# Patient Record
Sex: Female | Born: 1935 | Race: White | Hispanic: No | Smoking: Former smoker
Health system: Southern US, Community
[De-identification: ages and names within clinical notes are randomized; demographics above are authoritative.]

## PROBLEM LIST (undated history)

## (undated) DIAGNOSIS — I639 Cerebral infarction, unspecified: Secondary | ICD-10-CM

## (undated) DIAGNOSIS — IMO0001 Reserved for inherently not codable concepts without codable children: Secondary | ICD-10-CM

## (undated) DIAGNOSIS — Z9289 Personal history of other medical treatment: Secondary | ICD-10-CM

## (undated) DIAGNOSIS — Z87440 Personal history of urinary (tract) infections: Secondary | ICD-10-CM

## (undated) DIAGNOSIS — M1711 Unilateral primary osteoarthritis, right knee: Secondary | ICD-10-CM

## (undated) DIAGNOSIS — K5900 Constipation, unspecified: Secondary | ICD-10-CM

## (undated) DIAGNOSIS — H9190 Unspecified hearing loss, unspecified ear: Secondary | ICD-10-CM

## (undated) DIAGNOSIS — I10 Essential (primary) hypertension: Secondary | ICD-10-CM

## (undated) DIAGNOSIS — IMO0002 Reserved for concepts with insufficient information to code with codable children: Secondary | ICD-10-CM

## (undated) DIAGNOSIS — N200 Calculus of kidney: Secondary | ICD-10-CM

## (undated) DIAGNOSIS — E7800 Pure hypercholesterolemia, unspecified: Secondary | ICD-10-CM

## (undated) HISTORY — PX: LITHOTRIPSY: SUR834

## (undated) HISTORY — PX: PEG PLACEMENT: SHX5437

## (undated) HISTORY — PX: APPENDECTOMY: SHX54

## (undated) HISTORY — PX: PEG TUBE REMOVAL: SHX2187

## (undated) HISTORY — PX: TONSILLECTOMY: SUR1361

## (undated) HISTORY — PX: OTHER SURGICAL HISTORY: SHX169

## (undated) HISTORY — PX: TYMPANOPLASTY: SHX33

## (undated) SURGERY — OPEN REDUCTION INTERNAL FIXATION HIP
Anesthesia: Choice | Laterality: Right

## (undated) SURGERY — Surgical Case
Anesthesia: *Unknown

---

## 2004-07-05 ENCOUNTER — Ambulatory Visit (HOSPITAL_COMMUNITY): Admission: RE | Admit: 2004-07-05 | Discharge: 2004-07-05 | Payer: Self-pay | Admitting: Pulmonary Disease

## 2005-07-10 ENCOUNTER — Ambulatory Visit (HOSPITAL_COMMUNITY): Admission: RE | Admit: 2005-07-10 | Discharge: 2005-07-10 | Payer: Self-pay | Admitting: Pulmonary Disease

## 2008-11-23 ENCOUNTER — Ambulatory Visit: Payer: Self-pay | Admitting: Physical Medicine & Rehabilitation

## 2008-11-23 ENCOUNTER — Inpatient Hospital Stay (HOSPITAL_COMMUNITY)
Admission: RE | Admit: 2008-11-23 | Discharge: 2008-11-27 | Payer: Self-pay | Admitting: Physical Medicine & Rehabilitation

## 2008-11-27 ENCOUNTER — Inpatient Hospital Stay (HOSPITAL_COMMUNITY): Admission: EM | Admit: 2008-11-27 | Discharge: 2008-11-30 | Payer: Self-pay | Admitting: Internal Medicine

## 2008-11-30 ENCOUNTER — Inpatient Hospital Stay (HOSPITAL_COMMUNITY)
Admission: RE | Admit: 2008-11-30 | Discharge: 2008-12-24 | Payer: Self-pay | Admitting: Physical Medicine & Rehabilitation

## 2008-12-12 ENCOUNTER — Ambulatory Visit: Payer: Self-pay | Admitting: Physical Medicine & Rehabilitation

## 2008-12-29 ENCOUNTER — Encounter (HOSPITAL_COMMUNITY)
Admission: RE | Admit: 2008-12-29 | Discharge: 2009-01-28 | Payer: Self-pay | Admitting: Physical Medicine & Rehabilitation

## 2009-01-11 ENCOUNTER — Encounter: Payer: Self-pay | Admitting: Physical Medicine & Rehabilitation

## 2009-01-20 ENCOUNTER — Encounter
Admission: RE | Admit: 2009-01-20 | Discharge: 2009-04-20 | Payer: Self-pay | Admitting: Physical Medicine & Rehabilitation

## 2009-01-21 ENCOUNTER — Ambulatory Visit: Payer: Self-pay | Admitting: Orthopedic Surgery

## 2009-01-21 DIAGNOSIS — IMO0002 Reserved for concepts with insufficient information to code with codable children: Secondary | ICD-10-CM | POA: Insufficient documentation

## 2009-01-21 DIAGNOSIS — M25469 Effusion, unspecified knee: Secondary | ICD-10-CM | POA: Insufficient documentation

## 2009-01-21 DIAGNOSIS — M25569 Pain in unspecified knee: Secondary | ICD-10-CM | POA: Insufficient documentation

## 2009-01-21 DIAGNOSIS — M1710 Unilateral primary osteoarthritis, unspecified knee: Secondary | ICD-10-CM

## 2009-01-26 ENCOUNTER — Ambulatory Visit: Payer: Self-pay | Admitting: Physical Medicine & Rehabilitation

## 2009-01-29 ENCOUNTER — Encounter (HOSPITAL_COMMUNITY)
Admission: RE | Admit: 2009-01-29 | Discharge: 2009-03-02 | Payer: Self-pay | Admitting: Physical Medicine & Rehabilitation

## 2009-02-04 ENCOUNTER — Ambulatory Visit: Payer: Self-pay | Admitting: Orthopedic Surgery

## 2009-03-04 ENCOUNTER — Encounter (HOSPITAL_COMMUNITY)
Admission: RE | Admit: 2009-03-04 | Discharge: 2009-03-04 | Payer: Self-pay | Admitting: Physical Medicine & Rehabilitation

## 2009-03-05 ENCOUNTER — Encounter (HOSPITAL_COMMUNITY)
Admission: RE | Admit: 2009-03-05 | Discharge: 2009-03-05 | Payer: Self-pay | Admitting: Physical Medicine & Rehabilitation

## 2009-03-08 ENCOUNTER — Encounter (HOSPITAL_COMMUNITY)
Admission: RE | Admit: 2009-03-08 | Discharge: 2009-04-07 | Payer: Self-pay | Admitting: Physical Medicine & Rehabilitation

## 2009-03-18 ENCOUNTER — Ambulatory Visit: Payer: Self-pay | Admitting: Physical Medicine & Rehabilitation

## 2009-04-08 ENCOUNTER — Encounter (HOSPITAL_COMMUNITY)
Admission: RE | Admit: 2009-04-08 | Discharge: 2009-05-08 | Payer: Self-pay | Admitting: Physical Medicine & Rehabilitation

## 2009-04-20 ENCOUNTER — Encounter
Admission: RE | Admit: 2009-04-20 | Discharge: 2009-06-02 | Payer: Self-pay | Admitting: Physical Medicine & Rehabilitation

## 2009-04-20 ENCOUNTER — Ambulatory Visit: Payer: Self-pay | Admitting: Physical Medicine & Rehabilitation

## 2009-05-11 ENCOUNTER — Encounter (HOSPITAL_COMMUNITY)
Admission: RE | Admit: 2009-05-11 | Discharge: 2009-06-04 | Payer: Self-pay | Admitting: Physical Medicine & Rehabilitation

## 2009-06-08 ENCOUNTER — Encounter (HOSPITAL_COMMUNITY)
Admission: RE | Admit: 2009-06-08 | Discharge: 2009-07-08 | Payer: Self-pay | Admitting: Physical Medicine & Rehabilitation

## 2009-06-30 ENCOUNTER — Encounter
Admission: RE | Admit: 2009-06-30 | Discharge: 2009-09-28 | Payer: Self-pay | Admitting: Physical Medicine & Rehabilitation

## 2009-07-02 ENCOUNTER — Ambulatory Visit: Payer: Self-pay | Admitting: Physical Medicine & Rehabilitation

## 2009-07-09 ENCOUNTER — Encounter (HOSPITAL_COMMUNITY)
Admission: RE | Admit: 2009-07-09 | Discharge: 2009-10-01 | Payer: Self-pay | Admitting: Physical Medicine & Rehabilitation

## 2009-08-26 ENCOUNTER — Ambulatory Visit: Payer: Self-pay | Admitting: Physical Medicine & Rehabilitation

## 2009-10-05 ENCOUNTER — Encounter (HOSPITAL_COMMUNITY)
Admission: RE | Admit: 2009-10-05 | Discharge: 2009-11-04 | Payer: Self-pay | Admitting: Physical Medicine & Rehabilitation

## 2009-10-13 ENCOUNTER — Ambulatory Visit: Payer: Self-pay | Admitting: Vascular Surgery

## 2009-10-13 ENCOUNTER — Encounter (INDEPENDENT_AMBULATORY_CARE_PROVIDER_SITE_OTHER): Payer: Self-pay | Admitting: Interventional Radiology

## 2009-10-13 ENCOUNTER — Ambulatory Visit (HOSPITAL_COMMUNITY): Admission: RE | Admit: 2009-10-13 | Discharge: 2009-10-13 | Payer: Self-pay | Admitting: Interventional Radiology

## 2009-10-25 ENCOUNTER — Encounter
Admission: RE | Admit: 2009-10-25 | Discharge: 2009-10-28 | Payer: Self-pay | Admitting: Physical Medicine & Rehabilitation

## 2009-10-28 ENCOUNTER — Ambulatory Visit: Payer: Self-pay | Admitting: Physical Medicine & Rehabilitation

## 2009-11-16 ENCOUNTER — Encounter (HOSPITAL_COMMUNITY)
Admission: RE | Admit: 2009-11-16 | Discharge: 2009-12-16 | Payer: Self-pay | Admitting: Physical Medicine & Rehabilitation

## 2009-12-17 ENCOUNTER — Encounter (HOSPITAL_COMMUNITY)
Admission: RE | Admit: 2009-12-17 | Discharge: 2010-01-16 | Payer: Self-pay | Admitting: Physical Medicine & Rehabilitation

## 2010-01-18 ENCOUNTER — Encounter (HOSPITAL_COMMUNITY)
Admission: RE | Admit: 2010-01-18 | Discharge: 2010-02-17 | Payer: Self-pay | Admitting: Physical Medicine & Rehabilitation

## 2010-01-21 ENCOUNTER — Ambulatory Visit: Payer: Self-pay | Admitting: Physical Medicine & Rehabilitation

## 2010-01-21 ENCOUNTER — Encounter
Admission: RE | Admit: 2010-01-21 | Discharge: 2010-04-21 | Payer: Self-pay | Admitting: Physical Medicine & Rehabilitation

## 2010-06-27 ENCOUNTER — Encounter: Payer: Self-pay | Admitting: Physical Medicine & Rehabilitation

## 2010-09-11 LAB — BASIC METABOLIC PANEL
BUN: 12 mg/dL (ref 6–23)
BUN: 9 mg/dL (ref 6–23)
CO2: 26 mEq/L (ref 19–32)
CO2: 27 mEq/L (ref 19–32)
CO2: 28 mEq/L (ref 19–32)
Calcium: 8.8 mg/dL (ref 8.4–10.5)
Chloride: 103 mEq/L (ref 96–112)
Chloride: 104 mEq/L (ref 96–112)
Creatinine, Ser: 0.81 mg/dL (ref 0.4–1.2)
GFR calc Af Amer: >60 mL/min (ref >60)
GFR calc non Af Amer: 57 mL/min — ABNORMAL LOW (ref >60)
GFR calc non Af Amer: >60 mL/min (ref >60)
Glucose, Bld: 110 mg/dL — ABNORMAL HIGH (ref 70–99)
Glucose, Bld: 120 mg/dL — ABNORMAL HIGH (ref 70–99)
Potassium: 3.3 mEq/L — ABNORMAL LOW (ref 3.5–5.1)
Potassium: 3.5 mEq/L (ref 3.5–5.1)
Sodium: 138 mEq/L (ref 135–145)
Sodium: 141 mEq/L (ref 135–145)

## 2010-09-11 LAB — GLUCOSE, CAPILLARY
Glucose-Capillary: 123 mg/dL — ABNORMAL HIGH (ref 70–99)
Glucose-Capillary: 107 mg/dL — ABNORMAL HIGH (ref 70–99)
Glucose-Capillary: 110 mg/dL — ABNORMAL HIGH (ref 70–99)
Glucose-Capillary: 114 mg/dL — ABNORMAL HIGH (ref 70–99)
Glucose-Capillary: 116 mg/dL — ABNORMAL HIGH (ref 70–99)
Glucose-Capillary: 117 mg/dL — ABNORMAL HIGH (ref 70–99)
Glucose-Capillary: 120 mg/dL — ABNORMAL HIGH (ref 70–99)
Glucose-Capillary: 120 mg/dL — ABNORMAL HIGH (ref 70–99)
Glucose-Capillary: 121 mg/dL — ABNORMAL HIGH (ref 70–99)
Glucose-Capillary: 131 mg/dL — ABNORMAL HIGH (ref 70–99)
Glucose-Capillary: 136 mg/dL — ABNORMAL HIGH (ref 70–99)
Glucose-Capillary: 151 mg/dL — ABNORMAL HIGH (ref 70–99)
Glucose-Capillary: 151 mg/dL — ABNORMAL HIGH (ref 70–99)
Glucose-Capillary: 159 mg/dL — ABNORMAL HIGH (ref 70–99)
Glucose-Capillary: 99 mg/dL (ref 70–99)

## 2010-09-11 LAB — CBC
HCT: 35.7 % — ABNORMAL LOW (ref 36.0–46.0)
Hemoglobin: 11.9 g/dL — ABNORMAL LOW (ref 12.0–15.0)
Hemoglobin: 12.3 g/dL (ref 12.0–15.0)
MCHC: 33.4 g/dL (ref 30.0–36.0)
MCHC: 33.8 g/dL (ref 30.0–36.0)
MCHC: 34.6 g/dL (ref 30.0–36.0)
MCV: 83.1 fL (ref 78.0–100.0)
MCV: 84.1 fL (ref 78.0–100.0)
Platelets: 348 10*3/uL (ref 150–400)
RBC: 4.24 MIL/uL (ref 3.87–5.11)
RBC: 4.32 MIL/uL (ref 3.87–5.11)
RDW: 12.9 % (ref 11.5–15.5)
WBC: 8.6 10*3/uL (ref 4.0–10.5)

## 2010-09-11 LAB — DIFFERENTIAL
Basophils Absolute: 0 10*3/uL (ref 0.0–0.1)
Basophils Relative: 1 % (ref 0–1)
Eosinophils Absolute: 0.3 10*3/uL (ref 0.0–0.7)
Eosinophils Relative: 3 % (ref 0–5)
Neutrophils Relative %: 71 % (ref 43–77)

## 2010-09-12 LAB — GLUCOSE, CAPILLARY
Glucose-Capillary: 115 mg/dL — ABNORMAL HIGH (ref 70–99)
Glucose-Capillary: 120 mg/dL — ABNORMAL HIGH (ref 70–99)
Glucose-Capillary: 120 mg/dL — ABNORMAL HIGH (ref 70–99)
Glucose-Capillary: 121 mg/dL — ABNORMAL HIGH (ref 70–99)
Glucose-Capillary: 123 mg/dL — ABNORMAL HIGH (ref 70–99)
Glucose-Capillary: 126 mg/dL — ABNORMAL HIGH (ref 70–99)
Glucose-Capillary: 128 mg/dL — ABNORMAL HIGH (ref 70–99)
Glucose-Capillary: 133 mg/dL — ABNORMAL HIGH (ref 70–99)
Glucose-Capillary: 136 mg/dL — ABNORMAL HIGH (ref 70–99)
Glucose-Capillary: 143 mg/dL — ABNORMAL HIGH (ref 70–99)
Glucose-Capillary: 144 mg/dL — ABNORMAL HIGH (ref 70–99)
Glucose-Capillary: 177 mg/dL — ABNORMAL HIGH (ref 70–99)
Glucose-Capillary: 92 mg/dL (ref 70–99)
Glucose-Capillary: 96 mg/dL (ref 70–99)

## 2010-09-12 LAB — DIFFERENTIAL
Basophils Relative: 1 % (ref 0–1)
Eosinophils Absolute: 0.2 10*3/uL (ref 0.0–0.7)
Eosinophils Absolute: 0.2 10*3/uL (ref 0.0–0.7)
Lymphocytes Relative: 11 % — ABNORMAL LOW (ref 12–46)
Lymphocytes Relative: 13 % (ref 12–46)
Lymphs Abs: 1.1 10*3/uL (ref 0.7–4.0)
Lymphs Abs: 1.6 10*3/uL (ref 0.7–4.0)
Monocytes Absolute: 0.9 10*3/uL (ref 0.1–1.0)
Monocytes Relative: 11 % (ref 3–12)
Monocytes Relative: 8 % (ref 3–12)
Neutro Abs: 11 10*3/uL — ABNORMAL HIGH (ref 1.7–7.7)
Neutro Abs: 5.4 10*3/uL (ref 1.7–7.7)
Neutrophils Relative %: 75 % (ref 43–77)
Neutrophils Relative %: 79 % — ABNORMAL HIGH (ref 43–77)

## 2010-09-12 LAB — COMPREHENSIVE METABOLIC PANEL
ALT: 29 U/L (ref 0–35)
Albumin: 2.7 g/dL — ABNORMAL LOW (ref 3.5–5.2)
Alkaline Phosphatase: 64 U/L (ref 39–117)
Alkaline Phosphatase: 85 U/L (ref 39–117)
BUN: 18 mg/dL (ref 6–23)
BUN: 30 mg/dL — ABNORMAL HIGH (ref 6–23)
BUN: 9 mg/dL (ref 6–23)
CO2: 27 mEq/L (ref 19–32)
Calcium: 7.8 mg/dL — ABNORMAL LOW (ref 8.4–10.5)
Calcium: 8.8 mg/dL (ref 8.4–10.5)
Creatinine, Ser: 0.65 mg/dL (ref 0.4–1.2)
Creatinine, Ser: 0.97 mg/dL (ref 0.4–1.2)
GFR calc non Af Amer: >60 mL/min (ref >60)
Glucose, Bld: 118 mg/dL — ABNORMAL HIGH (ref 70–99)
Glucose, Bld: 125 mg/dL — ABNORMAL HIGH (ref 70–99)
Potassium: 3.6 mEq/L (ref 3.5–5.1)
Sodium: 140 mEq/L (ref 135–145)
Total Protein: 5.4 g/dL — ABNORMAL LOW (ref 6.0–8.3)
Total Protein: 6.2 g/dL (ref 6.0–8.3)

## 2010-09-12 LAB — URINALYSIS, ROUTINE W REFLEX MICROSCOPIC
Glucose, UA: NEGATIVE mg/dL
Ketones, ur: NEGATIVE mg/dL
Nitrite: NEGATIVE
Protein, ur: NEGATIVE mg/dL
Protein, ur: NEGATIVE mg/dL
Specific Gravity, Urine: 1.019 (ref 1.005–1.030)
Urobilinogen, UA: 1 mg/dL (ref 0.0–1.0)
Urobilinogen, UA: 1 mg/dL (ref 0.0–1.0)

## 2010-09-12 LAB — HEMOGLOBIN AND HEMATOCRIT, BLOOD
HCT: 31.5 % — ABNORMAL LOW (ref 36.0–46.0)
HCT: 34.6 % — ABNORMAL LOW (ref 36.0–46.0)
Hemoglobin: 10.7 g/dL — ABNORMAL LOW (ref 12.0–15.0)
Hemoglobin: 10.8 g/dL — ABNORMAL LOW (ref 12.0–15.0)
Hemoglobin: 11 g/dL — ABNORMAL LOW (ref 12.0–15.0)
Hemoglobin: 11.7 g/dL — ABNORMAL LOW (ref 12.0–15.0)

## 2010-09-12 LAB — BASIC METABOLIC PANEL
BUN: 25 mg/dL — ABNORMAL HIGH (ref 6–23)
CO2: 25 mEq/L (ref 19–32)
Calcium: 8.6 mg/dL (ref 8.4–10.5)
Chloride: 104 mEq/L (ref 96–112)
Chloride: 110 mEq/L (ref 96–112)
Creatinine, Ser: 0.79 mg/dL (ref 0.4–1.2)
Creatinine, Ser: 0.84 mg/dL (ref 0.4–1.2)
GFR calc Af Amer: >60 mL/min (ref >60)
GFR calc Af Amer: >60 mL/min (ref >60)
GFR calc non Af Amer: >60 mL/min (ref >60)

## 2010-09-12 LAB — CBC
HCT: 31.3 % — ABNORMAL LOW (ref 36.0–46.0)
HCT: 32.8 % — ABNORMAL LOW (ref 36.0–46.0)
Hemoglobin: 10.6 g/dL — ABNORMAL LOW (ref 12.0–15.0)
Hemoglobin: 11.3 g/dL — ABNORMAL LOW (ref 12.0–15.0)
MCHC: 32.9 g/dL (ref 30.0–36.0)
MCHC: 33.7 g/dL (ref 30.0–36.0)
MCHC: 33.8 g/dL (ref 30.0–36.0)
MCHC: 34.5 g/dL (ref 30.0–36.0)
MCV: 82.5 fL (ref 78.0–100.0)
MCV: 83 fL (ref 78.0–100.0)
MCV: 83.6 fL (ref 78.0–100.0)
MCV: 84.1 fL (ref 78.0–100.0)
MCV: 84.7 fL (ref 78.0–100.0)
Platelets: 297 10*3/uL (ref 150–400)
Platelets: 307 10*3/uL (ref 150–400)
Platelets: 394 10*3/uL (ref 150–400)
RBC: 3.93 MIL/uL (ref 3.87–5.11)
RBC: 3.96 MIL/uL (ref 3.87–5.11)
RBC: 4.36 MIL/uL (ref 3.87–5.11)
RDW: 12.3 % (ref 11.5–15.5)
RDW: 12.6 % (ref 11.5–15.5)
WBC: 13.9 10*3/uL — ABNORMAL HIGH (ref 4.0–10.5)
WBC: 8.9 10*3/uL (ref 4.0–10.5)

## 2010-09-12 LAB — APTT: aPTT: 26 seconds (ref 24–37)

## 2010-09-12 LAB — CROSSMATCH: Antibody Screen: NEGATIVE

## 2010-09-12 LAB — PROTIME-INR
INR: 1.1 (ref 0.00–1.49)
Prothrombin Time: 13.6 seconds (ref 11.6–15.2)
Prothrombin Time: 14 seconds (ref 11.6–15.2)

## 2010-09-12 LAB — URINE MICROSCOPIC-ADD ON

## 2010-09-12 LAB — URINE CULTURE
Colony Count: NO GROWTH
Special Requests: NEGATIVE

## 2010-09-12 LAB — MAGNESIUM: Magnesium: 2.3 mg/dL (ref 1.5–2.5)

## 2010-10-18 NOTE — Discharge Summary (Signed)
[name], [name]             ACCOUNT NO.:  [id number]   MEDICAL RECORD NO.:  [id number]          PATIENT TYPE:  INP   LOCATION:  3316                         FACILITY:  [facility]   PHYSICIAN:  Hind I Elsaid, MD      DATE OF BIRTH:  April 09, 1935   DATE OF ADMISSION:  11/27/2008  DATE OF DISCHARGE:  11/30/2008                               DISCHARGE SUMMARY   The patient will be discharged to [facility].   DISCHARGE DIAGNOSES:  1. PEG site bleeding.  2. Acute blood loss anemia, status post blood transfusion secondary to      PEG site bleeding.  3. 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  4. Hypertension.  5. Left internal carotid artery stenosis with 99% occlusion and 40%      stenosis of the right internal carotid artery.  6. Hyperlipidemia.  7. History of kidney stone.  8. History of gradual hearing loss.   MEDICATIONS:  1. Crestor 20 mg p.o. at bedtime.  2. Lisinopril 10 mg daily.  3. Zetia 10 mg daily.  4. Plavix 75 mg daily.  5. Prozac 5 mg daily.  6. Lopressor 25 mg twice daily.  7. Dulcolax 10 mg as needed for constipation.  8. Maalox 30 mL as needed for indigestion.   Medication stopped, aspirin was discontinued secondary to PEG tube site  bleeding.   CONSULTATION:  Surgical consulted and Neurology consultation was done  during hospital stay.   PROCEDURE:  CT abdomen and pelvis with no acute findings in the pelvis  and no evidence of retroperitoneal hematoma, gastrostomy tube in a good  position without complicating feature, bilateral nephrolithiasis.   HISTORY OF PRESENT ILLNESS:  This is a 76 year old female who had large  left MCA infarct in [place] and was transferred to inpatient rehab  at [facility] on June 25, for physical therapy and rehab.  She had  residual aphasia, right side hemiplegia from her large left MCA infarct.  She also has 99% left internal carotid artery stenosis.   She had a PEG  tube inserted at [place] and being getting feeding tube.  During  physical therapy, the patient noted to have PEG site bleeding and  oozing.  The patient was transferred to ICU secondary to hypotension, is  status post IV fluid and blood transfusion.  All aspirin, Plavix, and  Lovenox were discontinued.  GI consulted who recommended social surgical  evaluation.  The patient was seen and examined by Surgery and no  aggressive intervention was done during hospital stay.  Neurology  consulted where they recommended to discontinue aspirin  and continue with the Plavix.  H and H continued to be monitored, okay  and feeding was started.  The patient kept under close observation with  no further bleeding.  At this time, we felt the patient is medically  stable to be transferred back.      Hind Franco Collet, MD  Electronically Signed     HIE/MEDQ  D:  11/30/2008  T:  11/30/2008  Job:  [id number]

## 2010-10-18 NOTE — H&P (Signed)
NAMEKARYSA, KASSAM NO.:  [id number]   MEDICAL RECORD NO.:  [id number]          PATIENT TYPE:  IPS   LOCATION:  4036                         FACILITY:  [facility]   PHYSICIAN:  Domenic Polite, MD     DATE OF BIRTH:  [birth year]   DATE OF ADMISSION:  11/23/2008  DATE OF DISCHARGE:  11/27/2008                              HISTORY & PHYSICAL   CHIEF COMPLAINT:  Upper GI bleed and request for transfer.   HISTORY OF PRESENT ILLNESS:  Ms. Lawwill is a 76 year old female who had  a large left MCA infarct in [place] recently and was transferred to  the inpatient rehab here at [facility] on June 21 for physical therapy  and rehab.  She has residual aphasia and right-sided hemiplegia from her  large left MCA infarct.  In addition, she was noted to have greater than  99% left internal carotid artery stenosis.  She also had her PEG tube  inserted about a week ago in [place] and had been getting PEG tube  feeds as well as recently started on p.o. diet after passing the swallow  eval.  Yesterday, she was noted to have small bright red blood oozing  from around the PEG site, which spontaneously stopped.  However, this  afternoon, while at physical therapy she had more pronounced oozing  around the PEG site, soaking about 4-5 gauzes, 4 x 4s.  In addition,  when the PEG tube was flushed with 500 mL of normal saline, 650 mL of  dark red blood was returned.  She has no history of hematemesis or  melena.  No prior GI bleeds.  Subsequently, her aspirin, Plavix, and  Lovenox were stopped this afternoon and Surgery was consulted who  subsequently placed a purse-string suture around the PEG tube site,  which has considerably decreased the bleeding from around the PEG.  In  addition, she has also had sandbags placed on her abdomen.   PAST MEDICAL HISTORY:  Hypertension, dyslipidemia, kidney stones, and  gradual hearing loss.   PAST SURGICAL HISTORY:  None.   CURRENT  MEDICATIONS:  1. Crestor 20 mg once a day.  2. Lisinopril 10 once a day.  3. Zetia 10 mg once a day.  4. Lovenox 40 mg subcu daily.  5. Plavix 75 once a day.  6. Prozac 5 mg once a day.  7. Lopressor 25 b.i.d.  8. Bethanechol 4 mg q.i.d.  9. Aspirin 81 once a day.  10.Dulcolax 10 mg as needed for constipation.  11.Maalox 30 mL as needed for indigestion.  12.Benadryl 12.5 mg as needed for itching.  13.Tylenol p.r.n.  In addition, has been getting Jevity 250 mL at bedtime.   ALLERGIES:  No known drug allergies.   SOCIAL HISTORY:  She is a retired Marine scientist.  Lives at home with her husband  who was a Immunologist here at [employer].  She is a former smoker, quit about  26 years ago, and drinks alcohol occasionally.   FAMILY HISTORY:  Significant for CVA.   REVIEW OF SYSTEMS:  12 systems reviewed are negative, except per HPI.  PHYSICAL EXAMINATION:  VITAL SIGNS:  Temperature is 98.3, pulse is 85,  blood pressure 133/81, respirations 22, sating 94% on room air.  GENERAL:  She is sleeping, arousable.  HEENT:  Pupils equal, reactive to light.  CARDIOVASCULAR:  S1, S2.  Regular rate, rhythm.  LUNGS:  Clear to auscultation bilaterally.  ABDOMEN:  With gauze and sandbag placed over the abdomen, which is soft,  nontender with positive bowel sounds.  In addition, PEG tube noted with  coffee-ground material.  EXTREMITIES:  No edema, clubbing, or cyanosis.   LABORATORY DATA:  CBC:  Hemoglobin 12.1, white count 13, platelets 394.  Chemistries within normal limits.  PT 14, INR 1.1, PTT is 26.   ASSESSMENT AND PLAN:  This is a 76 year old female with recent large  left middle cerebral artery infarct with:  1. Upper gastrointestinal bleed.  We will transfer her to telemetry      bed.  Unclear if the bleeding was from the surgical site versus      mucosal upper GI bleeding, is status post purse-string sutures per      surgery.  We will type and cross packed red blood cells.  Check H      and H q.8 h.   Transfuse to keep hemoglobin greater than 10.  Start      IV Protonix 40 mg IV b.i.d.  In addition, get large bore IV access      as well.  Continues to bleed, will likely need an EGD.  GI has      already been consulted and hold aspirin, Plavix, Lovenox for now      and hold tube feeds and keep the patient n.p.o. and transfer to the      inpatient telemetry bed.  2. Left large middle cerebral artery infarct related to right      hemiparesis and aphasia.  Hold aspirin, Lovenox for now secondary      to the bleeding; 99% left internal carotid artery occlusion, for      reevaluation in 3-4 weeks for possible carotid endarterectomy.      Neurology, Dr. Leonie Man, is following the patient.  3. Hypertension.  Hold lisinopril.  Continue Lopressor with holding      parameters.  4. Deep venous thrombosis prophylaxis.  PAS stockings.  5. Code status.  The patient is a full code.      Domenic Polite, MD  Electronically Signed     PJ/MEDQ  D:  11/27/2008  T:  11/28/2008  Job:  [id number]

## 2010-10-18 NOTE — H&P (Signed)
NAMEDAZARIA, ROSENBAUER NO.:  [id number]   MEDICAL RECORD NO.:  [id number]          PATIENT TYPE:  IPS   LOCATION:  [id number]                         FACILITY:  [facility]   PHYSICIAN:  Meredith Staggers, M.D.DATE OF BIRTH:  1934-12-20   DATE OF ADMISSION:  11/30/2008  DATE OF DISCHARGE:                              HISTORY & PHYSICAL   NEUROLOGIST:  Pramod P. Leonie Man, MD   GASTROENTEROLOGIST:  Tory Emerald. Benson Norway, MD   CHIEF COMPLAINT:  Right-sided weakness and aphasia.   HISTORY OF PRESENT ILLNESS:  This is a 76 year old white female admitted  initially on November 23, 2008 with extensive left basal ganglia infarct and  aphasia.  Please see details of my prior admission history and physical  for the history prior to rehab stay.  The patient was started on a D2  diet while on rehab with thin liquids on November 26, 2008.  The patient  developed bleeding from her PEG site on November 28, 2008 and was noted to  have coffee-ground/dark red blood tinged fluid from the PEG.  The area  was flushed initially.  GI and General Surgery was consulted and Surgery  placed a pursestring suture around the PEG, which decreased the  bleeding.  CT of the abdomen and pelvis was without evidence of  retroperitoneal bleeding or peritoneal bleeding.  The patient was  transferred to Telemetry for closer monitoring.  She did  require 2  units of packed red blood cells due to the blood loss.  The patient's  bleeding has since resolved, and H&H has been stable at mid 11 to 61.  Today, she was transferred back to rehab to resume her rehab therapies.  General Surgery now recommends aspirin or Plavix and no Lovenox for her  as prophylaxis.   REVIEW OF SYSTEMS:  Notable for some weakness and joint swelling.  Otherwise, review of systems items are unavailable due to patient's  language deficits.   Past medical history, family history, social history are all the same  from prior H&P.   ALLERGIES:  None.   HOME  MEDICATIONS:  Zestril and Crestor.   LABORATORY DATA:  Hemoglobin 11.1, white count 8.9, and platelets 297.  UA is positive 11-20 red blood cells, many bacteria, and white blood  cells were too numerous to count.   PHYSICAL EXAMINATION:  VITAL SIGNS:  Blood pressure is 131/74, pulse 61,  respiratory rate 20, temperature 98.0.  GENERAL:  The patient is pleasant.  HEENT:  Pupils equally round and reactive to light.  Nose and throat  exam is unremarkable with fair dentition.  NECK:  Supple without JVD or lymphadenopathy.  CHEST:  Clear to auscultation bilaterally without wheezes, rales, or  rhonchi.  HEART:  Regular rate and rhythm without murmur, rubs, or gallops.  ABDOMEN:  Soft, nontender.  Bowel sounds are positive.  No bleeding is  seen at or around the PEG site.  SKIN:  Generally intact except for above.  NEUROLOGICAL:  Cranial nerves II-XII show extensive right central VII  and tongue deviation, poor oral motor control.  She is  aphasic, more  expressive than  receptive.  Sensation is grossly intact to pinprick and  pain on the right.  Still has some inattention to the right.  In  general, strength is minimal to absent in the right arm and leg today,  at best 1/5 perhaps in the leg.  Left arm and leg move spontaneously and  generally in the 4/5 to 5/5 range.  Judgment, orientation, memory, and  mood are all abnormal due to language deficits.   POST-ADMISSION PHYSICIAN EVALUATION:  1. Functional deficits secondary to left basal ganglia infarct      secondary to left ICA stenosis.  Course complicated by bleed around      PEG.  2. The patient is admitted to receive collaborative interdisciplinary      care between the physiatrist, rehab, nursing staff, and therapy      team.  3. The patient's level of medical complexity and substantial therapy      needs in context of that medical necessity cannot be provided at a      lesser intensity of care.  4. The patient has experienced  substantial functional loss from her      baseline.  Upon functional assessment at the time of preadmission      screening, the patient was max assist for basic mobility and self-      care, max basic mobility, and max-to-total assistance for basic      self-care.  Premorbidly, she was independent.  Currently, the      patient is around max assist for mobility and max assist for ADLs.      Judging by the patient's diagnosis, physical exam, and functional      history, she has  potential for functional progress which will      result in measurable gains while in inpatient rehab.  These gains      will be of substantial practical use upon discharge to home with      her husband in facilitating mobility and self-care.  Interim      changes since our initial document are detailed above.  5. Physiatrist will provide 24-hour management of medical needs as      well as oversight of therapy plan/treatment and provide guidance as      appropriate regarding interaction of the two.  Medical problem list      and plan are listed below.  6. The 24-hour rehab nursing will assist in management of the      patient's bowel and bladder function as well as skin care, PEG site      care, nutrition, medication administration, integration of therapy      concepts and techniques, etc.  7. PT will assess and treat for lower extremity strength, tone, and      range of motion and control, neuromuscular facilitation,      reeducation, functional ability, family education, and goals of min      assist.  The patient will primarily be at a wheelchair level.  8. OT will assess and treat for upper extremity use including adaptive      techniques and equipment, neuromuscular reeducation, family      education, safety awareness with goals of min to occasional mod      assist at a wheelchair level.  9. Speech and Language Pathology will assess and treat for dysphagia      and aphasia with goals modified independent with  feeding and      supervision to min assist with language.  10.Case management social worker will assess and treat for      psychosocial issues and discharge planning.  11.Team conferences will be held weekly to assess progress towards      goals and to determine barriers at discharge.  12.The patient has demonstrated sufficient medical stability and      exercise capacity to tolerate at least 3 hours of therapy per day      at least 5 days per week.  13.Estimated length of stay is approximately 3 weeks.  Prognosis is      fair to good.   MEDICAL PROBLEM LIST AND PLAN:  1. Fluid, electrolytes, and nutrition:  Resume D2 thin, liquid diet.      Tube feeds will be given initially and then will stop.  Consider      resuming depending on caloric intake by mouth.  2. PEG site bleed:  H&H is stable.  Will refrain from aspirin.      Continue Plavix 75 mg p.o. daily.  3. DVT prophylaxis with SCDs.  4. Anemia:  The patient's hemoglobin stabilized around 11.  We will      follow hemoglobins serially and avoid Lovenox as above.  5. UTI:  The patient placed on IV Fortaz initially for questionable      sepsis.  We will discontinue and recheck      another UA and C&S.  Placed the patient on Cipro.  6. Dyslipidemia:  Crestor.  7. Skin care:  Frequent turning and decubitus precautions.      Meredith Staggers, M.D.  Electronically Signed     ZTS/MEDQ  D:  11/30/2008  T:  12/01/2008  Job:  [id number]   cc:   Pramod P. Leonie Man, MD  Tory Emerald Benson Norway, MD

## 2010-10-18 NOTE — Discharge Summary (Signed)
[name], [name] NO.:  [id number]   MEDICAL RECORD NO.:  [id number]          PATIENT TYPE:  IPS   LOCATION:  4036                         FACILITY:  [facility]   PHYSICIAN:  [name], M.D.DATE OF BIRTH:  1935/03/04   DATE OF ADMISSION:  11/30/2008  DATE OF DISCHARGE:  12/24/2008                               DISCHARGE SUMMARY   DISCHARGE DIAGNOSES:  1. 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  2. Dysphagia resolved.  3. Acute blood loss anemia improved.  4. Dyslipidemia.  5. Urinary retention resolved.   HISTORY OF PRESENT ILLNESS:  [name] is a 76 year old female  initially admitted 06/21 with extensive left basal ganglia infarct with  right hemiparesis and aphasia, see prior H&P for full details.  The  patient was started on D2 diet while on rehab with thin liquids on June  24, she developed some bleeding from trach site on June 26, was noted to  have coffee ground aspirate, tinge blood aspirated from PEG and  evaluated by GI and General Surgery.  General surgery placed a  pursestring suture around the PEG with decrease in bleeding.  CT  abdomen, pelvis done showed no evidence of retroperitoneal bleeding of  peritoneal bleeding.  The patient was transferred to telemetry for  closer monitoring.  She was transfused with 2 units packed red blood  cells for acute blood loss anemia.  The patient's H&H has been monitored  and has been stable at mid 11-12 range.  Bleeding has resolved and she  is transferred back to rehab on 06/28 for her continue her CIR program.  General surgery recommends aspirin or Plavix and no Lovenox for DVT  prophylaxis to prevent recurrent bleeding.   PAST MEDICAL HISTORY:  Significant for hypertension, dyslipidemia,  impaired hearing, renal calculi and OA, right knee.   ALLERGIES:  No known drug allergies.   FAMILY HISTORY:  Positive for CVA.   SOCIAL HISTORY:  The patient is married, is a retired  Marine scientist, independent  and active prior to admission, lives in two-level home with six steps at  entry.  Quit tobacco times 26 years.  Uses alcohol once a week.   FUNCTIONAL HISTORY:  The patient was independent prior to admission.   FUNCTIONAL STATUS:  The patient with impairments and mobility and self-  care.  Currently with resumption of CIR Program to take place.   PHYSICAL EXAMINATION:  VITALS:  Blood pressure 131/74, pulse 61,  respiratory rate 20, temperature 98.0  GENERAL:  The patient is pleasant female alert, in no acute distress.  HEENT:  Pupils equal, round, reactive to light.  Oral exam shows fair  dentition.  Hearing is reduced.  NECK:  Supple without JVD or lymphadenopathy.  CHEST:  Clear to auscultation bilaterally without wheezes, rales or  rhonchi.  HEART:  Regular rate rhythm without murmurs or gallops.  ABDOMEN:  Soft, nontender with positive bowel sounds.  No bleeding seen  around PEG site.  SKIN:  Intact.  NEUROLOGIC: Cranial nerves II-XII show extensive central VII with tongue  deviation, poor oral motor control.  The patient is  aphasic, more  expressive than receptive, sensation is grossly intact to pin prick and  pain still has some inattention to write.  Strength is minimal to  absent, right arm and right leg, 1/5 perhaps in leg the left arm and  left leg was spontaneously generally 2/5 to 5/5.  Judgment orientation,  memory and mood are abnormal due to language deficits.   HOSPITAL COURSE:  [name] was readmitted to rehab on November 30, 2008, for inpatient therapies to consist of PT, OT, speech therapy at  least 3 hours 5 days a week.  Past admission, physiatrist rehab RN and  therapy team have worked together to provide customized collaborative  interdisciplinary care.  The patient's diet was resumed and tube feeds  were deceased the past admission.  The patient was advanced to D2 diet  thin liquids and has been tolerating this without difficulty.   Labs done  past admission revealed hemoglobin 11.3, hematocrit 32.8, white count  8.5, platelets 303.  Check of lytes revealed, sodium 142, potassium 3.3  chloride 106, CO2 27, BUN 9, creatinine 0.65, glucose 118.  The patient  was started on potassium supplements for mild hypokalemia, question due  to IV fluids as well as poor p.o. intake.  Last check of lytes of July  12 show hypokalemia to have resolved with sodium 141, potassium 3.5,  chloride 105, CO2 20, BUN 12, creatinine 0.96, glucose 105.  Hemoglobin  A1c checked past admission is 5.4.  Initially CBGs were checked on a.c.  h.s. basis.  Blood sugars overall show glucose intolerance ranging at  120-130 range.  Currently the patient is on a regular diet and is to  follow up with LMD regarding monitoring of blood sugars and hemoglobin  A1c on routine basis.  The patient's blood pressures have been monitored  on b.i.d. basis.  These have ranged from [id number] systolic, [id number] to 123XX123  diastolic.  Heart rate has been stable in 60s to 70s range.  The  patient's Foley was DC on 06/29 and voiding trial was resumed.  The  patient was started on Urecholine to help initiate voiding.  She has  been toileted by rehab RN q.6 h to see if this could help trigger her  bladder.  Initially the patient required some in-and-out caths.  However, with improvement in mobility as well as addition of Flomax, the  patient's voiding has improved without any evidence of retention.  Last  PVRs checked were noted to be at 60s to 150 range.  Currently the  patient is continent bowel and bladder with rehab RN, toileting patient  q.4 h while awake.  As the patient's p.o. intake was noted to be good,  General Surgery was contacted regarding removal of suture from PEG site.  This was done on 07/20 without difficulty.  PEG was discontinued on  07/20 1:00 a.m.Marland Kitchen  Area does show some minimal amount of dried blood on  dressing.  The patient to continue with dry dressing on site  until the  drainage fully resolved.  The patient has had some improvement in right-  sided weakness.  Currently she is noted to have -2 out of 5 strength  right hand and right foot.  Verbal output is improving.   During the patient's stay in rehab physiatrist rehab RN and therapy team  have worked together to provide customized collaborative  interdisciplinary care.  Weekly team conferences were held to monitor  the patient's progress, set goals as well as discuss  barriers to  discharge.  Portion was noted to be limited initially by right  inattention, apraxia, right hemiparesis as well as some tone in right  lower extremity decreasing impacting her functional mobility.  The  patient has progressed along well with therapy, achieving supervision  for lateral school.  She is min assist for sit to stand, min assist for  ambulating 154 feet with rolling walker, min assist to navigate to  upstairs.  Tone is much improved.  However, some extensor tone persist.  Motor apraxia has resolved.  The patient's husband has been very  supportive and has been here to be present for most of her therapy  sessions.  Family education was done with husband to include all  mobility as well as wheelchair, parts management, car transfers as well  as routine transfers.  The patient was limited and self-care by decrease  in static and dynamic standing balance, decreased endurance, decrease in  selective sustained attention as well as problems with motor planning  and poor safety.  The patient has made excellent progress progressing  from max assist for self care to being at min assist overall.  She is  min assist for bathing and lower body dressing.  She requires setup for  upper body dressing and min assist for toileting.  Speech has been  working with the patient on improving her consistency in responding to  yes or  no questions.  They have also been working with melanotic  intonation to help verbalize one to  two word utterances as well as  improving her comprehension of her written short phrase information.  The patient has progressed along to expressing her wants and needs with  gestures as well as written cues and verbal approximation was  supervision.  She is able to utilize word, phrase, cues to increase  accuracy with yes, no auditory comprehension.  She is able to copy words  and letters, to fill in missing letters, to complete words with mod to  max cues.  She is able to identify words and phrases to match letters to  approximate [facility] and object with cues to correct and check for errors  in the field of 3 to 4 at 80-90% accuracy.  The patient and spouse  education was done regarding the patient's current needs for multimodel  and verbal communication.  He has also been educated on home program to  practice maximizing communication interaction to help the patient  expressed her wants and needs.  The patient will continue to receive  further follow-up outpatient PT, OT, speech therapy [facility]  to begin first part of August.  On December 24, 2008 the patient is  discharged to home.   DISCHARGE MEDICATIONS:  1. Lopressor 25 mg b.i.d.  2. Zetia 10 mg a day.  3. Plavix 75 mg a day.  4. Crestor 20 mg a day.  5. Protonix 40 mg a day.  6. Multivitamin one per day.  7. Senokot-S two p.o. q.h.s.   DIET:  Balanced.   ACTIVITY:  Level is 24-hour supervision.  No strenuous activity.  No  alcohol, no smoking, no driving.   FOLLOWUP:  The patient to follow up with Dr. Letta Pate on August 24 at  10:00 a.m. to 10:30 appointment.  Follow up with Dr. Leonie Man in 1 month.  Follow up with LMP in 2-3 weeks.      Thornton Dales, P.A.      [name], M.D.  Electronically Signed  PP/MEDQ  D:  12/24/2008  T:  12/25/2008  Job:  [id number]   cc:   Pramod P. Leonie Man, MD  [facility] [place], [place]. Dalbert Batman, M.D.

## 2010-10-18 NOTE — Consult Note (Signed)
NAMEGALILEA, RECLA             ACCOUNT NO.:  [id number]   MEDICAL RECORD NO.:  [id number]          PATIENT TYPE:  IPS   LOCATION:  4036                         FACILITY:  [facility]   PHYSICIAN:  Pramod P. Leonie Man, MD    DATE OF BIRTH:  11-Mar-1935   DATE OF CONSULTATION:  DATE OF DISCHARGE:                                 CONSULTATION   CHIEF COMPLAINT:  Right-sided weakness, aphagia, and dysphagia.   HISTORY OF PRESENT ILLNESS:  This is a 76 year old white woman with risk  factors of hypertension, hyperlipidemia, and history of tobacco abuse.  She was visiting family in [place] on November 16, 2008, that is about  8 days ago with her husband.  She was apparently alright until she went  to bed on that day.  When she woke up, husband noticed sudden onset  right-sided weakness and aphasia.  She was evaluated in [facility] and was deemed not suitable for thrombolysis.  She was found to  have diffuse left-sided infarction.  She was transferred to [facility] by ambulance yesterday and admitted for rehab.  Since November 16, 2008, she has had persistent deficits.  She is able to swallow some now  and has some phonation, but there is no meaningful conversation.  She  does have some right-sided movement, but seems still very weak.   ALLERGIES:  No known allergies.   PAST MEDICAL HISTORY:  1. Decreased hearing bilaterally.  2. Osteoarthritis of right knee  3. Hypertension.  4. Hyperlipidemia.  5. Kidney stones.   CURRENT MEDICATIONS:  1. Aspirin 81.  2. Plavix 75.  3. Zetia 10.  4. Crestor 20.  5. Lisinopril 10.  6. Metoprolol 25 twice a day.  7. Lovenox subcutaneous daily.  8. Prozac.   SOCIAL HISTORY:  Lives with husband.  She is a retired Marine scientist.  History  of tobacco abuse, quit about 26 years ago.  No history of alcohol or  drug abuse.   FAMILY HISTORY:  She has a family history of CVA.   REVIEW OF SYSTEMS:  Negative except per HPI.   PHYSICAL EXAMINATION:   VITAL SIGNS:  Temperature 98.9, heart rate 71,  respiratory rate 18, and blood pressure 131/77.  GENERAL:  Not in any distress, sitting on a wheelchair.  HEENT:  Normocephalic and atraumatic.  Extraocular movements are intact.  Pupils are equal, round, and reactive to light.  NECK:  Supple.  No carotid bruits.  CHEST:  Clear to auscultation bilaterally.  CARDIOVASCULAR:  Regular rate and rhythm with normal heart sounds.  ABDOMEN:  Soft, nontender, and nondistended.  EXTREMITIES:  No cyanosis, clubbing, or edema.  NEURO:  Alert but aphasic.  Right-sided facial droop.  Motor exam 2-3/5  on the right side, left side 4/5.  Reflexes symmetrical but diminished  in lower extremities.  Babinski is upgoing bilaterally.  Unable to  assess cerebellar and sensory.   LABORATORY DATA:  1. CT of heat on November 16, 2008, left MCA stroke with old left carotid      stroke.  2. MRI of brain, extensive left basal ganglia  infarction, left      temporal lobe involving two-third.  No flow noticed in left ICA and      left MCA.  3. Carotid Doppler, right ICA 79% and left ICA 80% stenosis.  4. Hemoglobin 11.8, WBC 8.3, and platelet 207.  LFT normal except for      albumin of 2.7, sodium 140, potassium 3.6, chloride 106,      bicarbonate 26, BUN 18, creatinine 0.8, and blood glucose 100.   ASSESSMENT AND PLAN:  This is a 76 year old lady with recent large left  middle cerebral artery infarction from left middle cerebral artery clot  and possible embolization from the proximal left internal carotid artery  80% stenosis.  She is globally aphasic and has some right-sided visual  field cut and right-sided facial weakness.  Her MRI has shown large left  temporal and basal ganglia infarct.  She noted to have bilateral carotid  stenosis up to about 80%.   She will basically need secondary risk factor prevention.  We agree with  continue with Plavix and aspirin for now.  Her Plavix might have to be  placed on hold  if she goes for any intervention.  We agree with  continuing with Zetia and Crestor for her hyperlipidemia.  We agree with  correcting her blood pressure with lisinopril and metoprolol.  We  recommend getting a cerebral angiogram to assess blood supply to the  brain.  Following cerebellar angiogram, we will consider a vascular  consult for endarterectomy.  We agree with continuing with PT, OT, and  rehab at this time.  Swallow evaluation is to be done if it is not  already done.   Thank you very much for this interesting consult.  We will follow along  closely with you.      Dawna Part, MD  Electronically Signed     ______________________________  Kathie Rhodes. Leonie Man, MD    AS/MEDQ  D:  11/24/2008  T:  11/25/2008  Job:  [id number]

## 2010-10-18 NOTE — H&P (Signed)
[name], [name] NO.:  [id number]   MEDICAL RECORD NO.:  [id number]          PATIENT TYPE:  IPS   LOCATION:  [id number]                         FACILITY:  [facility]   PHYSICIAN:  Meredith Staggers, M.D.DATE OF BIRTH:  1934-07-05   DATE OF ADMISSION:  11/23/2008  DATE OF DISCHARGE:                              HISTORY & PHYSICAL   CHIEF COMPLAINT:  Aphasia and right-sided weakness.   HISTORY OF PRESENT ILLNESS:  This is a 76 year old previously active  white female who was in [place] visiting family when on June 14, she  developed right-sided weakness and aphasia.  CAT scan of the brain  showed hyperdense signal in left MCA territory consistent with acute  thrombus and an old left caudate infarct.  MRI of the brain showed  extensive infarct in left basal ganglia and two-thirds left temporal  lobe in circle of Willis.  No flow was seen in the left ICA or left MCA  in its M1 and M2 segments.  Carotid Dopplers showed 79% right ICA  stenosis and 80% left ICA.  The patient with persistent global aphasia  and dysphagia as well as dense right hemiparesis.  She had a PEG tube  placed during this hospital stay by GI and has been tolerating feedings  fairly well.  The patient continues to have significant deficits with  mobility and self care and it was felt that she potentially benefit from  inpatient admission and thus she is brought via air ambulance here  today.   REVIEW OF SYSTEMS:  Unable to be obtained due to patient's language  deficits.  She does not appear to be in any gross pain at this point.   PAST MEDICAL HISTORY:  Notable for decreased hearing with long-term  gradual hearing loss.  The patient wears hearing aid.  There is  osteoarthritis of right knee, hypertension, dyslipidemia, and kidney  stones.   FAMILY HISTORY:  Positive for CVA.   SOCIAL HISTORY:  The patient is a retired Marine scientist.  She quit tobacco 26  years ago and rarely drinks.  She has a 2-level  house with 6 steps to  enter.  Bedroom is on the first floor.   ALLERGIES:  None.   HOME MEDICATIONS:  Zestril and Crestor.   PHYSICAL EXAMINATION:  GENERAL:  The patient is pleasant, no acute  stress.  She is alert and unable to orient due to language.  HEENT:  Pupils equal, round, and reactive to light.  Ear, nose, and  throat exam is notable for intact to fair dentition and mucosa is pink  and moist.  NECK:  Supple.  No JVD or lymphadenopathy.  CHEST:  Clear to auscultation bilaterally without any wheezes, rales, or  rhonchi.  HEART:  Regular rate and rhythm without murmur, rubs, or gallop.  EXTREMITIES:  No clubbing, cyanosis, or edema.  ABDOMEN:  Notable for PEG tube which is intact.  The bumper was a bit  snug which I loosened.  There is some irritation of the skin due to  tight fitting bumper.  Otherwise, skin was intact as far as we could  see.  NEUROLOGICAL:  Notable for right central VII and tongue deviation, poor  oromotor control overall.  Reflexes are 1+ on the right and left today.  Sensation was still present for pain sensation in the right arm and leg  today.  Unable to assess judgment, orientation, memory, and mood.  She  was a bit flat, however.  She is globally aphasic, bit more expressively  aphasic, receptively.  It may be some apraxia of verbal and motor  function.  The patient is overweight.   POST ADMISSION PHYSICIAN EVALUATION:  1. Functional deficits secondary to large thromboembolic stroke in      left MCA distribution.  Potential sources is the left ICA.  The      patient with severe right ICA stenosis also.  2. The patient is admitted to receive collaborative interdisciplinary      care between the physiatrist, rehab nursing staff, and therapy      team.  3. The patient's level of medical complexity and substantial therapy      needs in context of that medical necessity cannot be provided at a      lesser intensity of care.  4. The patient has  experienced substantial functional loss from her      baseline.  Premorbidly, she was independent.  As of the most recent      therapy evaluation, she is max assist bed mobility and transfers.      The patient is able to assist with support.  She has a hard time      weightbearing through right leg for any type of transferring.  She      is max and total assistance with ADLs.  Judging by the patient's      diagnosis, physical exam, and functional history, she has the      potential for functional progress which will result in measurable      gains while on rehab.  These gains will be of substantial and      practical use allowing her husband to care for her at home upon      discharge.  5. Physiatrist will provide 24-hour management of medical needs as      well as oversight of the therapy plan/treatment and provide      guidance as appropriate regarding interaction of the two.  Medical      problem list and plan listed below.  6. A 24-hour rehab nursing will assist in management of the patient's      skin care needs as well as nutrition, pain management, medication      administration, safety, and integration of therapy concepts and      techniques as well as bowel and bladder function.  7. PT will assess and treat for lower extremity strength,      neuromuscular reeducation, balance, functional mobility,      essentially low-level gait and adaptive equipment and family      education with goals min assist.  8. OT will assess and treat for upper extremity use and ADLs as well      as safety awareness, adaptive equipment, cognitive perceptual      training, and family education with goals min to mod assist.  9. Speech language pathology will assess and treat for severe language      deficits including aphasia and apraxia as well as severe dysphagia.      Goals are supervision to min assist.  10.Case management and social worker will  assess and treat for      psychosocial issues and  discharge planning.  11.Team conferences will be held weekly to assess progress towards      goals and to determine barriers to discharge.  12.The patient has demonstrated sufficient medical stability and      exercise capacity to tolerate at least 3 hours of therapy per day      at least 5 days per week.  13.Estimated length of stay is 3-4 weeks.  Prognosis is fair to good.   MEDICAL PROBLEM LIST AND PLAN:  1. Anticoagulation:  The patient was sent here on aspirin despite this      large stroke and severe right ICA disease.  We will change to      Plavix and baby aspirin and ask Neurology for further      recommendations for anticoagulation and workup.  I think that an      echocardiogram is in order as well as perhaps cerebral angiogram.      The patient likely needs a right CEA in the future.  2. DVT prophylaxis with subcu heparin 5000 units q.8 h.  3. Hypertension:  Lopressor 25 mg b.i.d. and lisinopril 10 mg daily.  4. Dyslipidemia:  Crestor 20 mg at bedtime.  5. Nutrition:  Continue Jevity 1.2 boluses 250 mL q.i.d., PEG tube      flushes will coincide q.i.d. at 100 mL.  Check BMET in the morning.      We will begin trials of pureed diet with speech therapy only.  May      need follow up      swallow.  The patient may have honey-thick liquids by teaspoon.  6. Bladder:  Check PVRs and intermittent catheterizations for no voids      within 8 hours or for volumes greater than 350 mL.      Meredith Staggers, M.D.  Electronically Signed     ZTS/MEDQ  D:  11/23/2008  T:  11/24/2008  Job:  [id number]

## 2010-10-18 NOTE — Discharge Summary (Signed)
NAMEERA, SMALDONE NO.:  [id number]   MEDICAL RECORD NO.:  [id number]          PATIENT TYPE:  IPS   LOCATION:  4036                         FACILITY:  [facility]   PHYSICIAN:  [name], M.D.DATE OF BIRTH:  1934-09-16   DATE OF ADMISSION:  11/23/2008  DATE OF DISCHARGE:  11/27/2008                               DISCHARGE SUMMARY   DISCHARGE DIAGNOSES:  1. Question gastrointestinal bleed with coffee-ground flushes from      percutaneous endoscopic gastrostomy as well as bleeding around      percutaneous endoscopic gastrostomy site.  2. 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  3. Left internal carotid artery stenosis at 99%.  4. Hypertension.  5. Dyslipidemia.  6. Tube feed-induced hypoglycemia.   HISTORY OF PRESENT ILLNESS:  Ms. Perezlopez is a 76 year old female with  history of hypertension and dyslipidemia, admitted to outside hospital  on June 14, with right-sided weakness and mutism.  CT of head done  showed hyperdense sign left MCA, compatible with acute thrombus, and old  left caudate infarct.  MRI of brain showed extensive infarct, left basal  ganglia, and two-thirds of temporal lobe at circle of Willis with flow  void and no flow in left ICA, left MCA at M1 and M2 segment.  Carotid  Doppler showed 79% right ICA stenosis and 80% left ICA stenosis.  The  patient globally aphasic with dysphasia and was started on pureed diet  with honey liquids, poor p.o. intake, PEG was placed for nutritional  support on June 28 per reports.  Therapies were initiated, and the  patient was noted to have significant deficit, mobility, and self-care,  and it was felt that she would benefit from inpatient rehab program.  The patient is admitted to [facility] on June 21 for progressive therapies.   PAST MEDICAL HISTORY:  Notable for;  1. Long-term gradual hearing loss.  2. Renal calculi.  3. Osteoarthritis, right knee.  4. Hypertension.  5. Dyslipidemia.   ALLERGIES:  No known drug allergies.   FAMILY HISTORY:  Positive for CVA.   SOCIAL HISTORY:  The patient is a retired Marine scientist.  Lives with husband in  2-level home with 6 steps at entry.  Quit tobacco x26 years, uses  alcohol rarely.   FUNCTIONAL HISTORY:  The patient was independent driving prior to  admission.   FUNCTIONAL STATUS:  The patient is max assist for bed mobility, max  assist for transfers, able to sit edge of bed unsupported, but unable to  weightbear to the right lower extremity.  Remains aphasic and aphonic.   PHYSICAL EXAMINATION:  GENERAL:  The patient is pleasant female alert  and in no acute distress.  HEENT:  Pupils equal, round, and reactive to light.  Ears, nose, and  throat exam notable for intact dentition with moist oral mucosa.  NECK:  Supple without JVD or lymphadenopathy.  CHEST:  Clear to auscultation bilaterally without wheezes, rales, or  rhonchi.  HEART:  Regular rate rhythm without murmurs, gallops, or rubs.  EXTREMITIES:  No evidence of edema  or cyanosis.  Some erythema noted on  right heel.  ABDOMEN:  Soft and nontender with positive bowel sounds.  PEG tube site  is intact with some irritation on skin due to tight-fitting bumper.  NEUROLOGIC:  Notable for central VII and tongue deviation, poor oral  motor control, overall.  Reflexes 1+ on right and left.  Sensation is  still present to pain.  Sensation right arm and right leg.  Unable to  assess judgment, orientation, memory, and mood.  She is a bit flat,  however, she was globally aphasic with expressive moderate receptive.  Right hemiparesis is noted, upper extremity greater than lower  extremity.  Moves left upper extremity and left lower extremity without  difficulty.   HOSPITAL COURSE:  Ms. Emory Prashad was admitted to rehab on November 23, 2008, for inpatient therapies to consist of PT, OT, and speech therapy  at least 3 hours 5 days a week.  Past  admission physiatrist, rehab RN,  and therapy team have worked together to provide customized  collaborative interdisciplinary care.  Rehab RN has been working with  the patient on skin care monitoring with pressure relief measures.  They  have also been assisting as bowel and bladder training.  The patient was  noted to be incontinent at admission.  PVRs were checked with  BladderScan, and the patient was noted to have retention with volumes at  500-750 mL.  Labs were done past admission revealing hemoglobin 11.8,  hematocrit 34.6, white count 8.3, and platelets 307.  Check of lytes  revealed sodium 140, potassium 3.6, chloride 106, CO2 of 26, BUN 18,  creatinine 0.8, and glucose 100.  LFTs revealed AST 22, ALT 29, alkaline  phos 85, and albumin 2.7.  A UA/UC was sent off.  UA was noted to be  negative.  Urine culture showed no growth.  The patient was started on  low-dose Urecholine 10 mg q.i.d. to see if this could help improve her  voiding function.  Additionally, Plavix was added for CVA prophylaxis  past admission, and Neurology was consulted for full workup of the  patient's stroke.  Dr. Leonie Man evaluated the patient and recommended  cerebral angiogram to evaluate extra and intracranial stenosis to decide  about elective left ICA.  He also recommended strict control for  dyslipidemia and blood pressure.  Cerebral angio done on June 25  revealed 99% stenosis of left ICA proximally with swelling deficits  probably representing clot significant disease, mid cervical left ICA,  40% stenosis of right ICA proximally, excellent flow to left cerebral  hemisphere via ACOM and left PCOM.  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  Dr. Leonie Man felt that the patient likely to occlude left  ICA over next few weeks, but emergent left CEA would increase  hemorrhage, risk given large MCA infarct with severe deficits.  He  recommended aspirin and Plavix for now with CT angio in 4 weeks to  see  for any measurable improvement and may consider left CEA in the future.  The patient has had issues with hypoglycemia secondary to tube feeds.  CBGs have been checked on a.c. and h.s. base with sliding scale insulin  for blood sugar control.  Hemoglobin A1c was noted to be normal at 5.6.  The patient's stay has been eventful and that on June 25, the patient  was noted to have bleeding from around PEG site.  Dr. Benson Norway GI was  consulted for input.  He recommended  flushing PEG with 500-600 mL of  normal saline.  PEG was flushed and return of dark coffee-ground bloody  material of 600 mL was aspirated.  The patient did continue to have  issues with bleeding around PEG site.  She was started on IV fluids.  She was typed and crossed for 2 units of packed red blood cells.  Lovenox and Plavix were placed on hold.  General Surgery was consulted  for input, and recommended serial H and H q.6 h. as well as transferred  to Acute Services.  The patient's PEG site had 2.0 silk purse-string  suture placed by Dr. Dalbert Batman.  Additionally, external pressure was placed  by sandbag.  Dr. Broadus John with [facility] was consulted for  transfer of the patient to Acute Services for closer monitoring.  The  patient was transferred off to Acute Services on November 27, 2008.   During the patient's stay in rehab, therapy evals were done and therapy  was ongoing.  At the time of admission, the patient was mod to max  assist for sit to stand transfers and static standing balance.  She was  smiling appropriately.  ADL retraining focused on motor planning to  follow commands and to contact.  The patient required max verbal cues  for lower body dressing.  Max cuing was needed for hemi-dressing  technique.  PT eval revealed the patient had max assist to stand in  parallel bars.  She is able to take 3 steps with max assist in parallel  bars, decreased initiation and activation of right lower extremity and  decreased  attention to right side was noted.  At the time of transfer to  Acute Services, the patient was showing increase in initiation of right  side with assist of right knee and hip to increase extension on right  and weight shift.  She was showing good initiation of standing as well  as lateral weight shifting.  The patient was able to get to sit to stand  in parallel bars with min assist.  She was mod assist to take 2 steps in  parallel bars.  Speech therapy evaluation revealed the patient with  moderate-to-severe receptive aphasia with inconsistent yes no responses  inconsistent to feel object discrimination.  She was able to follow  limited visual demonstration of 1-step command, was able to attend for  60-minute session with periodic cues to redirect.  No verbal output, but  automatic voicing with cough and yawn was noted.  The patient's deficit  severely impacted ability to comprehend and express need as well as  consume p.o.'s.  MBS was done on June 24, and the patient was started on  D3 diet thin liquids.  The patient was able to complete tracing of name  with setup and hand guiding at time of discharge.  She did require cues  to selectively attend to task in quiet environment.  She was able to  complete oral care with setup and initiation cues for appropriate  placement of brush.  Due to change in her medical status, the patient  was transferred to Acute Services on November 27, 2008.   DISCHARGE MEDICATIONS AT THE TIME OF TRANSFER:  1. Crestor 20 mg a day.  2. Zetia 10 mg a day.  3. Prozac 5 mg a day.  4. Urecholine 10 mg q.i.d.  5. Protonix 40 mg per day.  6. Plavix 75 mg a day.  7. Aspirin 81 mg a day.  8. Lovenox 40 mg placed  on hold.   Diet, activity level, and further medication changes to be decided by  [facility].      Thornton Dales, P.A.      [name], M.D.  Electronically Signed    PP/MEDQ  D:  11/30/2008  T:  12/01/2008  Job:  [id number]    cc:   Tory Emerald. Benson Norway, MD  Pramod P. Leonie Man, MD

## 2010-10-18 NOTE — Consult Note (Signed)
[name], [name]             ACCOUNT NO.:  [id number]   MEDICAL RECORD NO.:  [id number]          PATIENT TYPE:  IPS   LOCATION:  4036                         FACILITY:  [facility]   PHYSICIAN:  Tory Emerald. Benson Norway, MD    DATE OF BIRTH:  01-06-1935   DATE OF CONSULTATION:  DATE OF DISCHARGE:  11/27/2008                                 CONSULTATION   REASON FOR CONSULTATION:  Bleeding from PEG site.   HISTORY OF PRESENT ILLNESS:  This is a 76 year old female with past  medical history of hypertension, hyperlipidemia, and tobacco abuse who  recently suffered a left middle cerebral artery stroke while she was  visiting [place].  At that time, she had a sudden onset of right-  sided weakness and aphasia, and subsequently she sought further  treatment there when she was visiting.  Unfortunately at that time, the  stroke was quite extensive and she has been transferred back to  [facility] for further longterm care.  The patient did have a PEG tube  placement approximately 1 week ago and she has been on aspirin, however,  she was started on Plavix on this past Monday and subsequently also  Lovenox.  She was doing well until today where she started having  significant oozing from the PEG site.  GI consult was requested for  further evaluation and treatment.   PAST MEDICAL HISTORY AND PAST SURGICAL HISTORY:  As stated above with  the addition of osteoarthritis of the right knee and renal stones.   ALLERGIES:  No known drug allergies.   REVIEW OF SYSTEMS:  Unable to obtain.   FAMILY HISTORY:  Significant for CVA.   SOCIAL HISTORY:  The patient is a retired Marine scientist.  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  History of tobacco abuse, but she quit 26 years ago.  No  alcohol or illicit drug use.   MEDICATIONS:  1. Aspirin 81 mg p.o. daily.  2. Bethanechol 10 mg p.o. q.i.d.  3. Zetia 10 mg p.o. daily.  4. Prozac 5 mg p.o. daily.  5. Sliding scale insulin.  6. Jevity tube feedings.  7.  Lisinopril 10 mg p.o. daily.  8. Metoprolol 25 mg p.o. b.i.d.  9. Crestor 20 mg p.o. at bedtime.  10.Bisacodyl 10 mg p.o. at bedtime.  11.Phenergan 12.5 mg p.o. q.6 h. p.r.n.  12.Diphenhydramine 25 mg p.o. q.6 h. p.r.n.   PHYSICAL EXAMINATION:  VITAL SIGNS:  Blood pressure is 130/81, heart  rate is 85, respirations 22, and temperature is 98.3.  GENERAL:  The patient is in no acute distress, appear to be alert.  HEENT:  Normocephalic, atraumatic.  NECK:  Supple.  No lymphadenopathy.  LUNGS:  Clear to auscultation bilaterally.  CARDIOVASCULAR:  Regular rate and rhythm.  ABDOMEN:  Obese, soft.  There is a PEG tube just on the left side  lateral to the midline with active bleeding from the site.  Abdomen is  nontender, nondistended.  EXTREMITIES:  No clubbing, cyanosis, or edema.   LABORATORY VALUES:  White blood cell count is 13.9, hemoglobin 12.1,  platelets at 394.   IMPRESSION:  1. Bleeding from the percutaneous endoscopic gastrostomy site      secondary to the anticoagulation.  2. Left middle cerebral artery stroke.  3. Severe internal carotid artery disease.   At this time, I attempted slow arrest of bleeding by increasing the  tension on the PEG bumper as well as placing the pressure bandage.  Unfortunately, this has not significantly decreased the bleeding rate.   PLAN:  1. At this time, she is to transfer to an acute floor.  2. To hold the Lovenox and the Plavix.  I believe she should stay on      aspirin in regards to the severity of her vascular disease and in      hopes of prevention of any further extension of the thrombosis.      Also, I will request a surgical consult to see if they can provide      any further therapeutic measures to help decrease the rate of      bleeding.  3. Type and crossmatch, and transfuse as needed.      Tory Emerald Benson Norway, MD  Electronically Signed     PDH/MEDQ  D:  11/27/2008  T:  11/28/2008  Job:  [id number]

## 2010-10-18 NOTE — Assessment & Plan Note (Signed)
[name] is a 76 year old female, who had a large left MCA  distribution CVA, who is in the basal ganglia on imaging study, but  clinically fact that a wider distribution including clinical signs of  aphasia, right hemiplegia, and severe dysphasia.  She had a PEG  placement prior to coming to rehab on November 30, 2008.  While at rehab,  she had a GI bleed.  General surgery placed a pursestring suture on the  PEG, this decreased the bleeding.  CT of the abdomen and pelvis showed  no evidence of retroperitoneal bleed.  She had 2 units of packed red  blood cells.  She is transferred back to rehab and completed her rehab  program as an inpatient on December 24, 2008.  She has received outpatient  therapy initially through home health, but now with outpatient at [facility].  She has had no additional medical complications since discharge.  She has made good functional improvement.  She started walking with the  quad cane upgrading from a walker.  However, has developed some  increased knee pain, underwent orthopedic evaluation by Dr. Aline Brochure.  He had some fluid taken off and then had injection of corticosteroid  with improvements in right knee pain.  She still requires assistance  with dressing, bathing, toileting.  She is starting to play piano with  her left hand only.  She has had a consultation with Dr. Estanislado Pandy.  Cerebral angiogram was performed November 25, 2008 showing 99% stenosis of  left ICA probable clot, 40% right ICA was not felt to require any type  of standing or other interventional procedures.  The right ICA artery  stenosis needs to be followed over time with repeat studies.   Her pain level is about 1/10 in the knee.   She is walking up steps independently, but down steps with assistance.  Bathing and toileting still require her husband's assistance.   PAST MEDICAL HISTORY:  Hyperlipidemia, hypertension in addition to  above.   SOCIAL HISTORY:  Married and lives with her  husband.   FAMILY HISTORY:  Stroke.   Other review of systems, constipation has improved actually having some  loose stools.   Blood pressure 164/77, pulse 66, respirations 18, O2 sat 95% on room  air.  General, in no acute distress.  Mood and affect appropriate.  Orientation x3.  Her speech is very limited.  She can answer single-word  questions.  She does not do well with open ended questions.  Her speech  is dysarthric and hypophonic.  She has good comprehension, although she  still has some apraxia noted from a motor standpoint.  She has 5/5  strength in the left upper and left lower extremity.  In the right lower  extremity, she has 3- in the hip flexion, knee extension, and  dorsiflexion.  In the left upper extremity, 2- in the finger flexors,  arm flexors, arm abductors.  She has pain in the left elbow with  external rotation.  She has no tenderness over the medial epicondyle or  along the medial collateral ligament.  She has pain with passive  extension of the wrist and she motions that it is her forearm on the  right side when she does this.   IMPRESSION:  1. Right spastic hemiplegia due to cerebrovascular accident, really      does not have any significant tone, problems requiring any further      intervention.  She has some clonus at the right ankle,  but      otherwise no excessive tone.  2. The patient has right knee pain likely osteoarthritis.  Seen Dr.      Aline Brochure for this.   I will see her back in about 6 weeks.  She will continue her outpatient  PT, OT, and speech.  I would expect by the next visit, she should be  ambulating with a cane.      [name], M.D.  Electronically Signed     AEK/MedQ  D:  01/26/2009 11:16:06  T:  01/27/2009 04:14:25  Job #:  [id number]   cc:   Percell Miller L. Luan Pulling, M.D.  Fax: [phone]   Carole Civil, M.D.  Fax: [phone]

## 2010-10-18 NOTE — Consult Note (Signed)
[name], [name]             ACCOUNT NO.:  [id number]   MEDICAL RECORD NO.:  [id number]          PATIENT TYPE:  OUT   LOCATION:  XRAY                         FACILITY:  [facility]   PHYSICIAN:  Sanjeev K. Deveshwar, M.D.DATE OF BIRTH:  Sep 04, 1934   DATE OF CONSULTATION:  01/11/2009  DATE OF DISCHARGE:  01/11/2009                                 CONSULTATION   CHIEF COMPLAINT:  CVA.   HISTORY OF PRESENT ILLNESS:  This is a very pleasant 76 year old female  who was admitted to [facility] from November 30, 2008 to December 24, 2008, after she suffered a large left CVA on November 16, 2008, while visiting family in [place].  The patient had a  cerebral angiogram performed on November 25, 2008, that revealed a 99%  stenosis of the left internal carotid artery with a probable clot.  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  The patient presents today accompanied by her husband  for evaluation by Dr. Estanislado Pandy for her cerebrovascular disease.  She is  due to see Dr. Letta Pate on the 24th of this month for further followup  in regards to her rehabilitation.   PAST MEDICAL HISTORY:  Significant for a large left CVA in November 16, 2008, that has left the patient with significant right hemiplegia and  aphasia.  She has a history of hyperlipidemia and hypertension.  She has  been hard of hearing.  She has osteoarthritis, history of renal calculi,  and remote history of tobacco use.  She has had some problems with her  right knee.  She had a percutaneous gastrostomy feeding tube placed in  [place] after her stroke.  Apparently, she had some issues with  bleeding from this site while she was on the Rehab Unit, the feeding  tube has since been removed.   SURGICAL HISTORY:  The patient has had renal calculi.  She has had an  appendectomy.   ALLERGIES:  No known drug allergies.   CURRENT MEDICATIONS:  Lopressor, Zetia, Plavix, Crestor, Protonix,  multivitamin, and Senokot.  She is not on aspirin.   SOCIAL HISTORY:  The patient is married.  She and her husband have no  children.  They live in [place].  The patient quit smoking 26 years  ago.  She uses alcohol rarely.  She is a retired Marine scientist.  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   FAMILY HISTORY:  Her mother died at age 33 from natural causes.  Her  father died at age 78 from the CVA.   IMPRESSION AND PLAN:  The patient presents today accompanied by her  husband for further evaluation of cerebrovascular disease.  Dr.  Estanislado Pandy reviewed the images from the angiogram with the patient and  her husband.  He pointed out the severe stenosis in the left internal  carotid artery.  Dr. Estanislado Pandy did not feel that treatment of this  stenosis would result in any improvement  in the patient's status.  He  was concerned about the mild stenosis of the right internal carotid  artery and felt that this would need to be followed closely.  Recommended an ultrasound to be performed in late October with  subsequent ultrasounds for close followup.  The patient's husband asked  about the cerebral aneurysm noted at the time of the angiogram.  Dr.  Estanislado Pandy felt that this was not a high priority at this time.   Apparently, the patient is participating in outpatient physical,  occupational, and speech therapies.  She at times has some spontaneous  speech, but for the most part is aphasic.  She is alert.  She follows  some commands.  The patient's husband reports that she has made  significant progress in therapy.  She is now able to walk with a walker  and at times with a cane.  Occasionally, she has some spontaneous speech  which is appropriate.   All of the patient's husband's questions were answered.  Greater than 45  minutes was spent on this evaluation.  As noted, the patient  will have a  bilateral carotid ultrasound in late October for further followup.      Mikey Bussing, P.A.    ______________________________  Fritz Pickerel. Estanislado Pandy, M.D.    DR/MEDQ  D:  01/11/2009  T:  01/12/2009  Job:  [id number]   cc:   Percell Miller L. Luan Pulling, M.D.  Pramod P. Leonie Man, MD  [name], M.D.

## 2010-10-18 NOTE — Consult Note (Signed)
[name], [name] NO.:  [id number]   MEDICAL RECORD NO.:  [id number]          PATIENT TYPE:  IPS   LOCATION:  4036                         FACILITY:  [facility]   PHYSICIAN:  Edsel Petrin. Dalbert Batman, M.D.DATE OF BIRTH:  05-Mar-1935   DATE OF CONSULTATION:  11/27/2008  DATE OF DISCHARGE:  11/27/2008                                 CONSULTATION   CONSULTING SURGEON:  Edsel Petrin. Dalbert Batman, MD   REQUESTING PHYSICIAN:  Dr. Patrice Paradise   REASON FOR CONSULTATION:  Uncontrolled bleeding around PEG site.   HISTORY OF PRESENT ILLNESS:  [name] is a 76 year old female patient  who was admitted to the rehab unit on November 23, 2008, for rehab after  suffering a massive thrombotic CVA while in [place] on November 16, 2008.  While the patient was in [place], her PEG tube was placed.  She was also started on full dose aspirin.  After arriving here, she was  reevaluated by both the rehab physician and Neurology and was felt that  she needed to be on baby aspirin as well as Plavix due to her from  thromboembolic stroke.  In addition, she was started on Lovenox  prophylaxis.  Beginning June 25 in the morning, a slight bloody ooze was  noted around the PEG site and over the period of the day, this gradually  increased to a more profuse drainage of blood as well as blood was  aspirated from the PEG tube with attempts to administer medications.  Gastroenterology was called.  Dr. Benson Wheeler evaluated the patient.  With  direct pressure and other methods, he was unable to stop bleeding.  Therefore, surgical consultation has been obtained.   REVIEW OF SYSTEMS:  Unobtainable from the patient, as per the history  present illness.   PAST MEDICAL HISTORY:  1. Hypertension.  2. Dyslipidemia.  3. Hearing loss.  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   PAST SURGICAL HISTORY:  PEG in mid June in [place].   SOCIAL HISTORY:  She is married.   ALLERGIES:  NKDA.   CURRENT  MEDICATIONS:  Crestor; Prinivil; Zetia; Lovenox, now on hold;  Plavix, now on hold; sliding scale insulin; Prozac; metoprolol;  Urecholine; aspirin; Jevity tube feedings, on hold until bleeding is  definitely stopped; and various p.r.n. medications for symptom  management.   PHYSICAL EXAMINATION:  GENERAL:  A mildly obtunded female who does  respond to pain who appears pale.  VITAL SIGNS:  Temperature 98.3, BP 133/81, pulse 85 and regular, and  respirations 22.  LUNGS:  Clear to auscultation anteriorly.  The patient is on nasal  cannula O2, nontachypneic.  CARDIOVASCULAR:  Heart sounds S1 and S2.  No rubs, murmurs, or gallops.  Nontachycardic.  EXTREMITIES:  Warm.  Symmetrical in appearance without cyanosis or  clubbing.  ABDOMEN:  Obese and soft.  Bowel sounds are present.  She has profuse  bright red blood oozing around the PEG tube insertion site.   LABORATORY DATA:  From 1:00 p.m. today, PTT 25, PT 13, and INR 1.0.  Hemoglobin 12.1, baseline hemoglobin before this 11.3.  IMPRESSION:  Uncontrollable bleeding around percutaneous endoscopic  gastrostomy tube.   PLAN:  1. Hold anticoagulation except aspirin now.  Plan is to suture the      site and observe.  2. Serial H and H q.6 h for 24 hours.  3. Transfer to Acute Care for observation.   PROCEDURE NOTE:  Due to excessive bleeding, no skin prep was utilized.  Next, using a 2-0 silk suture, Dr. Dalbert Batman applied a pursestring suture  into the skin around the PEG site and external hemostasis was achieved.  Pressure dressing with manual pressure was applied with Elastoplast,  sandbag for at least 24 hours and follow H and H as described.  PEG tube  was flushed of any remaining blood.      [name], N.P.      Edsel Petrin. Dalbert Batman, M.D.  Electronically Signed    ALE/MEDQ  D:  11/27/2008  T:  11/28/2008  Job:  [id number]   cc:   Ruth Emerald. Benson Norway, MD

## 2011-05-13 ENCOUNTER — Other Ambulatory Visit: Payer: Self-pay

## 2011-05-13 ENCOUNTER — Inpatient Hospital Stay (HOSPITAL_COMMUNITY)
Admission: EM | Admit: 2011-05-13 | Discharge: 2011-05-15 | DRG: 690 | Disposition: A | Discharge status: Home / Self Care | Payer: Medicare Other | Attending: Pulmonary Disease | Admitting: Pulmonary Disease

## 2011-05-13 ENCOUNTER — Encounter: Payer: Self-pay | Admitting: *Deleted

## 2011-05-13 ENCOUNTER — Emergency Department (HOSPITAL_COMMUNITY): Payer: Medicare Other

## 2011-05-13 DIAGNOSIS — I6529 Occlusion and stenosis of unspecified carotid artery: Secondary | ICD-10-CM | POA: Diagnosis present

## 2011-05-13 DIAGNOSIS — R55 Syncope and collapse: Secondary | ICD-10-CM | POA: Diagnosis present

## 2011-05-13 DIAGNOSIS — I951 Orthostatic hypotension: Secondary | ICD-10-CM | POA: Diagnosis present

## 2011-05-13 DIAGNOSIS — N390 Urinary tract infection, site not specified: Principal | ICD-10-CM | POA: Diagnosis present

## 2011-05-13 DIAGNOSIS — I69928 Other speech and language deficits following unspecified cerebrovascular disease: Secondary | ICD-10-CM

## 2011-05-13 DIAGNOSIS — Z8673 Personal history of transient ischemic attack (TIA), and cerebral infarction without residual deficits: Secondary | ICD-10-CM

## 2011-05-13 DIAGNOSIS — E860 Dehydration: Secondary | ICD-10-CM | POA: Diagnosis present

## 2011-05-13 DIAGNOSIS — I658 Occlusion and stenosis of other precerebral arteries: Secondary | ICD-10-CM | POA: Diagnosis present

## 2011-05-13 DIAGNOSIS — R112 Nausea with vomiting, unspecified: Secondary | ICD-10-CM

## 2011-05-13 DIAGNOSIS — I10 Essential (primary) hypertension: Secondary | ICD-10-CM | POA: Diagnosis present

## 2011-05-13 HISTORY — DX: Unspecified hearing loss, unspecified ear: H91.90

## 2011-05-13 HISTORY — DX: Pure hypercholesterolemia, unspecified: E78.00

## 2011-05-13 HISTORY — DX: Cerebral infarction, unspecified: I63.9

## 2011-05-13 HISTORY — DX: Reserved for inherently not codable concepts without codable children: IMO0001

## 2011-05-13 HISTORY — DX: Unilateral primary osteoarthritis, right knee: M17.11

## 2011-05-13 LAB — URINALYSIS, ROUTINE W REFLEX MICROSCOPIC
Bilirubin Urine: NEGATIVE
Glucose, UA: NEGATIVE mg/dL
Nitrite: NEGATIVE
Specific Gravity, Urine: 1.02 (ref 1.005–1.030)
pH: 6.5 (ref 5.0–8.0)

## 2011-05-13 LAB — CBC
HCT: 37.9 % (ref 36.0–46.0)
Hemoglobin: 12.4 g/dL (ref 12.0–15.0)
RBC: 4.37 MIL/uL (ref 3.87–5.11)

## 2011-05-13 LAB — URINE MICROSCOPIC-ADD ON

## 2011-05-13 LAB — COMPREHENSIVE METABOLIC PANEL
Alkaline Phosphatase: 76 U/L (ref 39–117)
BUN: 24 mg/dL — ABNORMAL HIGH (ref 6–23)
CO2: 30 mEq/L (ref 19–32)
Chloride: 103 mEq/L (ref 96–112)
GFR calc Af Amer: 53 mL/min — ABNORMAL LOW (ref >90)
GFR calc non Af Amer: 46 mL/min — ABNORMAL LOW (ref >90)
Glucose, Bld: 118 mg/dL — ABNORMAL HIGH (ref 70–99)
Potassium: 3.7 mEq/L (ref 3.5–5.1)
Total Bilirubin: 0.4 mg/dL (ref 0.3–1.2)

## 2011-05-13 LAB — DIFFERENTIAL
Lymphs Abs: 1.1 10*3/uL (ref 0.7–4.0)
Monocytes Relative: 10 % (ref 3–12)
Neutro Abs: 13.7 10*3/uL — ABNORMAL HIGH (ref 1.7–7.7)
Neutrophils Relative %: 82 % — ABNORMAL HIGH (ref 43–77)

## 2011-05-13 LAB — POCT I-STAT TROPONIN I: Troponin i, poc: 0 ng/mL (ref 0.00–0.08)

## 2011-05-13 MED ORDER — SODIUM CHLORIDE 0.9 % IV BOLUS (SEPSIS)
500.0000 mL | Freq: Once | INTRAVENOUS | Status: AC
  Administered 2011-05-13: 500 mL via INTRAVENOUS

## 2011-05-13 MED ORDER — SODIUM CHLORIDE 0.9 % IV BOLUS (SEPSIS)
1000.0000 mL | Freq: Once | INTRAVENOUS | Status: AC
  Administered 2011-05-14: 1000 mL via INTRAVENOUS

## 2011-05-13 MED ORDER — DEXTROSE 5 % IV SOLN
1.0000 g | Freq: Once | INTRAVENOUS | Status: AC
  Administered 2011-05-14: 1 g via INTRAVENOUS
  Filled 2011-05-13: qty 10

## 2011-05-13 NOTE — ED Provider Notes (Signed)
History     CSN: [id number] Arrival date & time: 05/13/2011 10:09 PM   First MD Initiated Contact with Patient 05/13/11 2302      Chief Complaint  Patient presents with  . Loss of Consciousness  . Hypotension    (Consider location/radiation/quality/duration/timing/severity/associated sxs/prior treatment) Patient is a 76 y.o. female presenting with syncope. The history is provided by the spouse. The history is limited by the condition of the patient. No language interpreter was used.  Loss of Consciousness This is a new problem. The current episode started 1 to 2 hours ago. The problem occurs constantly. The problem has been resolved. Pertinent negatives include no chest pain, no abdominal pain, no headaches and no shortness of breath. The symptoms are aggravated by nothing. The symptoms are relieved by nothing. She has tried nothing for the symptoms. The treatment provided significant relief.  Patient was in her usual state of health until this evening she ate dinner and was in another room and her husband heard a noise and went in and found her slumped in a chair and then transferred her to the bed and she became unresponsive.  She vomited and then lost control of her bowel and bladder function.  EMS arrived and BP was low and she was given a fluid bolus with improvement in pressure.  No antecedent illness per husband.  Past Medical History  Diagnosis Date  . Stroke   . High cholesterol   . Hearing impaired person   . Degenerative joint disease of knee, right     Past Surgical History  Procedure Date  . Kidney stones   . Lithotripsy     History reviewed. No pertinent family history.  History  Substance Use Topics  . Smoking status: Former Research scientist (life sciences)  . Smokeless tobacco: Not on file  . Alcohol Use: No    OB History    Grav Para Term Preterm Abortions TAB SAB Ect Mult Living                  Review of Systems  Unable to perform ROS Respiratory: Negative for shortness of  breath.   Cardiovascular: Positive for syncope. Negative for chest pain.  Gastrointestinal: Negative for abdominal pain.  Neurological: Negative for headaches.    Allergies  Review of patient's allergies indicates no known allergies.  Home Medications   Current Outpatient Rx  Name Route Sig Dispense Refill  . CLOPIDOGREL BISULFATE 75 MG PO TABS Oral Take 75 mg by mouth daily.      Marland Kitchen LISINOPRIL-HYDROCHLOROTHIAZIDE 20-12.5 MG PO TABS Oral Take 1 tablet by mouth daily.      . CRESTOR PO Oral Take 20 mg by mouth.     . SENNA CO Combination by Combination route.        BP 94/71  Pulse 79  Resp 16  SpO2 100%  Physical Exam  Constitutional: She appears well-developed and well-nourished.  HENT:  Head: Normocephalic and atraumatic.  Right Ear: External ear normal.  Left Ear: External ear normal.  Mouth/Throat: Oropharynx is clear and moist. No oropharyngeal exudate.  Eyes: Conjunctivae and EOM are normal. Pupils are equal, round, and reactive to light.  Neck: Normal range of motion. Neck supple. Tracheal deviation present.  Cardiovascular: Normal rate and regular rhythm.   Pulmonary/Chest: Effort normal and breath sounds normal. She has no wheezes. She has no rales.  Abdominal: Soft. Bowel sounds are normal. There is no tenderness. There is no rebound and no guarding.  Musculoskeletal: Normal range of  motion. She exhibits no edema.  Neurological: She is alert. She has normal reflexes.       Expressive aphasia  Skin: Skin is warm and dry. She is not diaphoretic.    ED Course  Procedures (including critical care time)   Labs Reviewed  URINALYSIS, ROUTINE W REFLEX MICROSCOPIC  CBC  DIFFERENTIAL  COMPREHENSIVE METABOLIC PANEL  I-STAT TROPONIN I  URINE CULTURE   No results found.   No diagnosis found.    MDM   Date: 05/14/2011  Rate:79  Rhythm: normal sinus rhythm  QRS Axis: normal  Intervals: PR prolonged  ST/T Wave abnormalities: normal  Conduction  Disutrbances:first-degree A-V block   Narrative Interpretation:   Old EKG Reviewed: none available     CRITICAL CARE Performed by: Carlisle Beers   Total critical care time: 30 minutes  Critical care time was exclusive of separately billable procedures and treating other patients.  Critical care was necessary to treat or prevent imminent or life-threatening deterioration.  Critical care was time spent personally by me on the following activities: development of treatment plan with patient and/or surrogate as well as nursing, discussions with consultants, evaluation of patient's response to treatment, examination of patient, obtaining history from patient or surrogate, ordering and performing treatments and interventions, ordering and review of laboratory studies, ordering and review of radiographic studies, pulse oximetry and re-evaluation of patient's condition.  MDM Reviewed: nursing note and vitals Interpretation: ECG, labs and x-ray Total time providing critical care: 30-74 minutes. This excludes time spent performing separately reportable procedures and services. Consults: admitting MD   The patient appears reasonably stabilized for admission considering the current resources, flow, and capabilities available in the ED at this time, and I doubt any other [facility] requiring further screening and/or treatment in the ED prior to admission.  Carlisle Beers, MD 05/14/11 [phone]

## 2011-05-13 NOTE — ED Notes (Signed)
Patient with LOC per report, found to be hypotensive, CBG 134 per [facility], hx of CVA

## 2011-05-14 ENCOUNTER — Inpatient Hospital Stay (HOSPITAL_COMMUNITY): Payer: Medicare Other

## 2011-05-14 LAB — CARDIAC PANEL(CRET KIN+CKTOT+MB+TROPI)
CK, MB: 2.2 ng/mL (ref 0.3–4.0)
Relative Index: INVALID (ref 0.0–2.5)
Total CK: 67 U/L (ref 7–177)
Troponin I: <0.3 ng/mL (ref <0.30)

## 2011-05-14 LAB — BASIC METABOLIC PANEL
BUN: 20 mg/dL (ref 6–23)
CO2: 25 mEq/L (ref 19–32)
Calcium: 7.9 mg/dL — ABNORMAL LOW (ref 8.4–10.5)
Creatinine, Ser: 0.9 mg/dL (ref 0.50–1.10)
Glucose, Bld: 102 mg/dL — ABNORMAL HIGH (ref 70–99)

## 2011-05-14 LAB — CBC
MCH: 28.4 pg (ref 26.0–34.0)
MCHC: 32.8 g/dL (ref 30.0–36.0)
MCV: 86.6 fL (ref 78.0–100.0)
Platelets: 272 10*3/uL (ref 150–400)
RDW: 13.1 % (ref 11.5–15.5)
WBC: 9.6 10*3/uL (ref 4.0–10.5)

## 2011-05-14 MED ORDER — ONDANSETRON HCL 4 MG/2ML IJ SOLN: 4.0000 mg | Freq: Four times a day (QID) | INTRAMUSCULAR | Status: DC | PRN

## 2011-05-14 MED ORDER — ROSUVASTATIN CALCIUM 20 MG PO TABS
20.0000 mg | ORAL_TABLET | Freq: Every day | ORAL | Status: DC
  Administered 2011-05-14: 20 mg via ORAL
  Filled 2011-05-14: qty 1

## 2011-05-14 MED ORDER — SODIUM CHLORIDE 0.9 % IV SOLN
Freq: Once | INTRAVENOUS | Status: AC
  Administered 2011-05-14: 02:00:00 via INTRAVENOUS

## 2011-05-14 MED ORDER — DEXTROSE 5 % IV SOLN
1.0000 g | INTRAVENOUS | Status: DC
  Administered 2011-05-14: 1 g via INTRAVENOUS
  Filled 2011-05-14: qty 10

## 2011-05-14 MED ORDER — CLOPIDOGREL BISULFATE 75 MG PO TABS: 75.0000 mg | ORAL_TABLET | Freq: Every day | ORAL | Status: DC

## 2011-05-14 MED ORDER — ONDANSETRON HCL 4 MG PO TABS: 4.0000 mg | ORAL_TABLET | Freq: Four times a day (QID) | ORAL | Status: DC | PRN

## 2011-05-14 MED ORDER — POTASSIUM CHLORIDE IN NACL 40-0.9 MEQ/L-% IV SOLN
INTRAVENOUS | Status: DC
  Administered 2011-05-14: 05:00:00 via INTRAVENOUS
  Filled 2011-05-14 (×2): qty 1000

## 2011-05-14 MED ORDER — CLOPIDOGREL BISULFATE 75 MG PO TABS
75.0000 mg | ORAL_TABLET | Freq: Every day | ORAL | Status: DC
  Administered 2011-05-14 – 2011-05-15 (×2): 75 mg via ORAL
  Filled 2011-05-14 (×2): qty 1

## 2011-05-14 MED ORDER — SODIUM CHLORIDE 0.9 % IV SOLN
INTRAVENOUS | Status: DC
  Administered 2011-05-14: 16:00:00 via INTRAVENOUS

## 2011-05-14 MED ORDER — POTASSIUM CHLORIDE IN NACL 40-0.9 MEQ/L-% IV SOLN
INTRAVENOUS | Status: AC
  Filled 2011-05-14: qty 1000

## 2011-05-14 MED ORDER — LISINOPRIL 10 MG PO TABS
20.0000 mg | ORAL_TABLET | Freq: Every day | ORAL | Status: DC
  Administered 2011-05-15: 20 mg via ORAL
  Filled 2011-05-14: qty 2

## 2011-05-14 NOTE — H&P (Signed)
Chief Complaint:  Found slumped over in chair  HPI: 76 year old female with a history of CVA in the past he was found by her husband earlier this evening slumped over in her chair she was responsive but wasn't quite clear whether or not she passed out EMS was called she was found to have systolics in the [id number] and was brought to the emergency department where systolics were in the 0000000. She's been given a total of 3 L of IV fluids in the ED and her blood pressure has responded nicely and she's been found to have a UTI. She does have a history of a previous CVA however she's currently not have any focal neurological deficits. She currently denies any pain nausea vomiting diarrhea. She says she has been having some dysuria. Her husband is not around any more for me to question the details of the events and most of the history is obtained from the ED P. However per patient she was in her normal state of health prior to this happening and currently is feeling tired. He denies any fevers at home.  Review of Systems:  Otherwise negative  Past Medical History: Past Medical History  Diagnosis Date  . Stroke   . High cholesterol   . Hearing impaired person   . Degenerative joint disease of knee, right    Past Surgical History  Procedure Date  . Kidney stones   . Lithotripsy     Medications: Prior to Admission medications   Medication Sig Start Date End Date Taking? Authorizing Provider  clopidogrel (PLAVIX) 75 MG tablet Take 75 mg by mouth daily.     Yes Historical Provider, MD  lisinopril-hydrochlorothiazide (PRINZIDE,ZESTORETIC) 20-12.5 MG per tablet Take 1 tablet by mouth daily.     Yes Historical Provider, MD  Rosuvastatin Calcium (CRESTOR PO) Take 20 mg by mouth.    Yes Historical Provider, MD  SENNA CO by Combination route.     Yes Historical Provider, MD    Allergies:  No Known Allergies  Social History:  reports that she has quit smoking. She does not have any smokeless tobacco  history on file. She reports that she does not drink alcohol or use illicit drugs.  Family History: History reviewed. No pertinent family history.  Physical Exam: Filed Vitals:   05/14/11 0150 05/14/11 0156 05/14/11 0219 05/14/11 0238  BP: 85/40 113/48  100/64  Pulse:  80  83  Temp:    98.3 F (36.8 C)  TempSrc:    Oral  Resp:    18  Height:    5\' 4"  (1.626 m)  Weight:    82.1 kg (181 lb)  SpO2:  98% 98% 99%   Resp: clear to auscultation bilaterally Cardio: regular rate and rhythm, S1, S2 normal, no murmur, click, rub or gallop GI: soft, non-tender; bowel sounds normal; no masses,  no organomegaly Extremities: extremities normal, atraumatic, no cyanosis or edema Pulses: 2+ and symmetric Skin: Skin color, texture, turgor normal. No rashes or lesions Neurologic: Grossly normal   Labs on Admission:   [facility] 05/13/11 2318  NA 141  K 3.7  CL 103  CO2 30  GLUCOSE 118*  BUN 24*  CREATININE 1.15*  CALCIUM 8.7  MG --  PHOS --    Basename 05/13/11 2318  AST 13  ALT 17  ALKPHOS 76  BILITOT 0.4  PROT 5.4*  ALBUMIN 3.2*   No results found for this basename: LIPASE:2,AMYLASE:2 in the last 72 hours  Basename 05/13/11 2318  WBC  16.8*  NEUTROABS 13.7*  HGB 12.4  HCT 37.9  MCV 86.7  PLT 283    Radiological Exams on Admission: Dg Chest 2 View  05/14/2011  *RADIOLOGY REPORT*  Clinical Data: Shortness of breath.  Altered mental status.  CHEST - 2 VIEW  Comparison: None.  Findings: Shallow inspiration.  Mild cardiac enlargement with normal pulmonary vascularity.  No focal airspace consolidation in the lungs.  No blunting of costophrenic angles.  No pneumothorax. Tortuous and calcified aorta.  Degenerative changes in the thoracic spine.  Patchy sclerosis in the proximal left humerus probably represents enchondroma or bone infarct.  No periosteal reaction or expansile changes.  IMPRESSION: Shallow inspiration.  No evidence of active pulmonary disease. 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  Original Report Authenticated By: Neale Burly, M.D.   Ct Head Wo Contrast  05/14/2011  *RADIOLOGY REPORT*  Clinical Data: Loss of consciousness.  Altered mental status. Hypotension.  CT HEAD WITHOUT CONTRAST  Technique:  Contiguous axial images were obtained from the base of the skull through the vertex without contrast.  Comparison: None.  Findings: Diffuse cerebral and cerebellar atrophy.  Cerebral volume loss and encephalomalacia in the left anterior temporal lobe with patchy low attenuation changes in the deep white matter on the left and in the left posterior parietal region consistent with old infarcts.  Patchy low attenuation changes bilaterally in the deep white matter consistent with chronic small vessel ischemia.  No acute sulci effacement.  No midline shift.  No abnormal extra-axial fluid collections.  Basal cisterns are not effaced.  No evidence of acute intracranial hemorrhage.  Vascular calcifications in the intracranial arteries.  No depressed skull fractures.  Hypoaeration and opacification of the mastoid air cells bilaterally.  Visualized paranasal sinuses are not opacified.  IMPRESSION: Chronic atrophy, small vessel ischemic change, and old left cerebral infarcts.  No evidence of acute hemorrhage, mass lesion, or acute infarct.  Opacification of the mastoid air cells.  Original Report Authenticated By: Neale Burly, M.D.    Assessment/Plan Present on Admission:   76 year old female with UTI and orthostatic hypotension with probable early sepsis 1 UTI with probable early sepsis urine cultures and blood cultures have been sent off lactic acid level is pending we'll also check a Po calcitonin level will place on Rocephin. #2 history of CVA she has no focal neurological deficits at this time CT of her head is negative for any acute issues #3 hypertension: Her blood pressure meds at this time Continue telemetry monitoring 12-lead EKG shows  first-degree AV block and also serial cardiac enzymes overnight. Aryelle Figg A 05/14/2011, 3:36 AM

## 2011-05-14 NOTE — ED Notes (Signed)
Spoke with Dr. [name] about pt's b/p, she advised to repeat reading and get reading on both arms

## 2011-05-15 DIAGNOSIS — R55 Syncope and collapse: Secondary | ICD-10-CM | POA: Diagnosis present

## 2011-05-15 DIAGNOSIS — Z8673 Personal history of transient ischemic attack (TIA), and cerebral infarction without residual deficits: Secondary | ICD-10-CM

## 2011-05-15 DIAGNOSIS — I10 Essential (primary) hypertension: Secondary | ICD-10-CM | POA: Diagnosis present

## 2011-05-15 LAB — CARDIAC PANEL(CRET KIN+CKTOT+MB+TROPI)
CK, MB: 2 ng/mL (ref 0.3–4.0)
Total CK: 54 U/L (ref 7–177)

## 2011-05-15 MED ORDER — CEFUROXIME AXETIL 250 MG PO TABS: 250.0000 mg | ORAL_TABLET | Freq: Two times a day (BID) | ORAL | Status: AC

## 2011-05-15 NOTE — Progress Notes (Signed)
NAMECLARIA, GIARRUSSO NO.:  [id number]  MEDICAL RECORD NO.:  [id number]  LOCATION:                                 FACILITY:  PHYSICIAN:  Paula Compton. Willey Blade, MD       DATE OF BIRTH:  May 13, 1935  DATE OF PROCEDURE:  05/14/2011 DATE OF DISCHARGE:                                PROGRESS NOTE   [name] was admitted last night by the hospitalist after experiencing a syncopal episode at home.  She had a stroke 2-1/2 years ago and has made a remarkable recovery.  She now has no neurological complaints and feels much better.  She had an episode of vomiting last night after the syncopal episode.  She was found to have an urinary tract infection and was treated with Rocephin.  She had a white count of 16.8 on admission which improved to 9.6 by this morning.  Her BUN and creatinine were mildly elevated at 24 and 1.15 and have improved to 20 and 0.90.  She has been treated with IV fluids.  She had systolic blood pressures in the 80s initially and responded well to IV fluids.  She has a history of hypertension treated with lisinopril HCT.  She is also on Plavix and Crestor.  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  She was also shown to have poor and retrograde flow in the right vertebral artery.  Her vital signs currently reveal a temperature of 98.5.  She denies any recent fever, pulse is 84, respirations 18, blood pressure 114/72, oxygen saturation 95%.  Neurologic status is at baseline according to her husband who is an Retail banker.  Lungs clear.  Heart regular with no murmurs.  Abdomen soft and nontender.  IMPRESSION AND PLAN: 1. Urinary tract infection.  Urine cultures pending.  Continue IV     Rocephin. 2. Vasovagal episode.  She is currently hemodynamically stable and  neurologically stable.  Her CT scan of the brain reveals chronic     atrophy, small vessel changes and old left cerebral infarcts with     no evidence of acute hemorrhage, mass, lesion or infarct. 3. Dehydration.  BUN and creatinine have now normalized. 4. Carotid artery disease.  Continue Plavix, Crestor, and lisinopril.     HCTZ will be held for now.     Paula Compton. Willey Blade, MD     ROF/MEDQ  D:  05/14/2011  T:  05/14/2011  Job:  [id number]

## 2011-05-15 NOTE — Progress Notes (Signed)
Discharged home with family.

## 2011-05-15 NOTE — Progress Notes (Signed)
Subjective: She says she's much improved. She feels like she's back to baseline. She has not had anymore episodes of change in level of consciousness  Objective: Vital signs in last 24 hours: Temp:  [98.2 F (36.8 C)-98.5 F (36.9 C)] 98.2 F (36.8 C) (12/10 [id number]) Pulse Rate:  [77-88] 77  (12/10 0608) Resp:  [18-20] 20  (12/10 [id number]) BP: (114-164)/(72-78) 164/78 mmHg (12/10 0608) SpO2:  [77 %-95 %] 77 % (12/10 [id number]) Weight change:  Last BM Date: 05/14/11  Intake/Output from previous day: 12/09 0701 - 12/10 0700 In: 3473.7 [P.O.:720; I.V.:2753.7] Out: 3 [Urine:3]  PHYSICAL EXAM General appearance: alert, cooperative and no distress Resp: clear to auscultation bilaterally Cardio: regular rate and rhythm, S1, S2 normal, no murmur, click, rub or gallop GI: soft, non-tender; bowel sounds normal; no masses,  no organomegaly Extremities: extremities normal, atraumatic, no cyanosis or edema  Lab Results:    Basic Metabolic Panel:  Basename 05/14/11 0757 05/13/11 2318  NA 142 141  K 4.1 3.7  CL 111 103  CO2 25 30  GLUCOSE 102* 118*  BUN 20 24*  CREATININE 0.90 1.15*  CALCIUM 7.9* 8.7  MG -- --  PHOS -- --   Liver Function Tests:  Basename 05/13/11 2318  AST 13  ALT 17  ALKPHOS 76  BILITOT 0.4  PROT 5.4*  ALBUMIN 3.2*   No results found for this basename: LIPASE:2,AMYLASE:2 in the last 72 hours No results found for this basename: AMMONIA:2 in the last 72 hours CBC:  Basename 05/14/11 0757 05/13/11 2318  WBC 9.6 16.8*  NEUTROABS -- 13.7*  HGB 11.6* 12.4  HCT 35.4* 37.9  MCV 86.6 86.7  PLT 272 283   Cardiac Enzymes:  Basename 05/14/11 1543 05/14/11 0757 05/14/11  CKTOTAL 67 65 54  CKMB 2.2 2.2 2.0  CKMBINDEX -- -- --  TROPONINI <0.30 <0.30 <0.30   BNP: No results found for this basename: POCBNP:3 in the last 72 hours D-Dimer: No results found for this basename: DDIMER:2 in the last 72 hours CBG: No results found for this basename: GLUCAP:6 in the  last 72 hours Hemoglobin A1C: No results found for this basename: HGBA1C in the last 72 hours Fasting Lipid Panel: No results found for this basename: CHOL,HDL,LDLCALC,TRIG,CHOLHDL,LDLDIRECT in the last 72 hours Thyroid Function Tests: No results found for this basename: TSH,T4TOTAL,FREET4,T3FREE,THYROIDAB in the last 72 hours Anemia Panel: No results found for this basename: VITAMINB12,FOLATE,FERRITIN,TIBC,IRON,RETICCTPCT in the last 72 hours Coagulation: No results found for this basename: LABPROT:2,INR:2 in the last 72 hours Urine Drug Screen: Drugs of Abuse  No results found for this basename: labopia, cocainscrnur, labbenz, amphetmu, thcu, labbarb    Alcohol Level: No results found for this basename: ETH:2 in the last 72 hours Urinalysis:  Misc. Labs:  ABGS No results found for this basename: PHART,PCO2,PO2ART,TCO2,HCO3 in the last 72 hours CULTURES Recent Results (from the past 240 hour(s))  CULTURE, BLOOD (ROUTINE X 2)     Status: Normal (Preliminary result)   Collection Time   05/14/11 12:15 AM      Component Value Range Status Comment   Specimen Description BLOOD BLOOD LEFT ARM   Final    Special Requests     Final    Value: BOTTLES DRAWN AEROBIC AND ANAEROBIC 6CC DRAWN BY RN   Culture PENDING   Incomplete    Report Status PENDING   Incomplete   CULTURE, BLOOD (ROUTINE X 2)     Status: Normal (Preliminary result)   Collection Time   05/14/11  12:19 AM      Component Value Range Status Comment   Specimen Description BLOOD RIGHT ANTECUBITAL   Final    Special Requests BOTTLES DRAWN AEROBIC AND ANAEROBIC 8CC   Final    Culture PENDING   Incomplete    Report Status PENDING   Incomplete    Studies/Results: Dg Chest 2 View  05/14/2011  *RADIOLOGY REPORT*  Clinical Data: Shortness of breath.  Altered mental status.  CHEST - 2 VIEW  Comparison: None.  Findings: Shallow inspiration.  Mild cardiac enlargement with normal pulmonary vascularity.  No focal airspace consolidation  in the lungs.  No blunting of costophrenic angles.  No pneumothorax. Tortuous and calcified aorta.  Degenerative changes in the thoracic spine.  Patchy sclerosis in the proximal left humerus probably represents enchondroma or bone infarct.  No periosteal reaction or expansile changes.  IMPRESSION: Shallow inspiration.  No evidence of active pulmonary disease. 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  Original Report Authenticated By: Neale Burly, M.D.   Ct Head Wo Contrast  05/14/2011  *RADIOLOGY REPORT*  Clinical Data: Loss of consciousness.  Altered mental status. Hypotension.  CT HEAD WITHOUT CONTRAST  Technique:  Contiguous axial images were obtained from the base of the skull through the vertex without contrast.  Comparison: None.  Findings: Diffuse cerebral and cerebellar atrophy.  Cerebral volume loss and encephalomalacia in the left anterior temporal lobe with patchy low attenuation changes in the deep white matter on the left and in the left posterior parietal region consistent with old infarcts.  Patchy low attenuation changes bilaterally in the deep white matter consistent with chronic small vessel ischemia.  No acute sulci effacement.  No midline shift.  No abnormal extra-axial fluid collections.  Basal cisterns are not effaced.  No evidence of acute intracranial hemorrhage.  Vascular calcifications in the intracranial arteries.  No depressed skull fractures.  Hypoaeration and opacification of the mastoid air cells bilaterally.  Visualized paranasal sinuses are not opacified.  IMPRESSION: Chronic atrophy, small vessel ischemic change, and old left cerebral infarcts.  No evidence of acute hemorrhage, mass lesion, or acute infarct.  Opacification of the mastoid air cells.  Original Report Authenticated By: Neale Burly, M.D.   US Carotid Duplex Bilateral  05/14/2011  *RADIOLOGY REPORT*  Clinical Data: Stroke.  No new left internal carotid occlusion.  BILATERAL  CAROTID DUPLEX ULTRASOUND  Technique: Pearline Cables scale imaging, color Doppler and duplex ultrasound was performed of bilateral carotid and vertebral arteries in the neck.  Comparison:  None.  Criteria:  Quantification of carotid stenosis is based on velocity parameters that correlate the residual internal carotid diameter with NASCET-based stenosis levels, using the diameter of the distal internal carotid lumen as the denominator for stenosis measurement.  The following velocity measurements were obtained:                   PEAK SYSTOLIC/END DIASTOLIC RIGHT ICA:                        206cm/sec CCA:                        [id number] SYSTOLIC ICA/CCA RATIO:     0000000 DIASTOLIC ICA/CCA RATIO: ECA:                        99cm/sec  LEFT ICA:  544cm/sec CCA:                        [id number] SYSTOLIC ICA/CCA RATIO:     [id number] DIASTOLIC ICA/CCA RATIO: ECA:                        119cm/sec  Findings:  RIGHT CAROTID ARTERY: There is significant plaque in the bulb and a low resistance internal carotid Doppler wave form with sharp upstroke.  RIGHT VERTEBRAL ARTERY:  There is very poor flow in the right vertebral artery with retrograde flow.  LEFT CAROTID ARTERY: A focal jet just above the bulb indicating severe stenosis is identified.  Above the bulb, flow cannot be visualized compatible with the patient's known occlusion or near occlusion of the internal carotid artery.  LEFT VERTEBRAL ARTERY:  Antegrade with normal appearing flow.  IMPRESSION: 50-69% stenosis in the right internal carotid artery.  This would indicate progression of the patient's angiogram.  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  Findings compatible with the patient's known history of left internal carotid artery occlusion.  Original Report Authenticated By: Jamas Lav, M.D.    Medications:  Prior to Admission:  Prescriptions prior to admission  Medication Sig Dispense Refill  .  acetaminophen (TYLENOL) 500 MG tablet Take 500 mg by mouth at bedtime.        . clopidogrel (PLAVIX) 75 MG tablet Take 75 mg by mouth daily.        Marland Kitchen lisinopril-hydrochlorothiazide (PRINZIDE,ZESTORETIC) 20-12.5 MG per tablet Take 1 tablet by mouth daily.        . Multiple Vitamins-Minerals (CENTRUM ULTRA WOMENS) TABS Take 1 tablet by mouth daily.        . Rosuvastatin Calcium (CRESTOR PO) Take 20 mg by mouth.       . senna (SENOKOT) 8.6 MG tablet Take 1 tablet by mouth daily.         Scheduled:   . cefTRIAXone (ROCEPHIN)  IV  1 g Intravenous Q24H  . clopidogrel  75 mg Oral Daily  . lisinopril  20 mg Oral Daily  . rosuvastatin  20 mg Oral q1800  . DISCONTD: clopidogrel  75 mg Oral Q breakfast   Continuous:   . sodium chloride 50 mL/hr at 05/14/11 1545  . DISCONTD: 0.9 % NaCl with KCl 40 mEq / L 100 mL/hr at 05/14/11 0435   [id number] (ZOFRAN) IV, ondansetron  Assesment: She is much improved and wants to go home. Active Problems:  * No active hospital problems. *     Plan: Discharge home    LOS: 2 days   Terence Googe L 05/15/2011, 8:59 AM

## 2011-05-15 NOTE — Progress Notes (Signed)
CARE MANAGEMENT NOTE 05/15/2011  Patient:  [name], [name]   Account Number:  [id number]  Date Initiated:  05/15/2011  Documentation initiated by:  Claretha Cooper  Subjective/Objective Assessment:   Pt admitted with syncopal episode.     Action/Plan:   CM spoke to Pt and spouse. Discharging. Denies need for [facility] assistance.   Anticipated DC Date:  05/15/2011   Anticipated DC Plan:  [place]  CM consult      Choice offered to / List presented to:             Status of service:  Completed, signed off Medicare Important Message given?  YES (If response is "NO", the following Medicare IM given date fields will be blank) Date Medicare IM given:  05/15/2011 Date Additional Medicare IM given:    Discharge Disposition:  HOME/SELF CARE  Per UR Regulation:    Comments:  05/15/11 10:15 Kambree Krauss Dellia Nims RN BSN

## 2011-05-17 LAB — URINE CULTURE

## 2011-05-19 LAB — CULTURE, BLOOD (ROUTINE X 2): Culture: NO GROWTH

## 2011-05-24 NOTE — Discharge Summary (Signed)
Physician Discharge Summary  Patient ID: [name] MRN: [id number] DOB/AGE: 74-Jul-1936 74 y.o. Primary Care Physician:No primary provider on file. Admit date: 05/13/2011 Discharge date: 05/24/2011    Discharge Diagnoses:   Principal Problem:  *Vasovagal syncope Active Problems:  S/P stroke due to cerebrovascular disease  Hypertension  Urinary tract infection   Discharge Medication List as of 05/15/2011  9:59 AM    START taking these medications   Details  cefUROXime (CEFTIN) 250 MG tablet Take 1 tablet (250 mg total) by mouth 2 (two) times daily., Starting 05/15/2011, Until Thu 05/25/11, Print      CONTINUE these medications which have NOT CHANGED   Details  acetaminophen (TYLENOL) 500 MG tablet Take 500 mg by mouth at bedtime.  , Until Discontinued, Historical Med    clopidogrel (PLAVIX) 75 MG tablet Take 75 mg by mouth daily.  , Until Discontinued, Historical Med    lisinopril-hydrochlorothiazide (PRINZIDE,ZESTORETIC) 20-12.5 MG per tablet Take 1 tablet by mouth daily.  , Until Discontinued, Historical Med    Multiple Vitamins-Minerals (CENTRUM ULTRA WOMENS) TABS Take 1 tablet by mouth daily.  , Until Discontinued, Historical Med    Rosuvastatin Calcium (CRESTOR PO) Take 20 mg by mouth. , Until Discontinued, Historical Med    senna (SENOKOT) 8.6 MG tablet Take 1 tablet by mouth daily.  , Until Discontinued, Historical Med        Discharged Condition: Improved    Consults: None  Significant Diagnostic Studies: Dg Chest 2 View  05/14/2011  *RADIOLOGY REPORT*  Clinical Data: Shortness of breath.  Altered mental status.  CHEST - 2 VIEW  Comparison: None.  Findings: Shallow inspiration.  Mild cardiac enlargement with normal pulmonary vascularity.  No focal airspace consolidation in the lungs.  No blunting of costophrenic angles.  No pneumothorax. Tortuous and calcified aorta.  Degenerative changes in the thoracic spine.  Patchy sclerosis in the proximal left  humerus probably represents enchondroma or bone infarct.  No periosteal reaction or expansile changes.  IMPRESSION: Shallow inspiration.  No evidence of active pulmonary disease. 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  Original Report Authenticated By: Neale Burly, M.D.   Ct Head Wo Contrast  05/14/2011  *RADIOLOGY REPORT*  Clinical Data: Loss of consciousness.  Altered mental status. Hypotension.  CT HEAD WITHOUT CONTRAST  Technique:  Contiguous axial images were obtained from the base of the skull through the vertex without contrast.  Comparison: None.  Findings: Diffuse cerebral and cerebellar atrophy.  Cerebral volume loss and encephalomalacia in the left anterior temporal lobe with patchy low attenuation changes in the deep white matter on the left and in the left posterior parietal region consistent with old infarcts.  Patchy low attenuation changes bilaterally in the deep white matter consistent with chronic small vessel ischemia.  No acute sulci effacement.  No midline shift.  No abnormal extra-axial fluid collections.  Basal cisterns are not effaced.  No evidence of acute intracranial hemorrhage.  Vascular calcifications in the intracranial arteries.  No depressed skull fractures.  Hypoaeration and opacification of the mastoid air cells bilaterally.  Visualized paranasal sinuses are not opacified.  IMPRESSION: Chronic atrophy, small vessel ischemic change, and old left cerebral infarcts.  No evidence of acute hemorrhage, mass lesion, or acute infarct.  Opacification of the mastoid air cells.  Original Report Authenticated By: Neale Burly, M.D.   US Carotid Duplex Bilateral  05/14/2011  *RADIOLOGY REPORT*  Clinical Data: Stroke.  No new left internal carotid occlusion.  BILATERAL  CAROTID DUPLEX ULTRASOUND  Technique: Pearline Cables scale imaging, color Doppler and duplex ultrasound was performed of bilateral carotid and vertebral arteries in the neck.  Comparison:  None.   Criteria:  Quantification of carotid stenosis is based on velocity parameters that correlate the residual internal carotid diameter with NASCET-based stenosis levels, using the diameter of the distal internal carotid lumen as the denominator for stenosis measurement.  The following velocity measurements were obtained:                   PEAK SYSTOLIC/END DIASTOLIC RIGHT ICA:                        206cm/sec CCA:                        [id number] SYSTOLIC ICA/CCA RATIO:     0000000 DIASTOLIC ICA/CCA RATIO: ECA:                        99cm/sec  LEFT ICA:                        544cm/sec CCA:                        [id number] SYSTOLIC ICA/CCA RATIO:     [id number] DIASTOLIC ICA/CCA RATIO: ECA:                        119cm/sec  Findings:  RIGHT CAROTID ARTERY: There is significant plaque in the bulb and a low resistance internal carotid Doppler wave form with sharp upstroke.  RIGHT VERTEBRAL ARTERY:  There is very poor flow in the right vertebral artery with retrograde flow.  LEFT CAROTID ARTERY: A focal jet just above the bulb indicating severe stenosis is identified.  Above the bulb, flow cannot be visualized compatible with the patient's known occlusion or near occlusion of the internal carotid artery.  LEFT VERTEBRAL ARTERY:  Antegrade with normal appearing flow.  IMPRESSION: 50-69% stenosis in the right internal carotid artery.  This would indicate progression of the patient's angiogram.  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  Findings compatible with the patient's known history of left internal carotid artery occlusion.  Original Report Authenticated By: Jamas Lav, M.D.    Lab Results: Basic Metabolic Panel: No results found for this basename: NA:2,K:2,CL:2,CO2:2,GLUCOSE:2,BUN:2,CREATININE:2,CALCIUM:2,MG:2,PHOS:2 in the last 72 hours Liver Function Tests: No results found for this basename: AST:2,ALT:2,ALKPHOS:2,BILITOT:2,PROT:2,ALBUMIN:2 in the last 72  hours   CBC: No results found for this basename: WBC:2,NEUTROABS:2,HGB:2,HCT:2,MCV:2,PLT:2 in the last 72 hours  No results found for this or any previous visit (from the past 240 hour(s)).   Hospital Course: She was admitted with what appeared to be a syncopal episode. After she came to the hospital she was found to have a urinary tract infection and it was felt that she had a urinary tract infection that was complicated by vasovagal syncope. Over the next 36 hours associate improved markedly and was back to baseline and ready for discharge  Discharge Exam: Blood pressure 164/78, pulse 77, temperature 98.2 F (36.8 C), temperature source Oral, resp. rate 20, height 5\' 4"  (1.626 m), weight 82.1 kg (181 lb), SpO2 77.00%. She still has some speech deficit from her previous stroke. She is awake and alert. She is able to ambulate. She  does not show any focal neurological findings except for from her previous stroke.  Disposition: Home      Signed: Zamyra Allensworth Carlean Jews Pager [phone]  05/24/2011, 7:35 AM

## 2012-04-08 ENCOUNTER — Other Ambulatory Visit (HOSPITAL_COMMUNITY): Payer: Self-pay | Admitting: Pulmonary Disease

## 2012-04-08 DIAGNOSIS — I639 Cerebral infarction, unspecified: Secondary | ICD-10-CM

## 2012-04-09 ENCOUNTER — Other Ambulatory Visit (HOSPITAL_COMMUNITY): Payer: Self-pay | Admitting: Pulmonary Disease

## 2012-04-09 DIAGNOSIS — I639 Cerebral infarction, unspecified: Secondary | ICD-10-CM

## 2012-04-10 ENCOUNTER — Ambulatory Visit (HOSPITAL_COMMUNITY)
Admission: RE | Admit: 2012-04-10 | Discharge: 2012-04-10 | Disposition: A | Discharge status: Home / Self Care | Payer: Medicare Other | Source: Ambulatory Visit | Attending: Pulmonary Disease | Admitting: Pulmonary Disease

## 2012-04-10 DIAGNOSIS — I639 Cerebral infarction, unspecified: Secondary | ICD-10-CM

## 2012-04-10 DIAGNOSIS — I6529 Occlusion and stenosis of unspecified carotid artery: Secondary | ICD-10-CM | POA: Insufficient documentation

## 2012-04-10 DIAGNOSIS — I6350 Cerebral infarction due to unspecified occlusion or stenosis of unspecified cerebral artery: Secondary | ICD-10-CM | POA: Insufficient documentation

## 2012-04-11 ENCOUNTER — Ambulatory Visit (HOSPITAL_COMMUNITY)
Admission: RE | Admit: 2012-04-11 | Discharge: 2012-04-11 | Disposition: A | Discharge status: Home / Self Care | Payer: Medicare Other | Source: Ambulatory Visit | Attending: Pulmonary Disease | Admitting: Pulmonary Disease

## 2012-04-11 DIAGNOSIS — I639 Cerebral infarction, unspecified: Secondary | ICD-10-CM

## 2012-04-15 ENCOUNTER — Other Ambulatory Visit (HOSPITAL_COMMUNITY): Payer: Self-pay | Admitting: Interventional Radiology

## 2012-04-15 DIAGNOSIS — I6529 Occlusion and stenosis of unspecified carotid artery: Secondary | ICD-10-CM

## 2012-04-16 ENCOUNTER — Encounter (HOSPITAL_COMMUNITY): Payer: Self-pay | Admitting: Pharmacy Technician

## 2012-04-17 ENCOUNTER — Other Ambulatory Visit: Payer: Self-pay | Admitting: Physician Assistant

## 2012-04-19 ENCOUNTER — Encounter (HOSPITAL_COMMUNITY): Payer: Self-pay

## 2012-04-19 ENCOUNTER — Encounter (HOSPITAL_COMMUNITY)
Admission: RE | Admit: 2012-04-19 | Discharge: 2012-04-19 | Disposition: A | Discharge status: Home / Self Care | Payer: Medicare Other | Source: Ambulatory Visit | Attending: Interventional Radiology | Admitting: Interventional Radiology

## 2012-04-19 HISTORY — DX: Calculus of kidney: N20.0

## 2012-04-19 HISTORY — DX: Essential (primary) hypertension: I10

## 2012-04-19 HISTORY — DX: Personal history of other medical treatment: Z92.89

## 2012-04-19 LAB — CBC WITH DIFFERENTIAL/PLATELET
Basophils Relative: 1 % (ref 0–1)
Eosinophils Absolute: 0.8 10*3/uL — ABNORMAL HIGH (ref 0.0–0.7)
Eosinophils Relative: 10 % — ABNORMAL HIGH (ref 0–5)
MCH: 26.7 pg (ref 26.0–34.0)
MCHC: 32 g/dL (ref 30.0–36.0)
MCV: 83.4 fL (ref 78.0–100.0)
Neutrophils Relative %: 65 % (ref 43–77)
Platelets: 299 10*3/uL (ref 150–400)

## 2012-04-19 LAB — BASIC METABOLIC PANEL
BUN: 28 mg/dL — ABNORMAL HIGH (ref 6–23)
Calcium: 9.7 mg/dL (ref 8.4–10.5)
Creatinine, Ser: 1.1 mg/dL (ref 0.50–1.10)
GFR calc non Af Amer: 48 mL/min — ABNORMAL LOW (ref >90)
Glucose, Bld: 90 mg/dL (ref 70–99)

## 2012-04-19 LAB — SURGICAL PCR SCREEN
MRSA, PCR: NEGATIVE
Staphylococcus aureus: NEGATIVE

## 2012-04-19 LAB — PROTIME-INR: Prothrombin Time: 13.4 seconds (ref 11.6–15.2)

## 2012-04-19 LAB — PLATELET INHIBITION P2Y12: Platelet Function  P2Y12: 103 [PRU] — ABNORMAL LOW (ref 194–418)

## 2012-04-19 NOTE — Pre-Procedure Instructions (Signed)
20 SHANEIL TAPIA  04/19/2012   Your procedure is scheduled on:  Monday, November 18th  Report to [facility].  Call this number if you have problems the morning of surgery: [phone]   Remember:   Do not eat food or drink:After Midnight.   Take these medicines the morning of surgery with A SIP OF WATER: plavix   Do not wear jewelry, make-up or nail polish.  Do not wear lotions, powders, or perfumes.   Do not shave 48 hours prior to surgery.  Do not bring valuables to the hospital.  Contacts, dentures or bridgework may not be worn into surgery.  Leave suitcase in the car. After surgery it may be brought to your room.  For patients admitted to the hospital, checkout time is 11:00 AM the day of discharge.   Patients discharged the day of surgery will not be allowed to drive home.   Special Instructions: Shower using CHG 2 nights before surgery and the night before surgery.  If you shower the day of surgery use CHG.  Use special wash - you have one bottle of CHG for all showers.  You should use approximately 1/3 of the bottle for each shower.   Please read over the following fact sheets that you were given: Pain Booklet, Coughing and Deep Breathing, MRSA Information and Surgical Site Infection Prevention

## 2012-04-19 NOTE — Progress Notes (Signed)
Primary Physician - Dr. Luan Pulling - Linna Hoff Does not have a cardiologist. EKG - Dec 2012 in epic NO other cardiac testing

## 2012-04-21 MED ORDER — CLOPIDOGREL BISULFATE 75 MG PO TABS: 75.0000 mg | ORAL_TABLET | ORAL | Status: DC

## 2012-04-21 MED ORDER — NIMODIPINE 30 MG PO CAPS
60.0000 mg | ORAL_CAPSULE | ORAL | Status: AC
  Administered 2012-04-22: 60 mg via ORAL
  Filled 2012-04-21: qty 2

## 2012-04-21 MED ORDER — ASPIRIN EC 325 MG PO TBEC
325.0000 mg | DELAYED_RELEASE_TABLET | ORAL | Status: AC
Start: 2012-04-22 — End: 2012-04-22
  Administered 2012-04-22: 325 mg via ORAL
  Filled 2012-04-21: qty 1

## 2012-04-21 MED ORDER — CEFAZOLIN SODIUM-DEXTROSE 2-3 GM-% IV SOLR
2.0000 g | Freq: Once | INTRAVENOUS | Status: AC
  Administered 2012-04-22: 2 g via INTRAVENOUS
  Filled 2012-04-21: qty 50

## 2012-04-22 ENCOUNTER — Encounter (HOSPITAL_COMMUNITY): Payer: Self-pay

## 2012-04-22 ENCOUNTER — Encounter (HOSPITAL_COMMUNITY): Payer: Self-pay | Admitting: Anesthesiology

## 2012-04-22 ENCOUNTER — Ambulatory Visit (HOSPITAL_COMMUNITY)
Admission: RE | Admit: 2012-04-22 | Discharge: 2012-04-22 | Disposition: A | Discharge status: Home / Self Care | Payer: Medicare Other | Source: Ambulatory Visit | Attending: Interventional Radiology | Admitting: Interventional Radiology

## 2012-04-22 ENCOUNTER — Ambulatory Visit (HOSPITAL_COMMUNITY): Payer: Medicare Other | Admitting: Anesthesiology

## 2012-04-22 ENCOUNTER — Encounter (HOSPITAL_COMMUNITY)
Admission: RE | Disposition: A | Discharge status: Home / Self Care | Payer: Self-pay | Source: Ambulatory Visit | Attending: Interventional Radiology

## 2012-04-22 ENCOUNTER — Encounter (HOSPITAL_COMMUNITY): Payer: Self-pay | Admitting: *Deleted

## 2012-04-22 VITALS — BP 168/86 | HR 92

## 2012-04-22 DIAGNOSIS — I6529 Occlusion and stenosis of unspecified carotid artery: Secondary | ICD-10-CM

## 2012-04-22 DIAGNOSIS — I671 Cerebral aneurysm, nonruptured: Secondary | ICD-10-CM | POA: Insufficient documentation

## 2012-04-22 DIAGNOSIS — I658 Occlusion and stenosis of other precerebral arteries: Secondary | ICD-10-CM | POA: Insufficient documentation

## 2012-04-22 DIAGNOSIS — I10 Essential (primary) hypertension: Secondary | ICD-10-CM | POA: Insufficient documentation

## 2012-04-22 DIAGNOSIS — Z01812 Encounter for preprocedural laboratory examination: Secondary | ICD-10-CM | POA: Insufficient documentation

## 2012-04-22 DIAGNOSIS — Z8673 Personal history of transient ischemic attack (TIA), and cerebral infarction without residual deficits: Secondary | ICD-10-CM | POA: Insufficient documentation

## 2012-04-22 DIAGNOSIS — E7800 Pure hypercholesterolemia, unspecified: Secondary | ICD-10-CM | POA: Insufficient documentation

## 2012-04-22 HISTORY — PX: RADIOLOGY WITH ANESTHESIA: SHX6223

## 2012-04-22 SURGERY — RADIOLOGY WITH ANESTHESIA
Anesthesia: Monitor Anesthesia Care

## 2012-04-22 MED ORDER — NITROGLYCERIN 5 MG/ML IV SOLN
1.5000 mg | INTRAVENOUS | Status: DC
  Filled 2012-04-22: qty 0.3

## 2012-04-22 MED ORDER — LACTATED RINGERS IV SOLN
INTRAVENOUS | Status: DC | PRN
  Administered 2012-04-22: 08:00:00 via INTRAVENOUS

## 2012-04-22 MED ORDER — IOHEXOL 300 MG/ML  SOLN
150.0000 mL | Freq: Once | INTRAMUSCULAR | Status: AC | PRN
  Administered 2012-04-22: 90 mL via INTRA_ARTERIAL

## 2012-04-22 MED ORDER — FENTANYL CITRATE 0.05 MG/ML IJ SOLN
INTRAMUSCULAR | Status: DC | PRN
  Administered 2012-04-22: 50 ug via INTRAVENOUS

## 2012-04-22 MED ORDER — HEPARIN SODIUM (PORCINE) 1000 UNIT/ML IJ SOLN
INTRAMUSCULAR | Status: DC | PRN
  Administered 2012-04-22: 1000 [IU] via INTRAVENOUS

## 2012-04-22 MED ORDER — SODIUM CHLORIDE 0.9 % IV SOLN: INTRAVENOUS | Status: AC

## 2012-04-22 MED ORDER — HYDROMORPHONE HCL PF 1 MG/ML IJ SOLN: 0.2500 mg | INTRAMUSCULAR | Status: DC | PRN

## 2012-04-22 MED ORDER — PROPOFOL INFUSION 10 MG/ML OPTIME
INTRAVENOUS | Status: DC | PRN
  Administered 2012-04-22: 25 ug/kg/min via INTRAVENOUS

## 2012-04-22 MED ORDER — SODIUM CHLORIDE 0.9 % IV SOLN: INTRAVENOUS | Status: DC

## 2012-04-22 MED ORDER — SODIUM CHLORIDE 0.9 % IV SOLN
10.0000 mg | INTRAVENOUS | Status: DC | PRN
  Administered 2012-04-22: 10 ug/min via INTRAVENOUS

## 2012-04-22 NOTE — Anesthesia Preprocedure Evaluation (Addendum)
Anesthesia Evaluation  Patient identified by MRN, date of birth, ID band Patient awake    Reviewed: Allergy & Precautions, H&P , NPO status , Patient's Chart, lab work & pertinent test results  Airway Mallampati: II      Dental  (+) Teeth Intact   Pulmonary neg pulmonary ROS,  breath sounds clear to auscultation        Cardiovascular hypertension, Pt. on medications Rhythm:Regular Rate:Normal     Neuro/Psych CVA, Residual Symptoms    GI/Hepatic negative GI ROS, Neg liver ROS,   Endo/Other  negative endocrine ROS  Renal/GU Renal disease     Musculoskeletal   Abdominal   Peds  Hematology negative hematology ROS (+)   Anesthesia Other Findings   Reproductive/Obstetrics                          Anesthesia Physical Anesthesia Plan  ASA: III  Anesthesia Plan: General   Post-op Pain Management:    Induction: Intravenous  Airway Management Planned: Oral ETT  Additional Equipment: Arterial line  Intra-op Plan:   Post-operative Plan: Extubation in OR  Informed Consent: I have reviewed the patients History and Physical, chart, labs and discussed the procedure including the risks, benefits and alternatives for the proposed anesthesia with the patient or authorized representative who has indicated his/her understanding and acceptance.   Dental advisory given  Plan Discussed with: CRNA, Anesthesiologist and Surgeon  Anesthesia Plan Comments:        Anesthesia Quick Evaluation

## 2012-04-22 NOTE — Progress Notes (Signed)
Plavix not given in short stay due to patient taking home dose this am

## 2012-04-22 NOTE — Transfer of Care (Signed)
Immediate Anesthesia Transfer of Care Note  Patient: [name]  Procedure(s) Performed: Procedure(s) (LRB) with comments: RADIOLOGY WITH ANESTHESIA (N/A) - VERTEBRAL ARTERY STENT PLACEMENT AND CEREBRAL ANGIOGRAM  Patient Location: NICU  Anesthesia Type:MAC  Level of Consciousness: awake, alert  and oriented  Airway & Oxygen Therapy: Patient Spontanous Breathing and Patient connected to nasal cannula oxygen  Post-op Assessment: Report given to PACU RN, Post -op Vital signs reviewed and stable and Patient moving all extremities  Post vital signs: Reviewed and stable  Complications: No apparent anesthesia complications

## 2012-04-22 NOTE — Preoperative (Signed)
Beta Blockers   Reason not to administer Beta Blockers:Not Applicable 

## 2012-04-22 NOTE — Anesthesia Postprocedure Evaluation (Signed)
  Anesthesia Post-op Note  Patient: [name]  Procedure(s) Performed: Procedure(s) (LRB) with comments: RADIOLOGY WITH ANESTHESIA (N/A) - VERTEBRAL ARTERY STENT PLACEMENT AND CEREBRAL ANGIOGRAM  Patient Location: PACU  Anesthesia Type:MAC  Level of Consciousness: awake  Airway and Oxygen Therapy: Patient Spontanous Breathing  Post-op Pain: mild  Post-op Assessment: Post-op Vital signs reviewed  Post-op Vital Signs: Reviewed  Complications: No apparent anesthesia complications

## 2012-04-22 NOTE — H&P (Signed)
Chief Complaint: Right carotid stenosis HPI: [name] is an 77 y.o. female with severe rt carotid stenosis and recent CVA See PACS consult by Dr. Estanislado Pandy for details. She is scheduled for Arteriogram and anticipated intervention to stent right carotid. No recent illnesses or infectious issues. Pt has been taking Plavix and receive both Plavix and ASA this am.  Past Medical History:  Past Medical History  Diagnosis Date  . High cholesterol   . Hearing impaired person   . Hypertension   . Stroke     speech slightly , right sided weakness  . Kidney stones   . Degenerative joint disease of knee, right     right knee  . History of blood transfusion     Past Surgical History:  Past Surgical History  Procedure Date  . Kidney stones   . Lithotripsy   . Peg placement     and removal  . Peg tube removal   . Tympanoplasty   . Tonsillectomy   . Appendectomy     Family History: No family history on file.  Social History:  reports that she has quit smoking. She does not have any smokeless tobacco history on file. She reports that she drinks alcohol. She reports that she does not use illicit drugs.  Allergies: No Known Allergies  Medications: clopidogrel (PLAVIX) 75 MG tablet (Taking) Sig - Route: Take 75 mg by mouth daily. - Oral Class: Historical Med Number of times this order has been changed since signing: 2 Order [address] lisinopril-hydrochlorothiazide (PRINZIDE,ZESTORETIC) 20-12.5 MG per tablet (Taking) Sig - Route: Take 1 tablet by mouth daily. - Oral Class: Historical Med Number of times this order has been changed since signing: 2 Order [address] Multiple Vitamins-Minerals (CENTRUM ULTRA WOMENS) TABS (Taking) Sig - Route: Take 1 tablet by mouth daily. - Oral Class: Historical Med Number of times this order has been changed since signing: 1 Order [address] naproxen sodium (ANAPROX) 220 MG tablet (Taking) Sig - Route: Take 220 mg by mouth daily as needed. For pain -  Oral Class: Historical Med Number of times this order has been changed since signing: 1 Order [address] rosuvastatin (CRESTOR) 20 MG tablet (Taking) Sig - Route: Take 20 mg by mouth daily. - Oral Class: Historical Med Number of times this order has been changed since signing: 1 Order [address] senna (SENOKOT) 8.6 MG tablet (Taking) Sig - Route: Take 1 tablet by mouth daily. - Oral Class: Historical Med   Please HPI for pertinent positives, otherwise complete 10 system ROS negative.  Physical Exam: There were no vitals taken for this visit. There is no height or weight on file to calculate BMI.   General Appearance:  Alert, cooperative, no distress, appears stated age  Head:  Normocephalic, without obvious abnormality, atraumatic  ENT: Unremarkable  Neck: Supple, symmetrical, trachea midline, no adenopathy, thyroid: not enlarged, symmetric, no tenderness/mass/nodules  Lungs:   Clear to auscultation bilaterally, no w/r/r, respirations unlabored without use of accessory muscles.  Heart:  Regular rate and rhythm, S1, S2 normal, no murmur, rub or gallop. Carotids 2+ without bruit.  Extremities: Extremities normal, atraumatic, no cyanosis or edema  Pulses: 2+ and symmetric  Neurologic: Normal affect, no gross deficits.   CBC    Component Value Date/Time   WBC 7.4 04/19/2012 1233   RBC 4.94 04/19/2012 1233   HGB 13.2 04/19/2012 1233   HCT 41.2 04/19/2012 1233   PLT 299 04/19/2012 1233   MCV 83.4 04/19/2012 1233  MCH 26.7 04/19/2012 1233   MCHC 32.0 04/19/2012 1233   RDW 13.2 04/19/2012 1233   LYMPHSABS 1.1 04/19/2012 1233   MONOABS 0.7 04/19/2012 1233   EOSABS 0.8* 04/19/2012 1233   BASOSABS 0.1 04/19/2012 1233    BMET    Component Value Date/Time   NA 143 04/19/2012 1233   K 3.4* 04/19/2012 1233   CL 104 04/19/2012 1233   CO2 30 04/19/2012 1233   GLUCOSE 90 04/19/2012 1233   BUN 28* 04/19/2012 1233   CREATININE 1.10 04/19/2012 1233   CALCIUM 9.7 04/19/2012 1233    GFRNONAA 48* 04/19/2012 1233   GFRAA 55* 04/19/2012 1233    Prothrombin Time/INR  P2Y12 1.03     103  Assessment/Plan CVA Rt carotid stenosis For arteriogram and likely intervention with general anesthesia. Labs reviewed. Procedure reviewed and risks discussed. Consent signed in chart  Ascencion Dike PA-C 04/22/2012, 8:14 AM

## 2012-04-22 NOTE — OR Nursing (Signed)
r groin remains soft/ nontender

## 2012-04-22 NOTE — Procedures (Signed)
S/P Bilateral carotid and lt vertebral arteriograms. RT CFA approach  Findings. 1. Appro 40 % RT ICA stenosis at the bulb. 2. Severe stable LT ICA stsenis at the bulb. 3. 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

## 2012-04-23 ENCOUNTER — Encounter (HOSPITAL_COMMUNITY): Payer: Self-pay | Admitting: Interventional Radiology

## 2012-04-23 ENCOUNTER — Telehealth (HOSPITAL_COMMUNITY): Payer: Self-pay | Admitting: *Deleted

## 2012-08-21 ENCOUNTER — Other Ambulatory Visit: Payer: Self-pay | Admitting: Radiology

## 2012-08-21 DIAGNOSIS — I6529 Occlusion and stenosis of unspecified carotid artery: Secondary | ICD-10-CM

## 2012-10-02 ENCOUNTER — Other Ambulatory Visit (HOSPITAL_COMMUNITY): Payer: Self-pay | Admitting: Interventional Radiology

## 2012-10-02 DIAGNOSIS — I639 Cerebral infarction, unspecified: Secondary | ICD-10-CM

## 2012-10-04 ENCOUNTER — Other Ambulatory Visit: Payer: Self-pay | Admitting: Radiology

## 2012-10-04 ENCOUNTER — Ambulatory Visit (HOSPITAL_COMMUNITY)
Admission: RE | Admit: 2012-10-04 | Discharge: 2012-10-04 | Disposition: A | Discharge status: Home / Self Care | Payer: Medicare Other | Source: Ambulatory Visit | Attending: Interventional Radiology | Admitting: Interventional Radiology

## 2012-10-04 DIAGNOSIS — I6523 Occlusion and stenosis of bilateral carotid arteries: Secondary | ICD-10-CM

## 2012-10-04 DIAGNOSIS — I639 Cerebral infarction, unspecified: Secondary | ICD-10-CM

## 2012-11-12 ENCOUNTER — Other Ambulatory Visit: Payer: Self-pay | Admitting: Radiology

## 2012-11-12 DIAGNOSIS — I6529 Occlusion and stenosis of unspecified carotid artery: Secondary | ICD-10-CM

## 2012-11-22 ENCOUNTER — Ambulatory Visit (HOSPITAL_COMMUNITY)
Admission: RE | Admit: 2012-11-22 | Discharge: 2012-11-22 | Disposition: A | Discharge status: Home / Self Care | Payer: Medicare Other | Source: Ambulatory Visit | Attending: Interventional Radiology | Admitting: Interventional Radiology

## 2012-11-22 DIAGNOSIS — I6529 Occlusion and stenosis of unspecified carotid artery: Secondary | ICD-10-CM

## 2012-11-22 DIAGNOSIS — I658 Occlusion and stenosis of other precerebral arteries: Secondary | ICD-10-CM | POA: Insufficient documentation

## 2012-11-22 LAB — PLATELET INHIBITION P2Y12: Platelet Function  P2Y12: 131 [PRU] — ABNORMAL LOW (ref 194–418)

## 2012-11-22 NOTE — Progress Notes (Signed)
*  PRELIMINARY RESULTS* Vascular Ultrasound Carotid Duplex (Doppler) has been completed.  Preliminary findings:  Right = 60-79% ICA stenosis. Left = >80% ICA stenosis.  Antegrade vertebral flow.   Landry Mellow, RDMS, RVT  11/22/2012, 11:45 AM

## 2012-11-27 ENCOUNTER — Other Ambulatory Visit (HOSPITAL_COMMUNITY): Payer: Self-pay | Admitting: Interventional Radiology

## 2012-11-27 DIAGNOSIS — I771 Stricture of artery: Secondary | ICD-10-CM

## 2012-12-10 ENCOUNTER — Ambulatory Visit (HOSPITAL_COMMUNITY): Admission: RE | Admit: 2012-12-10 | Payer: Medicare Other | Source: Ambulatory Visit

## 2012-12-16 ENCOUNTER — Ambulatory Visit (HOSPITAL_COMMUNITY): Admission: RE | Admit: 2012-12-16 | Payer: Medicare Other | Source: Ambulatory Visit

## 2012-12-19 ENCOUNTER — Ambulatory Visit (HOSPITAL_COMMUNITY)
Admission: RE | Admit: 2012-12-19 | Discharge: 2012-12-19 | Disposition: A | Discharge status: Home / Self Care | Payer: Medicare Other | Source: Ambulatory Visit | Attending: Interventional Radiology | Admitting: Interventional Radiology

## 2012-12-19 DIAGNOSIS — I771 Stricture of artery: Secondary | ICD-10-CM

## 2013-06-25 ENCOUNTER — Other Ambulatory Visit: Payer: Self-pay | Admitting: Radiology

## 2013-06-25 DIAGNOSIS — I6529 Occlusion and stenosis of unspecified carotid artery: Secondary | ICD-10-CM

## 2013-08-05 ENCOUNTER — Telehealth (HOSPITAL_COMMUNITY): Payer: Self-pay | Admitting: Interventional Radiology

## 2013-08-05 NOTE — Telephone Encounter (Signed)
Called pt's husband and they feel like the US's are an effective source of f/u and they are not interested in continuing with this type of f/u in the future. JM

## 2013-08-05 NOTE — Telephone Encounter (Signed)
Called pt's husband back after speaking to Dr. Estanislado Pandy concerning the pt's f/u studies. Told pt's spouse that Estanislado Pandy says this patient needs to be followed regularly due to her hx and condition, also that her disease could be progressing without her showing any physical symptoms. Deveshwar recommended a catheter angiogram. The pt's husband said the he would talk to the pt and call me back. He states that they may do f/u if it is the most accurate test (cerebral angiogram) and not the US carotid studies, which they are feel are not accurate at all. He will call me back with their decision. JM

## 2014-06-12 ENCOUNTER — Emergency Department (HOSPITAL_COMMUNITY): Payer: PPO

## 2014-06-12 ENCOUNTER — Inpatient Hospital Stay (HOSPITAL_COMMUNITY): Payer: PPO | Admitting: Certified Registered Nurse Anesthetist

## 2014-06-12 ENCOUNTER — Encounter (HOSPITAL_COMMUNITY)
Admission: EM | Disposition: A | Discharge status: Skilled Nursing Facility | Payer: Self-pay | Source: Home / Self Care | Attending: Internal Medicine

## 2014-06-12 ENCOUNTER — Encounter (HOSPITAL_COMMUNITY): Payer: Self-pay | Admitting: *Deleted

## 2014-06-12 ENCOUNTER — Inpatient Hospital Stay (HOSPITAL_COMMUNITY)
Admission: EM | Admit: 2014-06-12 | Discharge: 2014-06-16 | DRG: 481 | Disposition: A | Discharge status: Skilled Nursing Facility | Payer: PPO | Attending: Internal Medicine | Admitting: Internal Medicine

## 2014-06-12 ENCOUNTER — Inpatient Hospital Stay (HOSPITAL_COMMUNITY): Payer: PPO

## 2014-06-12 DIAGNOSIS — D62 Acute posthemorrhagic anemia: Secondary | ICD-10-CM | POA: Diagnosis not present

## 2014-06-12 DIAGNOSIS — R319 Hematuria, unspecified: Secondary | ICD-10-CM | POA: Diagnosis not present

## 2014-06-12 DIAGNOSIS — S7221XA Displaced subtrochanteric fracture of right femur, initial encounter for closed fracture: Secondary | ICD-10-CM | POA: Diagnosis present

## 2014-06-12 DIAGNOSIS — Z87442 Personal history of urinary calculi: Secondary | ICD-10-CM | POA: Diagnosis not present

## 2014-06-12 DIAGNOSIS — W19XXXA Unspecified fall, initial encounter: Secondary | ICD-10-CM | POA: Diagnosis present

## 2014-06-12 DIAGNOSIS — S72001A Fracture of unspecified part of neck of right femur, initial encounter for closed fracture: Secondary | ICD-10-CM | POA: Diagnosis not present

## 2014-06-12 DIAGNOSIS — S72141A Displaced intertrochanteric fracture of right femur, initial encounter for closed fracture: Principal | ICD-10-CM | POA: Diagnosis present

## 2014-06-12 DIAGNOSIS — E780 Pure hypercholesterolemia: Secondary | ICD-10-CM | POA: Diagnosis present

## 2014-06-12 DIAGNOSIS — I517 Cardiomegaly: Secondary | ICD-10-CM | POA: Diagnosis present

## 2014-06-12 DIAGNOSIS — I77819 Aortic ectasia, unspecified site: Secondary | ICD-10-CM | POA: Diagnosis present

## 2014-06-12 DIAGNOSIS — E785 Hyperlipidemia, unspecified: Secondary | ICD-10-CM | POA: Diagnosis present

## 2014-06-12 DIAGNOSIS — I6521 Occlusion and stenosis of right carotid artery: Secondary | ICD-10-CM | POA: Diagnosis present

## 2014-06-12 DIAGNOSIS — N390 Urinary tract infection, site not specified: Secondary | ICD-10-CM

## 2014-06-12 DIAGNOSIS — H9190 Unspecified hearing loss, unspecified ear: Secondary | ICD-10-CM | POA: Diagnosis present

## 2014-06-12 DIAGNOSIS — Z87891 Personal history of nicotine dependence: Secondary | ICD-10-CM

## 2014-06-12 DIAGNOSIS — I10 Essential (primary) hypertension: Secondary | ICD-10-CM | POA: Diagnosis present

## 2014-06-12 DIAGNOSIS — S7291XA Unspecified fracture of right femur, initial encounter for closed fracture: Secondary | ICD-10-CM

## 2014-06-12 DIAGNOSIS — S72009A Fracture of unspecified part of neck of unspecified femur, initial encounter for closed fracture: Secondary | ICD-10-CM | POA: Diagnosis not present

## 2014-06-12 DIAGNOSIS — I69351 Hemiplegia and hemiparesis following cerebral infarction affecting right dominant side: Secondary | ICD-10-CM | POA: Diagnosis not present

## 2014-06-12 DIAGNOSIS — N179 Acute kidney failure, unspecified: Secondary | ICD-10-CM | POA: Insufficient documentation

## 2014-06-12 DIAGNOSIS — Z7902 Long term (current) use of antithrombotics/antiplatelets: Secondary | ICD-10-CM | POA: Diagnosis not present

## 2014-06-12 DIAGNOSIS — R4701 Aphasia: Secondary | ICD-10-CM | POA: Insufficient documentation

## 2014-06-12 HISTORY — PX: INTRAMEDULLARY (IM) NAIL INTERTROCHANTERIC: SHX5875

## 2014-06-12 LAB — URINE MICROSCOPIC-ADD ON

## 2014-06-12 LAB — CBC WITH DIFFERENTIAL/PLATELET
Basophils Absolute: 0.1 10*3/uL (ref 0.0–0.1)
Basophils Relative: 1 % (ref 0–1)
Eosinophils Absolute: 0.3 10*3/uL (ref 0.0–0.7)
Eosinophils Relative: 3 % (ref 0–5)
HCT: 39.2 % (ref 36.0–46.0)
Hemoglobin: 12.3 g/dL (ref 12.0–15.0)
Lymphocytes Relative: 11 % — ABNORMAL LOW (ref 12–46)
Lymphs Abs: 1.2 10*3/uL (ref 0.7–4.0)
MCH: 25.6 pg — ABNORMAL LOW (ref 26.0–34.0)
MCHC: 31.4 g/dL (ref 30.0–36.0)
MCV: 81.7 fL (ref 78.0–100.0)
Monocytes Absolute: 0.8 10*3/uL (ref 0.1–1.0)
Monocytes Relative: 7 % (ref 3–12)
Neutro Abs: 8.4 10*3/uL — ABNORMAL HIGH (ref 1.7–7.7)
Neutrophils Relative %: 78 % — ABNORMAL HIGH (ref 43–77)
Platelets: 309 10*3/uL (ref 150–400)
RBC: 4.8 MIL/uL (ref 3.87–5.11)
RDW: 14.2 % (ref 11.5–15.5)
WBC: 10.8 10*3/uL — ABNORMAL HIGH (ref 4.0–10.5)

## 2014-06-12 LAB — PROTIME-INR
INR: 1.01 (ref 0.00–1.49)
Prothrombin Time: 13.4 seconds (ref 11.6–15.2)

## 2014-06-12 LAB — BASIC METABOLIC PANEL
Anion gap: 5 (ref 5–15)
BUN: 22 mg/dL (ref 6–23)
CO2: 26 mmol/L (ref 19–32)
Calcium: 9 mg/dL (ref 8.4–10.5)
Chloride: 110 mEq/L (ref 96–112)
Creatinine, Ser: 1.33 mg/dL — ABNORMAL HIGH (ref 0.50–1.10)
GFR calc Af Amer: 43 mL/min — ABNORMAL LOW (ref >90)
GFR calc non Af Amer: 37 mL/min — ABNORMAL LOW (ref >90)
Glucose, Bld: 115 mg/dL — ABNORMAL HIGH (ref 70–99)
Potassium: 3.5 mmol/L (ref 3.5–5.1)
Sodium: 141 mmol/L (ref 135–145)

## 2014-06-12 LAB — URINALYSIS, ROUTINE W REFLEX MICROSCOPIC
BILIRUBIN URINE: NEGATIVE
GLUCOSE, UA: NEGATIVE mg/dL
KETONES UR: NEGATIVE mg/dL
Nitrite: POSITIVE — AB
PROTEIN: NEGATIVE mg/dL
Specific Gravity, Urine: 1.017 (ref 1.005–1.030)
UROBILINOGEN UA: 0.2 mg/dL (ref 0.0–1.0)
pH: 6.5 (ref 5.0–8.0)

## 2014-06-12 SURGERY — FIXATION, FRACTURE, INTERTROCHANTERIC, WITH INTRAMEDULLARY ROD
Anesthesia: General | Laterality: Right

## 2014-06-12 MED ORDER — ONDANSETRON HCL 4 MG/2ML IJ SOLN
INTRAMUSCULAR | Status: AC
  Filled 2014-06-12: qty 2

## 2014-06-12 MED ORDER — HYDROCODONE-ACETAMINOPHEN 5-325 MG PO TABS: 1.0000 | ORAL_TABLET | ORAL | Status: DC | PRN

## 2014-06-12 MED ORDER — GLYCOPYRROLATE 0.2 MG/ML IJ SOLN
INTRAMUSCULAR | Status: AC
  Filled 2014-06-12: qty 3

## 2014-06-12 MED ORDER — NEOSTIGMINE METHYLSULFATE 10 MG/10ML IV SOLN
INTRAVENOUS | Status: DC | PRN
  Administered 2014-06-12: 4 mg via INTRAVENOUS

## 2014-06-12 MED ORDER — ONDANSETRON HCL 4 MG/2ML IJ SOLN: 4.0000 mg | Freq: Four times a day (QID) | INTRAMUSCULAR | Status: DC | PRN

## 2014-06-12 MED ORDER — CEFAZOLIN SODIUM-DEXTROSE 2-3 GM-% IV SOLR
2.0000 g | Freq: Four times a day (QID) | INTRAVENOUS | Status: AC
  Administered 2014-06-12 – 2014-06-13 (×2): 2 g via INTRAVENOUS
  Filled 2014-06-12 (×2): qty 50

## 2014-06-12 MED ORDER — METHOCARBAMOL 500 MG PO TABS
500.0000 mg | ORAL_TABLET | Freq: Four times a day (QID) | ORAL | Status: DC | PRN
  Administered 2014-06-14: 500 mg via ORAL
  Filled 2014-06-12: qty 1

## 2014-06-12 MED ORDER — LACTATED RINGERS IV SOLN
INTRAVENOUS | Status: DC | PRN
  Administered 2014-06-12: 17:00:00 via INTRAVENOUS

## 2014-06-12 MED ORDER — ROCURONIUM BROMIDE 100 MG/10ML IV SOLN
INTRAVENOUS | Status: DC | PRN
  Administered 2014-06-12: 25 mg via INTRAVENOUS

## 2014-06-12 MED ORDER — BUPIVACAINE-EPINEPHRINE 0.25% -1:200000 IJ SOLN
INTRAMUSCULAR | Status: DC | PRN
  Administered 2014-06-12: 20 mL

## 2014-06-12 MED ORDER — CEFAZOLIN SODIUM-DEXTROSE 2-3 GM-% IV SOLR
2.0000 g | INTRAVENOUS | Status: AC
  Administered 2014-06-12: 2 g via INTRAVENOUS
  Filled 2014-06-12: qty 50

## 2014-06-12 MED ORDER — ONDANSETRON HCL 4 MG/2ML IJ SOLN: 4.0000 mg | Freq: Three times a day (TID) | INTRAMUSCULAR | Status: DC | PRN

## 2014-06-12 MED ORDER — FENTANYL CITRATE 0.05 MG/ML IJ SOLN
INTRAMUSCULAR | Status: AC
  Filled 2014-06-12: qty 5

## 2014-06-12 MED ORDER — ARTIFICIAL TEARS OP OINT
TOPICAL_OINTMENT | OPHTHALMIC | Status: DC | PRN
  Administered 2014-06-12: 1 via OPHTHALMIC

## 2014-06-12 MED ORDER — SODIUM CHLORIDE 0.9 % IV SOLN
INTRAVENOUS | Status: DC
  Administered 2014-06-13: 05:00:00 via INTRAVENOUS

## 2014-06-12 MED ORDER — ONDANSETRON HCL 4 MG/2ML IJ SOLN
INTRAMUSCULAR | Status: DC | PRN
  Administered 2014-06-12: 4 mg via INTRAVENOUS

## 2014-06-12 MED ORDER — ADULT MULTIVITAMIN W/MINERALS CH
1.0000 | ORAL_TABLET | Freq: Every day | ORAL | Status: DC
  Administered 2014-06-13 – 2014-06-16 (×4): 1 via ORAL
  Filled 2014-06-12 (×4): qty 1

## 2014-06-12 MED ORDER — ONDANSETRON HCL 4 MG PO TABS: 4.0000 mg | ORAL_TABLET | Freq: Four times a day (QID) | ORAL | Status: DC | PRN

## 2014-06-12 MED ORDER — HYDROMORPHONE HCL 1 MG/ML IJ SOLN: 0.2500 mg | INTRAMUSCULAR | Status: DC | PRN

## 2014-06-12 MED ORDER — LIDOCAINE HCL (CARDIAC) 20 MG/ML IV SOLN
INTRAVENOUS | Status: DC | PRN
  Administered 2014-06-12: 100 mg via INTRAVENOUS

## 2014-06-12 MED ORDER — ACETAMINOPHEN 650 MG RE SUPP: 650.0000 mg | Freq: Four times a day (QID) | RECTAL | Status: DC | PRN

## 2014-06-12 MED ORDER — PROPOFOL 10 MG/ML IV BOLUS
INTRAVENOUS | Status: DC | PRN
Start: 2014-06-12 — End: 2014-06-12
  Administered 2014-06-12: 110 mg via INTRAVENOUS

## 2014-06-12 MED ORDER — SODIUM CHLORIDE 0.9 % IV SOLN
Freq: Once | INTRAVENOUS | Status: AC
  Administered 2014-06-12: 12:00:00 via INTRAVENOUS

## 2014-06-12 MED ORDER — MORPHINE SULFATE 4 MG/ML IJ SOLN
2.0000 mg | Freq: Once | INTRAMUSCULAR | Status: DC
  Filled 2014-06-12: qty 1

## 2014-06-12 MED ORDER — FERROUS SULFATE 325 (65 FE) MG PO TABS
325.0000 mg | ORAL_TABLET | Freq: Two times a day (BID) | ORAL | Status: DC
  Administered 2014-06-13 – 2014-06-16 (×7): 325 mg via ORAL
  Filled 2014-06-12 (×9): qty 1

## 2014-06-12 MED ORDER — SENNA 8.6 MG PO TABS
1.0000 | ORAL_TABLET | Freq: Every day | ORAL | Status: DC
  Administered 2014-06-13 – 2014-06-16 (×4): 8.6 mg via ORAL
  Filled 2014-06-12 (×4): qty 1

## 2014-06-12 MED ORDER — SUCCINYLCHOLINE CHLORIDE 20 MG/ML IJ SOLN
INTRAMUSCULAR | Status: DC | PRN
  Administered 2014-06-12: 70 mg via INTRAVENOUS

## 2014-06-12 MED ORDER — BUPIVACAINE-EPINEPHRINE (PF) 0.25% -1:200000 IJ SOLN
INTRAMUSCULAR | Status: AC
Start: 2014-06-12 — End: 2014-06-12
  Filled 2014-06-12: qty 30

## 2014-06-12 MED ORDER — SODIUM CHLORIDE 0.9 % IV BOLUS (SEPSIS)
1000.0000 mL | Freq: Once | INTRAVENOUS | Status: AC
  Administered 2014-06-12: 1000 mL via INTRAVENOUS

## 2014-06-12 MED ORDER — MORPHINE SULFATE 4 MG/ML IJ SOLN
4.0000 mg | Freq: Once | INTRAMUSCULAR | Status: AC
  Administered 2014-06-12: 4 mg via INTRAVENOUS
  Filled 2014-06-12: qty 1

## 2014-06-12 MED ORDER — ALUM & MAG HYDROXIDE-SIMETH 200-200-20 MG/5ML PO SUSP: 30.0000 mL | ORAL | Status: DC | PRN

## 2014-06-12 MED ORDER — ONDANSETRON HCL 4 MG/2ML IJ SOLN: 4.0000 mg | Freq: Once | INTRAMUSCULAR | Status: DC | PRN

## 2014-06-12 MED ORDER — MORPHINE SULFATE 2 MG/ML IJ SOLN: 0.5000 mg | INTRAMUSCULAR | Status: DC | PRN

## 2014-06-12 MED ORDER — PROMETHAZINE HCL 25 MG/ML IJ SOLN: 6.2500 mg | Freq: Four times a day (QID) | INTRAMUSCULAR | Status: DC | PRN

## 2014-06-12 MED ORDER — DOCUSATE SODIUM 100 MG PO CAPS
100.0000 mg | ORAL_CAPSULE | Freq: Two times a day (BID) | ORAL | Status: DC
  Administered 2014-06-12 – 2014-06-16 (×8): 100 mg via ORAL
  Filled 2014-06-12 (×9): qty 1

## 2014-06-12 MED ORDER — RIVAROXABAN 10 MG PO TABS
10.0000 mg | ORAL_TABLET | Freq: Every day | ORAL | Status: DC
  Administered 2014-06-13 – 2014-06-15 (×3): 10 mg via ORAL
  Filled 2014-06-12 (×3): qty 1

## 2014-06-12 MED ORDER — 0.9 % SODIUM CHLORIDE (POUR BTL) OPTIME
TOPICAL | Status: DC | PRN
  Administered 2014-06-12: 1000 mL

## 2014-06-12 MED ORDER — GLYCOPYRROLATE 0.2 MG/ML IJ SOLN
INTRAMUSCULAR | Status: DC | PRN
  Administered 2014-06-12: 0.6 mg via INTRAVENOUS

## 2014-06-12 MED ORDER — DEXTROSE 5 % IV SOLN
1.0000 g | Freq: Once | INTRAVENOUS | Status: AC
  Administered 2014-06-12: 1 g via INTRAVENOUS
  Filled 2014-06-12: qty 10

## 2014-06-12 MED ORDER — METHOCARBAMOL 1000 MG/10ML IJ SOLN
500.0000 mg | Freq: Four times a day (QID) | INTRAVENOUS | Status: DC | PRN
  Administered 2014-06-12: 500 mg via INTRAVENOUS
  Filled 2014-06-12 (×2): qty 5

## 2014-06-12 MED ORDER — ROSUVASTATIN CALCIUM 20 MG PO TABS
20.0000 mg | ORAL_TABLET | Freq: Every day | ORAL | Status: DC
  Administered 2014-06-13 – 2014-06-16 (×4): 20 mg via ORAL
  Filled 2014-06-12 (×4): qty 1

## 2014-06-12 MED ORDER — HYDROCODONE-ACETAMINOPHEN 5-325 MG PO TABS
1.0000 | ORAL_TABLET | Freq: Four times a day (QID) | ORAL | Status: DC | PRN
Start: 2014-06-12 — End: 2014-06-16
  Administered 2014-06-13 – 2014-06-14 (×2): 1 via ORAL
  Administered 2014-06-14 – 2014-06-15 (×2): 2 via ORAL
  Filled 2014-06-12 (×2): qty 2
  Filled 2014-06-12 (×2): qty 1

## 2014-06-12 MED ORDER — LIDOCAINE HCL (CARDIAC) 20 MG/ML IV SOLN
INTRAVENOUS | Status: AC
  Filled 2014-06-12: qty 5

## 2014-06-12 MED ORDER — MORPHINE SULFATE 2 MG/ML IJ SOLN: 2.0000 mg | INTRAMUSCULAR | Status: DC | PRN

## 2014-06-12 MED ORDER — HYDROMORPHONE HCL 1 MG/ML IJ SOLN
0.5000 mg | INTRAMUSCULAR | Status: DC | PRN
  Administered 2014-06-12: 0.5 mg via INTRAVENOUS

## 2014-06-12 MED ORDER — PHENYLEPHRINE HCL 10 MG/ML IJ SOLN
INTRAMUSCULAR | Status: DC | PRN
  Administered 2014-06-12 (×2): 80 ug via INTRAVENOUS

## 2014-06-12 MED ORDER — FENTANYL CITRATE 0.05 MG/ML IJ SOLN
INTRAMUSCULAR | Status: DC | PRN
  Administered 2014-06-12: 125 ug via INTRAVENOUS
  Administered 2014-06-12: 75 ug via INTRAVENOUS

## 2014-06-12 MED ORDER — HYDROMORPHONE HCL 1 MG/ML IJ SOLN
INTRAMUSCULAR | Status: AC
  Filled 2014-06-12: qty 1

## 2014-06-12 MED ORDER — DEXTROSE 5 % IV SOLN
10.0000 mg | INTRAVENOUS | Status: DC | PRN
  Administered 2014-06-12: 50 ug/min via INTRAVENOUS

## 2014-06-12 MED ORDER — ACETAMINOPHEN 325 MG PO TABS
650.0000 mg | ORAL_TABLET | Freq: Four times a day (QID) | ORAL | Status: DC | PRN
  Administered 2014-06-16: 650 mg via ORAL
  Filled 2014-06-12: qty 2

## 2014-06-12 MED ORDER — CHLORHEXIDINE GLUCONATE 4 % EX LIQD
60.0000 mL | Freq: Once | CUTANEOUS | Status: DC
  Filled 2014-06-12: qty 60

## 2014-06-12 MED ORDER — DEXTROSE-NACL 5-0.45 % IV SOLN: INTRAVENOUS | Status: DC

## 2014-06-12 MED ORDER — LACTATED RINGERS IV SOLN
INTRAVENOUS | Status: DC
  Administered 2014-06-12: 17:00:00 via INTRAVENOUS

## 2014-06-12 SURGICAL SUPPLY — 50 items
BIT DRILL 4.3MMS DISTAL GRDTED (BIT) IMPLANT
BLADE SURG ROTATE 9660 (MISCELLANEOUS) ×2 IMPLANT
COVER MAYO STAND STRL (DRAPES) ×1 IMPLANT
COVER PERINEAL POST (MISCELLANEOUS) ×2 IMPLANT
COVER SURGICAL LIGHT HANDLE (MISCELLANEOUS) ×4 IMPLANT
DECANTER SPIKE VIAL GLASS SM (MISCELLANEOUS) ×2 IMPLANT
DRAPE STERI IOBAN 125X83 (DRAPES) ×2 IMPLANT
DRILL 4.3MMS DISTAL GRADUATED (BIT) ×2
DRSG MEPILEX BORDER 4X4 (GAUZE/BANDAGES/DRESSINGS) ×2 IMPLANT
DRSG MEPILEX BORDER 4X8 (GAUZE/BANDAGES/DRESSINGS) ×2 IMPLANT
DURAPREP 26ML APPLICATOR (WOUND CARE) ×2 IMPLANT
ELECT CAUTERY BLADE 6.4 (BLADE) ×2 IMPLANT
ELECT REM PT RETURN 9FT ADLT (ELECTROSURGICAL) ×2
ELECTRODE REM PT RTRN 9FT ADLT (ELECTROSURGICAL) ×1 IMPLANT
EVACUATOR 1/8 PVC DRAIN (DRAIN) IMPLANT
GAUZE XEROFORM 5X9 LF (GAUZE/BANDAGES/DRESSINGS) ×2 IMPLANT
GLOVE BIOGEL PI IND STRL 8 (GLOVE) ×2 IMPLANT
GLOVE BIOGEL PI INDICATOR 8 (GLOVE) ×2
GLOVE BIOGEL PI ORTHO PRO SZ7 (GLOVE) ×3
GLOVE ECLIPSE 7.5 STRL STRAW (GLOVE) ×4 IMPLANT
GLOVE PI ORTHO PRO STRL SZ7 (GLOVE) IMPLANT
GLOVE SURG SS PI 6.5 STRL IVOR (GLOVE) ×1 IMPLANT
GLOVE SURG SS PI 7.0 STRL IVOR (GLOVE) ×3 IMPLANT
GOWN STRL REUS W/ TWL LRG LVL3 (GOWN DISPOSABLE) ×1 IMPLANT
GOWN STRL REUS W/ TWL XL LVL3 (GOWN DISPOSABLE) ×2 IMPLANT
GOWN STRL REUS W/TWL LRG LVL3 (GOWN DISPOSABLE) ×2
GOWN STRL REUS W/TWL XL LVL3 (GOWN DISPOSABLE) ×8
GUIDEPIN 3.2X17.5 THRD DISP (PIN) ×2 IMPLANT
GUIDEWIRE BALL NOSE 100CM (WIRE) ×1 IMPLANT
HFN RH 130 DEG 11MM X 360MM (Orthopedic Implant) ×1 IMPLANT
HIP FRAC NAIL LAG SCR 10.5X100 (Orthopedic Implant) ×1 IMPLANT
KIT BASIN OR (CUSTOM PROCEDURE TRAY) ×2 IMPLANT
KIT ROOM TURNOVER OR (KITS) ×2 IMPLANT
LINER BOOT UNIVERSAL DISP (MISCELLANEOUS) ×2 IMPLANT
MANIFOLD NEPTUNE II (INSTRUMENTS) ×2 IMPLANT
MARKER SKIN DUAL TIP RULER LAB (MISCELLANEOUS) ×1 IMPLANT
NS IRRIG 1000ML POUR BTL (IV SOLUTION) ×2 IMPLANT
PACK GENERAL/GYN (CUSTOM PROCEDURE TRAY) ×2 IMPLANT
PAD ARMBOARD 7.5X6 YLW CONV (MISCELLANEOUS) ×6 IMPLANT
SCREW ANTI ROTATION 80MM (Screw) ×1 IMPLANT
SCREW CANN THRD AFF 10.5X100 (Orthopedic Implant) IMPLANT
SCREW DRILL BIT ANIT ROTATION (BIT) ×1 IMPLANT
SCREWDRIVER HEX TIP 3.5MM (MISCELLANEOUS) ×1 IMPLANT
STAPLER VISISTAT 35W (STAPLE) ×2 IMPLANT
SUT VIC AB 0 CTB1 27 (SUTURE) ×4 IMPLANT
SUT VIC AB 1 CTB1 27 (SUTURE) ×2 IMPLANT
SUT VIC AB 2-0 CTB1 (SUTURE) ×2 IMPLANT
TOWEL OR 17X24 6PK STRL BLUE (TOWEL DISPOSABLE) ×2 IMPLANT
TOWEL OR 17X26 10 PK STRL BLUE (TOWEL DISPOSABLE) ×2 IMPLANT
WATER STERILE IRR 1000ML POUR (IV SOLUTION) ×2 IMPLANT

## 2014-06-12 NOTE — ED Provider Notes (Signed)
Patient was doing well today. She was standing for an x-ray at routine visit at her orthopedist office when she fell injuring her right hip. She was sent here via EMS. Her pain is presently well controlled. On exam alert Glasgow Coma Score 15. Right lower extremity shortened and externally rotated. DP pulse 2+  Orlie Dakin, MD 06/12/14 1303

## 2014-06-12 NOTE — Consult Note (Signed)
Reason for Consult:79 yo female who fell earlier today and has intertroch fx Referring Physician: hospitalists  CHAUNCEY BRUNO is an 80 y.o. female.  HPI: 80 yo female who fell in our office earlier today and was found to have intertroch fx.  I am consulted to manage fractuer.  Pt evaluated and cleared by int medicine and after discussion with husband and patient we feel that risk of surgery though significant is better than waiting for lesser effects of plavix.     Past Medical History  Diagnosis Date  . High cholesterol   . Hearing impaired person   . Hypertension   . Stroke     speech slightly , right sided weakness  . Kidney stones   . Degenerative joint disease of knee, right     right knee  . History of blood transfusion     Past Surgical History  Procedure Laterality Date  . Kidney stones    . Lithotripsy    . Peg placement      and removal  . Peg tube removal    . Tympanoplasty    . Tonsillectomy    . Appendectomy    . Radiology with anesthesia  04/22/2012    Procedure: RADIOLOGY WITH ANESTHESIA;  Surgeon: Rob Hickman, MD;  Location: [facility];  Service: Radiology;  Laterality: N/A;  VERTEBRAL ARTERY STENT PLACEMENT AND CEREBRAL ANGIOGRAM    History reviewed. No pertinent family history.  Social History:  reports that she has quit smoking. She does not have any smokeless tobacco history on file. She reports that she drinks alcohol. She reports that she does not use illicit drugs.  Allergies: No Known Allergies  Medications: I have reviewed the patient's current medications.  Results for orders placed or performed during the hospital encounter of 06/12/14 (from the past 48 hour(s))  Basic metabolic panel     Status: Abnormal   Collection Time: 06/12/14 10:51 AM  Result Value Ref Range   Sodium 141 135 - 145 mmol/L    Comment: Please note change in reference range.   Potassium 3.5 3.5 - 5.1 mmol/L    Comment: Please note change in reference range.    Chloride 110 96 - 112 mEq/L   CO2 26 19 - 32 mmol/L   Glucose, Bld 115 (H) 70 - 99 mg/dL   BUN 22 6 - 23 mg/dL   Creatinine, Ser 1.33 (H) 0.50 - 1.10 mg/dL   Calcium 9.0 8.4 - 10.5 mg/dL   GFR calc non Af Amer 37 (L) >90 mL/min   GFR calc Af Amer 43 (L) >90 mL/min    Comment: (NOTE) The eGFR has been calculated using the CKD EPI equation. This calculation has not been validated in all clinical situations. eGFR's persistently <90 mL/min signify possible Chronic Kidney Disease.    Anion gap 5 5 - 15  CBC WITH DIFFERENTIAL     Status: Abnormal   Collection Time: 06/12/14 10:51 AM  Result Value Ref Range   WBC 10.8 (H) 4.0 - 10.5 K/uL   RBC 4.80 3.87 - 5.11 MIL/uL   Hemoglobin 12.3 12.0 - 15.0 g/dL   HCT 39.2 36.0 - 46.0 %   MCV 81.7 78.0 - 100.0 fL   MCH 25.6 (L) 26.0 - 34.0 pg   MCHC 31.4 30.0 - 36.0 g/dL   RDW 14.2 11.5 - 15.5 %   Platelets 309 150 - 400 K/uL   Neutrophils Relative % 78 (H) 43 - 77 %   Neutro  Abs 8.4 (H) 1.7 - 7.7 K/uL   Lymphocytes Relative 11 (L) 12 - 46 %   Lymphs Abs 1.2 0.7 - 4.0 K/uL   Monocytes Relative 7 3 - 12 %   Monocytes Absolute 0.8 0.1 - 1.0 K/uL   Eosinophils Relative 3 0 - 5 %   Eosinophils Absolute 0.3 0.0 - 0.7 K/uL   Basophils Relative 1 0 - 1 %   Basophils Absolute 0.1 0.0 - 0.1 K/uL  Protime-INR     Status: None   Collection Time: 06/12/14 10:51 AM  Result Value Ref Range   Prothrombin Time 13.4 11.6 - 15.2 seconds   INR 1.01 0.00 - 1.49  Type and screen     Status: None   Collection Time: 06/12/14 10:55 AM  Result Value Ref Range   ABO/RH(D) O POS    Antibody Screen NEG    Sample Expiration 06/15/2014   Urinalysis, Routine w reflex microscopic     Status: Abnormal   Collection Time: 06/12/14 11:37 AM  Result Value Ref Range   Color, Urine YELLOW YELLOW   APPearance CLOUDY (A) CLEAR   Specific Gravity, Urine 1.017 1.005 - 1.030   pH 6.5 5.0 - 8.0   Glucose, UA NEGATIVE NEGATIVE mg/dL   Hgb urine dipstick MODERATE (A)  NEGATIVE   Bilirubin Urine NEGATIVE NEGATIVE   Ketones, ur NEGATIVE NEGATIVE mg/dL   Protein, ur NEGATIVE NEGATIVE mg/dL   Urobilinogen, UA 0.2 0.0 - 1.0 mg/dL   Nitrite POSITIVE (A) NEGATIVE   Leukocytes, UA MODERATE (A) NEGATIVE  Urine microscopic-add on     Status: Abnormal   Collection Time: 06/12/14 11:37 AM  Result Value Ref Range   Squamous Epithelial / LPF RARE RARE   WBC, UA TOO NUMEROUS TO COUNT <3 WBC/hpf   RBC / HPF 11-20 <3 RBC/hpf   Bacteria, UA MANY (A) RARE    Dg Chest 1 View  06/12/2014   CLINICAL DATA:  [address], hip fracture  EXAM: CHEST - 1 VIEW  COMPARISON:  05/13/2011  FINDINGS: Moderate cardiomegaly. Mild bilateral interstitial edema or infiltrates, slightly increased since prior study. Atheromatous ectatic aorta. No effusion.  No pneumothorax. Mild spondylitic changes in the lower thoracic spine.  IMPRESSION: 1. Worsening cardiomegaly and bilateral interstitial edema/infiltrates.   Electronically Signed   By: Arne Cleveland M.D.   On: 06/12/2014 13:33   Dg Hip Unilat With Pelvis 2-3 Views Right  06/12/2014   CLINICAL DATA:  Status post fall  EXAM: DG HIP W/ PELVIS 2-3V*R*  COMPARISON:  None.  FINDINGS: There is a comminuted, displaced and mildly angulated right intertrochanteric fracture. There is no hip dislocation. There is a bone island in the left femoral neck. There is generalized osteopenia.  IMPRESSION: Comminuted right intertrochanteric fracture without dislocation.   Electronically Signed   By: Kathreen Devoid   On: 06/12/2014 13:40    ROS  ROS: I have reviewed the patient's review of systems thoroughly and there are no positive responses as relates to the HPI. EXAM: Blood pressure 132/53, pulse 75, temperature 98.7 F (37.1 C), temperature source Oral, resp. rate 11, SpO2 100 %.  Well-developed well-nourished patient in no acute distress. Alert and oriented x3 HEENT:within normal limits Cardiac: Regular rate and rhythm Pulmonary: Lungs clear to  auscultation Abdomen: Soft and nontender.  Normal active bowel sounds  Musculoskeletal: (r leg: ext rot and shortened// nvi distally  Physical Exam  Assessment/Plan: 80 yo female on plavix who fell earlier today and suffered a intertroch fx  r side.//  I have had conversation with fam and patient and we all feel better to proceed with surgery than to wait. They understand risks of surgery include but are not limited to bleeding, infection, failure to heel, need for further surgery, and therre is a slight risk of death in and around the time of surgery.  Understanding this we have elected to proceed with surgery.  Aditya Nastasi L 06/12/2014, 4:46 PM

## 2014-06-12 NOTE — Anesthesia Postprocedure Evaluation (Signed)
  Anesthesia Post-op Note  Patient: [name]  Procedure(s) Performed: Procedure(s): INTRAMEDULLARY (IM) NAIL INTERTROCHANTRIC RIGHT HIP (Right)  Patient Location: PACU  Anesthesia Type: General   Level of Consciousness: awake, alert  and oriented  Airway and Oxygen Therapy: Patient Spontanous Breathing  Post-op Pain: mild  Post-op Assessment: Post-op Vital signs reviewed  Post-op Vital Signs: Reviewed  Last Vitals:  Filed Vitals:   06/12/14 2110  BP: 85/43  Pulse: 51  Temp: 36.6 C  Resp: 16    Complications: No apparent anesthesia complications

## 2014-06-12 NOTE — H&P (Signed)
History and Physical  [name] [id number] DOB: Mar 07, 1935 DOA: 06/12/2014  Referring physician: ED PCP: Alonza Bogus, MD   Chief Complaint: hip fracture  HPI: [name] is a 80 y.o. female female with htn/hld, severe rt carotid stenosis and CVA with residual slurred speech, was sent from [facility] orthopedics to ED due to hip fracture. Patient was at [facility] this morning having x-rays of her right knee performed when she fell while attempting to get on the x-ray table. No head injury or LOC. Patient was assisted back to x-ray table and had x-ray performed of right hip which showed hip fracture. Patient thus sent to the ED for further evaluation. patient is hard of hearing and has slurred speech, but able to provide much history. Husband states she is at her baseline mental status.  Initial work up in the Ed showed she also have uti, she received a dose of rocephin.  She is on plavix, no other anti-coagulation.    Review of Systems:    Review of systems is limited due to poor historian, currently denies pain, no cough, no sob, no edema. Husband reported patient does not have prior fall history.  Past Medical History  Diagnosis Date  . High cholesterol   . Hearing impaired person   . Hypertension   . Stroke     speech slightly , right sided weakness  . Kidney stones   . Degenerative joint disease of knee, right     right knee  . History of blood transfusion    Past Surgical History  Procedure Laterality Date  . Kidney stones    . Lithotripsy    . Peg placement      and removal  . Peg tube removal    . Tympanoplasty    . Tonsillectomy    . Appendectomy    . Radiology with anesthesia  04/22/2012    Procedure: RADIOLOGY WITH ANESTHESIA;  Surgeon: Rob Hickman, MD;  Location: [facility];  Service: Radiology;  Laterality: N/A;  VERTEBRAL ARTERY STENT PLACEMENT AND CEREBRAL ANGIOGRAM   Social History:  reports that she has quit smoking.  She does not have any smokeless tobacco history on file. She reports that she drinks alcohol. She reports that she does not use illicit drugs. Patient lives at home. Husband is the main care giver. She is able to participate in activities of daily living prior to the fall. 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  No Known Allergies  History reviewed. No pertinent family history.    Prior to Admission medications   Medication Sig Start Date End Date Taking? Authorizing Provider  clopidogrel (PLAVIX) 75 MG tablet Take 75 mg by mouth daily.      Historical Provider, MD  lisinopril-hydrochlorothiazide (PRINZIDE,ZESTORETIC) 20-12.5 MG per tablet Take 1 tablet by mouth daily.      Historical Provider, MD  Multiple Vitamins-Minerals (CENTRUM ULTRA WOMENS) TABS Take 1 tablet by mouth daily.      Historical Provider, MD  naproxen sodium (ANAPROX) 220 MG tablet Take 220 mg by mouth daily as needed. For pain    Historical Provider, MD  rosuvastatin (CRESTOR) 20 MG tablet Take 20 mg by mouth daily.    Historical Provider, MD  senna (SENOKOT) 8.6 MG tablet Take 1 tablet by mouth daily.      Historical Provider, MD    Physical Exam: BP 132/53 mmHg  Pulse 75  Temp(Src) 98.7 F (37.1 C) (Oral)  Resp 11  SpO2 100%  General:  NAD, seems confused, but husband able to get patient answer most questions appropriately. Husband states patient mental status is at baseline. Eyes: PERRL ENT: unremarkable Neck: suppler Cardiovascular: RRR Respiratory: CTABL Abdomen: soft, NT/ND/positive BS. Skin: few ecchymosis Musculoskeletal: right leg shortened and externally rotated Psychiatric: calm Neurologic: per husband, mental status at baseline.          Labs on Admission:  Basic Metabolic Panel:  Recent Labs Lab 06/12/14 1051  NA 141  K 3.5  CL 110  CO2 26  GLUCOSE 115*  BUN 22  CREATININE 1.33*  CALCIUM 9.0   Liver Function Tests: No results for input(s): AST, ALT, ALKPHOS, BILITOT,  PROT, ALBUMIN in the last 168 hours. No results for input(s): LIPASE, AMYLASE in the last 168 hours. No results for input(s): AMMONIA in the last 168 hours. CBC:  Recent Labs Lab 06/12/14 1051  WBC 10.8*  NEUTROABS 8.4*  HGB 12.3  HCT 39.2  MCV 81.7  PLT 309   Cardiac Enzymes: No results for input(s): CKTOTAL, CKMB, CKMBINDEX, TROPONINI in the last 168 hours.  BNP (last 3 results) No results for input(s): PROBNP in the last 8760 hours. CBG: No results for input(s): GLUCAP in the last 168 hours.  Radiological Exams on Admission: Dg Chest 1 View  06/12/2014   CLINICAL DATA:  [address], hip fracture  EXAM: CHEST - 1 VIEW  COMPARISON:  05/13/2011  FINDINGS: Moderate cardiomegaly. Mild bilateral interstitial edema or infiltrates, slightly increased since prior study. Atheromatous ectatic aorta. No effusion.  No pneumothorax. Mild spondylitic changes in the lower thoracic spine.  IMPRESSION: 1. Worsening cardiomegaly and bilateral interstitial edema/infiltrates.   Electronically Signed   By: Arne Cleveland M.D.   On: 06/12/2014 13:33   Dg Hip Unilat With Pelvis 2-3 Views Right  06/12/2014   CLINICAL DATA:  Status post fall  EXAM: DG HIP W/ PELVIS 2-3V*R*  COMPARISON:  None.  FINDINGS: There is a comminuted, displaced and mildly angulated right intertrochanteric fracture. There is no hip dislocation. There is a bone island in the left femoral neck. There is generalized osteopenia.  IMPRESSION: Comminuted right intertrochanteric fracture without dislocation.   Electronically Signed   By: Kathreen Devoid   On: 06/12/2014 13:40    EKG: pending  Assessment/Plan 1. Right intertochanteric fracture, displaced. Patient has intermediate risk for intermediate risk surgery.family understand the risk of bleeding since patient has been on plavix. Family agree with surgery. OR this evening. Post op DVT prophylaxis and plavix use need to be discussed with neurology. Patient has significant carotid artery  stenosis. Consider neurology consult. All oral meds has been on hold on admission, restart home meds when appropriate.  2. UTI: culture pending , empirically on rocephin. gentle hydration.  3. H/o carotid artery stenosis, CVA with residual deficit: on plavix, consider neurology consult for post op meds assistance.  Present on Admission:  . Closed right hip fracture . Hip fracture  Consultants: orthopedics  Code Status: full  Family Communication: husband  Disposition Plan: admit to 5N with tele  Time spent: >15mins  Ayub Kirsh [facility] Pager [phone]

## 2014-06-12 NOTE — Transfer of Care (Signed)
Immediate Anesthesia Transfer of Care Note  Patient: [name]  Procedure(s) Performed: Procedure(s): INTRAMEDULLARY (IM) NAIL INTERTROCHANTRIC RIGHT HIP (Right)  Patient Location: PACU  Anesthesia Type:General  Level of Consciousness: awake, patient cooperative and responds to stimulation  Airway & Oxygen Therapy: Patient Spontanous Breathing and Patient connected to nasal cannula oxygen  Post-op Assessment: Report given to PACU RN and Post -op Vital signs reviewed and stable  Post vital signs: Reviewed and stable  Complications: No apparent anesthesia complications

## 2014-06-12 NOTE — Anesthesia Procedure Notes (Signed)
Procedure Name: Intubation Date/Time: 06/12/2014 5:45 PM Performed by: Susa Loffler Pre-anesthesia Checklist: Patient identified, Timeout performed, Emergency Drugs available, Suction available and Patient being monitored Patient Re-evaluated:Patient Re-evaluated prior to inductionOxygen Delivery Method: Circle system utilized Preoxygenation: Pre-oxygenation with 100% oxygen Intubation Type: IV induction Ventilation: Mask ventilation without difficulty Laryngoscope Size: Mac and 3 Grade View: Grade I Tube type: Oral Tube size: 7.5 mm Number of attempts: 1 Airway Equipment and Method: Stylet Placement Confirmation: ETT inserted through vocal cords under direct vision,  positive ETCO2 and breath sounds checked- equal and bilateral Secured at: 21 cm Tube secured with: Tape Dental Injury: Teeth and Oropharynx as per pre-operative assessment

## 2014-06-12 NOTE — Progress Notes (Signed)
Orthopedic surgeon Dr Berenice Primas called and states that per neurologist recommendation, hold plavix while patient on xarelto prophylaxis post op. Dr, Berenice Primas recommended start xatelto at prophylaxis dose 12-18hrs post op.

## 2014-06-12 NOTE — ED Provider Notes (Signed)
CSN: [id number]     Arrival date & time 06/12/14  1035 History   First MD Initiated Contact with Patient 06/12/14 1039     Chief Complaint  Patient presents with  . Hip Injury     (Consider location/radiation/quality/duration/timing/severity/associated sxs/prior Treatment) The history is provided by the patient and medical records.    This is a 80 year old female with past medical history significant for hypertension, hypercholesterolemia, stroke with residual right-sided weakness, presenting to the ED for right hip injury. Patient was at [facility] this morning having x-rays of her right knee performed when she fell while attempting to get on the x-ray table.  No head injury or LOC.  Patient was assisted back to x-ray table and had x-ray performed of right hip which showed hip fracture.  Patient thus sent to the ED for further evaluation.  Husband states she is at her baseline mental status.  She is on plavix, no other anti-coagulation.  No prior right hip injuries/surgeries.  Patient's primary orthopedist is Dr. Rhona Raider.  Last PO intake 0800 this morning. VS stable on arrival.  Past Medical History  Diagnosis Date  . High cholesterol   . Hearing impaired person   . Hypertension   . Stroke     speech slightly , right sided weakness  . Kidney stones   . Degenerative joint disease of knee, right     right knee  . History of blood transfusion    Past Surgical History  Procedure Laterality Date  . Kidney stones    . Lithotripsy    . Peg placement      and removal  . Peg tube removal    . Tympanoplasty    . Tonsillectomy    . Appendectomy    . Radiology with anesthesia  04/22/2012    Procedure: RADIOLOGY WITH ANESTHESIA;  Surgeon: Rob Hickman, MD;  Location: [place];  Service: Radiology;  Laterality: N/A;  VERTEBRAL ARTERY STENT PLACEMENT AND CEREBRAL ANGIOGRAM   History reviewed. No pertinent family history. History  Substance Use Topics  . Smoking status:  Former Research scientist (life sciences)  . Smokeless tobacco: Not on file  . Alcohol Use: Yes     Comment: occasional wine   OB History    No data available     Review of Systems  Musculoskeletal: Positive for arthralgias.  All other systems reviewed and are negative.     Allergies  Review of patient's allergies indicates no known allergies.  Home Medications   Prior to Admission medications   Medication Sig Start Date End Date Taking? Authorizing Provider  clopidogrel (PLAVIX) 75 MG tablet Take 75 mg by mouth daily.      Historical Provider, MD  lisinopril-hydrochlorothiazide (PRINZIDE,ZESTORETIC) 20-12.5 MG per tablet Take 1 tablet by mouth daily.      Historical Provider, MD  Multiple Vitamins-Minerals (CENTRUM ULTRA WOMENS) TABS Take 1 tablet by mouth daily.      Historical Provider, MD  naproxen sodium (ANAPROX) 220 MG tablet Take 220 mg by mouth daily as needed. For pain    Historical Provider, MD  rosuvastatin (CRESTOR) 20 MG tablet Take 20 mg by mouth daily.    Historical Provider, MD  senna (SENOKOT) 8.6 MG tablet Take 1 tablet by mouth daily.      Historical Provider, MD   BP 145/87 mmHg  Pulse 85  Temp(Src) 98.7 F (37.1 C) (Oral)  Resp 20  SpO2 97%   Physical Exam  Constitutional: She appears well-developed and well-nourished.  HENT:  Head: Normocephalic and atraumatic.  Mouth/Throat: Oropharynx is clear and moist.  No visible signs of head trauma  Eyes: Conjunctivae and EOM are normal. Pupils are equal, round, and reactive to light.  Neck: Normal range of motion.  Cardiovascular: Normal rate, regular rhythm and normal heart sounds.   Pulmonary/Chest: Effort normal and breath sounds normal.  Abdominal: Soft. Bowel sounds are normal.  Musculoskeletal:       Right hip: She exhibits decreased range of motion, tenderness and bony tenderness.   right hip externally rotated and shortened; hip locally tender; leg remains NVI  Neurological: She is alert.  AAO to baseline per husband   Skin: Skin is warm and dry.  Psychiatric: She has a normal mood and affect.  Nursing note and vitals reviewed.   ED Course  Procedures (including critical care time) Labs Review Labs Reviewed  BASIC METABOLIC PANEL - Abnormal; Notable for the following:    Glucose, Bld 115 (*)    Creatinine, Ser 1.33 (*)    GFR calc non Af Amer 37 (*)    GFR calc Af Amer 43 (*)    All other components within normal limits  CBC WITH DIFFERENTIAL - Abnormal; Notable for the following:    WBC 10.8 (*)    MCH 25.6 (*)    Neutrophils Relative % 78 (*)    Neutro Abs 8.4 (*)    Lymphocytes Relative 11 (*)    All other components within normal limits  URINALYSIS, ROUTINE W REFLEX MICROSCOPIC - Abnormal; Notable for the following:    APPearance CLOUDY (*)    Hgb urine dipstick MODERATE (*)    Nitrite POSITIVE (*)    Leukocytes, UA MODERATE (*)    All other components within normal limits  URINE MICROSCOPIC-ADD ON - Abnormal; Notable for the following:    Bacteria, UA MANY (*)    All other components within normal limits  URINE CULTURE  PROTIME-INR  TYPE AND SCREEN    Imaging Review Dg Chest 1 View  06/12/2014   CLINICAL DATA:  [place], hip fracture  EXAM: CHEST - 1 VIEW  COMPARISON:  05/13/2011  FINDINGS: Moderate cardiomegaly. Mild bilateral interstitial edema or infiltrates, slightly increased since prior study. Atheromatous ectatic aorta. No effusion.  No pneumothorax. Mild spondylitic changes in the lower thoracic spine.  IMPRESSION: 1. Worsening cardiomegaly and bilateral interstitial edema/infiltrates.   Electronically Signed   By: Arne Cleveland M.D.   On: 06/12/2014 13:33   Dg Hip Unilat With Pelvis 2-3 Views Right  06/12/2014   CLINICAL DATA:  Status post fall  EXAM: DG HIP W/ PELVIS 2-3V*R*  COMPARISON:  None.  FINDINGS: There is a comminuted, displaced and mildly angulated right intertrochanteric fracture. There is no hip dislocation. There is a bone island in the left femoral neck. There is  generalized osteopenia.  IMPRESSION: Comminuted right intertrochanteric fracture without dislocation.   Electronically Signed   By: Kathreen Devoid   On: 06/12/2014 13:40     EKG Interpretation None      MDM   Final diagnoses:  Fall  Closed right hip fracture, initial encounter  UTI (lower urinary tract infection)   80 year old female with fall at orthopedic office. X-ray was performed revealing hip fracture, sent here for further evaluation. There was no head trauma or loss of consciousness. Patient is alert and oriented to her baseline per husband at bedside. Lab work reassuring.  Chest x-ray with questionable edema versus infiltrate-- patient has no current cough, fever, no significant leukocytosis.  Patient also with nitrite + UTI.  Patient started on rocephin pending urine culture.  Case discussed with orthopedics, will plan for ORIF tonight.  Patient will be kept NPO, admitted to medicine for further management.  Larene Pickett, PA-C 06/12/14 [place], MD 06/12/14 [phone]

## 2014-06-12 NOTE — ED Notes (Addendum)
At [facility] for possible rt. Knee; hx. Of stroke (rt. Side weakness); while standing to get on x-ray table, pt. [place]; they placed her back on table and did an x-ray and obvious rt. Hip fx. VSS: 178/100, cbg 109, hr 78, sao2 94, rr. 4 12-16. Pt. Hoh.

## 2014-06-12 NOTE — Anesthesia Preprocedure Evaluation (Addendum)
Anesthesia Evaluation  Patient identified by MRN, date of birth, ID band Patient awake    Reviewed: Allergy & Precautions, H&P , NPO status , Patient's Chart, lab work & pertinent test results  Airway        Dental   Pulmonary neg pulmonary ROS, former smoker,  breath sounds clear to auscultation        Cardiovascular hypertension, Pt. on medications     Neuro/Psych CVA, Residual Symptoms    GI/Hepatic negative GI ROS, Neg liver ROS,   Endo/Other  negative endocrine ROS  Renal/GU Renal disease     Musculoskeletal  (+) Arthritis -,   Abdominal   Peds  Hematology negative hematology ROS (+)   Anesthesia Other Findings Fx Hip  Reproductive/Obstetrics                           Anesthesia Physical  Anesthesia Plan  ASA: III  Anesthesia Plan: General   Post-op Pain Management:    Induction: Intravenous  Airway Management Planned: Oral ETT  Additional Equipment:   Intra-op Plan:   Post-operative Plan: Extubation in OR  Informed Consent: I have reviewed the patients History and Physical, chart, labs and discussed the procedure including the risks, benefits and alternatives for the proposed anesthesia with the patient or authorized representative who has indicated his/her understanding and acceptance.     Plan Discussed with: CRNA, Anesthesiologist and Surgeon  Anesthesia Plan Comments:         Anesthesia Quick Evaluation

## 2014-06-12 NOTE — Brief Op Note (Signed)
06/12/2014  7:30 PM  PATIENT:  [name]  80 y.o. female  PRE-OPERATIVE DIAGNOSIS:  right intertroch hip fx  POST-OPERATIVE DIAGNOSIS:  right intertroch hip fx  PROCEDURE:  Procedure(s): INTRAMEDULLARY (IM) NAIL INTERTROCHANTRIC RIGHT HIP (Right)  SURGEON:  Surgeon(s) and Role:    * Alta Corning, MD - Primary  PHYSICIAN ASSISTANT:   ASSISTANTS: bethune   ANESTHESIA:   general  EBL:  Total I/O In: -  Out: 75 [Urine:75]  BLOOD ADMINISTERED:none  DRAINS: none   LOCAL MEDICATIONS USED:  MARCAINE     SPECIMEN:  No Specimen  DISPOSITION OF SPECIMEN:  N/A  COUNTS:  YES  TOURNIQUET:  * No tourniquets in log *  DICTATION: .Other Dictation: Dictation Number [id number]  PLAN OF CARE: Admit to inpatient   PATIENT DISPOSITION:  PACU - hemodynamically stable.   Delay start of Pharmacological VTE agent (>24hrs) due to surgical blood loss or risk of bleeding: no

## 2014-06-13 DIAGNOSIS — N390 Urinary tract infection, site not specified: Secondary | ICD-10-CM

## 2014-06-13 DIAGNOSIS — S72001A Fracture of unspecified part of neck of right femur, initial encounter for closed fracture: Secondary | ICD-10-CM

## 2014-06-13 DIAGNOSIS — R4701 Aphasia: Secondary | ICD-10-CM

## 2014-06-13 DIAGNOSIS — W19XXXA Unspecified fall, initial encounter: Secondary | ICD-10-CM

## 2014-06-13 LAB — CBC
HCT: 27.9 % — ABNORMAL LOW (ref 36.0–46.0)
Hemoglobin: 8.8 g/dL — ABNORMAL LOW (ref 12.0–15.0)
MCH: 26.5 pg (ref 26.0–34.0)
MCHC: 31.5 g/dL (ref 30.0–36.0)
MCV: 84 fL (ref 78.0–100.0)
Platelets: 288 10*3/uL (ref 150–400)
RBC: 3.32 MIL/uL — AB (ref 3.87–5.11)
RDW: 14.5 % (ref 11.5–15.5)
WBC: 11.1 10*3/uL — AB (ref 4.0–10.5)

## 2014-06-13 LAB — BASIC METABOLIC PANEL
ANION GAP: 7 (ref 5–15)
BUN: 23 mg/dL (ref 6–23)
CO2: 28 mmol/L (ref 19–32)
CREATININE: 1.57 mg/dL — AB (ref 0.50–1.10)
Calcium: 7.8 mg/dL — ABNORMAL LOW (ref 8.4–10.5)
Chloride: 106 mEq/L (ref 96–112)
GFR, EST AFRICAN AMERICAN: 36 mL/min — AB (ref >90)
GFR, EST NON AFRICAN AMERICAN: 31 mL/min — AB (ref >90)
GLUCOSE: 124 mg/dL — AB (ref 70–99)
POTASSIUM: 4.2 mmol/L (ref 3.5–5.1)
Sodium: 141 mmol/L (ref 135–145)

## 2014-06-13 MED ORDER — CEFTRIAXONE SODIUM IN DEXTROSE 20 MG/ML IV SOLN
1.0000 g | INTRAVENOUS | Status: DC
  Administered 2014-06-13: 1 g via INTRAVENOUS
  Filled 2014-06-13 (×2): qty 50

## 2014-06-13 NOTE — Clinical Social Work Psychosocial (Signed)
Clinical Social Work Department BRIEF PSYCHOSOCIAL ASSESSMENT 06/13/2014  Patient:  [name], [name]     Account Number:  [id number]     Admit date:  06/12/2014  Clinical Social Worker:  Hubert Azure  Date/Time:  06/13/2014 03:08 PM  Referred by:  Physician  Date Referred:  06/13/2014 Referred for  SNF Placement   Other Referral:   Interview type:  Other - See comment Other interview type:   Patient's husband [name] was present at bedside.    PSYCHOSOCIAL DATA Living Status:  HUSBAND Admitted from facility:   Level of care:   Primary support name:  [name] ([phone]) Primary support relationship to patient:  SPOUSE Degree of support available:   Good    CURRENT CONCERNS Current Concerns  Post-Acute Placement   Other Concerns:    SOCIAL WORK ASSESSMENT / PLAN CSW met with patient's husband [name]) who was present at bedside. CSW introduced self and explained role. CSW explained SNF placement process and discussed d/c plan with [name]. Per [name], patient suffered a major stroke 5 years ago and he became the caregiver. [name] states patient underwent therapy with CIR for approximately 5 weeks and then outpatient therapy at [facility].  Per [name], patient is able to independently care for herself, although he cooks and cleans. [name] states patient has shower rails and the home has been modified. Per [name], patient has been enjoying an active life. [name] states patient has recovered "remarkably" well from the stroke, with only speech problems until her recent fall. [name] prefers CIR as patient has been there before and staff are familiar with her and her condition, but is agreeable to SNF. [name] lists [facility] and [facility] as preference.   Assessment/plan status:  Other - See comment Other assessment/ plan:   CSW to submit PASARR and update FL2 for SNF placement.   Information/referral to community resources:   Husband provided with community  SNF list.    PATIENT'S/FAMILY'S RESPONSE TO PLAN OF CARE: Patient's husband was pleasant and very optimistic about patient's ability to recover in CIR. [name] credits [facility] for patient's "phenomenal" recovery, but is also agreeable to SNF as a back-up plan if PT recommends CIR.   [place], [place] Weekend Clinical Social Worker [phone]

## 2014-06-13 NOTE — Clinical Social Work Placement (Addendum)
Clinical Social Work Department CLINICAL SOCIAL WORK PLACEMENT NOTE 06/13/2014  Patient:  [name], [name]  Account Number:  [id number] Admit date:  06/12/2014  Clinical Social Worker:  Carrington Clamp, LCSWA  Date/time:  06/13/2014 08:28 PM  Clinical Social Work is seeking post-discharge placement for this patient at the following level of care:   [place]   (*CSW will update this form in Epic as items are completed)   06/13/2014  Patient/family provided with [facility] Department of Clinical Social Work's list of facilities offering this level of care within the geographic area requested by the patient (or if unable, by the patient's family).  06/13/2014  Patient/family informed of their freedom to choose among providers that offer the needed level of care, that participate in Medicare, Medicaid or managed care program needed by the patient, have an available bed and are willing to accept the patient.  06/13/2014  Patient/family informed of [facility]' ownership interest in [facility], as well as of the fact that they are under no obligation to receive care at this facility.  PASARR submitted to EDS on 06/13/2014 PASARR number received on 06/13/2014  FL2 transmitted to all facilities in geographic area requested by pt/family on  06/13/2014 FL2 transmitted to all facilities within larger geographic area on   Patient informed that his/her managed care company has contracts with or will negotiate with  certain facilities, including the following:     Patient/family informed of bed offers received:  06/16/2014 Lubertha Sayres, Latanya Presser) Patient chooses bed at [facility]) Physician recommends and patient chooses bed at    Patient to be transferred to  [facility] on  06/16/2014 Lubertha Sayres, [facility]) Patient to be transferred to facility by  [facility] Lubertha Sayres, [place]) Patient and family notified of  transfer on 06/16/2014 Lubertha Sayres, Latanya Presser) Name of family member notified: Pt's husband, Jakiyla Brennick, updated regarding discharge. [facility])  The following physician request were entered in Epic:   Additional Comments:  Carrington Clamp, [facility] Weekend Clinical Social Worker [phone]  Lubertha Sayres, [place] ([phone]) Licensed Clinical Social Worker Orthopedics [phone]) and Surgical [phone])

## 2014-06-13 NOTE — Op Note (Signed)
NAMELIZBEHT, FRATTO NO.:  [id number]  MEDICAL RECORD NO.:  [id number]  LOCATION:  5N23C                        FACILITY:  [facility]  PHYSICIAN:  Alta Corning, M.D.   DATE OF BIRTH:  01/26/1935  DATE OF PROCEDURE:  06/12/2014 DATE OF DISCHARGE:                              OPERATIVE REPORT   PREOPERATIVE DIAGNOSES:  Reverse obliquity subtrochanteric/intertrochanteric fracture with questionable basicervical component.  POSTOPERATIVE DIAGNOSES:  Reverse obliquity subtrochanteric/intertrochanteric fracture with questionable basicervical component.  PRINCIPAL PROCEDURES:  Open reduction and internal fixation of subtrochanteric with questionable basicervical component with greater trochanteric entry rod with a lag and derotational screw proximally and a locking screw distally.  SURGEON:  Alta Corning, M.D.  ASSISTANT:  Gary Fleet, PA  ANESTHESIA:  General.  BRIEF HISTORY:  Ms. Planer is a 80 year old female with long history of having fallen earlier today.  X-ray showed severely displaced subtrochanteric fracture.  We had a long discussion about treatment options.  She has had a previous CVA and was on Plavix.  We talked about waiting, but felt that the risk of bleeding, being on Plavix was about the same, was taken to the operating room and after discussion, we elected to take her to the operating room for fixation.  DESCRIPTION OF PROCEDURE:  The patient was taken to the operating room and after adequate anesthesia was obtained with general anesthetic, the patient was placed supine on the operating table.  We then did a manipulative closed reduction of the right lower extremity.  At that point, it appeared that the fracture came close to reduction, although there did appear to be some flexion of the proximal fragment.  We, at that point, prepped and draped in usual sterile fashion and on a traction table, placed the patient.  We then made a small  incision proximal to the greater trochanter and dissected down to the appropriate entry point and then used a sharp awl to gain an entry point.  At that point, we really could not get the proximal fragment reduced.  We made a small incision in the area where the lag screw was entered and dissected down to the femur and then used a small clamp to gain reduction of the femur and held it reduced while we then passed the rod.  We reamed up to 13, did an 11-mm rod and then locked it with a single locking screw and then a derotational screw because intraoperatively once we got it reduced, there was some concern there could have been a basicervical component and so, I felt that a derotational screw would be appropriate. We then locked the lag screw down solid and at that point, went down distally and did a single locking screw in the gliding hole.  At this point, the wounds were copiously irrigated, suctioned dry, closed in layers.  Sterile compressive dressing was applied and the patient was taken to the recovery room, she was noted to be in satisfactory condition.  Estimated blood loss for the procedure was 300 mL.  Of note, fluoroscopy was used intraoperatively and multiple fluoroscopic images were interpreted intraoperatively.     Alta Corning, M.D.     Corliss Skains  D:  06/12/2014  T:  06/13/2014  Job:  [id number]

## 2014-06-13 NOTE — Evaluation (Signed)
Physical Therapy Evaluation Patient Details Name: PATREECE COSTEN MRN: [id number] DOB: 1934-11-09 Today's Date: 06/13/2014   History of Present Illness  Patient was at [facility] the morning of 06/12/14 having x-rays of her right knee performed when she fell while attempting to get on the x-ray table. No head injury or LOC. x-ray showed  right hip fracture. Patient with PMH of CVA and some right sided weakness.  Clinical Impression  Patient did well with bed mobility and transfer.  Did have difficulty attempting ambulation - unable to advance LLE without weight bearing on RLE.  Patient with history of CVA, however, has fair strength throughout and was fairly independent prior to admission. Feel patient would benefit from CIR to reach max potential for d/c home with husband who is very attentive.  Will benefit from PT to progress mobility, strengthen UE for potential ambulation.      Follow Up Recommendations CIR    Equipment Recommendations  None recommended by PT    Recommendations for Other Services Rehab consult     Precautions / Restrictions Precautions Precautions: Fall Restrictions Weight Bearing Restrictions: Yes RLE Weight Bearing: Touchdown weight bearing      Mobility  Bed Mobility Overal bed mobility: Needs Assistance Bed Mobility: Supine to Sit     Supine to sit: Min assist     General bed mobility comments: required assist for RLE and to raise shoulders off bed  Transfers Overall transfer level: Needs assistance Equipment used: Rolling walker (2 wheeled) Transfers: Sit to/from [facility] Sit to Stand: Min assist Stand pivot transfers: Min assist       General transfer comment: required cues for sequencing and to maintain TDWB.    Ambulation/Gait             General Gait Details: unable to ambulate, unable to advance LLE without weight bearing on RLE.  Stairs            Wheelchair Mobility    Modified Rankin  (Stroke Patients Only)       Balance Overall balance assessment: Needs assistance Sitting-balance support: No upper extremity supported;Feet supported Sitting balance-Leahy Scale: Fair     Standing balance support: Bilateral upper extremity supported;During functional activity Standing balance-Leahy Scale: Poor                               Pertinent Vitals/Pain Pain Assessment: Faces Faces Pain Scale: Hurts even more Pain Location: right hip Pain Intervention(s): Limited activity within patient's tolerance;Monitored during session    [place] expects to be discharged to:: Private residence Living Arrangements: Spouse/significant other Available Help at Discharge: Family;Available 24 hours/day Type of Home: House Home Access: Stairs to enter Entrance Stairs-Rails: None Entrance Stairs-Number of Steps: 1 step Home Layout: Two level;Able to live on main level with bedroom/bathroom Home Equipment: Walker - 2 wheels      Prior Function Level of Independence: Independent         Comments: husband guards her consistently, but sounds like she can manuever fairly indepedently.     Hand Dominance        Extremity/Trunk Assessment   Upper Extremity Assessment: Generalized weakness           Lower Extremity Assessment: Generalized weakness;RLE deficits/detail         Communication   Communication: Expressive difficulties;HOH  Cognition Arousal/Alertness: Awake/alert Behavior During Therapy: WFL for tasks assessed/performed Overall Cognitive Status: Within Functional Limits for  tasks assessed                      General Comments      Exercises General Exercises - Lower Extremity Ankle Circles/Pumps: AROM;Strengthening;Both;10 reps;Seated      Assessment/Plan    PT Assessment Patient needs continued PT services  PT Diagnosis Difficulty walking   PT Problem List Decreased activity tolerance;Decreased  balance;Decreased mobility;Decreased knowledge of precautions;Pain  PT Treatment Interventions DME instruction;Gait training;Functional mobility training;Therapeutic activities;Therapeutic exercise;Balance training;Patient/family education   PT Goals (Current goals can be found in the Care Plan section) Acute Rehab PT Goals Patient Stated Goal: get her home with me (per husband) PT Goal Formulation: With patient/family Time For Goal Achievement: 06/20/14 Potential to Achieve Goals: Good    Frequency Min 4X/week   Barriers to discharge        Co-evaluation               End of Session Equipment Utilized During Treatment: Gait belt Activity Tolerance: Patient tolerated treatment well Patient left: in chair;with call bell/phone within reach;with family/visitor present Nurse Communication: Mobility status         Time: 1440-1510 PT Time Calculation (min) (ACUTE ONLY): 30 min   Charges:   PT Evaluation $Initial PT Evaluation Tier I: 1 Procedure PT Treatments $Therapeutic Activity: 8-22 mins   PT G CodesShanna Cisco 06/13/2014, 3:16 PM  06/13/2014 [place], PT [phone]

## 2014-06-13 NOTE — Progress Notes (Signed)
[facility] PROGRESS NOTE  [name] [id number] DOB: June 08, 1934 DOA: 06/12/2014 PCP: Alonza Bogus, MD  Assessment/Plan: Right hip fracture -Appropriate tenderness/swelling present. -PT/OT to evaluate for CIR vs SNF  Acute renal failure -Patient's baseline Cr= 1.1-1.3 -Continue hydration normal saline 50 ml/hr   UTI -Continue ceftriaxone for full 5 day treatment  Expressive aphasia/apraxia  -Per husband at baseline from previous CVA   Code Status: Full Family Communication: Husband present Disposition Plan: CIR vs SNF   Consultants: Dr. Dorna Leitz (orthopedic surgeon)   Procedures: 1/8 hip/pelvis x-ray; Rt Comminuted right intertrochanteric fracture without dislocation. 1/9 right hip Open reduction and internal fixation of subtrochanteric with questionable basicervical component with greater trochanteric entry rod with a lag and derotational screw proximally and a locking screw distally.   Cultures 1/8 urine pending   Antibiotics: Cefazolin 1/8>> stopped 1/9 Ceftriaxone 1/8>> stopped 1/8 Ceftriaxone 1/9>>   HPI/Subjective: [name] is a 80 y.o. 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 Sent from [facility] to ED due to hip fracture. Patient was at [facility] this morning having x-rays of her right knee performed when she fell while attempting to get on the x-ray table. No head injury or LOC. Patient was assisted back to x-ray table and had x-ray performed of right hip which showed hip fracture. Patient thus sent to the ED for further evaluation. patient is hard of hearing and has slurred speech, but able to provide much history. Husband states she is at her baseline mental status.  Initial work up in the Ed showed she also have uti, she received a dose of rocephin.  She is on plavix, no other anti-coagulation. 1/9 husband states  she tripped while in the x-ray room at orthopedic Center. Per husband normally able to ambulate, A/O 4, does have the residual expressive aphasia/apraxia.     Objective: Filed Vitals:   06/12/14 2110 06/13/14 0217 06/13/14 0540 06/13/14 0800  BP: 85/43 92/51 92/33    Pulse: 51 68 71   Temp: 97.9 F (36.6 C) 98.4 F (36.9 C) 97.1 F (36.2 C)   TempSrc: Oral Oral Oral   Resp: 16 17 16 18   SpO2: 97% 99% 99% 99%    Intake/Output Summary (Last 24 hours) at 06/13/14 1319 Last data filed at 06/13/14 0900  Gross per 24 hour  Intake    320 ml  Output   1215 ml  Net   -895 ml   There were no vitals filed for this visit.   Exam: General: A/O 4 (was able to obtain answers with husband's help), NAD, No acute respiratory distress Lungs: Clear to auscultation bilaterally without wheezes or crackles Cardiovascular: Regular rate and rhythm without murmur gallop or rub normal S1 and S2 Abdomen: Nontender, nondistended, soft, bowel sounds positive, no rebound, no ascites, no appreciable mass Extremities: No significant cyanosis, clubbing, or edema left lower extremities; right lower extremity 1+ pedal edema which is expected with her recent surgery. Right hip appropriately swollen, 2 incision sites covered and clean, negative sign of infection. Patient able to wiggle toes of right foot.   Data Reviewed: Basic Metabolic Panel:  Recent Labs Lab 06/12/14 1051 06/13/14 0452  NA 141 141  K 3.5 4.2  CL 110 106  CO2 26 28  GLUCOSE 115* 124*  BUN 22 23  CREATININE 1.33* 1.57*  CALCIUM 9.0 7.8*   Liver Function Tests: No results for input(s): AST, ALT, ALKPHOS, BILITOT, PROT, ALBUMIN in  the last 168 hours. No results for input(s): LIPASE, AMYLASE in the last 168 hours. No results for input(s): AMMONIA in the last 168 hours. CBC:  Recent Labs Lab 06/12/14 1051 06/13/14 0452  WBC 10.8* 11.1*  NEUTROABS 8.4*  --   HGB 12.3 8.8*  HCT 39.2 27.9*  MCV 81.7 84.0  PLT 309 288    Cardiac Enzymes: No results for input(s): CKTOTAL, CKMB, CKMBINDEX, TROPONINI in the last 168 hours. BNP (last 3 results) No results for input(s): PROBNP in the last 8760 hours. CBG: No results for input(s): GLUCAP in the last 168 hours.  No results found for this or any previous visit (from the past 240 hour(s)).   Studies: Dg Chest 1 View  06/12/2014   CLINICAL DATA:  [place], hip fracture  EXAM: CHEST - 1 VIEW  COMPARISON:  05/13/2011  FINDINGS: Moderate cardiomegaly. Mild bilateral interstitial edema or infiltrates, slightly increased since prior study. Atheromatous ectatic aorta. No effusion.  No pneumothorax. Mild spondylitic changes in the lower thoracic spine.  IMPRESSION: 1. Worsening cardiomegaly and bilateral interstitial edema/infiltrates.   Electronically Signed   By: Arne Cleveland M.D.   On: 06/12/2014 13:33   Dg C-arm 61-120 Min  06/12/2014   CLINICAL DATA:  Right femur fracture, fixation  EXAM: DG C-ARM 61-120 MIN; DG FEMUR 2+V*R*  : COMPARISON:  06/12/2014  FINDINGS: Intra medullary rod has been placed. There is now near anatomic position and alignment of intertrochanteric right femur fracture.  IMPRESSION: ORIF right femur fracture   Electronically Signed   By: Skipper Cliche M.D.   On: 06/12/2014 20:40   Dg Hip Unilat With Pelvis 2-3 Views Right  06/12/2014   CLINICAL DATA:  Status post fall  EXAM: DG HIP W/ PELVIS 2-3V*R*  COMPARISON:  None.  FINDINGS: There is a comminuted, displaced and mildly angulated right intertrochanteric fracture. There is no hip dislocation. There is a bone island in the left femoral neck. There is generalized osteopenia.  IMPRESSION: Comminuted right intertrochanteric fracture without dislocation.   Electronically Signed   By: Kathreen Devoid   On: 06/12/2014 13:40   Dg Femur, Min 2 Views Right  06/12/2014   CLINICAL DATA:  Right femur fracture, fixation  EXAM: DG C-ARM 61-120 MIN; DG FEMUR 2+V*R*  : COMPARISON:  06/12/2014  FINDINGS: Intra medullary  rod has been placed. There is now near anatomic position and alignment of intertrochanteric right femur fracture.  IMPRESSION: ORIF right femur fracture   Electronically Signed   By: Skipper Cliche M.D.   On: 06/12/2014 20:40    Scheduled Meds: . docusate sodium  100 mg Oral BID  . ferrous sulfate  325 mg Oral BID WC  . morphine  2 mg Intravenous Once  . multivitamin with minerals  1 tablet Oral Daily  . rivaroxaban  10 mg Oral Daily  . rosuvastatin  20 mg Oral Daily  . senna  1 tablet Oral Daily   Continuous Infusions: . sodium chloride 50 mL/hr at 06/13/14 0503  . dextrose 5 % and 0.45% NaCl      Active Problems:   Closed right hip fracture   Hip fracture   UTI (urinary tract infection)   Fall   UTI (lower urinary tract infection)    Time spent: 45 minute    WOODS, [place], J  [facility] Pager [phone]. If 7PM-7AM, please contact night-coverage at [email or web address], password [email or web address] 06/13/2014, 1:19 PM  LOS: 1 day

## 2014-06-13 NOTE — Evaluation (Signed)
Occupational Therapy Evaluation Patient Details Name: [name] MRN: [id number] DOB: 1935/03/04 Today's Date: 06/13/2014    History of Present Illness Patient was at [facility] the morning of 06/12/14 having x-rays of her right knee performed when she fell while attempting to get on the x-ray table. No head injury or LOC. x-ray showed  right hip fracture. Patient with PMH of CVA and some right sided weakness.   Clinical Impression   Pt was able to perform bathing, dressing, and toileting at a modified independent level prior to admission.  She ambulated without a device in the house.  Pt assisted her husband with IADL.  Today she required +2 assist for stand pivot transfer without RW to [facility] for safety and to maintain TDWB precautions.  She is currently dependent in LB ADL.  Pt has excellent support of her husband at home who was instrumental in her recovery from a CVA 5 years ago.  Pt will require intense rehab.  Will follow acutely.    Follow Up Recommendations  CIR;Supervision/Assistance - 24 hour    Equipment Recommendations  None recommended by OT    Recommendations for Other Services       Precautions / Restrictions Precautions Precautions: Fall Restrictions Weight Bearing Restrictions: Yes RLE Weight Bearing: Touchdown weight bearing      Mobility Bed Mobility     General bed mobility comments: pt up in chair  Transfers Overall transfer level: Needs assistance Equipment used: 2 person hand held assist Transfers: Sit to/from Stand;Stand Pivot Transfers Sit to Stand: +2 physical assistance;Min assist Stand pivot transfers: +2 physical assistance;Min assist       General transfer comment: required cues for sequencing and to maintain TDWB.      Balance Overall balance assessment: Needs assistance Sitting-balance support: No upper extremity supported;Feet supported Sitting balance-Leahy Scale: Fair     Standing balance support: Bilateral upper  extremity supported;During functional activity Standing balance-Leahy Scale: Poor                              ADL Overall ADL's : Needs assistance/impaired Eating/Feeding: Sitting;Set up   Grooming: Wash/dry hands;Set up;Sitting   Upper Body Bathing: Supervision/ safety;Sitting   Lower Body Bathing: Total assistance;+2 for physical assistance;Sit to/from stand   Upper Body Dressing : Supervision/safety;Sitting   Lower Body Dressing: +2 for physical assistance;Total assistance;Sit to/from stand   Toilet Transfer: +2 for physical assistance;Minimal assistance;Stand-pivot;BSC   Toileting- Clothing Manipulation and Hygiene: Supervision/safety;Sitting/lateral lean       Functional mobility during ADLs: +2 for physical assistance;Minimal assistance (unable to ambulate, transferred only) General ADL Comments: Pt has an arthritic R knee, typically bends forward seated on a low bench to donn and doff her socks.       Vision                     Perception     Praxis      Pertinent Vitals/Pain Pain Assessment: Faces Faces Pain Scale: Hurts little more Pain Location: R hip Pain Descriptors / Indicators: Grimacing Pain Intervention(s): Limited activity within patient's tolerance;Monitored during session;Premedicated before session;Repositioned     Hand Dominance Left (since CVA)   Extremity/Trunk Assessment Upper Extremity Assessment Upper Extremity Assessment: RUE deficits/detail RUE Deficits / Details: functional assist for bilateral tasks, full AROM but with residual weakness from previous CVA RUE Coordination: decreased fine motor;decreased gross motor   Lower Extremity Assessment Lower Extremity Assessment: Defer  to PT evaluation RLE: Unable to fully assess due to pain   Cervical / Trunk Assessment Cervical / Trunk Assessment: Normal   Communication Communication Communication: Expressive difficulties;HOH   Cognition Arousal/Alertness:  Awake/alert Behavior During Therapy: Impulsive Overall Cognitive Status: History of cognitive impairments - at baseline                     General Comments       Exercises       Shoulder Instructions      Home Living Family/patient expects to be discharged to:: Private residence Living Arrangements: Spouse/significant other Available Help at Discharge: Family;Available 24 hours/day Type of Home: House Home Access: Stairs to enter [employer] of Steps: 1 step Entrance Stairs-Rails: None Home Layout: Two level;Able to live on main level with bedroom/bathroom     Bathroom Shower/Tub: Walk-in shower   Bathroom Toilet: Handicapped height     Home Equipment: Environmental consultant - 2 wheels;Shower seat;Grab bars - toilet;Grab bars - tub/shower;Wheelchair - manual          Prior Functioning/Environment Level of Independence: Needs assistance  Gait / Transfers Assistance Needed: walked without device, w/c for longer distances ADL's / Homemaking Assistance Needed: bathed, dressed, toileted mod I, assisted with meal prep and light housekeeping Communication / Swallowing Assistance Needed: expressive difficulties from previous CVA Comments: husband guards her consistently, but sounds like she can manuever fairly indepedently.    OT Diagnosis: Generalized weakness;Cognitive deficits;Disturbance of vision;Acute pain;Hemiplegia dominant side   OT Problem List: Decreased strength;Decreased activity tolerance;Impaired balance (sitting and/or standing);Impaired vision/perception;Decreased coordination;Decreased cognition;Decreased safety awareness;Decreased knowledge of use of DME or AE;Decreased knowledge of precautions;Obesity;Impaired UE functional use;Pain   OT Treatment/Interventions: Self-care/ADL training;DME and/or AE instruction;Therapeutic activities;Patient/family education;Balance training    OT Goals(Current goals can be found in the care plan section) Acute Rehab OT  Goals Patient Stated Goal: get her home with me (per husband) OT Goal Formulation: With patient Time For Goal Achievement: 06/27/14 Potential to Achieve Goals: Good ADL Goals Pt Will Perform Lower Body Bathing: with min assist;sit to/from stand Pt Will Perform Lower Body Dressing: with min assist;sit to/from stand Pt Will Transfer to Toilet: with min assist;ambulating;bedside commode (over toilet) Pt Will Perform Toileting - Clothing Manipulation and hygiene: with min assist;sit to/from stand  OT Frequency: Min 2X/week   Barriers to D/C:            Co-evaluation              End of Session Nurse Communication:  (pt urinated)  Activity Tolerance: Patient tolerated treatment well Patient left: in chair;with call bell/phone within reach;with family/visitor present   Time: [id number] OT Time Calculation (min): 45 min Charges:  OT General Charges $OT Visit: 1 Procedure OT Evaluation $Initial OT Evaluation Tier I: 1 Procedure OT Treatments $Self Care/Home Management : 8-22 mins G-Codes:    Malka So 06/13/2014, 4:53 PM  [phone]

## 2014-06-13 NOTE — Progress Notes (Signed)
Orthopedic Tech Progress Note Patient Details:  [name] 1935/04/12 [id number]  Ortho Devices Ortho Device/Splint Location: trapeze bar patient helper   [name] 06/13/2014, 9:24 AM

## 2014-06-13 NOTE — Progress Notes (Signed)
Subjective: 1 Day Post-Op Procedure(s) (LRB): INTRAMEDULLARY (IM) NAIL INTERTROCHANTRIC RIGHT HIP (Right) Patient reports pain as mild.  Taking oral fluids. Foley catheter still in place. Husband at bedside.  Objective: Vital signs in last 24 hours: Temp:  [97.1 F (36.2 C)-98.7 F (37.1 C)] 97.1 F (36.2 C) (01/09 0540) Pulse Rate:  [48-90] 71 (01/09 0540) Resp:  [9-29] 18 (01/09 0800) BP: (85-175)/(33-118) 92/33 mmHg (01/09 0540) SpO2:  [94 %-100 %] 99 % (01/09 0800)  Intake/Output from previous day: 01/08 0701 - 01/09 0700 In: -  Out: 979 [Urine:829; Blood:150] Intake/Output this shift:     Recent Labs  06/12/14 1051 06/13/14 0452  HGB 12.3 8.8*    Recent Labs  06/12/14 1051 06/13/14 0452  WBC 10.8* 11.1*  RBC 4.80 3.32*  HCT 39.2 27.9*  PLT 309 288    Recent Labs  06/12/14 1051 06/13/14 0452  NA 141 141  K 3.5 4.2  CL 110 106  CO2 26 28  BUN 22 23  CREATININE 1.33* 1.57*  GLUCOSE 115* 124*  CALCIUM 9.0 7.8*    Recent Labs  06/12/14 1051  INR 1.01   Right hip exam: Neurovascular intact Sensation intact distally Intact pulses distally Dorsiflexion/Plantar flexion intact Incision: dressing C/D/I Compartment soft Moderate right thigh swelling. Thigh high TED hose in place. Assessment/Plan: 1 Day Post-Op Procedure(s) (LRB): INTRAMEDULLARY (IM) NAIL INTERTROCHANTRIC RIGHT HIP (Right) History of CVA. Expected postop blood loss anemia. Plan: Up with therapy touchdown weightbearing on right. Will start Xarelto 10 mg today at noon 1 daily. Watch hemoglobin. CIR consult  Samiya Mervin G 06/13/2014, 8:53 AM

## 2014-06-13 NOTE — Progress Notes (Signed)
Utilization review completed.  

## 2014-06-14 DIAGNOSIS — N179 Acute kidney failure, unspecified: Secondary | ICD-10-CM

## 2014-06-14 DIAGNOSIS — D62 Acute posthemorrhagic anemia: Secondary | ICD-10-CM | POA: Insufficient documentation

## 2014-06-14 DIAGNOSIS — R4701 Aphasia: Secondary | ICD-10-CM | POA: Insufficient documentation

## 2014-06-14 LAB — BASIC METABOLIC PANEL
ANION GAP: 7 (ref 5–15)
BUN: 25 mg/dL — ABNORMAL HIGH (ref 6–23)
CO2: 27 mmol/L (ref 19–32)
CREATININE: 1.56 mg/dL — AB (ref 0.50–1.10)
Calcium: 7.9 mg/dL — ABNORMAL LOW (ref 8.4–10.5)
Chloride: 108 mEq/L (ref 96–112)
GFR calc non Af Amer: 31 mL/min — ABNORMAL LOW (ref >90)
GFR, EST AFRICAN AMERICAN: 36 mL/min — AB (ref >90)
GLUCOSE: 154 mg/dL — AB (ref 70–99)
Potassium: 3.6 mmol/L (ref 3.5–5.1)
Sodium: 142 mmol/L (ref 135–145)

## 2014-06-14 LAB — CBC
HCT: 23.7 % — ABNORMAL LOW (ref 36.0–46.0)
HEMOGLOBIN: 7.4 g/dL — AB (ref 12.0–15.0)
MCH: 26 pg (ref 26.0–34.0)
MCHC: 31.2 g/dL (ref 30.0–36.0)
MCV: 83.2 fL (ref 78.0–100.0)
Platelets: 247 10*3/uL (ref 150–400)
RBC: 2.85 MIL/uL — ABNORMAL LOW (ref 3.87–5.11)
RDW: 14.9 % (ref 11.5–15.5)
WBC: 8 10*3/uL (ref 4.0–10.5)

## 2014-06-14 LAB — PREPARE RBC (CROSSMATCH)

## 2014-06-14 MED ORDER — SODIUM CHLORIDE 0.9 % IV SOLN
Freq: Once | INTRAVENOUS | Status: AC
  Administered 2014-06-14: 14:00:00 via INTRAVENOUS

## 2014-06-14 MED ORDER — CEFTRIAXONE SODIUM IN DEXTROSE 20 MG/ML IV SOLN
1.0000 g | INTRAVENOUS | Status: DC
  Administered 2014-06-14 – 2014-06-15 (×2): 1 g via INTRAVENOUS
  Filled 2014-06-14 (×3): qty 50

## 2014-06-14 NOTE — Progress Notes (Signed)
Patient with HR in 100-110's during blood transfusion, no s/s if distress or transfusion reaction.  Dr. Sherral Hammers aware, no new orders, nursing will continue to monitor.

## 2014-06-14 NOTE — Progress Notes (Signed)
Rehab Admissions Coordinator Note:  Patient was screened by Cleatrice Burke for appropriateness for an Inpatient Acute Rehab Consult.  At this time, we await rehab consult completion on Monday. Any rehab venue will have to be approved by [employer] insurance which is [employer].  Cleatrice Burke 06/14/2014, 1:37 PM  I can be reached at [phone].

## 2014-06-14 NOTE — Discharge Instructions (Signed)
Information on my medicine - XARELTO® (Rivaroxaban) ° °This medication education was reviewed with me or my healthcare representative as part of my discharge preparation.  The pharmacist that spoke with me during my hospital stay was:  Elye Harmsen Kay, RPH ° °Why was Xarelto® prescribed for you? °Xarelto® was prescribed for you to reduce the risk of blood clots forming after orthopedic surgery. The medical term for these abnormal blood clots is venous thromboembolism (VTE). ° °What do you need to know about xarelto® ? °Take your Xarelto® ONCE DAILY at the same time every day. °You may take it either with or without food. ° °If you have difficulty swallowing the tablet whole, you may crush it and mix in applesauce just prior to taking your dose. ° °Take Xarelto® exactly as prescribed by your doctor and DO NOT stop taking Xarelto® without talking to the doctor who prescribed the medication.  Stopping without other VTE prevention medication to take the place of Xarelto® may increase your risk of developing a clot. ° °After discharge, you should have regular check-up appointments with your healthcare provider that is prescribing your Xarelto®.   ° °What do you do if you miss a dose? °If you miss a dose, take it as soon as you remember on the same day then continue your regularly scheduled once daily regimen the next day. Do not take two doses of Xarelto® on the same day.  ° °Important Safety Information °A possible side effect of Xarelto® is bleeding. You should call your healthcare provider right away if you experience any of the following: °? Bleeding from an injury or your nose that does not stop. °? Unusual colored urine (red or dark brown) or unusual colored stools (red or black). °? Unusual bruising for unknown reasons. °? A serious fall or if you hit your head (even if there is no bleeding). ° °Some medicines may interact with Xarelto® and might increase your risk of bleeding while on Xarelto®. To help avoid  this, consult your healthcare provider or pharmacist prior to using any new prescription or non-prescription medications, including herbals, vitamins, non-steroidal anti-inflammatory drugs (NSAIDs) and supplements. ° °This website has more information on Xarelto®: www.xarelto.com. ° ° °

## 2014-06-14 NOTE — Progress Notes (Signed)
Subjective: 2 Days Post-Op Procedure(s) (LRB): INTRAMEDULLARY (IM) NAIL INTERTROCHANTRIC RIGHT HIP (Right) Patient reports pain as moderate.  Mild dizziness. Taking by mouth and voiding okay.  Objective: Vital signs in last 24 hours: Temp:  [98.4 F (36.9 C)-99 F (37.2 C)] 98.4 F (36.9 C) (01/10 0449) Pulse Rate:  [90-102] 90 (01/10 0449) Resp:  [16-18] 16 (01/10 0449) BP: (107-122)/(38-52) 107/43 mmHg (01/10 0449) SpO2:  [91 %-94 %] 93 % (01/10 0449)  Intake/Output from previous day: 01/09 0701 - 01/10 0700 In: 440 [P.O.:440] Out: 450 [Urine:450] Intake/Output this shift: Total I/O In: -  Out: 200 [Urine:200]   Recent Labs  06/12/14 1051 06/13/14 0452 06/14/14 0440  HGB 12.3 8.8* 7.4*    Recent Labs  06/13/14 0452 06/14/14 0440  WBC 11.1* 8.0  RBC 3.32* 2.85*  HCT 27.9* 23.7*  PLT 288 247    Recent Labs  06/13/14 0452 06/14/14 0440  NA 141 142  K 4.2 3.6  CL 106 108  CO2 28 27  BUN 23 25*  CREATININE 1.57* 1.56*  GLUCOSE 124* 154*  CALCIUM 7.8* 7.9*    Recent Labs  06/12/14 1051  INR 1.01   Right hip exam: Right hip incisions clean and dry. Dressings intact. Mild right thigh swelling. Right calf is soft with minimal tenderness. Good ankle plantar and dorsi flexor strength in the right lower extremity.   Assessment/Plan: 2 Days Post-Op Procedure(s) (LRB): INTRAMEDULLARY (IM) NAIL INTERTROCHANTRIC RIGHT HIP (Right)  Acute blood loss anemia. Plan: Up with therapy touchdown weightbearing on right. Agree with need for transfusion of 2 units packed RBCs for acute blood loss anemia. Continue Xarelto per neurology. Will follow. Hopefully is good candidate for CIR.  [place] G 06/14/2014, 11:45 AM

## 2014-06-14 NOTE — Progress Notes (Signed)
[facility] PROGRESS NOTE  [name] [id number] DOB: Jun 20, 1934 DOA: 06/12/2014 PCP: Alonza Bogus, MD  Assessment/Plan: Right hip fracture -Appropriate tenderness/swelling present. -PT/OT to evaluate for CIR vs SNF  Acute renal failure -Most likely secondary to patient's UTI and recent surgery -Patient's baseline Cr= 1.1-1.3 -1/10 Cr= 1.56, will continue to monitor closely; -Continue hydration normal saline 75 ml/hr  UTI -Continue ceftriaxone for full 5 day treatment  Acute blood loss Anemia -1/10 transfuse 2 units PRBC  Expressive aphasia/apraxia  -Per husband at baseline from previous CVA   Code Status: Full Family Communication: Husband present Disposition Plan: CIR vs SNF   Consultants: Dr. Dorna Leitz (orthopedic surgeon)   Procedures: 1/8 hip/pelvis x-ray; Rt Comminuted right intertrochanteric fracture without dislocation. 1/9 right hip Open reduction and internal fixation of subtrochanteric with questionable basicervical component with greater trochanteric entry rod with a lag and derotational screw proximally and a locking screw distally. 1/10 transfuse 2 units PRBCs   Cultures 1/8 urine pending   Antibiotics: Cefazolin 1/8>> stopped 1/9 Ceftriaxone 1/8>> stopped 1/8 Ceftriaxone 1/9>>   HPI/Subjective: [name] is a 80 y.o. 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 Sent from [facility] to ED due to hip fracture. Patient was at [facility] this morning having x-rays of her right knee performed when she fell while attempting to get on the x-ray table. No head injury or LOC. Patient was assisted back to x-ray table and had x-ray performed of right hip which showed hip fracture. Patient thus sent to the ED for further evaluation. patient is hard of hearing and has slurred speech, but able to provide much history. Husband states  she is at her baseline mental status.  Initial work up in the Ed showed she also have uti, she received a dose of rocephin.  She is on plavix, no other anti-coagulation. 1/9 husband states she tripped while in the x-ray room at orthopedic Center. Per husband normally able to ambulate, A/O 4, does have the residual expressive aphasia/apraxia.     Objective: Filed Vitals:   06/13/14 2100 06/14/14 0000 06/14/14 0400 06/14/14 0449  BP: 113/38 110/52  107/43  Pulse: 102 100  90  Temp: 99 F (37.2 C)   98.4 F (36.9 C)  TempSrc: Oral   Oral  Resp: 16 18 16 16   SpO2: QA348G [id number] X33443 93%    Intake/Output Summary (Last 24 hours) at 06/14/14 1049 Last data filed at 06/14/14 1008  Gross per 24 hour  Intake    120 ml  Output    400 ml  Net   -280 ml   There were no vitals filed for this visit.   Exam: General: A/O 4 (was able to obtain answers with husband's help), NAD, No acute respiratory distress Lungs: Clear to auscultation bilaterally without wheezes or crackles Cardiovascular: Regular rate and rhythm without murmur gallop or rub normal S1 and S2 Abdomen: Nontender, nondistended, soft, bowel sounds positive, no rebound, no ascites, no appreciable mass Extremities: No significant cyanosis, clubbing, or edema left lower extremities; right lower extremity 1+ pedal edema which is expected with her recent surgery. Right hip appropriately swollen, 2 incision sites covered and clean, negative sign of infection. Patient able to wiggle toes of right foot.   Data Reviewed: Basic Metabolic Panel:  Recent Labs Lab 06/12/14 1051 06/13/14 0452 06/14/14 0440  NA 141 141 142  K 3.5 4.2 3.6  CL 110 106 108  CO2  26 28 27   GLUCOSE 115* 124* 154*  BUN 22 23 25*  CREATININE 1.33* 1.57* 1.56*  CALCIUM 9.0 7.8* 7.9*   Liver Function Tests: No results for input(s): AST, ALT, ALKPHOS, BILITOT, PROT, ALBUMIN in the last 168 hours. No results for input(s): LIPASE, AMYLASE in the last 168  hours. No results for input(s): AMMONIA in the last 168 hours. CBC:  Recent Labs Lab 06/12/14 1051 06/13/14 0452 06/14/14 0440  WBC 10.8* 11.1* 8.0  NEUTROABS 8.4*  --   --   HGB 12.3 8.8* 7.4*  HCT 39.2 27.9* 23.7*  MCV 81.7 84.0 83.2  PLT 309 288 247   Cardiac Enzymes: No results for input(s): CKTOTAL, CKMB, CKMBINDEX, TROPONINI in the last 168 hours. BNP (last 3 results) No results for input(s): PROBNP in the last 8760 hours. CBG: No results for input(s): GLUCAP in the last 168 hours.  No results found for this or any previous visit (from the past 240 hour(s)).   Studies: Dg Chest 1 View  06/12/2014   CLINICAL DATA:  [place], hip fracture  EXAM: CHEST - 1 VIEW  COMPARISON:  05/13/2011  FINDINGS: Moderate cardiomegaly. Mild bilateral interstitial edema or infiltrates, slightly increased since prior study. Atheromatous ectatic aorta. No effusion.  No pneumothorax. Mild spondylitic changes in the lower thoracic spine.  IMPRESSION: 1. Worsening cardiomegaly and bilateral interstitial edema/infiltrates.   Electronically Signed   By: Arne Cleveland M.D.   On: 06/12/2014 13:33   Dg C-arm 61-120 Min  06/12/2014   CLINICAL DATA:  Right femur fracture, fixation  EXAM: DG C-ARM 61-120 MIN; DG FEMUR 2+V*R*  : COMPARISON:  06/12/2014  FINDINGS: Intra medullary rod has been placed. There is now near anatomic position and alignment of intertrochanteric right femur fracture.  IMPRESSION: ORIF right femur fracture   Electronically Signed   By: Skipper Cliche M.D.   On: 06/12/2014 20:40   Dg Hip Unilat With Pelvis 2-3 Views Right  06/12/2014   CLINICAL DATA:  Status post fall  EXAM: DG HIP W/ PELVIS 2-3V*R*  COMPARISON:  None.  FINDINGS: There is a comminuted, displaced and mildly angulated right intertrochanteric fracture. There is no hip dislocation. There is a bone island in the left femoral neck. There is generalized osteopenia.  IMPRESSION: Comminuted right intertrochanteric fracture without  dislocation.   Electronically Signed   By: Kathreen Devoid   On: 06/12/2014 13:40   Dg Femur, Min 2 Views Right  06/12/2014   CLINICAL DATA:  Right femur fracture, fixation  EXAM: DG C-ARM 61-120 MIN; DG FEMUR 2+V*R*  : COMPARISON:  06/12/2014  FINDINGS: Intra medullary rod has been placed. There is now near anatomic position and alignment of intertrochanteric right femur fracture.  IMPRESSION: ORIF right femur fracture   Electronically Signed   By: Skipper Cliche M.D.   On: 06/12/2014 20:40    Scheduled Meds: . cefTRIAXone (ROCEPHIN)  IV  1 g Intravenous Q24H  . docusate sodium  100 mg Oral BID  . ferrous sulfate  325 mg Oral BID WC  . morphine  2 mg Intravenous Once  . multivitamin with minerals  1 tablet Oral Daily  . rivaroxaban  10 mg Oral Daily  . rosuvastatin  20 mg Oral Daily  . senna  1 tablet Oral Daily   Continuous Infusions: . sodium chloride 50 mL/hr at 06/13/14 0503  . dextrose 5 % and 0.45% NaCl      Active Problems:   Closed right hip fracture   Hip fracture  UTI (urinary tract infection)   Fall   UTI (lower urinary tract infection)   Expressive aphasia    Time spent: 45 minute    WOODS, [place], J  [facility] Pager [phone]. If 7PM-7AM, please contact night-coverage at [email or web address], password [facility] 06/14/2014, 10:49 AM  LOS: 2 days

## 2014-06-15 ENCOUNTER — Encounter (HOSPITAL_COMMUNITY): Payer: Self-pay | Admitting: Orthopedic Surgery

## 2014-06-15 LAB — TYPE AND SCREEN
ABO/RH(D): O POS
Antibody Screen: NEGATIVE
UNIT DIVISION: 0
UNIT DIVISION: 0

## 2014-06-15 LAB — BASIC METABOLIC PANEL
ANION GAP: 5 (ref 5–15)
BUN: 19 mg/dL (ref 6–23)
CO2: 25 mmol/L (ref 19–32)
Calcium: 7.9 mg/dL — ABNORMAL LOW (ref 8.4–10.5)
Chloride: 111 mEq/L (ref 96–112)
Creatinine, Ser: 1.12 mg/dL — ABNORMAL HIGH (ref 0.50–1.10)
GFR calc non Af Amer: 46 mL/min — ABNORMAL LOW (ref >90)
GFR, EST AFRICAN AMERICAN: 53 mL/min — AB (ref >90)
Glucose, Bld: 121 mg/dL — ABNORMAL HIGH (ref 70–99)
Potassium: 3.7 mmol/L (ref 3.5–5.1)
Sodium: 141 mmol/L (ref 135–145)

## 2014-06-15 LAB — CBC
HCT: 30.3 % — ABNORMAL LOW (ref 36.0–46.0)
Hemoglobin: 9.8 g/dL — ABNORMAL LOW (ref 12.0–15.0)
MCH: 27.5 pg (ref 26.0–34.0)
MCHC: 32.3 g/dL (ref 30.0–36.0)
MCV: 85.1 fL (ref 78.0–100.0)
PLATELETS: 203 10*3/uL (ref 150–400)
RBC: 3.56 MIL/uL — ABNORMAL LOW (ref 3.87–5.11)
RDW: 14.7 % (ref 11.5–15.5)
WBC: 8.8 10*3/uL (ref 4.0–10.5)

## 2014-06-15 MED ORDER — RIVAROXABAN 10 MG PO TABS: 10.0000 mg | ORAL_TABLET | Freq: Every day | ORAL | Status: DC

## 2014-06-15 MED ORDER — POLYETHYLENE GLYCOL 3350 17 G PO PACK
17.0000 g | PACK | Freq: Every day | ORAL | Status: DC
  Administered 2014-06-16: 17 g via ORAL
  Filled 2014-06-15: qty 1

## 2014-06-15 NOTE — Progress Notes (Signed)
Subjective: 3 Days Post-Op Procedure(s) (LRB): INTRAMEDULLARY (IM) NAIL INTERTROCHANTRIC RIGHT HIP (Right) Patient reports pain as mild.  Taking po/voiding ok.Feels better since transfusion.  Objective: Vital signs in last 24 hours: Temp:  [98.3 F (36.8 C)-99.7 F (37.6 C)] 98.3 F (36.8 C) (01/11 0556) Pulse Rate:  [83-112] 84 (01/11 0556) Resp:  [16-18] 16 (01/11 0556) BP: (97-144)/(43-63) 141/63 mmHg (01/11 0556) SpO2:  [93 %-99 %] 93 % (01/11 0556)  Intake/Output from previous day: 01/10 0701 - 01/11 0700 In: 1150 [P.O.:480; Blood:670] Out: 200 [Urine:200] Intake/Output this shift:     Recent Labs  06/13/14 0452 06/14/14 0440 06/15/14 0619  HGB 8.8* 7.4* 9.8*    Recent Labs  06/14/14 0440 06/15/14 0619  WBC 8.0 8.8  RBC 2.85* 3.56*  HCT 23.7* 30.3*  PLT 247 203    Recent Labs  06/14/14 0440 06/15/14 0619  NA 142 141  K 3.6 3.7  CL 108 111  CO2 27 25  BUN 25* 19  CREATININE 1.56* 1.12*  GLUCOSE 154* 121*  CALCIUM 7.9* 7.9*   No results for input(s): LABPT, INR in the last 72 hours. Right hip exam: Neurovascular intact Sensation intact distally Intact pulses distally Dorsiflexion/Plantar flexion intact Incision: dressing C/D/I Compartment soft  Assessment/Plan: 3 Days Post-Op Procedure(s) (LRB): INTRAMEDULLARY (IM) NAIL INTERTROCHANTRIC RIGHT HIP (Right)  Acute blood loss anemia improved. Plan: CIR vs SNF.  Hopefully CIR Cont xarelto TDWB on right Will follow  Douglass Dunshee G 06/15/2014, 12:44 PM

## 2014-06-15 NOTE — Consult Note (Signed)
Physical Medicine and Rehabilitation Consult Reason for Consult: Right subtrochanteric intertrochanteric hip fracture Referring Physician: [facility]   HPI: [name] is a 80 y.o. right handed female with history of hypertension, right carotid stenosis with CVA/left MCA distribution infarct 2010 and residual slurred speech and right-sided weakness maintained on Plavix for CVA prophylaxis. Patient lives with her husband independent with walker prior to admission. He presented 06/12/2014 after a fall while at [facility] orthopedic office for x-rays of the right knee secondary to degenerative changes. There was no loss of consciousness. X-rays and imaging revealed a right subtrochanteric intertrochanteric hip fracture. Underwent ORIF 06/12/2014 per Dr. Dorna Leitz. Touchdown weightbearing right lower extremity. Hospital course pain management. Placed on Xarelto for DVT prophylaxis. Urine culture greater than 100,000 gram-negative rods placed on intravenous Rocephin. Acute blood loss anemia 8.8 and monitored. Physical and occupational therapy evaluations completed with recommendations of physical medicine rehabilitation consult.   Review of Systems  HENT: Positive for hearing loss.   Cardiovascular: Positive for palpitations.  Gastrointestinal: Positive for constipation.  Musculoskeletal: Positive for myalgias.  All other systems reviewed and are negative.  Past Medical History  Diagnosis Date  . High cholesterol   . Hearing impaired person   . Hypertension   . Stroke     speech slightly , right sided weakness  . Kidney stones   . Degenerative joint disease of knee, right     right knee  . History of blood transfusion    Past Surgical History  Procedure Laterality Date  . Kidney stones    . Lithotripsy    . Peg placement      and removal  . Peg tube removal    . Tympanoplasty    . Tonsillectomy    . Appendectomy    . Radiology with anesthesia  04/22/2012    Procedure:  RADIOLOGY WITH ANESTHESIA;  Surgeon: Rob Hickman, MD;  Location: [place];  Service: Radiology;  Laterality: N/A;  VERTEBRAL ARTERY STENT PLACEMENT AND CEREBRAL ANGIOGRAM   History reviewed. No pertinent family history. Social History:  reports that she has quit smoking. She does not have any smokeless tobacco history on file. She reports that she drinks alcohol. She reports that she does not use illicit drugs. Allergies: No Known Allergies Medications Prior to Admission  Medication Sig Dispense Refill  . clopidogrel (PLAVIX) 75 MG tablet Take 75 mg by mouth daily.      Marland Kitchen lisinopril-hydrochlorothiazide (PRINZIDE,ZESTORETIC) 20-12.5 MG per tablet Take 1 tablet by mouth daily.      . Multiple Vitamins-Minerals (CENTRUM ULTRA WOMENS) TABS Take 1 tablet by mouth daily.      . naproxen sodium (ANAPROX) 220 MG tablet Take 440 mg by mouth 2 (two) times daily with a meal. For pain    . rosuvastatin (CRESTOR) 20 MG tablet Take 20 mg by mouth daily.    Marland Kitchen senna (SENOKOT) 8.6 MG tablet Take 1 tablet by mouth daily.        Home: Home Living Family/patient expects to be discharged to:: Private residence Living Arrangements: Spouse/significant other Available Help at Discharge: Family, Available 24 hours/day Type of Home: House Home Access: Stairs to enter [employer] of Steps: 1 step Entrance Stairs-Rails: None Home Layout: Two level, Able to live on main level with bedroom/bathroom Home Equipment: Environmental consultant - 2 wheels, Shower seat, Grab bars - toilet, Grab bars - tub/shower, Wheelchair - manual  Functional History: Prior Function Level of Independence: Needs assistance Gait / Transfers  Assistance Needed: walked without device, w/c for longer distances ADL's / Homemaking Assistance Needed: bathed, dressed, toileted mod I, assisted with meal prep and light housekeeping Communication / Swallowing Assistance Needed: expressive difficulties from previous CVA Comments: husband guards her  consistently, but sounds like she can manuever fairly indepedently. Functional Status:  Mobility: Bed Mobility Overal bed mobility: Needs Assistance Bed Mobility: Supine to Sit Supine to sit: Min assist General bed mobility comments: pt up in chair Transfers Overall transfer level: Needs assistance Equipment used: 2 person hand held assist Transfers: Sit to/from Stand, Stand Pivot Transfers Sit to Stand: +2 physical assistance, Min assist Stand pivot transfers: +2 physical assistance, Min assist General transfer comment: required cues for sequencing and to maintain TDWB.   Ambulation/Gait General Gait Details: unable to ambulate, unable to advance LLE without weight bearing on RLE.    ADL: ADL Overall ADL's : Needs assistance/impaired Eating/Feeding: Sitting, Set up Grooming: Wash/dry hands, Set up, Sitting Upper Body Bathing: Supervision/ safety, Sitting Lower Body Bathing: Total assistance, +2 for physical assistance, Sit to/from stand Upper Body Dressing : Supervision/safety, Sitting Lower Body Dressing: +2 for physical assistance, Total assistance, Sit to/from stand Toilet Transfer: +2 for physical assistance, Minimal assistance, Stand-pivot, BSC Toileting- Clothing Manipulation and Hygiene: Supervision/safety, Sitting/lateral lean Functional mobility during ADLs: +2 for physical assistance, Minimal assistance (unable to ambulate, transferred only) General ADL Comments: Pt has an arthritic R knee, typically bends forward seated on a low bench to donn and doff her socks.    Cognition: Cognition Overall Cognitive Status: History of cognitive impairments - at baseline Orientation Level: Oriented to person, Other (comment) (birthdate) Cognition Arousal/Alertness: Awake/alert Behavior During Therapy: Impulsive Overall Cognitive Status: History of cognitive impairments - at baseline  Blood pressure 141/63, pulse 84, temperature 98.3 F (36.8 C), temperature source Oral, resp.  rate 16, SpO2 93 %. Physical Exam  Constitutional:  80 year old female extremely hard of hearing.  HENT:  Head: Normocephalic.  Eyes: EOM are normal.  Neck: Normal range of motion. Neck supple. No thyromegaly present.  Cardiovascular:  Cardiac rate control  Respiratory: Effort normal and breath sounds normal. No respiratory distress.  GI: Soft. Bowel sounds are normal. She exhibits no distension.  Musculoskeletal:  Right hip tender to palpation  Neurological: She is alert.  Patient makes good eye contact with examiner. She is extremely hard of hearing which did limit the exam. She is able to provide her name and age. Speech is dysarthric. Follows simple demonstrated commands. Processing is delayed.   Skin:  Hip surgical site clean and dry appropriately tender    Results for orders placed or performed during the hospital encounter of 06/12/14 (from the past 24 hour(s))  Prepare RBC     Status: None   Collection Time: 06/14/14 11:05 AM  Result Value Ref Range   Order Confirmation ORDER PROCESSED BY BLOOD BANK    No results found.  Assessment/Plan: Diagnosis: right subtrochanteric hip fracture 1. Does the need for close, 24 hr/day medical supervision in concert with the patient's rehab needs make it unreasonable for this patient to be served in a less intensive setting? Yes 2. Co-Morbidities requiring supervision/potential complications: hx of left MCA infarct 3. Due to bladder management, bowel management, safety, skin/wound care, disease management, medication administration, pain management and patient education, does the patient require 24 hr/day rehab nursing? Yes 4. Does the patient require coordinated care of a physician, rehab nurse, PT (1-2 hrs/day, 5 days/week) and OT (1-2 hrs/day, 5 days/week) to address physical and  functional deficits in the context of the above medical diagnosis(es)? Yes Addressing deficits in the following areas: balance, endurance, locomotion, strength,  transferring, bowel/bladder control, bathing, dressing, feeding, grooming, toileting and psychosocial support 5. Can the patient actively participate in an intensive therapy program of at least 3 hrs of therapy per day at least 5 days per week? Yes 6. The potential for patient to make measurable gains while on inpatient rehab is excellent 7. Anticipated functional outcomes upon discharge from inpatient rehab are modified independent and supervision  with PT, modified independent and supervision with OT, n/a with SLP. 8. Estimated rehab length of stay to reach the above functional goals is: 9-13 days 9. Does the patient have adequate social supports and living environment to accommodate these discharge functional goals? Yes 10. Anticipated D/C setting: Home 11. Anticipated post D/C treatments: HH therapy and Outpatient therapy 12. Overall Rehab/Functional Prognosis: excellent  RECOMMENDATIONS: This patient's condition is appropriate for continued rehabilitative care in the following setting: CIR Patient has agreed to participate in recommended program. Yes Note that insurance prior authorization may be required for reimbursement for recommended care.  Comment: Rehab Admissions Coordinator to follow up.  Thanks,  Meredith Staggers, MD, Mellody Drown     06/15/2014

## 2014-06-15 NOTE — Progress Notes (Addendum)
[facility] PROGRESS NOTE  [name] [id number] DOB: [birth year] DOA: 06/12/2014 PCP: Alonza Bogus, MD  Please note that patient has expressive aphasia and apraxia Please take your time to clinically assess patient , assess her pain level Husband really concerned about this  Thanks    Assessment/Plan: INTRAMEDULLARY (IM) NAIL INTERTROCHANTRIC RIGHT HIP  Right hip fracture, -Appropriate tenderness/swelling present. -PT/OT recommend CIR vs SNF Awaiting insurance approval for CIR placement Up with therapy touchdown weightbearing on right   Acute renal failure -Most likely secondary to patient's UTI and recent surgery -Patient's baseline Cr= 1.1-1.3 Creatinine 1.12, improving KVO  IV fluids  Hold lisinopril, HCTZ  UTI -Continue ceftriaxone for full 5 day treatment Mild hematuria due to UTI   Acute blood loss Anemia -1/10 transfuse 2 units PRBC, repeat CBC shows hemoglobin of 9.8 up from 7.4   Expressive aphasia/apraxia  -Per husband at baseline from previous CVA Placed on xarelto for DVT prophylaxis, holding xarelto tonight due to hematuria ,if it resolves resume in  am Switch back to Plavix after discontinuation of xarelto  Code Status: Full Family Communication: Husband present Disposition Plan: CIR vs SNF   Consultants: Dr. Dorna Leitz (orthopedic surgeon)   Procedures: 1/8 hip/pelvis x-ray; Rt Comminuted right intertrochanteric fracture without dislocation. 1/9 right hip Open reduction and internal fixation of subtrochanteric with questionable basicervical component with greater trochanteric entry rod with a lag and derotational screw proximally and a locking screw distally. 1/10 transfuse 2 units PRBCs   Cultures 1/8 urine pending   Antibiotics: Cefazolin 1/8>> stopped 1/9 Ceftriaxone 1/8>> stopped 1/8 Ceftriaxone 1/9>>   HPI/Subjective: [name] is a 80 y.o. WF PMHx severe right carotid stenosis, CVA with residual  right-sided weakness and dysarthria, HTN, HLD, nephrolithiasis. Sent from [facility] to ED due to hip fracture. Patient was at [facility] this morning having x-rays of her right knee performed when she fell while attempting to get on the x-ray table. No head injury or LOC. Patient was assisted back to x-ray table and had x-ray performed of right hip which showed hip fracture. Patient thus sent to the ED for further evaluation. patient is hard of hearing and has slurred speech, but able to provide much history. Husband states she is at her baseline mental status.  Initial work up in the Ed showed she also have uti, she received a dose of rocephin.  She is on plavix, no other anti-coagulation.   1/11  husband frustrated with lack of accurate communication between pt and provider ,  Per husband normally able to ambulate, A/O 4, does have the residual expressive aphasia/apraxia. Comfortable , no cp, no sob    Objective: Filed Vitals:   06/14/14 1845 06/14/14 2000 06/14/14 2329 06/15/14 0556  BP: 122/58 140/47 121/45 141/63  Pulse: 111 112 83 84  Temp: 98.5 F (36.9 C) 98.5 F (36.9 C) 99.7 F (37.6 C) 98.3 F (36.8 C)  TempSrc: Oral Oral Oral Oral  Resp: 18 18 18 16   SpO2: 97% 97% 97% 93%    Intake/Output Summary (Last 24 hours) at 06/15/14 1057 Last data filed at 06/14/14 2210  Gross per 24 hour  Intake    910 ml  Output      0 ml  Net    910 ml   There were no vitals filed for this visit.   Exam: General: A/O 4 (was able to obtain answers with husband's help), NAD, No acute respiratory distress Lungs: Clear to auscultation bilaterally without wheezes or  crackles Cardiovascular: Regular rate and rhythm without murmur gallop or rub normal S1 and S2 Abdomen: Nontender, nondistended, soft, bowel sounds positive, no rebound, no ascites, no appreciable mass Extremities: No significant cyanosis, clubbing, or edema left lower extremities; right lower  extremity 1+ pedal edema which is expected with her recent surgery. Right hip appropriately swollen, 2 incision sites covered and clean, negative sign of infection. Patient able to wiggle toes of right foot.   Data Reviewed: Basic Metabolic Panel:  Recent Labs Lab 06/12/14 1051 06/13/14 0452 06/14/14 0440 06/15/14 0619  NA 141 141 142 141  K 3.5 4.2 3.6 3.7  CL 110 106 108 111  CO2 26 28 27 25   GLUCOSE 115* 124* 154* 121*  BUN 22 23 25* 19  CREATININE 1.33* 1.57* 1.56* 1.12*  CALCIUM 9.0 7.8* 7.9* 7.9*   Liver Function Tests: No results for input(s): AST, ALT, ALKPHOS, BILITOT, PROT, ALBUMIN in the last 168 hours. No results for input(s): LIPASE, AMYLASE in the last 168 hours. No results for input(s): AMMONIA in the last 168 hours. CBC:  Recent Labs Lab 06/12/14 1051 06/13/14 0452 06/14/14 0440 06/15/14 0619  WBC 10.8* 11.1* 8.0 8.8  NEUTROABS 8.4*  --   --   --   HGB 12.3 8.8* 7.4* 9.8*  HCT 39.2 27.9* 23.7* 30.3*  MCV 81.7 84.0 83.2 85.1  PLT 309 288 247 203   Cardiac Enzymes: No results for input(s): CKTOTAL, CKMB, CKMBINDEX, TROPONINI in the last 168 hours. BNP (last 3 results) No results for input(s): PROBNP in the last 8760 hours. CBG: No results for input(s): GLUCAP in the last 168 hours.  Recent Results (from the past 240 hour(s))  Urine culture     Status: None (Preliminary result)   Collection Time: 06/12/14 11:37 AM  Result Value Ref Range Status   Specimen Description URINE, CATHETERIZED  Final   Special Requests ADDED [id number]  Final   Colony Count   Final    >=100,000 COLONIES/ML Performed at [facility] Performed at [facility]    Report Status PENDING  Incomplete     Studies: No results found.  Scheduled Meds: . cefTRIAXone (ROCEPHIN)  IV  1 g Intravenous Q24H  . docusate sodium  100 mg Oral BID  . ferrous sulfate  325 mg Oral BID WC  . morphine  2 mg Intravenous Once   . multivitamin with minerals  1 tablet Oral Daily  . rivaroxaban  10 mg Oral Daily  . rosuvastatin  20 mg Oral Daily  . senna  1 tablet Oral Daily   Continuous Infusions: . sodium chloride 75 mL/hr at 06/14/14 1223  . dextrose 5 % and 0.45% NaCl      Active Problems:   Closed right hip fracture   Hip fracture   UTI (urinary tract infection)   Fall   UTI (lower urinary tract infection)   Expressive aphasia   Acute blood loss anemia   Acute renal failure syndrome    Time spent: 45 minute    [facility] Pager [phone]. If 7PM-7AM, please contact night-coverage at [email or web address], password [facility] 06/15/2014, 10:57 AM  LOS: 3 days

## 2014-06-15 NOTE — Progress Notes (Signed)
Rehab admissions - Evaluated for possible admission.  I met with patient and her husband.  They are hopeful for acute inpatient rehab admission.  I have called and sent information to [employer] insurance case Freight forwarder.  We should hear back from their medical directory late this afternoon or in the morning.  Call me for questions.  #[phone]

## 2014-06-15 NOTE — Progress Notes (Signed)
Physical Therapy Treatment Patient Details Name: [name] MRN: [id number] DOB: January 03, 1935 Today's Date: 06/15/2014    History of Present Illness Patient was at [facility] the morning of 06/12/14 having x-rays of her right knee performed when she fell while attempting to get on the x-ray table. No head injury or LOC. x-ray showed  right hip fracture. Patient with PMH of CVA and some right sided weakness.    PT Comments    Pt con't to have increased R hip pain. Pt mobility greatly limited by inability to maintain R LE TTWB and history of cognitive deficits from previous CVA, limiting ability to comprehend. Pt was able to participate and complete LE exercises. Pt also requested to brush teeth, which was completed in chair. Pt currently requiring 2 person assist for all transfers. Pt to benefit from CIR upon d/c to maximize functional indep for safe transition home.   Follow Up Recommendations  CIR (SNF if unable to go to CIR)     Equipment Recommendations  None recommended by PT    Recommendations for Other Services Rehab consult     Precautions / Restrictions Precautions Precautions: Fall Restrictions Weight Bearing Restrictions: Yes RLE Weight Bearing: Touchdown weight bearing    Mobility  Bed Mobility               General bed mobility comments: pt up in chair  Transfers Overall transfer level: Needs assistance Equipment used: 2 person hand held assist Transfers: Sit to/from Stand Sit to Stand: +2 physical assistance;Min assist         General transfer comment: max directional verbal and tactile cues for technique and to maintain R LE TTWB  Ambulation/Gait Ambulation/Gait assistance: Mod assist;+2 physical assistance Ambulation Distance (Feet): 2 Feet Assistive device: Rolling walker (2 wheeled) Gait Pattern/deviations: Step-to pattern     General Gait Details: pt unable to maintain R LE TTWB, pt putting approximately 25% of her weight  through R LE. pt unable to hop on L LE   Stairs            Wheelchair Mobility    Modified Rankin (Stroke Patients Only)       Balance           Standing balance support: Bilateral upper extremity supported Standing balance-Leahy Scale: Poor                      Cognition Arousal/Alertness: Awake/alert Behavior During Therapy: WFL for tasks assessed/performed Overall Cognitive Status: History of cognitive impairments - at baseline       Memory: Decreased short-term memory              Exercises Total Joint Exercises Ankle Circles/Pumps: AROM;10 reps;Both Quad Sets: AROM;Right;10 reps Gluteal Sets: AROM;Both;10 reps Heel Slides: AROM;Right;10 reps Long Arc Quad: AAROM;Right;15 reps;Seated    General Comments        Pertinent Vitals/Pain Pain Assessment: Faces Faces Pain Scale: Hurts even more (during amb/exercises) Pain Location: R hip Pain Descriptors / Indicators: Grimacing Pain Intervention(s): Limited activity within patient's tolerance    Home Living                      Prior Function            PT Goals (current goals can now be found in the care plan section) Progress towards PT goals: Progressing toward goals    Frequency  Min 4X/week    PT Plan Current plan remains appropriate  Co-evaluation             End of Session Equipment Utilized During Treatment: Gait belt Activity Tolerance: Patient limited by pain Patient left: in chair;with call bell/phone within reach;with family/visitor present     Time: [id number] PT Time Calculation (min) (ACUTE ONLY): 24 min  Charges:  $Gait Training: 8-22 mins $Therapeutic Exercise: 8-22 mins                    G Codes:      Kingsley Callander 06/15/2014, 1:38 PM   Kittie Plater, PT, DPT Pager #: [phone] Office #: [phone]

## 2014-06-16 LAB — URINE CULTURE: Colony Count: >=100000

## 2014-06-16 LAB — CBC
HCT: 29.2 % — ABNORMAL LOW (ref 36.0–46.0)
Hemoglobin: 9.3 g/dL — ABNORMAL LOW (ref 12.0–15.0)
MCH: 26.5 pg (ref 26.0–34.0)
MCHC: 31.8 g/dL (ref 30.0–36.0)
MCV: 83.2 fL (ref 78.0–100.0)
PLATELETS: 253 10*3/uL (ref 150–400)
RBC: 3.51 MIL/uL — AB (ref 3.87–5.11)
RDW: 15 % (ref 11.5–15.5)
WBC: 8.9 10*3/uL (ref 4.0–10.5)

## 2014-06-16 MED ORDER — RIVAROXABAN 10 MG PO TABS: 10.0000 mg | ORAL_TABLET | Freq: Every day | ORAL | Status: DC

## 2014-06-16 MED ORDER — CIPROFLOXACIN HCL 500 MG PO TABS: 500.0000 mg | ORAL_TABLET | Freq: Two times a day (BID) | ORAL | Status: DC

## 2014-06-16 MED ORDER — CLOPIDOGREL BISULFATE 75 MG PO TABS: 75.0000 mg | ORAL_TABLET | Freq: Every day | ORAL | Status: DC

## 2014-06-16 MED ORDER — DSS 100 MG PO CAPS: 100.0000 mg | ORAL_CAPSULE | Freq: Two times a day (BID) | ORAL | Status: DC

## 2014-06-16 MED ORDER — CEPHALEXIN 500 MG PO CAPS: 500.0000 mg | ORAL_CAPSULE | Freq: Two times a day (BID) | ORAL | Status: AC

## 2014-06-16 MED ORDER — FERROUS SULFATE 325 (65 FE) MG PO TABS: 325.0000 mg | ORAL_TABLET | Freq: Two times a day (BID) | ORAL | Status: DC

## 2014-06-16 MED ORDER — POLYETHYLENE GLYCOL 3350 17 G PO PACK: 17.0000 g | PACK | Freq: Every day | ORAL | Status: DC

## 2014-06-16 MED ORDER — HYDROCODONE-ACETAMINOPHEN 5-325 MG PO TABS: 1.0000 | ORAL_TABLET | Freq: Four times a day (QID) | ORAL | Status: DC | PRN

## 2014-06-16 NOTE — Clinical Social Work Note (Addendum)
UPDATE Facility: [facility] Report number: [id number] Transportation: EMS ([facility]) scheduled for 2:30pm at facility request.  [employer] authorization: [id number]  12:15pm CSW received call from CIR admissions liaison stating pt's insurance has denied CIR placement.  CSW spoke with pt's husband regarding SNF placement.  Pt's husband has [facility].  [facility] updated regarding information above.  [facility] able to admit pt today (06/16/2014), MD aware.  Lubertha Sayres, [place] ([id number]) Licensed Clinical Social Worker Orthopedics [phone]) and Surgical [phone])

## 2014-06-16 NOTE — Progress Notes (Signed)
OT Cancellation Note  Patient Details Name: [name] MRN: [id number] DOB: 1935-03-07   Cancelled Treatment:    Reason Eval/Treat Not Completed: Other (comment), pt. And pts. Spouse request therapy return later as pt. Was trying to have  A BM and needed privacy.  Will see if another therapist is able to see pt. Later today.    Raylan, [place], COTA/L 06/16/2014, 9:26 AM

## 2014-06-16 NOTE — Discharge Summary (Addendum)
Physician Discharge Summary  [name] MRN: [id number] DOB/AGE: 06-06-1934 80 y.o.  PCP: Alonza Bogus, MD   Admit date: 06/12/2014 Discharge date: 06/16/2014   Discharge Diagnoses:     Active Problems:   Closed right hip fracture   Hip fracture   UTI (urinary tract infection)   Fall   UTI (lower urinary tract infection)   Expressive aphasia   Acute blood loss anemia   Acute renal failure syndrome INTRAMEDULLARY (IM) NAIL INTERTROCHANTRIC RIGHT HIP (Right)   Follow-up recommendations CBC, BMP in 3-4 days Follow-up with PCP in 5-7 days Follow-up with orthopedics, [facility] in 2-3 weeks     Medication List    STOP taking these medications        naproxen sodium 220 MG tablet  Commonly known as:  ANAPROX      TAKE these medications        CENTRUM ULTRA WOMENS Tabs  Take 1 tablet by mouth daily.     clopidogrel 75 MG tablet  Commonly known as:  PLAVIX  Take 1 tablet (75 mg total) by mouth daily.  Start taking on:  07/13/2014     DSS 100 MG Caps  Take 100 mg by mouth 2 (two) times daily.     ferrous sulfate 325 (65 FE) MG tablet  Take 1 tablet (325 mg total) by mouth 2 (two) times daily with a meal.     lisinopril-hydrochlorothiazide 20-12.5 MG per tablet  Commonly known as:  PRINZIDE,ZESTORETIC  Take 1 tablet by mouth daily.     polyethylene glycol packet  Commonly known as:  MIRALAX / GLYCOLAX  Take 17 g by mouth daily.     rivaroxaban 10 MG Tabs tablet  Commonly known as:  XARELTO  Take 1 tablet (10 mg total) by mouth daily.  Start taking on:  06/17/2014     rosuvastatin 20 MG tablet  Commonly known as:  CRESTOR  Take 20 mg by mouth daily.     senna 8.6 MG tablet  Commonly known as:  SENOKOT  Take 1 tablet by mouth daily.        Discharge Condition: SNF   Disposition: SNF   Consults: Orthopedics  Significant Diagnostic Studies: Dg Chest 1 View  06/12/2014   CLINICAL DATA:  [name], hip fracture  EXAM: CHEST - 1 VIEW   COMPARISON:  05/13/2011  FINDINGS: Moderate cardiomegaly. Mild bilateral interstitial edema or infiltrates, slightly increased since prior study. Atheromatous ectatic aorta. No effusion.  No pneumothorax. Mild spondylitic changes in the lower thoracic spine.  IMPRESSION: 1. Worsening cardiomegaly and bilateral interstitial edema/infiltrates.   Electronically Signed   By: Arne Cleveland M.D.   On: 06/12/2014 13:33   Dg C-arm 61-120 Min  06/12/2014   CLINICAL DATA:  Right femur fracture, fixation  EXAM: DG C-ARM 61-120 MIN; DG FEMUR 2+V*R*  : COMPARISON:  06/12/2014  FINDINGS: Intra medullary rod has been placed. There is now near anatomic position and alignment of intertrochanteric right femur fracture.  IMPRESSION: ORIF right femur fracture   Electronically Signed   By: Skipper Cliche M.D.   On: 06/12/2014 20:40   Dg Hip Unilat With Pelvis 2-3 Views Right  06/12/2014   CLINICAL DATA:  Status post fall  EXAM: DG HIP W/ PELVIS 2-3V*R*  COMPARISON:  None.  FINDINGS: There is a comminuted, displaced and mildly angulated right intertrochanteric fracture. There is no hip dislocation. There is a bone island in the left femoral neck. There is generalized osteopenia.  IMPRESSION: Comminuted right intertrochanteric fracture without dislocation.   Electronically Signed   By: Kathreen Devoid   On: 06/12/2014 13:40   Dg Femur, Min 2 Views Right  06/12/2014   CLINICAL DATA:  Right femur fracture, fixation  EXAM: DG C-ARM 61-120 MIN; DG FEMUR 2+V*R*  : COMPARISON:  06/12/2014  FINDINGS: Intra medullary rod has been placed. There is now near anatomic position and alignment of intertrochanteric right femur fracture.  IMPRESSION: ORIF right femur fracture   Electronically Signed   By: Skipper Cliche M.D.   On: 06/12/2014 20:40      Microbiology: Recent Results (from the past 240 hour(s))  Urine culture     Status: None   Collection Time: 06/12/14 11:37 AM  Result Value Ref Range Status   Specimen Description URINE,  CATHETERIZED  Final   Special Requests ADDED [id number]  Final   Colony Count   Final    >=100,000 COLONIES/ML Performed at [facility]    Culture   Final    CITROBACTER KOSERI Performed at [facility]    Report Status 06/16/2014 FINAL  Final   Organism ID, Bacteria CITROBACTER KOSERI  Final      Susceptibility   Citrobacter koseri - MIC*    CEFAZOLIN <=4 SENSITIVE Sensitive     CEFTRIAXONE <=1 SENSITIVE Sensitive     CIPROFLOXACIN <=0.25 SENSITIVE Sensitive     GENTAMICIN <=1 SENSITIVE Sensitive     LEVOFLOXACIN <=0.12 SENSITIVE Sensitive     NITROFURANTOIN <=16 SENSITIVE Sensitive     TOBRAMYCIN <=1 SENSITIVE Sensitive     TRIMETH/SULFA <=20 SENSITIVE Sensitive     PIP/TAZO <=4 SENSITIVE Sensitive     * CITROBACTER KOSERI     Labs: Results for orders placed or performed during the hospital encounter of 06/12/14 (from the past 48 hour(s))  CBC     Status: Abnormal   Collection Time: 06/15/14  6:19 AM  Result Value Ref Range   WBC 8.8 4.0 - 10.5 K/uL   RBC 3.56 (L) 3.87 - 5.11 MIL/uL   Hemoglobin 9.8 (L) 12.0 - 15.0 g/dL    Comment: REPEATED TO VERIFY POST TRANSFUSION SPECIMEN    HCT 30.3 (L) 36.0 - 46.0 %   MCV 85.1 78.0 - 100.0 fL   MCH 27.5 26.0 - 34.0 pg   MCHC 32.3 30.0 - 36.0 g/dL   RDW 14.7 11.5 - 15.5 %   Platelets 203 150 - 400 K/uL  Basic metabolic panel     Status: Abnormal   Collection Time: 06/15/14  6:19 AM  Result Value Ref Range   Sodium 141 135 - 145 mmol/L    Comment: Please note change in reference range.   Potassium 3.7 3.5 - 5.1 mmol/L    Comment: Please note change in reference range.   Chloride 111 96 - 112 mEq/L   CO2 25 19 - 32 mmol/L   Glucose, Bld 121 (H) 70 - 99 mg/dL   BUN 19 6 - 23 mg/dL   Creatinine, Ser 1.12 (H) 0.50 - 1.10 mg/dL   Calcium 7.9 (L) 8.4 - 10.5 mg/dL   GFR calc non Af Amer 46 (L) >90 mL/min   GFR calc Af Amer 53 (L) >90 mL/min    Comment: (NOTE) The eGFR has been calculated using the CKD EPI  equation. This calculation has not been validated in all clinical situations. eGFR's persistently <90 mL/min signify possible Chronic Kidney Disease.    Anion gap 5 5 - 15  CBC     Status: Abnormal   Collection Time: 06/16/14  6:05 AM  Result Value Ref Range   WBC 8.9 4.0 - 10.5 K/uL   RBC 3.51 (L) 3.87 - 5.11 MIL/uL   Hemoglobin 9.3 (L) 12.0 - 15.0 g/dL   HCT 29.2 (L) 36.0 - 46.0 %   MCV 83.2 78.0 - 100.0 fL   MCH 26.5 26.0 - 34.0 pg   MCHC 31.8 30.0 - 36.0 g/dL   RDW 15.0 11.5 - 15.5 %   Platelets 253 150 - 400 K/uL     HPI :HPI/Subjective: [name] is a 80 y.o. 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 Sent from [place] orth[facility]ics to ED due to hip fracture. Patient was at [facility] this morning having x-rays of her right knee performed when she fell while attempting to get on the x-ray table. No head injury or LOC. Patient was assisted back to x-ray table and had x-ray performed of right hip which showed hip fracture. Patient thus sent to the ED for further evaluation. patient is hard of hearing and has slurred speech, but able to provide much history. Husband states she is at her baseline mental status.  Initial work up in the Ed showed she also have uti, she received a dose of rocephin.  She is on plavix, no other anti-coagulation.    HOSPITAL COURSE:  Assessment/Plan: INTRAMEDULLARY (IM) NAIL INTERTROCHANTRIC RIGHT HIP  Right hip fracture, -Appropriate tenderness/swelling present. -PT/OT recommend SNF, CIR was declined by insurance Placed on xarelto 30 days, patient can go back to Plavix once xarelto is discontinued Up with therapy touchdown weightbearing on right Tylenol vs Vicodin for pain control  Acute renal failure -Most likely secondary to patient's UTI and recent surgery -Patient's baseline Cr= 1.1-1.3 Creatinine 1.12, improving KVO IV fluids  Hold  lisinopril, HCTZ  UTI Started on treatment with ceftriaxone , being switched to ciprofloxacin for another 7 days Mild hematuria due to UTI  xarelto was held for 2 doses pending resolution of hematuria Can resume xarelto on 1/13   Acute blood loss Anemia -1/10 transfuse 2 units PRBC, repeat CBC shows hemoglobin of 9.8 up from 7.4 Hemoglobin 9.3 prior to discharge Repeat CBC in 3-4 days   Expressive aphasia/apraxia  -Per husband at baseline from previous CVA Placed on xarelto for DVT prophylaxis,  Resume Plavix on 2/8  and discontinue  xarelto after a total of 30 days Switch back to Plavix after discontinuation of xarelto     Discharge Exam:    Blood pressure 147/61, pulse 90, temperature 98.7 F (37.1 C), temperature source Oral, resp. rate 18, SpO2 96 %.  General: A/O 4 (was able to obtain answers with husband's help), NAD, No acute respiratory distress Lungs: Clear to auscultation bilaterally without wheezes or crackles Cardiovascular: Regular rate and rhythm without murmur gallop or rub normal S1 and S2 Abdomen: Nontender, nondistended, soft, bowel sounds positive, no rebound, no ascites, no appreciable mass Extremities: No significant cyanosis, clubbing, or edema left lower extremities; right lower extremity 1+ pedal edema which is expected with her recent surgery. Right hip appropriately swollen, 2 incision sites covered and clean, negative sign of infection. Patient able to wiggle toes of right foot.        Discharge Instructions    Diet - low sodium heart healthy    Complete by:  As directed      Increase activity slowly    Complete by:  As directed      Touch down weight bearing    Complete by:  As directed   Laterality:  right  Extremity:  Lower           Follow-up Information    Follow up with GRAVES,JOHN L, MD. Schedule an appointment as soon as possible for a visit in 2 weeks.   Specialty:  Orthopedic Surgery   Contact information:   [place] [id number] [phone]       Signed: Reyne Dumas 06/16/2014, 12:12 PM

## 2014-06-16 NOTE — Progress Notes (Signed)
Physical Therapy Treatment Patient Details Name: [name] MRN: [id number] DOB: [birth year] Today's Date: 06/16/2014    History of Present Illness Patient was at [facility] the morning of 06/12/14 having x-rays of her right knee performed when she fell while attempting to get on the x-ray table. No head injury or LOC. x-ray showed  right hip fracture. Patient with PMH of CVA and some right sided weakness.  Pt underwent R femur IM nailing on 06/12/14.    PT Comments    Pt is progressing well with her mobility, but is unable to either physically preform or cognitively comprehend TDWB status, so despite the potential to progress mobility and gait we deferred gait progression due to safety with maintaining TDWB on her right leg.   She is doing well with exercises and bed mobility as well as regular RW use for stand pivot.  PT will continue to follow acutely and continues to recommend post acute rehab at discharge.   Follow Up Recommendations  SNF     Equipment Recommendations  None recommended by PT    Recommendations for Other Services   NA     Precautions / Restrictions Precautions Precautions: Fall Precaution Comments: h/o falls Restrictions RLE Weight Bearing: Touchdown weight bearing    Mobility  Bed Mobility Overal bed mobility: Needs Assistance;+2 for physical assistance Bed Mobility: Sit to Supine       Sit to supine: +2 for physical assistance;Min assist   General bed mobility comments: Two person min assist, one to help with control of trunk to supine and one to assist with raising right leg into bed.   Transfers Overall transfer level: Needs assistance Equipment used: Rolling walker (2 wheeled) Transfers: Sit to/from [facility] Sit to Stand: +2 physical assistance;Min assist Stand pivot transfers: +2 physical assistance;Min assist       General transfer comment: Two person min assist to help pt power up to standing with RW and  pivot to bed from recliner chair. Max verbal cues for safe hand placement and TDWB status of right foot.  Manual assist needed for balance and to steer RW while turning to the bed.   Ambulation/Gait             General Gait Details: Pt having difficulty with TDWB status, so despite the fact that pt could likely progress gait, she cannot maintain proper WB, so gait was not progressed at this time.           Balance Overall balance assessment: Needs assistance Sitting-balance support: No upper extremity supported;Feet supported Sitting balance-Leahy Scale: Good     Standing balance support: Bilateral upper extremity supported Standing balance-Leahy Scale: Poor                      Cognition Arousal/Alertness: Awake/alert Behavior During Therapy: WFL for tasks assessed/performed Overall Cognitive Status: History of cognitive impairments - at baseline       Memory: Decreased short-term memory              Exercises Total Joint Exercises Ankle Circles/Pumps: AROM;20 reps;Supine Quad Sets: AROM;10 reps;Supine (pt had difficulty understanding this exercise) Short Arc Quad: AAROM;Right;10 reps;Supine Heel Slides: AAROM;Right;10 reps;Supine Hip ABduction/ADduction: AAROM;Right;10 reps;Supine Long Arc Quad: AAROM;Right;10 reps;Seated        Pertinent Vitals/Pain Pain Assessment: Faces Faces Pain Scale: Hurts a little bit Pain Location: right leg Pain Descriptors / Indicators: Guarding;Grimacing Pain Intervention(s): Limited activity within patient's tolerance;Monitored during session;Repositioned  PT Goals (current goals can now be found in the care plan section) Acute Rehab PT Goals Patient Stated Goal: to go home Progress towards PT goals: Progressing toward goals    Frequency  Min 3X/week (based on departmental protocols)    PT Plan Frequency needs to be updated       End of Session Equipment Utilized During Treatment: Gait  belt Activity Tolerance: Patient tolerated treatment well Patient left: in bed;with call bell/phone within reach;with family/visitor present (friend (female) in room at end of session)     Time: [id number] PT Time Calculation (min) (ACUTE ONLY): 14 min  Charges:  $Therapeutic Activity: 8-22 mins                     Kennia Vanvorst B. Ismael Treptow, PT, DPT [phone]   06/16/2014, 3:31 PM

## 2014-06-16 NOTE — Progress Notes (Signed)
Subjective: 4 Days Post-Op Procedure(s) (LRB): INTRAMEDULLARY (IM) NAIL INTERTROCHANTRIC RIGHT HIP (Right) Patient reports pain as mild.    Objective: Vital signs in last 24 hours: Temp:  [98.5 F (36.9 C)-98.7 F (37.1 C)] 98.7 F (37.1 C) (01/12 0627) Pulse Rate:  [90-107] 90 (01/12 0627) Resp:  [16-18] 18 (01/11 2000) BP: (147-152)/(53-61) 147/61 mmHg (01/12 0627) SpO2:  [93 %-97 %] 96 % (01/12 0627)  Intake/Output from previous day: 01/11 0701 - 01/12 0700 In: 480 [P.O.:480] Out: -  Intake/Output this shift:     Recent Labs  06/14/14 0440 06/15/14 0619 06/16/14 0605  HGB 7.4* 9.8* 9.3*    Recent Labs  06/15/14 0619 06/16/14 0605  WBC 8.8 8.9  RBC 3.56* 3.51*  HCT 30.3* 29.2*  PLT 203 253    Recent Labs  06/14/14 0440 06/15/14 0619  NA 142 141  K 3.6 3.7  CL 108 111  CO2 27 25  BUN 25* 19  CREATININE 1.56* 1.12*  GLUCOSE 154* 121*  CALCIUM 7.9* 7.9*   No results for input(s): LABPT, INR in the last 72 hours.  Neurologically intact ABD soft Neurovascular intact Sensation intact distally No cellulitis present Compartment soft  Assessment/Plan: 4 Days Post-Op Procedure(s) (LRB): INTRAMEDULLARY (IM) NAIL INTERTROCHANTRIC RIGHT HIP (Right) Advance diet Up with therapy D/C IV fluids  Try for IP rehab today.  If not will need SNF  Cayde Held L 06/16/2014, 8:36 AM

## 2014-06-16 NOTE — Progress Notes (Signed)
Rehab admissions - i have received a denial for acute inpatient rehab from insurance carrier.  Patient will likely need SNF.  I will discuss denial with husband this am.  Call me for questions.  [id number]

## 2014-06-16 NOTE — Progress Notes (Addendum)
Rehab admissions - I spoke with husband.  He wants to appeal the denial.  I have asked Dr. Naaman Plummer to do a peer-to-peer appeal.  I will follow up if we get a decision on the peer to peer later today.  Call me for questions.  [phone]  Dr. Naaman Plummer did a peer to peer with the medical director and has received a second denial.  This denial is based on lack of medical necessity.  I have shared this with [name].  Husband now ready to consider SNF placement and I have called social worker to let her know.  Call me for questions.  [phone]

## 2014-06-17 NOTE — Progress Notes (Signed)
CARE MANAGEMENT NOTE 06/17/2014  Patient:  [name], [name]   Account Number:  [id number]  Date Initiated:  06/13/2014  Documentation initiated by:  [facility]  Subjective/Objective Assessment:   rt hip fx, s/p ORIF rt hip     Action/Plan:   PT/OT evals- inpatient rehab referral  inpatient rehab denied by insurance  going to SNF   Anticipated DC Date:  06/16/2014   Anticipated DC Plan:  [place]  In-house referral  Clinical Social Worker      DC Planning Services  CM consult      Choice offered to / List presented to:             Status of service:  Completed, signed off Medicare Important Message given?   (If response is "NO", the following Medicare IM given date fields will be blank) Date Medicare IM given:   Medicare IM given by:   Date Additional Medicare IM given:   Additional Medicare IM given by:    Discharge Disposition:  [place]  Per UR Regulation:  Reviewed for med. necessity/level of care/duration of stay

## 2014-08-20 ENCOUNTER — Ambulatory Visit (HOSPITAL_COMMUNITY): Payer: PPO | Attending: Orthopaedic Surgery | Admitting: Physical Therapy

## 2014-08-20 DIAGNOSIS — M25661 Stiffness of right knee, not elsewhere classified: Secondary | ICD-10-CM | POA: Insufficient documentation

## 2014-08-20 DIAGNOSIS — M25561 Pain in right knee: Secondary | ICD-10-CM | POA: Insufficient documentation

## 2014-08-20 DIAGNOSIS — M25651 Stiffness of right hip, not elsewhere classified: Secondary | ICD-10-CM

## 2014-08-20 DIAGNOSIS — R262 Difficulty in walking, not elsewhere classified: Secondary | ICD-10-CM | POA: Insufficient documentation

## 2014-08-20 NOTE — Therapy (Addendum)
[place], [place], [address] Phone: [phone]   Fax:  [phone]  Physical Therapy Evaluation  Patient Details  Name: [name] MRN: [id number] Date of Birth: 1935-04-16 Referring Provider:  Melrose Nakayama, MD  Encounter Date: 08/20/2014      PT End of Session - 08/20/14 1407    Visit Number 1   Number of Visits 16   Date for PT Re-Evaluation 09/19/14   Authorization Type [employer] team [employer]   Authorization - Visit Number 1   Authorization - Number of Visits 16   PT Start Time 1300   PT Stop Time [id number]   PT Time Calculation (min) 47 min   Activity Tolerance Patient tolerated treatment well   Behavior During Therapy [facility] for tasks assessed/performed      Past Medical History  Diagnosis Date  . High cholesterol   . Hearing impaired person   . Hypertension   . Stroke     speech slightly , right sided weakness  . Kidney stones   . Degenerative joint disease of knee, right     right knee  . History of blood transfusion     Past Surgical History  Procedure Laterality Date  . Kidney stones    . Lithotripsy    . Peg placement      and removal  . Peg tube removal    . Tympanoplasty    . Tonsillectomy    . Appendectomy    . Radiology with anesthesia  04/22/2012    Procedure: RADIOLOGY WITH ANESTHESIA;  Surgeon: Rob Hickman, MD;  Location: [place];  Service: Radiology;  Laterality: N/A;  VERTEBRAL ARTERY STENT PLACEMENT AND CEREBRAL ANGIOGRAM  . Intramedullary (im) nail intertrochanteric Right 06/12/2014    Procedure: INTRAMEDULLARY (IM) NAIL INTERTROCHANTRIC RIGHT HIP;  Surgeon: Alta Corning, MD;  Location: [place];  Service: Orthopedics;  Laterality: Right;    There were no vitals filed for this visit.  Visit Diagnosis:  Hip stiffness, right - Plan: PT plan of care cert/re-cert  Right knee pain - Plan: PT plan of care cert/re-cert  Knee stiffness, right - Plan: PT plan of care  cert/re-cert  Difficulty walking - Plan: PT plan of care cert/re-cert      Subjective Assessment - 08/20/14 1302    Symptoms pain predeominantly in knee   Pertinent History Pt had a stroke in 2010, massive, and had phsyical therapy, following which she was walking without a cain. january 8 Rt knee pain attributed to arthritis, fror which she will recieve a TKA atsome ppoint in the furturre. Patient has been getting shots in knee for pain. Patient fell and fractured her Right hip , for which she had a ORIF (06/12/14),  Patient also has right knee pain.  Patienthas had HHPT.    Patient Stated Goals to bne able to perform sit to stand without difficulty. and ambulate without an  AD to the cain or walker without difficulty, get in and out of shower (4 inch lip)    Currently in Pain? Yes   Pain Score 2    Pain Location Knee   Pain Orientation Right   Pain Descriptors / Indicators Aching   Pain Type Chronic pain            [facility] PT Assessment - 08/20/14 0001    Assessment   Medical Diagnosis Rt hip ORIF   Onset Date 06/12/14   Next MD Visit [place], [place] of ap[place]  Prior Therapy yes HHPT   Restrictions   Other Position/Activity Restrictions Patient likely to fall/lean to right abnd visual tracking difficulties to Rt.    Balance Screen   Has the patient fallen in the past 6 months Yes   How many times? 1   Has the patient had a decrease in activity level because of a fear of falling?  No   Is the patient reluctant to leave their home because of a fear of falling?  No   Prior Function   Level of Independence Needs assistance with gait;Needs assistance with ADLs;Needs assistance with homemaking   Functional Tests   Functional tests Sit to Stand;Other;Other2   Sit to Stand   Comments 5x sit to stand 15.3    ROM / Strength   AROM / PROM / Strength AROM;Strength   AROM   AROM Assessment Site Knee;Ankle;Hip   Right/Left Hip Left;Right   Right Hip External Rotation  41   Right Hip  Internal Rotation  21   Right/Left Knee Left;Right   Right Knee Extension -4   Right Knee Flexion 115   Strength   Strength Assessment Site Knee;Ankle;Hip   Right/Left Hip Left;Right   Right Hip Flexion 4-/5   Left Hip Flexion 4+/5   Right/Left Knee Left;Right   Right Knee Flexion 4-/5   Right Knee Extension 4-/5   Left Knee Flexion 4+/5   Left Knee Extension 4+/5   Right/Left Ankle Left;Right   Right Ankle Dorsiflexion 4+/5   Left Ankle Dorsiflexion 4+/5            PT Education - 08/20/14 1347    Education provided Yes   Education Details Educated patient on diagnosis, prognosis, plan of care, and HEP    Person(s) Educated Patient;Spouse   Methods Explanation;Demonstration;Handout   Comprehension Verbalized understanding;Returned demonstration          PT Short Term Goals - 08/20/14 1433    PT SHORT TERM GOAL #1   Title Patient will demonstrate hip internal rotation bilaterally >30 degrees   Time 4   Period Weeks   Status New   PT SHORT TERM GOAL #2   Title Patient will dmeonstrate 5x sit to stand <14 secodns indicating patient not at high risk of falls due to LE strength.    Time 4   Period Weeks   Status New   PT SHORT TERM GOAL #3   Title Patient will demonstrate improved TUG time of <15seconds to be able to de,emosntrate ability to get up and go withtou increased risk of falls.     Time 4   Period Weeks   Status New   PT SHORT TERM GOAL #4   Title Patient will dmeonstrate increased hip abduction strength of 3/5 MMT   Time 4   Period Weeks   Status New           PT Long Term Goals - 08/20/14 1438    PT LONG TERM GOAL #1   Title Patient will meonstrate a BERg balance score >51/56 indicatign patient not at high risk of falls.    Time 8   Period Weeks   Status New   PT LONG TERM GOAL #2   Title Patient will demonstrate a negativeOber's test indicating improved frontal plane hip mobility.    Time 8   Period Weeks   Status New   PT LONG TERM  GOAL #3   Title Patien twill dmeonstrate bilateral glut max strength of 4+/5 MMT and increased  hip abdction strength of 4-/5 indicating improved stability.    Time 8   Period Weeks   Status New   PT LONG TERM GOAL #4   Title Patient will be able to ambulate with no assistive device 181ft.    Time 8   Period Weeks   Status New   PT LONG TERM GOAL #5   Title Patient will be independent with HEP.           Plan - 08/20/14 1410    Clinical Impression Statement patient displays Rt hip weakness and Rt knee stiffness/pain s/p Rt hip ORIF follwoing a fall. Patient displays decrerased balance secondary to decreased Rt LE strength resulting in increased risk of falls as indicated by increased 5x sit to stand time. patient will benefit from    Rehab Potential Good   PT Frequency 2x / week   PT Duration 8 weeks   PT Treatment/Interventions Gait training;Stair training;Functional mobility training;Patient/family education;Passive range of motion;Therapeutic activities;Therapeutic exercise;Manual techniques;Balance training   PT Next Visit Plan Perfore BERG balance test to assess standing balnace tolerance. Perform grade 1-2 hip internal rotation mobilizations, and perform supine and sittign clams, and calf stretch (standing), Correct hip alignment.    PT Home Exercise Plan hooklaying clams   Consulted and Agree with Plan of Care Patient;Family member/caregiver       Problem List Patient Active Problem List   Diagnosis Date Noted  . Expressive aphasia   . Acute blood loss anemia   . Acute renal failure syndrome   . Closed right hip fracture 06/12/2014  . Hip fracture 06/12/2014  . UTI (urinary tract infection) 06/12/2014  . Fall   . UTI (lower urinary tract infection)   . Vasovagal syncope 05/15/2011  . S/P stroke due to cerebrovascular disease 05/15/2011  . Hypertension 05/15/2011  . Urinary tract infection 05/15/2011  . LOWER LEG, ARTHRITIS, DEGEN./OSTEO 01/22/09  . JOINT  EFFUSION, RIGHT KNEE January 22, 2009  . KNEE PAIN January 22, 2009     G- Codes:  FOTO :Status  63%  Mobility wallking    Current Status CL - At least 60 percent but less than 80 percent    Goal Status+ CK - At least 40 percent but less than 60 percent   Devona Konig PT DPT [place], [place], [address] Phone: [phone]   Fax:  [phone]

## 2014-08-20 NOTE — Patient Instructions (Signed)
Abduction: Clam (Eccentric) - Side-Lying   Lie on side with knees bent. Lift top knee, keeping feet together. Keep trunk steady. Slowly lower for 3-5 seconds. 10 - 20reps per set, 1sets per day, __7_ days per week.  Copyright  V[employer]. All rights reserved.

## 2014-08-24 ENCOUNTER — Ambulatory Visit (HOSPITAL_COMMUNITY): Payer: PPO | Admitting: Physical Therapy

## 2014-08-24 DIAGNOSIS — M25661 Stiffness of right knee, not elsewhere classified: Secondary | ICD-10-CM

## 2014-08-24 DIAGNOSIS — M25651 Stiffness of right hip, not elsewhere classified: Secondary | ICD-10-CM

## 2014-08-24 DIAGNOSIS — R262 Difficulty in walking, not elsewhere classified: Secondary | ICD-10-CM

## 2014-08-24 DIAGNOSIS — M25561 Pain in right knee: Secondary | ICD-10-CM

## 2014-08-24 NOTE — Therapy (Signed)
[place], [place], [address] Phone: [phone]   Fax:  [phone]  Physical Therapy Treatment  Patient Details  Name: [name] MRN: [id number] Date of Birth: [birth year] Referring Provider:  Melrose Nakayama, MD  Encounter Date: 08/24/2014      PT End of Session - 08/24/14 1022    Visit Number 2   Number of Visits 16   Date for PT Re-Evaluation 09/19/14   Authorization Type [employer] team advantage   Authorization - Visit Number 2   Authorization - Number of Visits 16   PT Start Time 0935   PT Stop Time 1015   PT Time Calculation (min) 40 min   Activity Tolerance Patient tolerated treatment well   Behavior During Therapy [facility] for tasks assessed/performed      Past Medical History  Diagnosis Date  . High cholesterol   . Hearing impaired person   . Hypertension   . Stroke     speech slightly , right sided weakness  . Kidney stones   . Degenerative joint disease of knee, right     right knee  . History of blood transfusion     Past Surgical History  Procedure Laterality Date  . Kidney stones    . Lithotripsy    . Peg placement      and removal  . Peg tube removal    . Tympanoplasty    . Tonsillectomy    . Appendectomy    . Radiology with anesthesia  04/22/2012    Procedure: RADIOLOGY WITH ANESTHESIA;  Surgeon: Rob Hickman, MD;  Location: [place];  Service: Radiology;  Laterality: N/A;  VERTEBRAL ARTERY STENT PLACEMENT AND CEREBRAL ANGIOGRAM  . Intramedullary (im) nail intertrochanteric Right 06/12/2014    Procedure: INTRAMEDULLARY (IM) NAIL INTERTROCHANTRIC RIGHT HIP;  Surgeon: Alta Corning, MD;  Location: [place];  Service: Orthopedics;  Laterality: Right;    There were no vitals filed for this visit.  Visit Diagnosis:  Hip stiffness, right  Right knee pain  Knee stiffness, right  Difficulty walking      Subjective Assessment - 08/24/14 1018    Symptoms Patient very  pleasant, with only slight complaints of pain in R knee; husband present for entire treatment session and assisted in command following   Pertinent History Pt had a stroke in 2010, massive, and had phsyical therapy, following which she was walking without a cain. january 8 Rt knee pain attributed to arthritis, fror which she will recieve a TKA atsome ppoint in the furturre. Patient has been getting shots in knee for pain. Patient fell and fractured her Right hip , for which she had a ORIF (06/12/14),  Patient also has right knee pain.  Patienthas had HHPT.    Currently in Pain? Yes   Pain Score 2    Pain Location Knee   Pain Orientation Right            [facility] PT Assessment - 08/24/14 0001    Berg Balance Test   Sit to Stand Able to stand without using hands and stabilize independently   Standing Unsupported Able to stand 2 minutes with supervision   Sitting with Back Unsupported but Feet Supported on Floor or Stool Able to sit safely and securely 2 minutes   Stand to Sit Uses backs of legs against chair to control descent   Transfers Needs one person to assist   Standing Unsupported with Eyes Closed Able to stand 10  seconds with supervision   Standing Ubsupported with Feet Together Able to place feet together independently and stand for 1 minute with supervision   From Standing, Reach Forward with Outstretched Arm Can reach confidently >25 cm (10")   From Standing Position, Pick up Object from [place] to pick up shoe, needs supervision   From Standing Position, Turn to Look Behind Over each Shoulder Looks behind one side only/other side shows less weight shift   Turn 360 Degrees Able to turn 360 degrees safely but slowly   Standing Unsupported, Alternately Place Feet on Step/Stool Able to complete >2 steps/needs minimal assist   Standing Unsupported, One Foot in [employer] balance while stepping or standing   Standing on One Leg Tries to lift leg/unable to hold 3 seconds but remains  standing independently   Total Score 34                   [facility] Adult PT Treatment/Exercise - 08/24/14 0001    Knee/Hip Exercises: Standing   Other Standing Knee Exercises Sit to stand without hands 1x10   Knee/Hip Exercises: Supine   Bridges Both;1 set;15 reps   Straight Leg Raises Both;1 set;10 reps   Straight Leg Raises Limitations AAROM right leg   Knee/Hip Exercises: Sidelying   Hip ABduction Both;1 set;10 reps   Hip ABduction Limitations AAROM R leg   Clams Sidelying 1x10 each side with manual cues   Knee/Hip Exercises: Prone   Hip Extension Both;1 set;10 reps   Hip Extension Limitations Manual cues both sides; AAROM R leg                  PT Short Term Goals - 08/20/14 1433    PT SHORT TERM GOAL #1   Title Patient will demonstrate hip internal rotation bilaterally >30 degrees   Time 4   Period Weeks   Status New   PT SHORT TERM GOAL #2   Title Patient will dmeonstrate 5x sit to stand <14 secodns indicating patient not at high risk of falls due to LE strength.    Time 4   Period Weeks   Status New   PT SHORT TERM GOAL #3   Title Patient will demonstrate improved TUG time of <15seconds to be able to de,emosntrate ability to get up and go withtou increased risk of falls.     Time 4   Period Weeks   Status New   PT SHORT TERM GOAL #4   Title Patient will dmeonstrate increased hip abduction strength of 3/5 MMT   Time 4   Period Weeks   Status New           PT Long Term Goals - 08/20/14 1438    PT LONG TERM GOAL #1   Title Patient will meonstrate a BERg balance score >51/56 indicatign patient not at high risk of falls.    Time 8   Period Weeks   Status New   PT LONG TERM GOAL #2   Title Patient will demonstrate a negativeOber's test indicating improved frontal plane hip mobility.    Time 8   Period Weeks   Status New   PT LONG TERM GOAL #3   Title Patien twill dmeonstrate bilateral glut max strength of 4+/5 MMT and increased hip  abdction strength of 4-/5 indicating improved stability.    Time 8   Period Weeks   Status New   PT LONG TERM GOAL #4   Title Patient will be able to  ambulate with no assistive device 164ft.    Time 8   Period Weeks   Status New   PT LONG TERM GOAL #5   Title Patient will be independent with HEP.                Plan - 08/24/14 1022    Clinical Impression Statement Perfromed Berg balance test; patient did require slower explanations with physical demonstration but was able to follow commands and perform tasks well. Overall score 34, indicating significant fall risk. Performed supine ther ex for improved hip stability as well as sit to stand without hands in order to challenge CKC movement and coordination.    Rehab Potential Good   PT Frequency 2x / week   PT Duration 8 weeks   PT Treatment/Interventions Gait training;Stair training;Functional mobility training;Patient/family education;Passive range of motion;Therapeutic activities;Therapeutic exercise;Manual techniques;Balance training   PT Next Visit Plan Grade 1-2 hip IR mobilizations; supine and sitting clams; calf stretch in standing; correct hip alignment   Consulted and Agree with Plan of Care Patient;Family member/caregiver        Problem List Patient Active Problem List   Diagnosis Date Noted  . Expressive aphasia   . Acute blood loss anemia   . Acute renal failure syndrome   . Closed right hip fracture 06/12/2014  . Hip fracture 06/12/2014  . UTI (urinary tract infection) 06/12/2014  . Fall   . UTI (lower urinary tract infection)   . Vasovagal syncope 05/15/2011  . S/P stroke due to cerebrovascular disease 05/15/2011  . Hypertension 05/15/2011  . Urinary tract infection 05/15/2011  . LOWER LEG, ARTHRITIS, DEGEN./OSTEO 01/21/2009  . JOINT EFFUSION, RIGHT KNEE 01/21/2009  . KNEE PAIN 01/21/2009    Deniece Ree PT, DPT [place] [address] [place], [place], [address] Phone: [phone]   Fax:  [phone]

## 2014-08-26 ENCOUNTER — Ambulatory Visit (HOSPITAL_COMMUNITY): Payer: PPO | Admitting: Physical Therapy

## 2014-08-26 DIAGNOSIS — M25651 Stiffness of right hip, not elsewhere classified: Secondary | ICD-10-CM | POA: Diagnosis not present

## 2014-08-26 DIAGNOSIS — R262 Difficulty in walking, not elsewhere classified: Secondary | ICD-10-CM

## 2014-08-26 DIAGNOSIS — M25561 Pain in right knee: Secondary | ICD-10-CM

## 2014-08-26 DIAGNOSIS — M25661 Stiffness of right knee, not elsewhere classified: Secondary | ICD-10-CM

## 2014-08-26 NOTE — Therapy (Signed)
[place], [place], [address] Phone: [phone]   Fax:  [phone]  Physical Therapy Treatment  Patient Details  Name: [name] MRN: [id number] Date of Birth: 1935-01-29 Referring Provider:  Melrose Nakayama, MD  Encounter Date: 08/26/2014      PT End of Session - 08/26/14 1603    Visit Number 3   Number of Visits 16   Date for PT Re-Evaluation 09/19/14   Authorization Type Health team advantage   Authorization - Visit Number 3   Authorization - Number of Visits 16   PT Start Time [phone]   PT Stop Time 1015   PT Time Calculation (min) 39 min   Activity Tolerance Patient tolerated treatment well   Behavior During Therapy [facility] for tasks assessed/performed      Past Medical History  Diagnosis Date  . High cholesterol   . Hearing impaired person   . Hypertension   . Stroke     speech slightly , right sided weakness  . Kidney stones   . Degenerative joint disease of knee, right     right knee  . History of blood transfusion     Past Surgical History  Procedure Laterality Date  . Kidney stones    . Lithotripsy    . Peg placement      and removal  . Peg tube removal    . Tympanoplasty    . Tonsillectomy    . Appendectomy    . Radiology with anesthesia  04/22/2012    Procedure: RADIOLOGY WITH ANESTHESIA;  Surgeon: Rob Hickman, MD;  Location: [place];  Service: Radiology;  Laterality: N/A;  VERTEBRAL ARTERY STENT PLACEMENT AND CEREBRAL ANGIOGRAM  . Intramedullary (im) nail intertrochanteric Right 06/12/2014    Procedure: INTRAMEDULLARY (IM) NAIL INTERTROCHANTRIC RIGHT HIP;  Surgeon: Alta Corning, MD;  Location: [place];  Service: Orthopedics;  Laterality: Right;    There were no vitals filed for this visit.  Visit Diagnosis:  Hip stiffness, right  Right knee pain  Knee stiffness, right  Difficulty walking      Subjective Assessment - 08/26/14 0947    Symptoms Patient staes  feeling good and impressed with therapy so far   Currently in Pain? Yes   Pain Score 2    Pain Location Hip   Pain Orientation Right                       [facility] Adult PT Treatment/Exercise - 08/26/14 0001    Exercises   Exercises Knee/Hip   Knee/Hip Exercises: Standing   Heel Raises Limitations 20 reps wth cuing hor hieght   Forward Lunges 10 reps   Forward Lunges Limitations to 6"   Gait Training gait: High knees, heel butt kicks, back wards gait 59ft each   Other Standing Knee Exercises Sit to stand without hands 2x10   Knee/Hip Exercises: Seated   Other Seated Knee Exercises 20x    Knee/Hip Exercises: Supine   Bridges Both;2 sets;10 reps   Other Supine Knee Exercises Clams with Red Tband 20x   Knee/Hip Exercises: Sidelying   Clams Sidelying 20 each side with manual cues      Attempted hip internal rotation joint mobilizations, but patient had pain and was sensitive to touch along lateral thigh.             PT Short Term Goals - 08/20/14 1433    PT SHORT TERM GOAL #1  Title Patient will demonstrate hip internal rotation bilaterally >30 degrees   Time 4   Period Weeks   Status New   PT SHORT TERM GOAL #2   Title Patient will dmeonstrate 5x sit to stand <14 secodns indicating patient not at high risk of falls due to LE strength.    Time 4   Period Weeks   Status New   PT SHORT TERM GOAL #3   Title Patient will demonstrate improved TUG time of <15seconds to be able to de,emosntrate ability to get up and go withtou increased risk of falls.     Time 4   Period Weeks   Status New   PT SHORT TERM GOAL #4   Title Patient will dmeonstrate increased hip abduction strength of 3/5 MMT   Time 4   Period Weeks   Status New           PT Long Term Goals - 08/20/14 1438    PT LONG TERM GOAL #1   Title Patient will meonstrate a BERg balance score >51/56 indicatign patient not at high risk of falls.    Time 8   Period Weeks   Status New   PT LONG  TERM GOAL #2   Title Patient will demonstrate a negativeOber's test indicating improved frontal plane hip mobility.    Time 8   Period Weeks   Status New   PT LONG TERM GOAL #3   Title Patien twill dmeonstrate bilateral glut max strength of 4+/5 MMT and increased hip abdction strength of 4-/5 indicating improved stability.    Time 8   Period Weeks   Status New   PT LONG TERM GOAL #4   Title Patient will be able to ambulate with no assistive device 150ft.    Time 8   Period Weeks   Status New   PT LONG TERM GOAL #5   Title Patient will be independent with HEP.                Plan - 08/26/14 1604    Clinical Impression Statement Patient performed all exercises with good performance thoguh she required multimodal cuing for correct perofrmoance. Patient was unsteady during static lunges but able to correctly perform with therapist contact guard. Patient content with exercises thoguh she notes soreness following last session.    PT Next Visit Plan Continue LE strengthening and         Problem List Patient Active Problem List   Diagnosis Date Noted  . Expressive aphasia   . Acute blood loss anemia   . Acute renal failure syndrome   . Closed right hip fracture 06/12/2014  . Hip fracture 06/12/2014  . UTI (urinary tract infection) 06/12/2014  . Fall   . UTI (lower urinary tract infection)   . Vasovagal syncope 05/15/2011  . S/P stroke due to cerebrovascular disease 05/15/2011  . Hypertension 05/15/2011  . Urinary tract infection 05/15/2011  . LOWER LEG, ARTHRITIS, DEGEN./OSTEO 01/21/2009  . JOINT EFFUSION, RIGHT KNEE 01/21/2009  . KNEE PAIN 01/21/2009    Devona Konig R 08/26/2014, 4:12 PM  [place] [address] [place], [place], [address] Phone: [phone]   Fax:  [phone]

## 2014-08-31 ENCOUNTER — Ambulatory Visit (HOSPITAL_COMMUNITY): Payer: PPO | Admitting: Physical Therapy

## 2014-08-31 DIAGNOSIS — M25661 Stiffness of right knee, not elsewhere classified: Secondary | ICD-10-CM

## 2014-08-31 DIAGNOSIS — M25651 Stiffness of right hip, not elsewhere classified: Secondary | ICD-10-CM | POA: Diagnosis not present

## 2014-08-31 DIAGNOSIS — R262 Difficulty in walking, not elsewhere classified: Secondary | ICD-10-CM

## 2014-08-31 DIAGNOSIS — M25561 Pain in right knee: Secondary | ICD-10-CM

## 2014-08-31 NOTE — Therapy (Signed)
[place], [place], [address] Phone: [phone]   Fax:  [phone]  Physical Therapy Treatment  Patient Details  Name: [name] MRN: [id number] Date of Birth: 1934/12/13 Referring Provider:  Sinda Du, MD  Encounter Date: 08/31/2014      PT End of Session - 08/31/14 1208    Visit Number 4   Number of Visits 16   Date for PT Re-Evaluation 09/19/14   Authorization Type [employer] team advantage   Authorization - Visit Number 4   Authorization - Number of Visits 16   PT Start Time 1026   PT Stop Time 1104   PT Time Calculation (min) 38 min   Equipment Utilized During Treatment Gait belt   Activity Tolerance Patient limited by fatigue   Behavior During Therapy [facility] for tasks assessed/performed      Past Medical History  Diagnosis Date  . High cholesterol   . Hearing impaired person   . Hypertension   . Stroke     speech slightly , right sided weakness  . Kidney stones   . Degenerative joint disease of knee, right     right knee  . History of blood transfusion     Past Surgical History  Procedure Laterality Date  . Kidney stones    . Lithotripsy    . Peg placement      and removal  . Peg tube removal    . Tympanoplasty    . Tonsillectomy    . Appendectomy    . Radiology with anesthesia  04/22/2012    Procedure: RADIOLOGY WITH ANESTHESIA;  Surgeon: Rob Hickman, MD;  Location: [place];  Service: Radiology;  Laterality: N/A;  VERTEBRAL ARTERY STENT PLACEMENT AND CEREBRAL ANGIOGRAM  . Intramedullary (im) nail intertrochanteric Right 06/12/2014    Procedure: INTRAMEDULLARY (IM) NAIL INTERTROCHANTRIC RIGHT HIP;  Surgeon: Alta Corning, MD;  Location: [place];  Service: Orthopedics;  Laterality: Right;    There were no vitals filed for this visit.  Visit Diagnosis:  Hip stiffness, right  Right knee pain  Knee stiffness, right  Difficulty walking      Subjective Assessment -  08/31/14 1201    Symptoms Husband states that it took until Sunday to get over last treatemtent.  Pt states she has some pain and soreness in her Rt hip.    Currently in Pain? Yes   Pain Score 3    Pain Location Hip   Pain Orientation Right   Pain Descriptors / Indicators Aching   Pain Type Surgical pain             [facility] Adult PT Treatment/Exercise - 08/31/14 1203    Exercises   Exercises Knee/Hip   Knee/Hip Exercises: Standing   Heel Raises 10 reps   Functional Squat 10 reps   SLS x5    Other Standing Knee Exercises weight shifting anterior/posterior as welll as Rt to LT    Other Standing Knee Exercises Hip flexion, abduction and extension x 10 with t-band for resistance; Side step x 1 RT    Knee/Hip Exercises: Seated   Long Arc Quad Strengthening;Right;10 reps;Weights   Long Arc Quad Weight 5 lbs.   Other Seated Knee Exercises sit to stand  10 x with Rt back 10 x with Lt back    Other Seated Knee Exercises marching x 10               PT Education - 08/31/14 1207  Education provided Yes   Education Details For standing t-band exercises ( pt has red t-band at home)   Person(s) Educated Spouse;Patient   Methods Explanation   Comprehension Verbalized understanding;Returned demonstration          PT Short Term Goals - 08/20/14 1433    PT SHORT TERM GOAL #1   Title Patient will demonstrate hip internal rotation bilaterally >30 degrees   Time 4   Period Weeks   Status New   PT SHORT TERM GOAL #2   Title Patient will dmeonstrate 5x sit to stand <14 secodns indicating patient not at high risk of falls due to LE strength.    Time 4   Period Weeks   Status New   PT SHORT TERM GOAL #3   Title Patient will demonstrate improved TUG time of <15seconds to be able to de,emosntrate ability to get up and go withtou increased risk of falls.     Time 4   Period Weeks   Status New   PT SHORT TERM GOAL #4   Title Patient will dmeonstrate increased hip abduction  strength of 3/5 MMT   Time 4   Period Weeks   Status New           PT Long Term Goals - 08/20/14 1438    PT LONG TERM GOAL #1   Title Patient will meonstrate a BERg balance score >51/56 indicatign patient not at high risk of falls.    Time 8   Period Weeks   Status New   PT LONG TERM GOAL #2   Title Patient will demonstrate a negativeOber's test indicating improved frontal plane hip mobility.    Time 8   Period Weeks   Status New   PT LONG TERM GOAL #3   Title Patien twill dmeonstrate bilateral glut max strength of 4+/5 MMT and increased hip abdction strength of 4-/5 indicating improved stability.    Time 8   Period Weeks   Status New   PT LONG TERM GOAL #4   Title Patient will be able to ambulate with no assistive device 185ft.    Time 8   Period Weeks   Status New   PT LONG TERM GOAL #5   Title Patient will be independent with HEP.             Plan - 08/31/14 1209    Clinical Impression Statement Pt tends to drag Lt foot with side stepping to avoid full weight bearing on Rt LE but improved with encouragement.  Attempted standing marching exercises but pt is unable to have fulll weight on Rt at this timel. Pt continues to show improved balance in standing activities.   PT Next Visit Plan Continue with weight bearing activities until pt is able to weight bear on Rt only then progress to cane as able.         Problem List Patient Active Problem List   Diagnosis Date Noted  . Expressive aphasia   . Acute blood loss anemia   . Acute renal failure syndrome   . Closed right hip fracture 06/12/2014  . Hip fracture 06/12/2014  . UTI (urinary tract infection) 06/12/2014  . Fall   . UTI (lower urinary tract infection)   . Vasovagal syncope 05/15/2011  . S/P stroke due to cerebrovascular disease 05/15/2011  . Hypertension 05/15/2011  . Urinary tract infection 05/15/2011  . LOWER LEG, ARTHRITIS, DEGEN./OSTEO 01/21/2009  . JOINT EFFUSION, RIGHT KNEE 01/21/2009  .  KNEE PAIN  01/21/2009   Rayetta Humphrey PT   08/31/2014, 12:12 PM  [place] [address] [place], [place], [address] Phone: [phone]   Fax:  [phone]

## 2014-09-02 ENCOUNTER — Ambulatory Visit (HOSPITAL_COMMUNITY): Payer: PPO | Admitting: Physical Therapy

## 2014-09-02 DIAGNOSIS — M25651 Stiffness of right hip, not elsewhere classified: Secondary | ICD-10-CM

## 2014-09-02 DIAGNOSIS — R262 Difficulty in walking, not elsewhere classified: Secondary | ICD-10-CM

## 2014-09-02 DIAGNOSIS — M25661 Stiffness of right knee, not elsewhere classified: Secondary | ICD-10-CM

## 2014-09-02 DIAGNOSIS — M25561 Pain in right knee: Secondary | ICD-10-CM

## 2014-09-02 NOTE — Patient Instructions (Signed)
Abduction: Clam (Eccentric) - Side-Lying   Lie on side with knees bent. Lift top knee, keeping feet together. Keep trunk steady. Slowly lower for 3-5 seconds. 10 reps per set, 3 sets per day, 7 days per week. Add 2-3 lbs when you achieve 30 repetitions.  Copyright  VHI. All rights reserved.  Abduction   Slide one leg out to side. Keep kneecap pointing up. Gently bring leg back to pillow. Repeat with other leg. Repeat 10-30 times. Do 2-3 sessions per day.  http://gt2.exer.us/374   Copyright  VHI. All rights reserved.   ABDUCTION: Standing (Active)   Stand, feet flat. Lift right leg out to side. . Complete 2 sets of 10 repetitions. Perform 2-3 sessions per day.  http://gtsc.exer.us/111   Copyright  VHI. All rights reserved.

## 2014-09-02 NOTE — Therapy (Signed)
[place], [place], [address] Phone: [phone]   Fax:  [phone]  Physical Therapy Treatment  Patient Details  Name: [name] MRN: [id number] Date of Birth: 1934-07-02 Referring Provider:  Melrose Nakayama, MD  Encounter Date: 09/02/2014      PT End of Session - 09/02/14 1529    Visit Number 5   Number of Visits 16   Date for PT Re-Evaluation 09/19/14   Authorization Type [employer] team advantage   Authorization - Visit Number 5   Authorization - Number of Visits 16   PT Start Time [id number]   PT Stop Time 1516   PT Time Calculation (min) 45 min   Activity Tolerance Patient limited by fatigue   Behavior During Therapy [facility] for tasks assessed/performed      Past Medical History  Diagnosis Date  . High cholesterol   . Hearing impaired person   . Hypertension   . Stroke     speech slightly , right sided weakness  . Kidney stones   . Degenerative joint disease of knee, right     right knee  . History of blood transfusion     Past Surgical History  Procedure Laterality Date  . Kidney stones    . Lithotripsy    . Peg placement      and removal  . Peg tube removal    . Tympanoplasty    . Tonsillectomy    . Appendectomy    . Radiology with anesthesia  04/22/2012    Procedure: RADIOLOGY WITH ANESTHESIA;  Surgeon: Rob Hickman, MD;  Location: [place];  Service: Radiology;  Laterality: N/A;  VERTEBRAL ARTERY STENT PLACEMENT AND CEREBRAL ANGIOGRAM  . Intramedullary (im) nail intertrochanteric Right 06/12/2014    Procedure: INTRAMEDULLARY (IM) NAIL INTERTROCHANTRIC RIGHT HIP;  Surgeon: Alta Corning, MD;  Location: [place];  Service: Orthopedics;  Laterality: Right;    There were no vitals filed for this visit.  Visit Diagnosis:  Hip stiffness, right  Right knee pain  Knee stiffness, right  Difficulty walking      Subjective Assessment - 09/02/14 1840    Symptoms Patient's husband states  his wife has not been performing HEP for him.    Patient Stated Goals to be able to perform sit to stand without difficulty. and ambulate without an  AD to the cain or walker without difficulty, get in and out of shower (4 inch lip)    Currently in Pain? No/denies                       [facility] Adult PT Treatment/Exercise - 09/02/14 0001    Knee/Hip Exercises: Standing   Heel Raises 20 reps   Heel Raises Limitations heel- toe raises   Forward Lunges 10 reps   Forward Lunges Limitations to 6"   Forward Step Up Step Height: 4";10 reps;Both   Functional Squat 10 reps   Functional Squat Limitations from chair   Rocker Board 2 minutes   Rocker Board Limitations ap and side to side   SLS marching with 3 second hold 10x each   Gait Training gait: High knees, heel butt kicks, back wards gait 70ft each   Other Standing Knee Exercises Hip flexion, abduction and extension x 10 2 sets   Knee/Hip Exercises: Seated   Other Seated Knee Exercises 20x clams                PT  Education - 09/02/14 1844    Education Details HEP: for hip abductor strengthening.    Person(s) Educated Spouse;Patient   Methods Explanation   Comprehension Verbalized understanding;Returned demonstration          PT Short Term Goals - 08/20/14 1433    PT SHORT TERM GOAL #1   Title Patient will demonstrate hip internal rotation bilaterally >30 degrees   Time 4   Period Weeks   Status New   PT SHORT TERM GOAL #2   Title Patient will dmeonstrate 5x sit to stand <14 secodns indicating patient not at high risk of falls due to LE strength.    Time 4   Period Weeks   Status New   PT SHORT TERM GOAL #3   Title Patient will demonstrate improved TUG time of <15seconds to be able to de,emosntrate ability to get up and go withtou increased risk of falls.     Time 4   Period Weeks   Status New   PT SHORT TERM GOAL #4   Title Patient will dmeonstrate increased hip abduction strength of 3/5 MMT   Time 4    Period Weeks   Status New           PT Long Term Goals - 08/20/14 1438    PT LONG TERM GOAL #1   Title Patient will meonstrate a BERg balance score >51/56 indicatign patient not at high risk of falls.    Time 8   Period Weeks   Status New   PT LONG TERM GOAL #2   Title Patient will demonstrate a negativeOber's test indicating improved frontal plane hip mobility.    Time 8   Period Weeks   Status New   PT LONG TERM GOAL #3   Title Patien twill dmeonstrate bilateral glut max strength of 4+/5 MMT and increased hip abdction strength of 4-/5 indicating improved stability.    Time 8   Period Weeks   Status New   PT LONG TERM GOAL #4   Title Patient will be able to ambulate with no assistive device 144ft.    Time 8   Period Weeks   Status New   PT LONG TERM GOAL #5   Title Patient will be independent with HEP.                Plan - 09/02/14 1845    Clinical Impression Statement Patient displays inability to single leg stand on Rt withotu UE assistance and displays diffiuclty walking withotu walker. patient's husband present and stantes limited adhearance to HEp resulting in limited progression in strength and gait thus far. Session focused exclusively on gait amechanicns to increase efficiency and improve hip abductiopn strength to decrease trendelenberg gait.    PT Next Visit Plan Continue with weight bearing strengthening activities until pt is able to fully weight bear on Rt only to perform Rt SLS then progress to cane for gait. Continue to promote HEP for strengtheing.         Problem List Patient Active Problem List   Diagnosis Date Noted  . Expressive aphasia   . Acute blood loss anemia   . Acute renal failure syndrome   . Closed right hip fracture 06/12/2014  . Hip fracture 06/12/2014  . UTI (urinary tract infection) 06/12/2014  . Fall   . UTI (lower urinary tract infection)   . Vasovagal syncope 05/15/2011  . S/P stroke due to cerebrovascular disease  05/15/2011  . Hypertension 05/15/2011  . Urinary tract  infection 05/15/2011  . LOWER LEG, ARTHRITIS, DEGEN./OSTEO 01/21/2009  . JOINT EFFUSION, RIGHT KNEE 01/21/2009  . KNEE PAIN 01/21/2009   Devona Konig PT DPT [place], [place], [address] Phone: [phone]   Fax:  [phone]

## 2014-09-07 ENCOUNTER — Encounter (HOSPITAL_COMMUNITY): Payer: PPO | Admitting: Physical Therapy

## 2014-09-09 ENCOUNTER — Telehealth (HOSPITAL_COMMUNITY): Payer: Self-pay

## 2014-09-09 ENCOUNTER — Ambulatory Visit (HOSPITAL_COMMUNITY): Payer: PPO

## 2014-09-09 NOTE — Telephone Encounter (Signed)
She is in a lot of pain in her leg and hip and at the MD office now

## 2014-09-11 ENCOUNTER — Encounter (HOSPITAL_COMMUNITY): Payer: PPO | Admitting: Physical Therapy

## 2014-09-14 ENCOUNTER — Ambulatory Visit (HOSPITAL_COMMUNITY): Payer: PPO | Admitting: Physical Therapy

## 2014-09-16 ENCOUNTER — Encounter (HOSPITAL_COMMUNITY): Payer: PPO

## 2014-09-18 ENCOUNTER — Encounter (HOSPITAL_COMMUNITY): Payer: PPO

## 2014-09-21 ENCOUNTER — Ambulatory Visit (HOSPITAL_COMMUNITY): Payer: PPO | Admitting: Physical Therapy

## 2014-09-21 ENCOUNTER — Telehealth (HOSPITAL_COMMUNITY): Payer: Self-pay | Admitting: Physical Therapy

## 2014-09-21 NOTE — Telephone Encounter (Signed)
She has apptments with other MDs and will not come back in until they know what the Cat Scan says on Wed 09/23/14. Husband stated that the PT will not be as agressive if the continue he felt PT contributed to her problems

## 2014-09-23 ENCOUNTER — Encounter (HOSPITAL_COMMUNITY): Payer: PPO | Admitting: Physical Therapy

## 2014-09-25 ENCOUNTER — Encounter (HOSPITAL_COMMUNITY): Payer: PPO | Admitting: Physical Therapy

## 2014-09-28 ENCOUNTER — Other Ambulatory Visit: Payer: Self-pay | Admitting: Orthopaedic Surgery

## 2014-09-28 ENCOUNTER — Encounter (HOSPITAL_COMMUNITY): Payer: PPO | Admitting: Physical Therapy

## 2014-09-28 DIAGNOSIS — M25551 Pain in right hip: Secondary | ICD-10-CM

## 2014-09-30 ENCOUNTER — Encounter (HOSPITAL_COMMUNITY): Payer: PPO | Admitting: Physical Therapy

## 2014-10-01 ENCOUNTER — Ambulatory Visit
Admission: RE | Admit: 2014-10-01 | Discharge: 2014-10-01 | Disposition: A | Discharge status: Home / Self Care | Payer: PPO | Source: Ambulatory Visit | Attending: Orthopaedic Surgery | Admitting: Orthopaedic Surgery

## 2014-10-01 ENCOUNTER — Other Ambulatory Visit: Payer: Self-pay | Admitting: Orthopedic Surgery

## 2014-10-01 DIAGNOSIS — M25551 Pain in right hip: Secondary | ICD-10-CM

## 2014-10-02 ENCOUNTER — Encounter (HOSPITAL_COMMUNITY): Payer: PPO

## 2014-10-02 ENCOUNTER — Encounter (HOSPITAL_COMMUNITY): Payer: Self-pay | Admitting: *Deleted

## 2014-10-02 NOTE — Progress Notes (Signed)
Per pt's husband, Plavix was not stopped for procedure. He states Dr. Berenice Primas instructed them not to stop it.   Pt has apraxia. It is helpful to write down the question or info you are giving her. Husband is very helpful with understanding what she meant to say.

## 2014-10-05 ENCOUNTER — Ambulatory Visit (HOSPITAL_COMMUNITY)
Admission: RE | Admit: 2014-10-05 | Discharge: 2014-10-05 | Disposition: A | Discharge status: Home / Self Care | Payer: PPO | Source: Ambulatory Visit | Attending: Orthopedic Surgery | Admitting: Orthopedic Surgery

## 2014-10-05 ENCOUNTER — Encounter (HOSPITAL_COMMUNITY): Payer: Self-pay | Admitting: *Deleted

## 2014-10-05 ENCOUNTER — Encounter (HOSPITAL_COMMUNITY)
Admission: RE | Disposition: A | Discharge status: Home / Self Care | Payer: Self-pay | Source: Ambulatory Visit | Attending: Orthopedic Surgery

## 2014-10-05 ENCOUNTER — Ambulatory Visit (HOSPITAL_COMMUNITY): Payer: PPO | Admitting: Certified Registered Nurse Anesthetist

## 2014-10-05 ENCOUNTER — Ambulatory Visit (HOSPITAL_COMMUNITY): Payer: PPO

## 2014-10-05 DIAGNOSIS — I252 Old myocardial infarction: Secondary | ICD-10-CM | POA: Insufficient documentation

## 2014-10-05 DIAGNOSIS — Z87891 Personal history of nicotine dependence: Secondary | ICD-10-CM | POA: Insufficient documentation

## 2014-10-05 DIAGNOSIS — M25551 Pain in right hip: Secondary | ICD-10-CM

## 2014-10-05 DIAGNOSIS — Z791 Long term (current) use of non-steroidal anti-inflammatories (NSAID): Secondary | ICD-10-CM | POA: Diagnosis not present

## 2014-10-05 DIAGNOSIS — I10 Essential (primary) hypertension: Secondary | ICD-10-CM | POA: Insufficient documentation

## 2014-10-05 DIAGNOSIS — H9190 Unspecified hearing loss, unspecified ear: Secondary | ICD-10-CM | POA: Diagnosis not present

## 2014-10-05 DIAGNOSIS — Z7902 Long term (current) use of antithrombotics/antiplatelets: Secondary | ICD-10-CM | POA: Insufficient documentation

## 2014-10-05 DIAGNOSIS — Z7901 Long term (current) use of anticoagulants: Secondary | ICD-10-CM | POA: Diagnosis not present

## 2014-10-05 DIAGNOSIS — Z79899 Other long term (current) drug therapy: Secondary | ICD-10-CM | POA: Diagnosis not present

## 2014-10-05 DIAGNOSIS — M179 Osteoarthritis of knee, unspecified: Secondary | ICD-10-CM | POA: Diagnosis not present

## 2014-10-05 DIAGNOSIS — E780 Pure hypercholesterolemia: Secondary | ICD-10-CM | POA: Diagnosis not present

## 2014-10-05 DIAGNOSIS — Z8673 Personal history of transient ischemic attack (TIA), and cerebral infarction without residual deficits: Secondary | ICD-10-CM | POA: Insufficient documentation

## 2014-10-05 DIAGNOSIS — Y793 Surgical instruments, materials and orthopedic devices (including sutures) associated with adverse incidents: Secondary | ICD-10-CM | POA: Insufficient documentation

## 2014-10-05 DIAGNOSIS — I509 Heart failure, unspecified: Secondary | ICD-10-CM | POA: Diagnosis not present

## 2014-10-05 DIAGNOSIS — T8484XA Pain due to internal orthopedic prosthetic devices, implants and grafts, initial encounter: Secondary | ICD-10-CM | POA: Diagnosis present

## 2014-10-05 DIAGNOSIS — T84194A Other mechanical complication of internal fixation device of right femur, initial encounter: Secondary | ICD-10-CM | POA: Diagnosis not present

## 2014-10-05 DIAGNOSIS — Z419 Encounter for procedure for purposes other than remedying health state, unspecified: Secondary | ICD-10-CM

## 2014-10-05 HISTORY — DX: Constipation, unspecified: K59.00

## 2014-10-05 HISTORY — DX: Personal history of urinary (tract) infections: Z87.440

## 2014-10-05 HISTORY — DX: Reserved for concepts with insufficient information to code with codable children: IMO0002

## 2014-10-05 HISTORY — PX: HARDWARE REMOVAL: SHX979

## 2014-10-05 LAB — BASIC METABOLIC PANEL
Anion gap: 9 (ref 5–15)
BUN: 24 mg/dL — ABNORMAL HIGH (ref 6–20)
CALCIUM: 9 mg/dL (ref 8.9–10.3)
CO2: 26 mmol/L (ref 22–32)
Chloride: 105 mmol/L (ref 101–111)
Creatinine, Ser: 1.05 mg/dL — ABNORMAL HIGH (ref 0.44–1.00)
GFR, EST AFRICAN AMERICAN: 58 mL/min — AB (ref >60)
GFR, EST NON AFRICAN AMERICAN: 50 mL/min — AB (ref >60)
GLUCOSE: 105 mg/dL — AB (ref 70–99)
POTASSIUM: 3.5 mmol/L (ref 3.5–5.1)
SODIUM: 140 mmol/L (ref 135–145)

## 2014-10-05 LAB — CBC
HCT: 40.5 % (ref 36.0–46.0)
Hemoglobin: 12.9 g/dL (ref 12.0–15.0)
MCH: 26 pg (ref 26.0–34.0)
MCHC: 31.9 g/dL (ref 30.0–36.0)
MCV: 81.7 fL (ref 78.0–100.0)
Platelets: 254 10*3/uL (ref 150–400)
RBC: 4.96 MIL/uL (ref 3.87–5.11)
RDW: 13.1 % (ref 11.5–15.5)
WBC: 7.3 10*3/uL (ref 4.0–10.5)

## 2014-10-05 LAB — PROTIME-INR
INR: 1.04 (ref 0.00–1.49)
Prothrombin Time: 13.7 seconds (ref 11.6–15.2)

## 2014-10-05 SURGERY — REMOVAL, HARDWARE
Anesthesia: General | Site: Hip | Laterality: Right

## 2014-10-05 MED ORDER — ROCURONIUM BROMIDE 50 MG/5ML IV SOLN
INTRAVENOUS | Status: AC
  Filled 2014-10-05: qty 1

## 2014-10-05 MED ORDER — PROPOFOL 10 MG/ML IV BOLUS
INTRAVENOUS | Status: AC
  Filled 2014-10-05: qty 20

## 2014-10-05 MED ORDER — CHLORHEXIDINE GLUCONATE 4 % EX LIQD
60.0000 mL | Freq: Once | CUTANEOUS | Status: DC
  Filled 2014-10-05: qty 60

## 2014-10-05 MED ORDER — ACETAMINOPHEN 325 MG PO TABS: 325.0000 mg | ORAL_TABLET | ORAL | Status: DC | PRN

## 2014-10-05 MED ORDER — CEFAZOLIN SODIUM-DEXTROSE 2-3 GM-% IV SOLR
2.0000 g | INTRAVENOUS | Status: AC
  Administered 2014-10-05: 2 g via INTRAVENOUS
  Filled 2014-10-05: qty 50

## 2014-10-05 MED ORDER — LACTATED RINGERS IV SOLN
INTRAVENOUS | Status: DC
  Administered 2014-10-05: 13:00:00 via INTRAVENOUS
  Administered 2014-10-05: 50 mL/h via INTRAVENOUS

## 2014-10-05 MED ORDER — FENTANYL CITRATE (PF) 100 MCG/2ML IJ SOLN
INTRAMUSCULAR | Status: DC | PRN
  Administered 2014-10-05 (×3): 50 ug via INTRAVENOUS

## 2014-10-05 MED ORDER — OXYCODONE HCL 5 MG/5ML PO SOLN: 5.0000 mg | Freq: Once | ORAL | Status: DC | PRN

## 2014-10-05 MED ORDER — FENTANYL CITRATE (PF) 250 MCG/5ML IJ SOLN
INTRAMUSCULAR | Status: AC
  Filled 2014-10-05: qty 5

## 2014-10-05 MED ORDER — ONDANSETRON HCL 4 MG/2ML IJ SOLN
INTRAMUSCULAR | Status: AC
  Filled 2014-10-05: qty 2

## 2014-10-05 MED ORDER — SUCCINYLCHOLINE CHLORIDE 20 MG/ML IJ SOLN
INTRAMUSCULAR | Status: AC
  Filled 2014-10-05: qty 2

## 2014-10-05 MED ORDER — LACTATED RINGERS IV SOLN
INTRAVENOUS | Status: DC
  Administered 2014-10-05: 10 mL/h via INTRAVENOUS

## 2014-10-05 MED ORDER — LIDOCAINE HCL (CARDIAC) 20 MG/ML IV SOLN
INTRAVENOUS | Status: AC
  Filled 2014-10-05: qty 5

## 2014-10-05 MED ORDER — EPHEDRINE SULFATE 50 MG/ML IJ SOLN
INTRAMUSCULAR | Status: DC | PRN
  Administered 2014-10-05 (×2): 5 mg via INTRAVENOUS
  Administered 2014-10-05: 10 mg via INTRAVENOUS

## 2014-10-05 MED ORDER — LIDOCAINE HCL (CARDIAC) 20 MG/ML IV SOLN
INTRAVENOUS | Status: DC | PRN
  Administered 2014-10-05: 60 mg via INTRAVENOUS

## 2014-10-05 MED ORDER — PROPOFOL 10 MG/ML IV BOLUS
INTRAVENOUS | Status: DC | PRN
  Administered 2014-10-05: 120 mg via INTRAVENOUS

## 2014-10-05 MED ORDER — FENTANYL CITRATE (PF) 100 MCG/2ML IJ SOLN: 25.0000 ug | INTRAMUSCULAR | Status: DC | PRN

## 2014-10-05 MED ORDER — ONDANSETRON HCL 4 MG/2ML IJ SOLN
INTRAMUSCULAR | Status: DC | PRN
  Administered 2014-10-05: 4 mg via INTRAVENOUS

## 2014-10-05 MED ORDER — EPHEDRINE SULFATE 50 MG/ML IJ SOLN
INTRAMUSCULAR | Status: AC
  Filled 2014-10-05: qty 2

## 2014-10-05 MED ORDER — ACETAMINOPHEN 160 MG/5ML PO SOLN
325.0000 mg | ORAL | Status: DC | PRN
  Filled 2014-10-05: qty 20.3

## 2014-10-05 MED ORDER — OXYCODONE HCL 5 MG PO TABS: 5.0000 mg | ORAL_TABLET | Freq: Once | ORAL | Status: DC | PRN

## 2014-10-05 SURGICAL SUPPLY — 46 items
AR SCREW REMOVAL TOOL ×1 IMPLANT
BANDAGE ELASTIC 4 VELCRO ST LF (GAUZE/BANDAGES/DRESSINGS) IMPLANT
BANDAGE ELASTIC 6 VELCRO ST LF (GAUZE/BANDAGES/DRESSINGS) IMPLANT
BANDAGE ESMARK 6X9 LF (GAUZE/BANDAGES/DRESSINGS) IMPLANT
BNDG CMPR 9X6 STRL LF SNTH (GAUZE/BANDAGES/DRESSINGS)
BNDG COHESIVE 4X5 TAN STRL (GAUZE/BANDAGES/DRESSINGS) IMPLANT
BNDG ESMARK 6X9 LF (GAUZE/BANDAGES/DRESSINGS)
BNDG GAUZE ELAST 4 BULKY (GAUZE/BANDAGES/DRESSINGS) ×2 IMPLANT
COVER SURGICAL LIGHT HANDLE (MISCELLANEOUS) ×2 IMPLANT
CUFF TOURNIQUET SINGLE 34IN LL (TOURNIQUET CUFF) IMPLANT
CUFF TOURNIQUET SINGLE 44IN (TOURNIQUET CUFF) IMPLANT
DRAPE C-ARM 42X72 X-RAY (DRAPES) IMPLANT
DRAPE EXTREMITY T 121X128X90 (DRAPE) IMPLANT
DRAPE INCISE IOBAN 66X45 STRL (DRAPES) IMPLANT
DRAPE ORTHO SPLIT 77X108 STRL (DRAPES)
DRAPE PROXIMA HALF (DRAPES) IMPLANT
DRAPE SURG ORHT 6 SPLT 77X108 (DRAPES) IMPLANT
DRSG EMULSION OIL 3X3 NADH (GAUZE/BANDAGES/DRESSINGS) ×2 IMPLANT
DRSG MEPILEX BORDER 4X4 (GAUZE/BANDAGES/DRESSINGS) ×1 IMPLANT
DRSG PAD ABDOMINAL 8X10 ST (GAUZE/BANDAGES/DRESSINGS) ×2 IMPLANT
ELECT REM PT RETURN 9FT ADLT (ELECTROSURGICAL) ×2
ELECTRODE REM PT RTRN 9FT ADLT (ELECTROSURGICAL) ×1 IMPLANT
GAUZE SPONGE 4X4 12PLY STRL (GAUZE/BANDAGES/DRESSINGS) ×2 IMPLANT
GLOVE BIOGEL PI IND STRL 8 (GLOVE) ×2 IMPLANT
GLOVE BIOGEL PI INDICATOR 8 (GLOVE) ×2
GLOVE ECLIPSE 7.5 STRL STRAW (GLOVE) ×4 IMPLANT
GOWN STRL REUS W/ TWL LRG LVL3 (GOWN DISPOSABLE) ×1 IMPLANT
GOWN STRL REUS W/ TWL XL LVL3 (GOWN DISPOSABLE) ×2 IMPLANT
GOWN STRL REUS W/TWL LRG LVL3 (GOWN DISPOSABLE) ×2
GOWN STRL REUS W/TWL XL LVL3 (GOWN DISPOSABLE) ×4
GUIDEPIN 3.2X17.5 THRD DISP (PIN) ×1 IMPLANT
KIT BASIN OR (CUSTOM PROCEDURE TRAY) ×2 IMPLANT
KIT ROOM TURNOVER OR (KITS) ×2 IMPLANT
MANIFOLD NEPTUNE II (INSTRUMENTS) ×2 IMPLANT
NS IRRIG 1000ML POUR BTL (IV SOLUTION) ×2 IMPLANT
PACK GENERAL/GYN (CUSTOM PROCEDURE TRAY) ×2 IMPLANT
PAD ARMBOARD 7.5X6 YLW CONV (MISCELLANEOUS) ×4 IMPLANT
PAD CAST 4YDX4 CTTN HI CHSV (CAST SUPPLIES) ×1 IMPLANT
PADDING CAST COTTON 4X4 STRL (CAST SUPPLIES) ×2
ROD EXTRATION AR (Rod) ×1 IMPLANT
SPONGE GAUZE 4X4 12PLY STER LF (GAUZE/BANDAGES/DRESSINGS) ×1 IMPLANT
STAPLER VISISTAT 35W (STAPLE) IMPLANT
STOCKINETTE IMPERVIOUS 9X36 MD (GAUZE/BANDAGES/DRESSINGS) IMPLANT
TOWEL OR 17X24 6PK STRL BLUE (TOWEL DISPOSABLE) ×2 IMPLANT
TOWEL OR 17X26 10 PK STRL BLUE (TOWEL DISPOSABLE) ×2 IMPLANT
WATER STERILE IRR 1000ML POUR (IV SOLUTION) ×2 IMPLANT

## 2014-10-05 NOTE — Anesthesia Preprocedure Evaluation (Addendum)
Anesthesia Evaluation  Patient identified by MRN, date of birth, ID band Patient awake    Reviewed: Allergy & Precautions, NPO status , Patient's Chart, lab work & pertinent test results  History of Anesthesia Complications Negative for: history of anesthetic complications  Airway Mallampati: II  TM Distance: >3 FB Neck ROM: Full    Dental  (+) Teeth Intact   Pulmonary neg shortness of breath, neg sleep apnea, neg COPDneg recent URI, former smoker,  breath sounds clear to auscultation        Cardiovascular hypertension, Pt. on medications - angina- Past MI and - CHF Rhythm:Regular     Neuro/Psych CVA, Residual Symptoms negative psych ROS   GI/Hepatic negative GI ROS, Neg liver ROS,   Endo/Other    Renal/GU Renal InsufficiencyRenal disease     Musculoskeletal  (+) Arthritis -,   Abdominal   Peds  Hematology negative hematology ROS (+)   Anesthesia Other Findings   Reproductive/Obstetrics                           Anesthesia Physical Anesthesia Plan  ASA: III  Anesthesia Plan: General   Post-op Pain Management:    Induction: Intravenous  Airway Management Planned: LMA  Additional Equipment:   Intra-op Plan:   Post-operative Plan: Extubation in OR  Informed Consent: I have reviewed the patients History and Physical, chart, labs and discussed the procedure including the risks, benefits and alternatives for the proposed anesthesia with the patient or authorized representative who has indicated his/her understanding and acceptance.   Dental advisory given  Plan Discussed with: Surgeon and CRNA  Anesthesia Plan Comments:        Anesthesia Quick Evaluation

## 2014-10-05 NOTE — H&P (Signed)
PREOPERATIVE H&P  Chief Complaint: r hip pain  HPI: [name] is a 80 y.o. female who presents for evaluation of r hip pain. It has been present for 2 weeks and has been worsening. She has failed conservative measures. Pain is rated as severe. Pt underwent im rod placement for intertroch fx severe.  She did great until 2 weeks ago and x-ray and CT showed screw migration into joint.  Past Medical History  Diagnosis Date  . High cholesterol   . Hearing impaired person     wears hearing aids  . Hypertension   . Kidney stones   . Degenerative joint disease of knee, right     right knee  . History of blood transfusion   . 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 Hx: UTI (urinary tract infection)   . Constipation   . 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 Kidney stones    . Lithotripsy    . Peg placement      and removal  . Peg tube removal    . Tympanoplasty    . Tonsillectomy    . Appendectomy    . Radiology with anesthesia  04/22/2012    Procedure: RADIOLOGY WITH ANESTHESIA;  Surgeon: Rob Hickman, MD;  Location: [place];  Service: Radiology;  Laterality: N/A;  VERTEBRAL ARTERY STENT PLACEMENT AND CEREBRAL ANGIOGRAM  . Intramedullary (im) nail intertrochanteric Right 06/12/2014    Procedure: INTRAMEDULLARY (IM) NAIL INTERTROCHANTRIC RIGHT HIP;  Surgeon: Alta Corning, MD;  Location: [place];  Service: Orthopedics;  Laterality: Right;   History   Social History  . Marital Status: Married    Spouse Name: N/A  . Number of Children: N/A  . Years of Education: N/A   Social History Main Topics  . Smoking status: Former Smoker    Quit date: 10/02/1983  . Smokeless tobacco: Never Used  . Alcohol Use: Yes     Comment: occasional wine  . Drug Use: No  . Sexual Activity: Not on file   Other Topics Concern  . None   Social History  Narrative   Family History  Problem Relation Age of Onset  . CVA Father    No Known Allergies Prior to Admission medications   Medication Sig Start Date End Date Taking? Authorizing Provider  clopidogrel (PLAVIX) 75 MG tablet Take 1 tablet (75 mg total) by mouth daily. 07/13/14  Yes Reyne Dumas, MD  docusate sodium 100 MG CAPS Take 100 mg by mouth 2 (two) times daily. 06/16/14  Yes Reyne Dumas, MD  lisinopril-hydrochlorothiazide (PRINZIDE,ZESTORETIC) 20-12.5 MG per tablet Take 1 tablet by mouth daily.     Yes Historical Provider, MD  Multiple Vitamins-Minerals (CENTRUM ULTRA WOMENS) TABS Take 1 tablet by mouth daily.     Yes Historical Provider, MD  naproxen sodium (ANAPROX) 220 MG tablet Take 440 mg by mouth 2 (two) times daily with a meal.   Yes Historical Provider, MD  polyethylene glycol (MIRALAX / GLYCOLAX) packet Take 17 g by mouth daily. 06/16/14  Yes Reyne Dumas, MD  rosuvastatin (CRESTOR) 20 MG tablet Take 20 mg by mouth daily.   Yes Historical Provider, MD  senna (SENOKOT) 8.6 MG tablet Take 1 tablet by mouth daily.     Yes Historical Provider, MD  ferrous sulfate 325 (65 FE) MG tablet Take 1 tablet (  325 mg total) by mouth 2 (two) times daily with a meal. Patient not taking: Reported on 10/02/2014 06/16/14   Reyne Dumas, MD  HYDROcodone-acetaminophen (NORCO/VICODIN) 5-325 MG per tablet Take 1-2 tablets by mouth every 6 (six) hours as needed for moderate pain. Patient not taking: Reported on 10/02/2014 06/16/14   Reyne Dumas, MD  rivaroxaban (XARELTO) 10 MG TABS tablet Take 1 tablet (10 mg total) by mouth daily. 06/17/14 07/13/14  Reyne Dumas, MD     Positive ROS: none  All other systems have been reviewed and were otherwise negative with the exception of those mentioned in the HPI and as above.  Physical Exam: Filed Vitals:   10/05/14 1142  BP: 173/45  Pulse: 87  Temp: 98.3 F (36.8 C)  Resp: 18    General: Alert, no acute distress Cardiovascular: No pedal  edema Respiratory: No cyanosis, no use of accessory musculature GI: No organomegaly, abdomen is soft and non-tender Skin: No lesions in the area of chief complaint Neurologic: Sensation intact distally Psychiatric: Patient is competent for consent with normal mood and affect Lymphatic: No axillary or cervical lymphadenopathy  MUSCULOSKELETAL: r hip pain with rom pain with weight bearing  Assessment/Plan: retained screw right hip Plan for Procedure(s): HARDWARE REMOVAL  The risks benefits and alternatives were discussed with the patient including but not limited to the risks of nonoperative treatment, versus surgical intervention including infection, bleeding, nerve injury, malunion, nonunion, hardware prominence, hardware failure, need for hardware removal, blood clots, cardiopulmonary complications, morbidity, mortality, among others, and they were willing to proceed.  Predicted outcome is good, although there will be at least a six to nine month expected recovery.  Luwanna Brossman L, MD 10/05/2014 1:23 PM

## 2014-10-05 NOTE — Op Note (Signed)
10/05/2014  2:38 PM  PATIENT:  [name]  80 y.o. female  PRE-OPERATIVE DIAGNOSIS:  retained screw right hip  POST-OPERATIVE DIAGNOSIS:  retained screw right hip  PROCEDURE:  Procedure(s): HARDWARE REMOVAL (Right)  SURGEON:  Surgeon(s) and Role:    * Dorna Leitz, MD - Primary  PHYSICIAN ASSISTANT:   ASSISTANTS: bethune   ANESTHESIA:   general  EBL:     BLOOD ADMINISTERED:none  DRAINS: none   LOCAL MEDICATIONS USED:  MARCAINE     SPECIMEN:  No Specimen  DISPOSITION OF SPECIMEN:  N/A  COUNTS:  YES  TOURNIQUET:  * No tourniquets in log *  DICTATION: .Other Dictation: Dictation Number [id number]  PLAN OF CARE: Discharge to home after PACU  PATIENT DISPOSITION:  PACU - hemodynamically stable.   Delay start of Pharmacological VTE agent (>24hrs) due to surgical blood loss or risk of bleeding: no

## 2014-10-05 NOTE — Discharge Instructions (Signed)
Apply ice to right hip for a couple of days. You may change the hip dressing in 3 or 4 days or as needed. Weight-bear as tolerated on the right lower extremity.

## 2014-10-05 NOTE — Transfer of Care (Signed)
Immediate Anesthesia Transfer of Care Note  Patient: [name]  Procedure(s) Performed: Procedure(s): HARDWARE REMOVAL (Right)  Patient Location: PACU  Anesthesia Type:General  Level of Consciousness: awake, alert  and oriented  Airway & Oxygen Therapy: Patient Spontanous Breathing and Patient connected to face mask oxygen  Post-op Assessment: Report given to RN and Post -op Vital signs reviewed and stable  Post vital signs: Reviewed and stable  Last Vitals:  Filed Vitals:   10/05/14 1142  BP: 173/45  Pulse: 87  Temp: 36.8 C  Resp: 18    Complications: No apparent anesthesia complications

## 2014-10-05 NOTE — Anesthesia Procedure Notes (Signed)
Procedure Name: LMA Insertion Date/Time: 10/05/2014 1:39 PM Performed by: Clearnce Sorrel Pre-anesthesia Checklist: Patient identified, Timeout performed, Emergency Drugs available, Suction available and Patient being monitored Patient Re-evaluated:Patient Re-evaluated prior to inductionOxygen Delivery Method: Circle system utilized Preoxygenation: Pre-oxygenation with 100% oxygen Intubation Type: IV induction LMA: LMA inserted LMA Size: 4.0 Tube type: Oral Number of attempts: 1 Placement Confirmation: positive ETCO2 and breath sounds checked- equal and bilateral Tube secured with: Tape Dental Injury: Teeth and Oropharynx as per pre-operative assessment

## 2014-10-06 ENCOUNTER — Encounter (HOSPITAL_COMMUNITY): Payer: Self-pay | Admitting: Orthopedic Surgery

## 2014-10-06 NOTE — Anesthesia Postprocedure Evaluation (Signed)
  Anesthesia Post-op Note  Patient: [name]  Procedure(s) Performed: Procedure(s): HARDWARE REMOVAL (Right)  Patient Location: PACU  Anesthesia Type:General  Level of Consciousness: awake  Airway and Oxygen Therapy: Patient Spontanous Breathing  Post-op Pain: none  Post-op Assessment: Post-op Vital signs reviewed, Patient's Cardiovascular Status Stable, Respiratory Function Stable, Patent Airway, No signs of Nausea or vomiting and Pain level controlled  Post-op Vital Signs: Reviewed and stable  Last Vitals:  Filed Vitals:   10/05/14 1558  BP:   Pulse: 88  Temp:   Resp: 16    Complications: No apparent anesthesia complications

## 2014-10-06 NOTE — Op Note (Signed)
NAMEAIVA, [name] NO.:  [id number]  MEDICAL RECORD NO.:  [id number]  LOCATION:  [facility]                         FACILITY:  [facility]  PHYSICIAN:  Alta Corning, M.D.   DATE OF BIRTH:  [birth year]  DATE OF PROCEDURE:  10/05/2014 DATE OF DISCHARGE:  10/05/2014                              OPERATIVE REPORT   PREOPERATIVE DIAGNOSIS:  Retained screw from an intertrochanteric fracture that had penetrated the superior articular cartilage.  POSTOPERATIVE DIAGNOSIS:  Retained screw from an intertrochanteric fracture that had penetrated the superior articular cartilage.  PRINCIPAL PROCEDURE:  1.Removal of superior derotational screw from an intramedullary rod construct for intertrochanteric fracture. 2. Interpretation of multiple fluoroscopic images.  SURGEON:  Alta Corning, M.D.  ASSISTANT:  Gary Fleet, P.A.  ANESTHESIA:  General.  BRIEF HISTORY:  Ms. Zaccaria is a 80 year old female with history of significant complaints of right hip pain after a fall.  She was fixed with intertrochanteric nail and because of the complex nature of the fracture, a derotational screw was used.  She compressed and when she did, the compression screw compressed, but the derotational screw did not and ultimately with the level of compression, the derotational screw penetrated the articular cartilage.  Once this was happened, the patient was began having significant pain and at that point, we felt that we need to keep her nonweightbearing and we went ahead and got a CAT scan, which proved that this screw had penetrated, she was brought to the operating room urgently for screw removal.  DESCRIPTION OF PROCEDURE:  The patient was brought to the operating room.  After adequate level of anesthesia was obtained with general anesthetic, the patient was placed supine on the operating table.  She was then put back on the fracture table as we had done the surgery initially.  Incision was made  laterally with subcutaneous tissue down the level of the femur.  My thought process was to get a guidewire into the large screw, we were able to do this and this gave [place] a location of where the smaller screw was.  We were able to work some tissue out of the way with a very strong rongeur and then we were able to reverse the screwdriver into the large screw and then just pulled as straight out.  Once it was accomplished, the screw removed easily, the wound was irrigated, suctioned dry, closed in layers. Sterile compressive dressing was applied, and the patient was taken to the recovery room, she was noted to be in satisfactory condition. Estimated blood loss for the procedure was minimal.     Alta Corning, M.D.     Corliss Skains  D:  10/05/2014  T:  10/06/2014  Job:  [id number]

## 2015-01-07 ENCOUNTER — Other Ambulatory Visit (HOSPITAL_COMMUNITY): Payer: Self-pay | Admitting: Pulmonary Disease

## 2015-01-07 ENCOUNTER — Ambulatory Visit (HOSPITAL_COMMUNITY)
Admission: RE | Admit: 2015-01-07 | Discharge: 2015-01-07 | Disposition: A | Discharge status: Home / Self Care | Payer: PPO | Source: Ambulatory Visit | Attending: Pulmonary Disease | Admitting: Pulmonary Disease

## 2015-01-07 DIAGNOSIS — M79605 Pain in left leg: Secondary | ICD-10-CM

## 2015-01-20 NOTE — Patient Outreach (Signed)
[place] [facility]) Care Management  01/20/2015  [name] 1934/09/06 [id number]   Referral from HTA tier 4 list, assigned to Sherrin Daisy, [facility] for patient outreach.  Jenene Kauffmann L. Raford Brissett, [place] Care Management Assistant

## 2015-02-15 ENCOUNTER — Other Ambulatory Visit: Payer: Self-pay

## 2015-02-15 NOTE — Patient Outreach (Signed)
[place] [facility]) Care Management  02/15/2015  [name] [birth year] [id number]   Notification received from [name], RNCM to close patient case due to caregiver refusal of services.  [name], [place] Care Management Assistant

## 2015-02-15 NOTE — Patient Outreach (Signed)
[place] [facility]) Care Management  02/15/2015  [name] July 13, 1934 [id number]  SUBJECTIVE:  Telephone call to patient regarding health team advantage referral. Husband states patient unable to talk due to previous stroke.  Patient gave verbal authorization to speak with her husband, [name] regarding all medical information.  RNCM discussed and offered [place] Management services to patients husband for patient. Patients husband states patient had a stroke 6 years ago which has presently caused her to have slight limp and speech problems. Husband states patient fell in January 2016 and sustained broken right hip.  States patient has had surgery to hip twice.  Husband states patient has recovered well and is ambulating with walker.  Husband states patient has frequent follow up visits with orthopedic surgeon and has had extensive therapy.  Husband states patient is presently on her 3rd antibiotic course for cellulitis of left leg. Husband states patient is doing well and does not have any needs at this time. Husband states he is a retired Music therapist and patient is a retired Therapist, sports.   Husband verbally agreed to receive [facility] outreach letter and pamphlet for future reference. Husband states that he has handled patients care for the last 6 years without assistance and feels that they are able to manage at this time.   ASSESSMENT:  Health team advantage referral.  PLAN:  RNCM will refer patient [name] to close due to caregiver refusal of services.  RNCM will send patient [facility] outreach letter and pamphlet as discussed with husband. RNCM will notify patients primary MD of closure.   Quinn Plowman RN,BSN,CCM [place] Coordinator [phone]

## 2015-02-15 NOTE — Patient Outreach (Signed)
[place] [facility]) Care Management  02/15/2015  JACOYA CRESSY 25-Sep-1934 [id number]   Referral reassigned to Quinn Plowman, [facility] for patient outreach.  Tait Balistreri L. Keirstin Musil, [place] Care Management Assistant

## 2015-03-01 NOTE — Therapy (Signed)
[place], [place], [address] Phone: [phone]   Fax:  [phone]  Patient Details  Name: [name] MRN: [id number] Date of Birth: 1934-10-04 Referring Provider:  Melrose Nakayama, MD  Encounter Date: 03/01/2015   PHYSICAL THERAPY DISCHARGE SUMMARY  Visits from Start of Care: 5  Current functional level related to goals / functional outcomes: Patient has not returned since last visit in March    Remaining deficits: Unable to assess    Education / Equipment: N/A  Plan: Patient agrees to discharge.  Patient goals were not met. Patient is being discharged due to not returning since the last visit.  ?????       Deniece Ree PT, DPT [place] [address] [place], [place], [address] Phone: [phone]   Fax:  [phone]

## 2015-03-12 NOTE — Telephone Encounter (Signed)
Pt called RE missed appointment [name], [place] CLT [phone]

## 2015-06-21 DIAGNOSIS — I639 Cerebral infarction, unspecified: Secondary | ICD-10-CM | POA: Diagnosis not present

## 2015-06-21 DIAGNOSIS — I10 Essential (primary) hypertension: Secondary | ICD-10-CM | POA: Diagnosis not present

## 2015-06-21 DIAGNOSIS — E785 Hyperlipidemia, unspecified: Secondary | ICD-10-CM | POA: Diagnosis not present

## 2015-06-21 DIAGNOSIS — S72001D Fracture of unspecified part of neck of right femur, subsequent encounter for closed fracture with routine healing: Secondary | ICD-10-CM | POA: Diagnosis not present

## 2015-07-23 ENCOUNTER — Other Ambulatory Visit: Payer: Self-pay | Admitting: *Deleted

## 2015-07-23 NOTE — Patient Outreach (Signed)
[place] [facility]) Care Management  07/23/2015  MELIZA BIERCE Jan 21, 1935 [id number]   Health Team Advantage referral Tier 4 List:  Telephone call to patient; left message on voice mail requesting call back.  Plan: Will follow up. Screening appointment set.  Sherrin Daisy, RN BSN [place] Management Coordinator [facility] Care Management  [phone]

## 2015-07-26 ENCOUNTER — Other Ambulatory Visit: Payer: Self-pay | Admitting: *Deleted

## 2015-07-26 NOTE — Patient Outreach (Signed)
[place] [facility]) Care Management  07/26/2015  ANDRENA HOLDERNESS 1935-04-27 [id number]   High Risk referral: Telephone call to patient; call answered by spouse who states he takes all of patient's calls. States patient has had a stroke in the past and has some residual difficulty with language/communication.  HIPPA verification received from spouse/caregiver.  Spouse/caregiver was advised of reason for call and of [facility] care management services.   Caregiver states he is a retired Music therapist and his spouse(patient) is also retired from Corporate treasurer. States he has cared for patient for many years following her stroke. States currently she is doing well and has no case/disease management needs. States he takes her to all doctors' appointments; no problems getting prescriptions filled. States he manages patient's medications and patient is compliant with taking medications as ordered by her doctors.  Spouse/caregiver has declined [facility] care management services. States he will accept contact information and literature regarding [facility] services.  Plan:  Send MD closure letter. Send educational letter to patient with contact information. Send to care management assistant instructions to close case.  Sherrin Daisy, RN BSN [place] Management Coordinator [facility] Care Management  [phone]

## 2015-07-29 ENCOUNTER — Encounter: Payer: Self-pay | Admitting: *Deleted

## 2016-01-12 DIAGNOSIS — I69328 Other speech and language deficits following cerebral infarction: Secondary | ICD-10-CM | POA: Diagnosis not present

## 2016-01-12 DIAGNOSIS — S72001D Fracture of unspecified part of neck of right femur, subsequent encounter for closed fracture with routine healing: Secondary | ICD-10-CM | POA: Diagnosis not present

## 2016-01-12 DIAGNOSIS — E785 Hyperlipidemia, unspecified: Secondary | ICD-10-CM | POA: Diagnosis not present

## 2016-01-12 DIAGNOSIS — I10 Essential (primary) hypertension: Secondary | ICD-10-CM | POA: Diagnosis not present

## 2016-02-08 DIAGNOSIS — L820 Inflamed seborrheic keratosis: Secondary | ICD-10-CM | POA: Diagnosis not present

## 2016-02-08 DIAGNOSIS — D485 Neoplasm of uncertain behavior of skin: Secondary | ICD-10-CM | POA: Diagnosis not present

## 2016-02-08 DIAGNOSIS — L821 Other seborrheic keratosis: Secondary | ICD-10-CM | POA: Diagnosis not present

## 2016-02-08 DIAGNOSIS — D2272 Melanocytic nevi of left lower limb, including hip: Secondary | ICD-10-CM | POA: Diagnosis not present

## 2016-03-02 DIAGNOSIS — D485 Neoplasm of uncertain behavior of skin: Secondary | ICD-10-CM | POA: Diagnosis not present

## 2016-03-02 DIAGNOSIS — L988 Other specified disorders of the skin and subcutaneous tissue: Secondary | ICD-10-CM | POA: Diagnosis not present

## 2016-08-18 DIAGNOSIS — I69328 Other speech and language deficits following cerebral infarction: Secondary | ICD-10-CM | POA: Diagnosis not present

## 2016-08-18 DIAGNOSIS — I10 Essential (primary) hypertension: Secondary | ICD-10-CM | POA: Diagnosis not present

## 2017-03-22 DIAGNOSIS — Z0000 Encounter for general adult medical examination without abnormal findings: Secondary | ICD-10-CM | POA: Diagnosis not present

## 2017-03-27 DIAGNOSIS — I10 Essential (primary) hypertension: Secondary | ICD-10-CM | POA: Diagnosis not present

## 2017-03-27 DIAGNOSIS — I639 Cerebral infarction, unspecified: Secondary | ICD-10-CM | POA: Diagnosis not present

## 2017-03-27 DIAGNOSIS — E785 Hyperlipidemia, unspecified: Secondary | ICD-10-CM | POA: Diagnosis not present

## 2017-03-27 DIAGNOSIS — I69328 Other speech and language deficits following cerebral infarction: Secondary | ICD-10-CM | POA: Diagnosis not present

## 2017-08-28 ENCOUNTER — Other Ambulatory Visit (HOSPITAL_COMMUNITY): Payer: Self-pay | Admitting: Pulmonary Disease

## 2017-08-28 DIAGNOSIS — I10 Essential (primary) hypertension: Secondary | ICD-10-CM | POA: Diagnosis not present

## 2017-08-28 DIAGNOSIS — E785 Hyperlipidemia, unspecified: Secondary | ICD-10-CM | POA: Diagnosis not present

## 2017-08-28 DIAGNOSIS — I69328 Other speech and language deficits following cerebral infarction: Secondary | ICD-10-CM | POA: Diagnosis not present

## 2017-08-28 DIAGNOSIS — R1310 Dysphagia, unspecified: Secondary | ICD-10-CM

## 2017-08-28 DIAGNOSIS — I639 Cerebral infarction, unspecified: Secondary | ICD-10-CM | POA: Diagnosis not present

## 2017-08-29 ENCOUNTER — Encounter: Payer: Self-pay | Admitting: Gastroenterology

## 2017-08-30 ENCOUNTER — Ambulatory Visit (HOSPITAL_COMMUNITY): Payer: PPO

## 2017-09-11 ENCOUNTER — Ambulatory Visit (HOSPITAL_COMMUNITY)
Admission: RE | Admit: 2017-09-11 | Discharge: 2017-09-11 | Disposition: A | Discharge status: Home / Self Care | Payer: PPO | Source: Ambulatory Visit | Attending: Pulmonary Disease | Admitting: Pulmonary Disease

## 2017-09-11 DIAGNOSIS — R1310 Dysphagia, unspecified: Secondary | ICD-10-CM | POA: Diagnosis not present

## 2017-09-11 DIAGNOSIS — K449 Diaphragmatic hernia without obstruction or gangrene: Secondary | ICD-10-CM | POA: Insufficient documentation

## 2017-10-25 ENCOUNTER — Ambulatory Visit: Payer: PPO | Admitting: Gastroenterology

## 2017-12-13 DIAGNOSIS — H401111 Primary open-angle glaucoma, right eye, mild stage: Secondary | ICD-10-CM | POA: Diagnosis not present

## 2017-12-13 DIAGNOSIS — H401122 Primary open-angle glaucoma, left eye, moderate stage: Secondary | ICD-10-CM | POA: Diagnosis not present

## 2018-01-10 DIAGNOSIS — H401111 Primary open-angle glaucoma, right eye, mild stage: Secondary | ICD-10-CM | POA: Diagnosis not present

## 2018-01-10 DIAGNOSIS — H401122 Primary open-angle glaucoma, left eye, moderate stage: Secondary | ICD-10-CM | POA: Diagnosis not present

## 2018-04-17 ENCOUNTER — Emergency Department (HOSPITAL_COMMUNITY): Payer: PPO

## 2018-04-17 ENCOUNTER — Emergency Department (HOSPITAL_COMMUNITY)
Admission: EM | Admit: 2018-04-17 | Discharge: 2018-04-17 | Disposition: A | Discharge status: Home / Self Care | Payer: PPO | Attending: Emergency Medicine | Admitting: Emergency Medicine

## 2018-04-17 ENCOUNTER — Encounter (HOSPITAL_COMMUNITY): Payer: Self-pay | Admitting: Emergency Medicine

## 2018-04-17 ENCOUNTER — Other Ambulatory Visit: Payer: Self-pay

## 2018-04-17 DIAGNOSIS — Z87891 Personal history of nicotine dependence: Secondary | ICD-10-CM | POA: Diagnosis not present

## 2018-04-17 DIAGNOSIS — K828 Other specified diseases of gallbladder: Secondary | ICD-10-CM | POA: Diagnosis not present

## 2018-04-17 DIAGNOSIS — Z79899 Other long term (current) drug therapy: Secondary | ICD-10-CM | POA: Insufficient documentation

## 2018-04-17 DIAGNOSIS — Z8673 Personal history of transient ischemic attack (TIA), and cerebral infarction without residual deficits: Secondary | ICD-10-CM | POA: Diagnosis not present

## 2018-04-17 DIAGNOSIS — R000 Tachycardia, unspecified: Secondary | ICD-10-CM | POA: Diagnosis not present

## 2018-04-17 DIAGNOSIS — Z7902 Long term (current) use of antithrombotics/antiplatelets: Secondary | ICD-10-CM | POA: Insufficient documentation

## 2018-04-17 DIAGNOSIS — I10 Essential (primary) hypertension: Secondary | ICD-10-CM | POA: Insufficient documentation

## 2018-04-17 DIAGNOSIS — R5381 Other malaise: Secondary | ICD-10-CM | POA: Diagnosis not present

## 2018-04-17 DIAGNOSIS — N390 Urinary tract infection, site not specified: Secondary | ICD-10-CM | POA: Diagnosis not present

## 2018-04-17 DIAGNOSIS — E860 Dehydration: Secondary | ICD-10-CM

## 2018-04-17 DIAGNOSIS — R1010 Upper abdominal pain, unspecified: Secondary | ICD-10-CM | POA: Diagnosis present

## 2018-04-17 DIAGNOSIS — R627 Adult failure to thrive: Secondary | ICD-10-CM | POA: Diagnosis not present

## 2018-04-17 DIAGNOSIS — N3000 Acute cystitis without hematuria: Secondary | ICD-10-CM

## 2018-04-17 LAB — URINALYSIS, ROUTINE W REFLEX MICROSCOPIC
Bilirubin Urine: NEGATIVE
Glucose, UA: NEGATIVE mg/dL
Ketones, ur: 5 mg/dL — AB
Nitrite: NEGATIVE
Protein, ur: NEGATIVE mg/dL
Specific Gravity, Urine: 1.015 (ref 1.005–1.030)
WBC, UA: >50 WBC/hpf — ABNORMAL HIGH (ref 0–5)
pH: 5 (ref 5.0–8.0)

## 2018-04-17 LAB — CBC WITH DIFFERENTIAL/PLATELET
ABS IMMATURE GRANULOCYTES: 0.08 10*3/uL — AB (ref 0.00–0.07)
Basophils Absolute: 0.1 10*3/uL (ref 0.0–0.1)
Basophils Relative: 1 %
Eosinophils Absolute: 0.1 10*3/uL (ref 0.0–0.5)
Eosinophils Relative: 0 %
HCT: 39 % (ref 36.0–46.0)
Hemoglobin: 11.2 g/dL — ABNORMAL LOW (ref 12.0–15.0)
Immature Granulocytes: 1 %
Lymphocytes Relative: 6 %
Lymphs Abs: 1.1 10*3/uL (ref 0.7–4.0)
MCH: 23.4 pg — ABNORMAL LOW (ref 26.0–34.0)
MCHC: 28.7 g/dL — ABNORMAL LOW (ref 30.0–36.0)
MCV: 81.6 fL (ref 80.0–100.0)
MONOS PCT: 6 %
Monocytes Absolute: 1 10*3/uL (ref 0.1–1.0)
NEUTROS ABS: 15.4 10*3/uL — AB (ref 1.7–7.7)
NEUTROS PCT: 86 %
PLATELETS: 636 10*3/uL — AB (ref 150–400)
RBC: 4.78 MIL/uL (ref 3.87–5.11)
RDW: 14.3 % (ref 11.5–15.5)
WBC: 17.7 10*3/uL — ABNORMAL HIGH (ref 4.0–10.5)
nRBC: 0 % (ref 0.0–0.2)

## 2018-04-17 LAB — COMPREHENSIVE METABOLIC PANEL WITH GFR
ALT: 12 U/L (ref 0–44)
AST: 17 U/L (ref 15–41)
Albumin: 3.5 g/dL (ref 3.5–5.0)
Alkaline Phosphatase: 78 U/L (ref 38–126)
Anion gap: 14 (ref 5–15)
BUN: 51 mg/dL — ABNORMAL HIGH (ref 8–23)
CO2: 24 mmol/L (ref 22–32)
Calcium: 10 mg/dL (ref 8.9–10.3)
Chloride: 108 mmol/L (ref 98–111)
Creatinine, Ser: 2.07 mg/dL — ABNORMAL HIGH (ref 0.44–1.00)
GFR calc Af Amer: 25 mL/min — ABNORMAL LOW
GFR calc non Af Amer: 21 mL/min — ABNORMAL LOW
Glucose, Bld: 131 mg/dL — ABNORMAL HIGH (ref 70–99)
Potassium: 4 mmol/L (ref 3.5–5.1)
Sodium: 146 mmol/L — ABNORMAL HIGH (ref 135–145)
Total Bilirubin: 0.9 mg/dL (ref 0.3–1.2)
Total Protein: 8.4 g/dL — ABNORMAL HIGH (ref 6.5–8.1)

## 2018-04-17 LAB — BRAIN NATRIURETIC PEPTIDE: B Natriuretic Peptide: 31 pg/mL (ref 0.0–100.0)

## 2018-04-17 LAB — TROPONIN I: Troponin I: <0.03 ng/mL

## 2018-04-17 LAB — LIPASE, BLOOD: Lipase: 36 U/L (ref 11–51)

## 2018-04-17 MED ORDER — SODIUM CHLORIDE 0.9 % IV BOLUS
1000.0000 mL | Freq: Once | INTRAVENOUS | Status: AC
  Administered 2018-04-17: 1000 mL via INTRAVENOUS

## 2018-04-17 MED ORDER — CEPHALEXIN 500 MG PO CAPS: 500.0000 mg | ORAL_CAPSULE | Freq: Two times a day (BID) | ORAL | 0 refills | Status: AC

## 2018-04-17 MED ORDER — SODIUM CHLORIDE 0.9 % IV SOLN
1.0000 g | Freq: Once | INTRAVENOUS | Status: AC
  Administered 2018-04-17: 1 g via INTRAVENOUS
  Filled 2018-04-17: qty 10

## 2018-04-17 NOTE — ED Triage Notes (Addendum)
Per EMS, pt from home. Pt's husband called out for failure to thrive and depression x 2 days. He said pt will not eat/drink much. Per EMS, husband told pt he was considering selling the house and since then has been depressed. Pt denies HI/SI. EMS states, pt will hold her breath to get a low oxygen reading, but as soon as she releases her oxygen will go up. Pt will not answer questions, just nodding yes or no.

## 2018-04-17 NOTE — ED Provider Notes (Signed)
[facility] EMERGENCY DEPARTMENT Provider Note   CSN: [id number] Arrival date & time: 04/17/18  1138     History   Chief Complaint Chief Complaint  Patient presents with  . Failure To Thrive    HPI [name] is a 83 y.o. female.  HPI Patient presents with her husband who provides much of the HPI. Patient has a prior stroke, with a subsequent apraxia, minimal speech, slow to respond. They present due to worsening generalized weakness, anorexia, and ongoing nausea, vomiting, abdominal pain. Onset may have been more than 1 week ago, but symptoms been much more noticeable over the past 2 or 3 days. Abdominal pain is in the upper abdomen, flank, inconsistent, difficult to specify given the patient's speech difficulty. No reported fever, no syncope. The patient herself nods to simple questions, answers some questions briefly, verbally, but inconsistently, and not reliably. Level 5 caveat secondary to cognitive situation.   Past Medical History:  Diagnosis Date  . 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 Constipation   . Degenerative joint disease of knee, right    right knee  . Hearing impaired person    wears hearing aids  . High cholesterol   . History of blood transfusion   . Hx: UTI (urinary tract infection)   . Hypertension   . Kidney stones   . Stroke [facility])    speech is effected slightly , right sided weakness  (has complete obstruction left carotid - per husband)    Patient Active Problem List   Diagnosis Date Noted  . Painful orthopaedic hardware ([place]) 10/05/2014  . Right hip pain 10/05/2014  . Expressive aphasia   . Acute blood loss anemia   . Acute renal failure syndrome ([place])   . Closed right hip fracture ([place]) 06/12/2014  . Hip fracture ([place]) 06/12/2014  . UTI (urinary tract infection) 06/12/2014  . Fall   . UTI (lower urinary tract infection)   . Vasovagal syncope 05/15/2011  . S/P stroke due to cerebrovascular disease 05/15/2011  .  Hypertension 05/15/2011  . Urinary tract infection 05/15/2011  . LOWER LEG, ARTHRITIS, DEGEN./OSTEO 01/21/2009  . JOINT EFFUSION, RIGHT KNEE 01/21/2009  . KNEE PAIN 01/21/2009    Past Surgical History:  Procedure Laterality Date  . APPENDECTOMY    . HARDWARE REMOVAL Right 10/05/2014   Procedure: HARDWARE REMOVAL;  Surgeon: Dorna Leitz, MD;  Location: [facility];  Service: Orthopedics;  Laterality: Right;  . INTRAMEDULLARY (IM) NAIL INTERTROCHANTERIC Right 06/12/2014   Procedure: INTRAMEDULLARY (IM) NAIL INTERTROCHANTRIC RIGHT HIP;  Surgeon: Alta Corning, MD;  Location: [facility];  Service: Orthopedics;  Laterality: Right;  . kidney stones    . LITHOTRIPSY    . PEG PLACEMENT     and removal  . PEG TUBE REMOVAL    . RADIOLOGY WITH ANESTHESIA  04/22/2012   Procedure: RADIOLOGY WITH ANESTHESIA;  Surgeon: Rob Hickman, MD;  Location: [facility];  Service: Radiology;  Laterality: N/A;  VERTEBRAL ARTERY STENT PLACEMENT AND CEREBRAL ANGIOGRAM  . TONSILLECTOMY    . TYMPANOPLASTY       OB History   None      Home Medications    Prior to Admission medications   Medication Sig Start Date End Date Taking? Authorizing Provider  clopidogrel (PLAVIX) 75 MG tablet Take 1 tablet (75 mg total) by mouth daily. 07/13/14   Reyne Dumas, MD  docusate sodium 100 MG CAPS Take 100 mg by mouth 2 (two) times daily. 06/16/14   Abrol,  Ascencion Dike, MD  escitalopram (LEXAPRO) 10 MG tablet Take 1 tablet by mouth daily. 01/30/18   [provider]  ferrous sulfate 325 (65 FE) MG tablet Take 1 tablet (325 mg total) by mouth 2 (two) times daily with a meal. Patient not taking: Reported on 10/02/2014 06/16/14   Reyne Dumas, MD  HYDROcodone-acetaminophen (NORCO/VICODIN) 5-325 MG per tablet Take 1-2 tablets by mouth every 6 (six) hours as needed for moderate pain. Patient not taking: Reported on 10/02/2014 06/16/14   Reyne Dumas, MD  latanoprost (XALATAN) 0.005 % ophthalmic solution Place 1 drop into both eyes at bedtime.  04/11/18   [provider]  lisinopril-hydrochlorothiazide (PRINZIDE,ZESTORETIC) 20-12.5 MG per tablet Take 1 tablet by mouth daily.      [provider]  Multiple Vitamins-Minerals (CENTRUM ULTRA WOMENS) TABS Take 1 tablet by mouth daily.      [provider]  naproxen sodium (ANAPROX) 220 MG tablet Take 440 mg by mouth 2 (two) times daily with a meal.    [provider]  polyethylene glycol (MIRALAX / GLYCOLAX) packet Take 17 g by mouth daily. 06/16/14   Reyne Dumas, MD  rosuvastatin (CRESTOR) 20 MG tablet Take 20 mg by mouth daily.    [provider]  senna (SENOKOT) 8.6 MG tablet Take 1 tablet by mouth daily.      [provider]    Family History Family History  Problem Relation Age of Onset  . CVA Father     Social History Social History   Tobacco Use  . Smoking status: Former Smoker    Last attempt to quit: 10/02/1983    Years since quitting: 34.5  . Smokeless tobacco: Never Used  Substance Use Topics  . Alcohol use: Yes    Comment: occasional wine  . Drug use: No     Allergies   Patient has no known allergies.   Review of Systems Review of Systems  Unable to perform ROS: Patient nonverbal     Physical Exam Updated Vital Signs BP 112/82   Pulse 100   Temp 98.5 F (36.9 C) (Oral)   Resp 17   Wt 82.5 kg   SpO2 94%   BMI 33.27 kg/m   Physical Exam  Constitutional: She has a sickly appearance. No distress.  HENT:  Head: Normocephalic and atraumatic.  Mouth/Throat: Mucous membranes are dry.  Eyes: Conjunctivae and EOM are normal.  Cardiovascular: Normal rate and regular rhythm.  Pulmonary/Chest: Effort normal and breath sounds normal. No stridor. No respiratory distress.  Abdominal: She exhibits no distension. There is tenderness. There is guarding.    Musculoskeletal: She exhibits no edema.  Neurological: She displays atrophy. She displays no tremor.  No gross facial asymmetry, speech, when spoken  as brief, clear, but not necessarily appropriate. She moves all tremors spontaneously, though minimally.   Skin: Skin is warm and dry.  Psychiatric: She is slowed and withdrawn. Cognition and memory are impaired.  Nursing note and vitals reviewed.    ED Treatments / Results  Labs (all labs ordered are listed, but only abnormal results are displayed) Labs Reviewed  COMPREHENSIVE METABOLIC PANEL - Abnormal; Notable for the following components:      Result Value   Sodium 146 (*)    Glucose, Bld 131 (*)    BUN 51 (*)    Creatinine, Ser 2.07 (*)    Total Protein 8.4 (*)    GFR calc non Af Amer 21 (*)    GFR calc Af Amer 25 (*)  All other components within normal limits  CBC WITH DIFFERENTIAL/PLATELET - Abnormal; Notable for the following components:   WBC 17.7 (*)    Hemoglobin 11.2 (*)    MCH 23.4 (*)    MCHC 28.7 (*)    Platelets 636 (*)    Neutro Abs 15.4 (*)    Abs Immature Granulocytes 0.08 (*)    All other components within normal limits  URINALYSIS, ROUTINE W REFLEX MICROSCOPIC - Abnormal; Notable for the following components:   APPearance CLOUDY (*)    Hgb urine dipstick SMALL (*)    Ketones, ur 5 (*)    Leukocytes, UA LARGE (*)    WBC, UA >50 (*)    Bacteria, UA RARE (*)    All other components within normal limits  LIPASE, BLOOD  BRAIN NATRIURETIC PEPTIDE  TROPONIN I    EKG EKG Interpretation  Date/Time:  Wednesday April 17 2018 11:45:56 EST Ventricular Rate:  107 PR Interval:    QRS Duration: 82 QT Interval:  344 QTC Calculation: 459 R Axis:   -8 Text Interpretation:  Sinus rhythm Artifact T wave abnormality Abnormal ekg Confirmed by Carmin Muskrat [phone]) on 04/17/2018 12:54:49 PM   Radiology Dg Chest 2 View  Result Date: 04/17/2018 CLINICAL DATA:  Failure to thrive and depression for 2 days EXAM: CHEST - 2 VIEW COMPARISON:  06/12/2014 FINDINGS: Chronic cardiomegaly. Mild aortic tortuosity. There is no edema, consolidation, effusion, or  pneumothorax. 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 Extent of chondroid matrix measures nearly 9 cm in length. Chronic lower thoracic compression fracture, likely T12, with advanced height loss. IMPRESSION: No acute finding when compared to priors. Chronic cardiomegaly. Electronically Signed   By: Monte Fantasia M.D.   On: 04/17/2018 13:27   US Abdomen Limited  Result Date: 04/17/2018 CLINICAL DATA:  Right upper quadrant with nausea vomiting EXAM: ULTRASOUND ABDOMEN LIMITED RIGHT UPPER QUADRANT COMPARISON:  None. FINDINGS: Gallbladder: No gallstones or wall thickening visualized. No sonographic Murphy sign noted by sonographer. Gallbladder is distended however this could be within normal limits. Common bile duct: Diameter: 3 mm Liver: 1 cm right hepatic cyst. No liver mass. Portal vein is patent on color Doppler imaging with normal direction of blood flow towards the liver. IMPRESSION: Distended gallbladder without gallstones or gallbladder thickening. Negative for biliary dilatation. Electronically Signed   By: Franchot Gallo M.D.   On: 04/17/2018 15:43    Procedures Procedures (including critical care time)  Medications Ordered in ED Medications  sodium chloride 0.9 % bolus 1,000 mL (1,000 mLs Intravenous New Bag/Given 04/17/18 1407)  cefTRIAXone (ROCEPHIN) 1 g in sodium chloride 0.9 % 100 mL IVPB (1 g Intravenous New Bag/Given 04/17/18 1431)     Initial Impression / Assessment and Plan / ED Course  I have reviewed the triage vital signs and the nursing notes.  Pertinent labs & imaging results that were available during my care of the patient were reviewed by me and considered in my medical decision making (see chart for details).     Initial labs notable for mild hypernatremia, elevated creatinine beyond baseline, with a value greater than 2, baseline 1.5. Patient remains hemodynamically  unremarkable with mild tachycardia, will receive fluid resuscitation.   3:54 PM Any labs unremarkable, ultrasound of the right upper quadrant demonstrates distended gallbladder, no stones, no wall thickening, and with no hepatic enzyme changes, no fever, low suspicion for occult cholecystitis, cholangitis. UA c/w likely UTI,  rocephin provided.  Patient has improved with fluid resuscitation, without other complaints, is appropriate for discharge with outpatient follow-up with your physician.  Final Clinical Impressions(s) / ED Diagnoses  Dehydration UTI   Carmin Muskrat, MD 04/17/18 1556

## 2018-04-17 NOTE — Discharge Instructions (Signed)
As discussed, your evaluation today has been largely reassuring.  But, it is important that you monitor your condition carefully, and do not hesitate to return to the ED if you develop new, or concerning changes in your condition. ? ?Otherwise, please follow-up with your physician for appropriate ongoing care. ? ?

## 2018-04-18 ENCOUNTER — Other Ambulatory Visit: Payer: Self-pay

## 2018-04-18 NOTE — Patient Outreach (Signed)
[place] [facility]) Care Management  04/18/2018  [name] Oct 05, 1934 [id number]   Telephone Screen  Referral Date: 04/17/18 Referral Source: Nurse Call Center Referral Reason: " caller states wife won't eat and he thinks she's depressed, she just sits there watching TV, had stroke 9 yrs ago, been vomiting and having abd pain" Insurance: HTA   Outreach attempt # 1 to patient/spouse. Spoke with spouse(DPR on file). Patient does not talk on phone given history of stroke and impaired speech. Spouse states that info he received from nurse at call center was very helpful. He took the nurse's advice and called EMS. Patient was transported to [facility] ED and evaluated. Spouse states that patient was treated for dehydration and a UTI. She is currently on meds to help. Spouse voices that he can already see a slight improvement in patient's condition. 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 He is able to manage patient's care and denies any further RN CM needs or concerns at this time. Spouse was very appreciative of follow up call. He is aware that he can call 24hr Nurse Line at any time for any future needs or concerns.       Plan: RN CM will close case at this time as no further interventions needed.    Enzo Montgomery, RN,BSN,CCM [place] Management Telephonic Care Management Coordinator Direct Phone: [phone] Toll Free: [phone] Fax: [phone]

## 2018-04-26 ENCOUNTER — Emergency Department (HOSPITAL_COMMUNITY): Payer: PPO

## 2018-04-26 ENCOUNTER — Inpatient Hospital Stay (HOSPITAL_COMMUNITY)
Admission: EM | Admit: 2018-04-26 | Discharge: 2018-04-30 | DRG: 682 | Disposition: A | Discharge status: Home Health | Payer: PPO | Attending: Pulmonary Disease | Admitting: Pulmonary Disease

## 2018-04-26 ENCOUNTER — Encounter (HOSPITAL_COMMUNITY): Payer: Self-pay | Admitting: Emergency Medicine

## 2018-04-26 ENCOUNTER — Other Ambulatory Visit: Payer: Self-pay

## 2018-04-26 DIAGNOSIS — Z79899 Other long term (current) drug therapy: Secondary | ICD-10-CM

## 2018-04-26 DIAGNOSIS — I959 Hypotension, unspecified: Secondary | ICD-10-CM | POA: Diagnosis not present

## 2018-04-26 DIAGNOSIS — H9190 Unspecified hearing loss, unspecified ear: Secondary | ICD-10-CM | POA: Diagnosis not present

## 2018-04-26 DIAGNOSIS — K264 Chronic or unspecified duodenal ulcer with hemorrhage: Secondary | ICD-10-CM | POA: Diagnosis present

## 2018-04-26 DIAGNOSIS — I679 Cerebrovascular disease, unspecified: Secondary | ICD-10-CM

## 2018-04-26 DIAGNOSIS — R404 Transient alteration of awareness: Secondary | ICD-10-CM

## 2018-04-26 DIAGNOSIS — E785 Hyperlipidemia, unspecified: Secondary | ICD-10-CM | POA: Diagnosis not present

## 2018-04-26 DIAGNOSIS — R4182 Altered mental status, unspecified: Secondary | ICD-10-CM | POA: Diagnosis present

## 2018-04-26 DIAGNOSIS — N179 Acute kidney failure, unspecified: Principal | ICD-10-CM | POA: Diagnosis present

## 2018-04-26 DIAGNOSIS — Z8673 Personal history of transient ischemic attack (TIA), and cerebral infarction without residual deficits: Secondary | ICD-10-CM | POA: Diagnosis not present

## 2018-04-26 DIAGNOSIS — D638 Anemia in other chronic diseases classified elsewhere: Secondary | ICD-10-CM | POA: Diagnosis not present

## 2018-04-26 DIAGNOSIS — D649 Anemia, unspecified: Secondary | ICD-10-CM | POA: Diagnosis not present

## 2018-04-26 DIAGNOSIS — K449 Diaphragmatic hernia without obstruction or gangrene: Secondary | ICD-10-CM | POA: Diagnosis present

## 2018-04-26 DIAGNOSIS — K3189 Other diseases of stomach and duodenum: Secondary | ICD-10-CM | POA: Diagnosis not present

## 2018-04-26 DIAGNOSIS — K269 Duodenal ulcer, unspecified as acute or chronic, without hemorrhage or perforation: Secondary | ICD-10-CM

## 2018-04-26 DIAGNOSIS — Z87891 Personal history of nicotine dependence: Secondary | ICD-10-CM | POA: Diagnosis not present

## 2018-04-26 DIAGNOSIS — D62 Acute posthemorrhagic anemia: Secondary | ICD-10-CM | POA: Diagnosis present

## 2018-04-26 DIAGNOSIS — E860 Dehydration: Secondary | ICD-10-CM | POA: Diagnosis not present

## 2018-04-26 DIAGNOSIS — Z974 Presence of external hearing-aid: Secondary | ICD-10-CM | POA: Diagnosis not present

## 2018-04-26 DIAGNOSIS — I10 Essential (primary) hypertension: Secondary | ICD-10-CM | POA: Diagnosis not present

## 2018-04-26 DIAGNOSIS — K922 Gastrointestinal hemorrhage, unspecified: Secondary | ICD-10-CM | POA: Diagnosis not present

## 2018-04-26 DIAGNOSIS — Z6824 Body mass index (BMI) 24.0-24.9, adult: Secondary | ICD-10-CM | POA: Diagnosis not present

## 2018-04-26 DIAGNOSIS — D72829 Elevated white blood cell count, unspecified: Secondary | ICD-10-CM | POA: Diagnosis not present

## 2018-04-26 DIAGNOSIS — N390 Urinary tract infection, site not specified: Secondary | ICD-10-CM | POA: Diagnosis present

## 2018-04-26 DIAGNOSIS — K222 Esophageal obstruction: Secondary | ICD-10-CM | POA: Diagnosis not present

## 2018-04-26 DIAGNOSIS — Z7902 Long term (current) use of antithrombotics/antiplatelets: Secondary | ICD-10-CM

## 2018-04-26 DIAGNOSIS — J9811 Atelectasis: Secondary | ICD-10-CM | POA: Diagnosis not present

## 2018-04-26 DIAGNOSIS — R0689 Other abnormalities of breathing: Secondary | ICD-10-CM | POA: Diagnosis not present

## 2018-04-26 DIAGNOSIS — E7800 Pure hypercholesterolemia, unspecified: Secondary | ICD-10-CM | POA: Diagnosis present

## 2018-04-26 DIAGNOSIS — E876 Hypokalemia: Secondary | ICD-10-CM | POA: Diagnosis not present

## 2018-04-26 DIAGNOSIS — E440 Moderate protein-calorie malnutrition: Secondary | ICD-10-CM | POA: Diagnosis not present

## 2018-04-26 LAB — I-STAT TROPONIN, ED: TROPONIN I, POC: 0.01 ng/mL (ref 0.00–0.08)

## 2018-04-26 LAB — CBC
HCT: 30.5 % — ABNORMAL LOW (ref 36.0–46.0)
Hemoglobin: 8.8 g/dL — ABNORMAL LOW (ref 12.0–15.0)
MCH: 23.5 pg — AB (ref 26.0–34.0)
MCHC: 28.9 g/dL — ABNORMAL LOW (ref 30.0–36.0)
MCV: 81.3 fL (ref 80.0–100.0)
NRBC: 0 % (ref 0.0–0.2)
PLATELETS: 470 10*3/uL — AB (ref 150–400)
RBC: 3.75 MIL/uL — AB (ref 3.87–5.11)
RDW: 14.5 % (ref 11.5–15.5)
WBC: 12.7 10*3/uL — ABNORMAL HIGH (ref 4.0–10.5)

## 2018-04-26 LAB — URINALYSIS, ROUTINE W REFLEX MICROSCOPIC
Bilirubin Urine: NEGATIVE
Glucose, UA: NEGATIVE mg/dL
Hgb urine dipstick: NEGATIVE
KETONES UR: NEGATIVE mg/dL
NITRITE: NEGATIVE
PH: 5 (ref 5.0–8.0)
Protein, ur: NEGATIVE mg/dL
Specific Gravity, Urine: 1.017 (ref 1.005–1.030)
WBC, UA: >50 WBC/hpf — ABNORMAL HIGH (ref 0–5)

## 2018-04-26 LAB — COMPREHENSIVE METABOLIC PANEL
ALT: 10 U/L (ref 0–44)
AST: 12 U/L — AB (ref 15–41)
Albumin: 2.7 g/dL — ABNORMAL LOW (ref 3.5–5.0)
Alkaline Phosphatase: 58 U/L (ref 38–126)
Anion gap: 11 (ref 5–15)
BUN: 51 mg/dL — ABNORMAL HIGH (ref 8–23)
CHLORIDE: 106 mmol/L (ref 98–111)
CO2: 25 mmol/L (ref 22–32)
Calcium: 9.2 mg/dL (ref 8.9–10.3)
Creatinine, Ser: 2.29 mg/dL — ABNORMAL HIGH (ref 0.44–1.00)
GFR, EST AFRICAN AMERICAN: 22 mL/min — AB (ref >60)
GFR, EST NON AFRICAN AMERICAN: 19 mL/min — AB (ref >60)
Glucose, Bld: 105 mg/dL — ABNORMAL HIGH (ref 70–99)
POTASSIUM: 3.4 mmol/L — AB (ref 3.5–5.1)
Sodium: 142 mmol/L (ref 135–145)
Total Bilirubin: 1 mg/dL (ref 0.3–1.2)
Total Protein: 6.8 g/dL (ref 6.5–8.1)

## 2018-04-26 LAB — I-STAT CG4 LACTIC ACID, ED: LACTIC ACID, VENOUS: 1.1 mmol/L (ref 0.5–1.9)

## 2018-04-26 LAB — LIPASE, BLOOD: Lipase: 37 U/L (ref 11–51)

## 2018-04-26 LAB — TSH: TSH: 0.732 u[IU]/mL (ref 0.350–4.500)

## 2018-04-26 MED ORDER — ADULT MULTIVITAMIN W/MINERALS CH
ORAL_TABLET | Freq: Every day | ORAL | Status: DC
  Administered 2018-04-26 – 2018-04-30 (×4): 1 via ORAL
  Filled 2018-04-26 (×4): qty 1

## 2018-04-26 MED ORDER — CLOPIDOGREL BISULFATE 75 MG PO TABS
75.0000 mg | ORAL_TABLET | Freq: Every day | ORAL | Status: DC
  Administered 2018-04-26 – 2018-04-27 (×2): 75 mg via ORAL
  Filled 2018-04-26 (×2): qty 1

## 2018-04-26 MED ORDER — SODIUM CHLORIDE 0.9 % IV BOLUS
500.0000 mL | Freq: Once | INTRAVENOUS | Status: AC
  Administered 2018-04-26: 500 mL via INTRAVENOUS

## 2018-04-26 MED ORDER — ONDANSETRON HCL 4 MG/2ML IJ SOLN
4.0000 mg | Freq: Four times a day (QID) | INTRAMUSCULAR | Status: DC | PRN
  Administered 2018-04-27: 4 mg via INTRAVENOUS
  Filled 2018-04-26: qty 2

## 2018-04-26 MED ORDER — LATANOPROST 0.005 % OP SOLN
1.0000 [drp] | Freq: Every day | OPHTHALMIC | Status: DC
  Administered 2018-04-26 – 2018-04-29 (×4): 1 [drp] via OPHTHALMIC
  Filled 2018-04-26: qty 2.5

## 2018-04-26 MED ORDER — SENNA 8.6 MG PO TABS
2.0000 | ORAL_TABLET | Freq: Every day | ORAL | Status: DC
  Administered 2018-04-26: 17.2 mg via ORAL
  Filled 2018-04-26 (×2): qty 2

## 2018-04-26 MED ORDER — TRAZODONE HCL 50 MG PO TABS: 25.0000 mg | ORAL_TABLET | Freq: Every evening | ORAL | Status: DC | PRN

## 2018-04-26 MED ORDER — SODIUM CHLORIDE 0.9 % IV BOLUS
1000.0000 mL | Freq: Once | INTRAVENOUS | Status: AC
  Administered 2018-04-26: 1000 mL via INTRAVENOUS

## 2018-04-26 MED ORDER — SODIUM CHLORIDE 0.9 % IV SOLN
1.0000 g | Freq: Once | INTRAVENOUS | Status: AC
  Administered 2018-04-26: 1 g via INTRAVENOUS
  Filled 2018-04-26: qty 10

## 2018-04-26 MED ORDER — ACETAMINOPHEN 650 MG RE SUPP: 650.0000 mg | Freq: Four times a day (QID) | RECTAL | Status: DC | PRN

## 2018-04-26 MED ORDER — ACETAMINOPHEN 325 MG PO TABS
650.0000 mg | ORAL_TABLET | Freq: Four times a day (QID) | ORAL | Status: DC | PRN
  Administered 2018-04-28: 650 mg via ORAL
  Filled 2018-04-26: qty 2

## 2018-04-26 MED ORDER — ROSUVASTATIN CALCIUM 10 MG PO TABS
10.0000 mg | ORAL_TABLET | Freq: Every day | ORAL | Status: DC
  Administered 2018-04-26 – 2018-04-29 (×4): 10 mg via ORAL
  Filled 2018-04-26 (×4): qty 1

## 2018-04-26 MED ORDER — PANTOPRAZOLE SODIUM 40 MG PO TBEC
40.0000 mg | DELAYED_RELEASE_TABLET | Freq: Two times a day (BID) | ORAL | Status: DC
  Administered 2018-04-26 – 2018-04-27 (×3): 40 mg via ORAL
  Filled 2018-04-26 (×3): qty 1

## 2018-04-26 MED ORDER — SENNA 8.6 MG PO TABS
2.0000 | ORAL_TABLET | Freq: Every day | ORAL | Status: DC
  Filled 2018-04-26 (×3): qty 2

## 2018-04-26 MED ORDER — POTASSIUM CHLORIDE IN NACL 40-0.9 MEQ/L-% IV SOLN
INTRAVENOUS | Status: DC
  Administered 2018-04-26 – 2018-04-27 (×2): 60 mL/h via INTRAVENOUS

## 2018-04-26 MED ORDER — ONDANSETRON HCL 4 MG PO TABS: 4.0000 mg | ORAL_TABLET | Freq: Four times a day (QID) | ORAL | Status: DC | PRN

## 2018-04-26 NOTE — H&P (Addendum)
History and Physical  [name] RKY:[id number] DOB: August 11, 1934 DOA: 04/26/2018  Referring physician: Sedonia Small MD  PCP: Sinda Du, MD   Chief Complaint: Altered mental status   HPI: [name] is a 83 y.o. female with cerebrovascular disease s/p CVA in 2016 has apparently been having a slow decline over the past 3 months according to husband.  The patient has increasing weakness and increasing inability to participate and help out with her  ADLs.  She was brought to the ED on 11/13 and diagnosed with dehydration and a UTI.  She was given IV fluids and felt much better.  She was given antibiotics and sent home.  She completed her antibiotics to completion but continued to be weak and decline at home.    This morning the patient's husband says that she was poorly arousable and this is not her baseline. She was difficult to wake up and has remained lethargic.  There have been no new neurological changes.   ED Course: The patient was noted to be dehydrated with a marked bump in creatinine to 2.29 where reported baseline was 1.5  The patient was noted to have a WBC of 12.7 and Hg was 8.8 which is down from 11.2 where she was seen in ED on 11/13.  Husband denies black or bloody stools.  The patient was also noted to have persistent infected appearing urine and was started on IV ceftriaxone by ED team.  Pt is being admitted for further management.     Review of Systems: Unable to obtain due to patient's condition.   Past Medical History:  Diagnosis Date  . 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 Constipation   . Degenerative joint disease of knee, right    right knee  . Hearing impaired person    wears hearing aids  . High cholesterol   . History of blood transfusion   . Hx: UTI (urinary tract infection)   . Hypertension   . Kidney stones   . Stroke [facility])    speech is effected slightly , right sided weakness  (has complete obstruction left carotid - per husband)    Past Surgical History:  Procedure Laterality Date  . APPENDECTOMY    . HARDWARE REMOVAL Right 10/05/2014   Procedure: HARDWARE REMOVAL;  Surgeon: Dorna Leitz, MD;  Location: [place];  Service: Orthopedics;  Laterality: Right;  . INTRAMEDULLARY (IM) NAIL INTERTROCHANTERIC Right 06/12/2014   Procedure: INTRAMEDULLARY (IM) NAIL INTERTROCHANTRIC RIGHT HIP;  Surgeon: Alta Corning, MD;  Location: [place];  Service: Orthopedics;  Laterality: Right;  . kidney stones    . LITHOTRIPSY    . PEG PLACEMENT     and removal  . PEG TUBE REMOVAL    . RADIOLOGY WITH ANESTHESIA  04/22/2012   Procedure: RADIOLOGY WITH ANESTHESIA;  Surgeon: Rob Hickman, MD;  Location: [place];  Service: Radiology;  Laterality: N/A;  VERTEBRAL ARTERY STENT PLACEMENT AND CEREBRAL ANGIOGRAM  . TONSILLECTOMY    . TYMPANOPLASTY     Social History:  reports that she quit smoking about 34 years ago. She has never used smokeless tobacco. She reports that she drinks alcohol. She reports that she does not use drugs.  No Known Allergies  Family History  Problem Relation Age of Onset  . CVA Father     Prior to Admission medications   Medication Sig Start Date End Date Taking? Authorizing Provider  clopidogrel (PLAVIX) 75 MG tablet Take 1 tablet (75 mg total)  by mouth daily. 07/13/14  Yes Reyne Dumas, MD  latanoprost (XALATAN) 0.005 % ophthalmic solution Place 1 drop into both eyes at bedtime. 04/11/18  Yes [provider]  lisinopril-hydrochlorothiazide (PRINZIDE,ZESTORETIC) 20-12.5 MG per tablet Take 1 tablet by mouth daily.     Yes [provider]  Multiple Vitamins-Minerals (CENTRUM ULTRA WOMENS) TABS Take 1 tablet by mouth daily.     Yes [provider]  naproxen sodium (ANAPROX) 220 MG tablet Take 440 mg by mouth 2 (two) times daily with a meal.   Yes [provider]  rosuvastatin (CRESTOR) 20 MG tablet Take 20 mg by mouth daily.   Yes [provider]  senna (SENOKOT) 8.6 MG  tablet Take 2 tablets by mouth at bedtime.    Yes [provider]   Physical Exam: Vitals:   04/26/18 0806 04/26/18 0810  BP:  (!) 122/96  Pulse:  92  Resp:  18  Temp:  (!) 97.5 F (36.4 C)  TempSrc:  Oral  SpO2:  96%  Weight: 65.3 kg   Height: 5\' 4"  (1.626 m)      General exam: chronically ill appearing and lethargic but arousable with moderate stimulation.  Moderately built and nourished patient, lying comfortably supine on the gurney in no obvious distress.  Head, eyes and ENT: Nontraumatic and normocephalic. Pupils equally reacting to light and accommodation. Oral mucosa very dry.  Neck: Supple. No JVD, carotid bruit or thyromegaly.  Lymphatics: No lymphadenopathy.  Respiratory system: Clear to auscultation. No increased work of breathing.  Cardiovascular system: normal S1 and S2 heard.   Gastrointestinal system: Abdomen is nondistended, soft and mild epigastric tenderness. No guarding. Normal bowel sounds heard. No organomegaly or masses appreciated.  Central nervous system: lethargic but arousable.  No gross focal neurological deficits. Pt not able to fully participate in exam.   Extremities: Symmetric 5 x 5 power. Peripheral pulses symmetrically felt.   Skin: No rashes or acute findings.  Musculoskeletal system: Negative exam.  Psychiatry: Unable to fully assess.   Labs on Admission:  Basic Metabolic Panel: Recent Labs  Lab 04/26/18 0910  NA 142  K 3.4*  CL 106  CO2 25  GLUCOSE 105*  BUN 51*  CREATININE 2.29*  CALCIUM 9.2   Liver Function Tests: Recent Labs  Lab 04/26/18 0910  AST 12*  ALT 10  ALKPHOS 58  BILITOT 1.0  PROT 6.8  ALBUMIN 2.7*   Recent Labs  Lab 04/26/18 0910  LIPASE 37   No results for input(s): AMMONIA in the last 168 hours. CBC: Recent Labs  Lab 04/26/18 0910  WBC 12.7*  HGB 8.8*  HCT 30.5*  MCV 81.3  PLT 470*   Cardiac Enzymes: No results for input(s): CKTOTAL, CKMB, CKMBINDEX, TROPONINI in the last  168 hours.  BNP (last 3 results) No results for input(s): PROBNP in the last 8760 hours. CBG: No results for input(s): GLUCAP in the last 168 hours.  Radiological Exams on Admission: Ct Head Wo Contrast  Result Date: 04/26/2018 CLINICAL DATA:  Altered level of consciousness EXAM: CT HEAD WITHOUT CONTRAST TECHNIQUE: Contiguous axial images were obtained from the base of the skull through the vertex without intravenous contrast. COMPARISON:  May 13, 2011 FINDINGS: Brain: Ventricles appear normal in size and configuration for age and are stable. There is extensive frontal and parietal lobe atrophy, somewhat more severe on the left than on the right. There appears to be a degree of chronic subdural hygroma on the left with chronic mild mass  effect on the left frontal lobe, stable. No change in the appearance of the extra-axial fluid is apparent on this study. There is no intracranial mass. There is no hemorrhage in the intra-axial or extra-axial regions. No midline shift. There is encephalomalacia in portions of the left temporal lobe with prior infarcts in the left posterior lentiform nucleus with involvement of portions of the left internal and external capsules posteriorly, stable. There is evidence of a prior infarct at the left posterior temporal-anterior occipital junction. No acute infarct is evident. Vascular: No hyperdense vessel. There is calcification in each carotid siphon region. Skull: The bony calvarium appears intact. Sinuses/Orbits: There is mucosal thickening in several ethmoid air cells. Orbits appear symmetric bilaterally. Other: Mastoids are essentially aplastic bilaterally. IMPRESSION: 1. Extensive frontal and parietal lobe atrophy, more severe on the left than on the right, stable from 2012. 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 No acute hemorrhage. No mass or midline shift. 2. Encephalomalacia left temporal lobe with left  basal ganglia infarct posteriorly, stable. Prior left temporal occipital junction infarct. No acute infarct evident. 3.  Foci of arterial vascular calcification noted. 4.  Mucosal thickening in several ethmoid air cells. 5.  Aplastic mastoids bilaterally. Electronically Signed   By: Lowella Grip III M.D.   On: 04/26/2018 09:15   Dg Chest Port 1 View  Result Date: 04/26/2018 CLINICAL DATA:  Altered mental status EXAM: PORTABLE CHEST 1 VIEW COMPARISON:  04/17/2018 FINDINGS: Cardiomegaly. Low lung volumes. No confluent opacities or effusions. Chondroid lesion again noted in the proximal left humerus, unchanged. No acute bony abnormality. IMPRESSION: Cardiomegaly.  Low lung volumes.  No active disease. Electronically Signed   By: Rolm Baptise M.D.   On: 04/26/2018 08:55    EKG: Independently reviewed. Sinus rhythm seen.   Assessment/Plan Principal Problem:   Acute renal failure (ARF) (HCC) Active Problems:   Altered mental state   History of CVA (cerebrovascular accident)   Dehydration   Hypertension   UTI (urinary tract infection)   Anemia, unspecified   Leukocytosis   Cerebrovascular disease   Hypokalemia   1. Dehydration - Pt is clinically dehydrated and hopefully will respond well to gentle hydration as she has done in the past.  Continue IV fluids.   2. AKI - Pt has a bump in creatinine likely related to dehydration and fluid losses from UTI.  Treating with gentle hydration and will follow BMP.  Holding home lisinopril.   3. Altered mental status - Improving with hydration.  I do not suspect a recurrent CVA at this time but will monitor and get neuro checks done.  CT brain no acute findings.  I suspect she will improve with IV fluid hydration and treating infection.  4. Anemia of chronic disease - Hg down from 11.2 to 8.8.  Will hemoccult all stools, check anemia panel. Recheck CBC in AM.  May need GI consult if she turns out to be heme positive.  Pt is taking plavix from her large  stroke and also she has severe carotid artery disease.  Hold plavix for now.  Started on oral protonix BID.  Holding naproxen (although husband says that she hasn't been taking this at home recently).  5. Severe carotid artery disease - Pt has severe disease but was not thought to be a candidate for any surgical correction.  Due to her large CVA she is on plavix for secondary stroke prevention.  Hold plavix for now.  6. Leukocytosis -  suspect secondary to UTI.  Check portable CXR in AM after hydration.  7. Hypokalemia - check magnesium, add potassium to IV fluids.  Recheck BMP in AM.   8. Essential hypertension - follow.  Holding lisinopril due to elevated creatinine.      DVT Prophylaxis: SCDs Code Status: Full   Family Communication: husband   Disposition Plan: Inpatient for IV fluids, IV antibiotics and work up for Altered mentation    Time spent: 25 minutes  Irwin Brakeman, MD [facility] Pager [phone]  If 7PM-7AM, please contact night-coverage [email or web address] Password [facility] 04/26/2018, 10:42 AM

## 2018-04-26 NOTE — ED Provider Notes (Signed)
[facility] Emergency Department Provider Note MRN:  [id number]  Arrival date & time: 04/26/18     Chief Complaint   Altered Mental Status   History of Present Illness   [name] is a 83 y.o. year-old female with a history of stroke presenting to the ED with chief complaint of altered mental status.  Husband reporting a 52-month gradual decline in function described as a failure to thrive.  Slowly decreasing the amount she cares for herself, decreasing the amount that she eats.  Becoming less and less interactive.  This morning was largely unarousable, very abnormal for her.  Patient was seen here recently and diagnosed with a urinary tract infection, completed her antibiotics at home.  I was unable to obtain an accurate HPI, PMH, or ROS due to the patient's altered mental status.  Review of Systems  Positive for altered mental status, failure to thrive.  Patient's Health History    Past Medical History:  Diagnosis Date  . 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 Constipation   . Degenerative joint disease of knee, right    right knee  . Hearing impaired person    wears hearing aids  . High cholesterol   . History of blood transfusion   . Hx: UTI (urinary tract infection)   . Hypertension   . Kidney stones   . Stroke [facility])    speech is effected slightly , right sided weakness  (has complete obstruction left carotid - per husband)    Past Surgical History:  Procedure Laterality Date  . APPENDECTOMY    . HARDWARE REMOVAL Right 10/05/2014   Procedure: HARDWARE REMOVAL;  Surgeon: Dorna Leitz, MD;  Location: [place];  Service: Orthopedics;  Laterality: Right;  . INTRAMEDULLARY (IM) NAIL INTERTROCHANTERIC Right 06/12/2014   Procedure: INTRAMEDULLARY (IM) NAIL INTERTROCHANTRIC RIGHT HIP;  Surgeon: Alta Corning, MD;  Location: [place];  Service: Orthopedics;  Laterality: Right;  . kidney stones    . LITHOTRIPSY    . PEG PLACEMENT     and removal  . PEG  TUBE REMOVAL    . RADIOLOGY WITH ANESTHESIA  04/22/2012   Procedure: RADIOLOGY WITH ANESTHESIA;  Surgeon: Rob Hickman, MD;  Location: [place];  Service: Radiology;  Laterality: N/A;  VERTEBRAL ARTERY STENT PLACEMENT AND CEREBRAL ANGIOGRAM  . TONSILLECTOMY    . TYMPANOPLASTY      Family History  Problem Relation Age of Onset  . CVA Father     Social History   Socioeconomic History  . Marital status: Married    Spouse name: Not on file  . Number of children: Not on file  . Years of education: Not on file  . Highest education level: Not on file  Occupational History  . Not on file  Social Needs  . Financial resource strain: Not on file  . Food insecurity:    Worry: Not on file    Inability: Not on file  . Transportation needs:    Medical: Not on file    Non-medical: Not on file  Tobacco Use  . Smoking status: Former Smoker    Last attempt to quit: 10/02/1983    Years since quitting: 34.5  . Smokeless tobacco: Never Used  Substance and Sexual Activity  . Alcohol use: Yes    Comment: occasional wine  . Drug use: No  . Sexual activity: Not on file  Lifestyle  . Physical activity:    Days per week: Not on file  Minutes per session: Not on file  . Stress: Not on file  Relationships  . Social connections:    Talks on phone: Not on file    Gets together: Not on file    Attends religious service: Not on file    Active member of club or organization: Not on file    Attends meetings of clubs or organizations: Not on file    Relationship status: Not on file  . Intimate partner violence:    Fear of current or ex partner: Not on file    Emotionally abused: Not on file    Physically abused: Not on file    Forced sexual activity: Not on file  Other Topics Concern  . Not on file  Social History Narrative  . Not on file     Physical Exam  Vital Signs and Nursing Notes reviewed Vitals:   04/26/18 1202 04/26/18 1411  BP: (!) 110/56 (!) 130/58  Pulse: 88 90  Resp:  16 19  Temp: 98 F (36.7 C)   SpO2: 96% 96%    CONSTITUTIONAL: Well-appearing, NAD NEURO: Somnolent but wakes to voice, oriented to name, moving all extremities EYES:  eyes equal and reactive ENT/NECK:  no LAD, no JVD CARDIO: Regular rate, well-perfused, normal S1 and S2 PULM:  CTAB no wheezing or rhonchi GI/GU:  normal bowel sounds, non-distended, non-tender MSK/SPINE:  No gross deformities, no edema SKIN:  no rash, atraumatic PSYCH:  Appropriate speech and behavior  Diagnostic and Interventional Summary    EKG Interpretation  Date/Time:  Friday April 26 2018 08:07:29 EST Ventricular Rate:  91 PR Interval:    QRS Duration: 85 QT Interval:  384 QTC Calculation: 473 R Axis:   5 Text Interpretation:  Sinus rhythm Atrial premature complexes Abnormal R-wave progression, early transition Confirmed by Gerlene Fee [phone]) on 04/26/2018 9:56:12 AM      Labs Reviewed  URINALYSIS, ROUTINE W REFLEX MICROSCOPIC - Abnormal; Notable for the following components:      Result Value   APPearance CLOUDY (*)    Leukocytes, UA LARGE (*)    WBC, UA >50 (*)    Bacteria, UA RARE (*)    All other components within normal limits  CBC - Abnormal; Notable for the following components:   WBC 12.7 (*)    RBC 3.75 (*)    Hemoglobin 8.8 (*)    HCT 30.5 (*)    MCH 23.5 (*)    MCHC 28.9 (*)    Platelets 470 (*)    All other components within normal limits  COMPREHENSIVE METABOLIC PANEL - Abnormal; Notable for the following components:   Potassium 3.4 (*)    Glucose, Bld 105 (*)    BUN 51 (*)    Creatinine, Ser 2.29 (*)    Albumin 2.7 (*)    AST 12 (*)    GFR calc non Af Amer 19 (*)    GFR calc Af Amer 22 (*)    All other components within normal limits  URINE CULTURE  LIPASE, BLOOD  TSH  I-STAT TROPONIN, ED  I-STAT CG4 LACTIC ACID, ED  POC OCCULT BLOOD, ED    CT HEAD WO CONTRAST  Final Result    DG Chest Port 1 View  Final Result    Portable chest 1 View    (Results  Pending)    Medications  rosuvastatin (CRESTOR) tablet 10 mg (has no administration in time range)  clopidogrel (PLAVIX) tablet 75 mg (75 mg Oral Given 04/26/18 1223)  multivitamin with minerals tablet (1 tablet Oral Given 04/26/18 1224)  latanoprost (XALATAN) 0.005 % ophthalmic solution 1 drop (has no administration in time range)  0.9 % NaCl with KCl 40 mEq / L  infusion (60 mL/hr Intravenous New Bag/Given 04/26/18 1226)  acetaminophen (TYLENOL) tablet 650 mg (has no administration in time range)    Or  acetaminophen (TYLENOL) suppository 650 mg (has no administration in time range)  ondansetron (ZOFRAN) tablet 4 mg (has no administration in time range)    Or  ondansetron (ZOFRAN) injection 4 mg (has no administration in time range)  traZODone (DESYREL) tablet 25 mg (has no administration in time range)  pantoprazole (PROTONIX) EC tablet 40 mg (40 mg Oral Given 04/26/18 1224)  senna (SENOKOT) tablet 17.2 mg (has no administration in time range)  sodium chloride 0.9 % bolus 500 mL (0 mLs Intravenous Stopped 04/26/18 1102)  sodium chloride 0.9 % bolus 1,000 mL (1,000 mLs Intravenous New Bag/Given 04/26/18 1046)  cefTRIAXone (ROCEPHIN) 1 g in sodium chloride 0.9 % 100 mL IVPB ( Intravenous Stopped 04/26/18 1121)     Procedures Critical Care  ED Course and Medical Decision Making  I have reviewed the triage vital signs and the nursing notes.  Pertinent labs & imaging results that were available during my care of the patient were reviewed by me and considered in my medical decision making (see below for details).  Unclear etiology of failure to thrive and worsening mental status in this 83 year old female, considering recurrence or incomplete clearance of recent UTI, metabolic disarray, hemorrhagic conversion of old stroke.  Work-up pending.  Work-up reveals likely continued urinary tract infection, new unexplained anemia, AKI.  Admitted to hospitalist service for further  care.  Barth Kirks. Sedonia Small, [place] [email or web address]  Final Clinical Impressions(s) / ED Diagnoses     ICD-10-CM   1. AKI (acute kidney injury) ([place]) N17.9   2. Altered awareness, transient R40.4 DG Chest [facility] 1 View    DG Chest Port 1 View  3. Altered mental status R41.82 Portable chest 1 View    Portable chest 1 View  4. Anemia, unspecified type D64.9   5. Lower urinary tract infectious disease N39.0     ED Discharge Orders    None         Maudie Flakes, MD 04/26/18 1512

## 2018-04-26 NOTE — ED Triage Notes (Signed)
PT LAST SEEN AT 2000 LAST NIGHT PT RESPONSES TO PAIN. PT JUST FINISHED ABX FOR UTI

## 2018-04-27 ENCOUNTER — Inpatient Hospital Stay (HOSPITAL_COMMUNITY): Payer: PPO

## 2018-04-27 DIAGNOSIS — K922 Gastrointestinal hemorrhage, unspecified: Secondary | ICD-10-CM

## 2018-04-27 LAB — HEMOGLOBIN AND HEMATOCRIT, BLOOD
HCT: 28.9 % — ABNORMAL LOW (ref 36.0–46.0)
Hemoglobin: 8.6 g/dL — ABNORMAL LOW (ref 12.0–15.0)

## 2018-04-27 LAB — URINE CULTURE: CULTURE: NO GROWTH

## 2018-04-27 LAB — MAGNESIUM: MAGNESIUM: 1.8 mg/dL (ref 1.7–2.4)

## 2018-04-27 LAB — CBC WITH DIFFERENTIAL/PLATELET
Abs Immature Granulocytes: 0.08 10*3/uL — ABNORMAL HIGH (ref 0.00–0.07)
BASOS PCT: 1 %
Basophils Absolute: 0.1 10*3/uL (ref 0.0–0.1)
EOS ABS: 0.1 10*3/uL (ref 0.0–0.5)
Eosinophils Relative: 1 %
HEMATOCRIT: 21.3 % — AB (ref 36.0–46.0)
Hemoglobin: 6.1 g/dL — CL (ref 12.0–15.0)
Immature Granulocytes: 1 %
LYMPHS ABS: 1.8 10*3/uL (ref 0.7–4.0)
Lymphocytes Relative: 14 %
MCH: 23.5 pg — AB (ref 26.0–34.0)
MCHC: 28.6 g/dL — ABNORMAL LOW (ref 30.0–36.0)
MCV: 81.9 fL (ref 80.0–100.0)
MONO ABS: 0.7 10*3/uL (ref 0.1–1.0)
MONOS PCT: 5 %
Neutro Abs: 10.5 10*3/uL — ABNORMAL HIGH (ref 1.7–7.7)
Neutrophils Relative %: 78 %
PLATELETS: 412 10*3/uL — AB (ref 150–400)
RBC: 2.6 MIL/uL — ABNORMAL LOW (ref 3.87–5.11)
RDW: 14.7 % (ref 11.5–15.5)
WBC: 13.3 10*3/uL — ABNORMAL HIGH (ref 4.0–10.5)
nRBC: 0 % (ref 0.0–0.2)

## 2018-04-27 LAB — ABO/RH: ABO/RH(D): O POS

## 2018-04-27 LAB — COMPREHENSIVE METABOLIC PANEL
ALK PHOS: 45 U/L (ref 38–126)
ALT: 10 U/L (ref 0–44)
AST: 15 U/L (ref 15–41)
Albumin: 2.1 g/dL — ABNORMAL LOW (ref 3.5–5.0)
Anion gap: 6 (ref 5–15)
BILIRUBIN TOTAL: 0.7 mg/dL (ref 0.3–1.2)
BUN: 54 mg/dL — AB (ref 8–23)
CALCIUM: 7.6 mg/dL — AB (ref 8.9–10.3)
CO2: 19 mmol/L — ABNORMAL LOW (ref 22–32)
CREATININE: 2.01 mg/dL — AB (ref 0.44–1.00)
Chloride: 120 mmol/L — ABNORMAL HIGH (ref 98–111)
GFR calc Af Amer: 26 mL/min — ABNORMAL LOW (ref >60)
GFR, EST NON AFRICAN AMERICAN: 22 mL/min — AB (ref >60)
Glucose, Bld: 97 mg/dL (ref 70–99)
POTASSIUM: 4.4 mmol/L (ref 3.5–5.1)
Sodium: 145 mmol/L (ref 135–145)
TOTAL PROTEIN: 5.2 g/dL — AB (ref 6.5–8.1)

## 2018-04-27 LAB — PROTIME-INR
INR: 1.31
Prothrombin Time: 16.1 seconds — ABNORMAL HIGH (ref 11.4–15.2)

## 2018-04-27 LAB — OCCULT BLOOD X 1 CARD TO LAB, STOOL: Fecal Occult Bld: POSITIVE — AB

## 2018-04-27 LAB — PREPARE RBC (CROSSMATCH)

## 2018-04-27 MED ORDER — SODIUM CHLORIDE 0.9 % IV SOLN
1.0000 g | INTRAVENOUS | Status: DC
  Administered 2018-04-27 – 2018-04-28 (×2): 1 g via INTRAVENOUS
  Filled 2018-04-27: qty 1
  Filled 2018-04-27: qty 10
  Filled 2018-04-27: qty 1

## 2018-04-27 MED ORDER — PANTOPRAZOLE SODIUM 40 MG IV SOLR
40.0000 mg | Freq: Two times a day (BID) | INTRAVENOUS | Status: DC
  Administered 2018-04-27 (×2): 40 mg via INTRAVENOUS
  Filled 2018-04-27 (×2): qty 40

## 2018-04-27 MED ORDER — SODIUM CHLORIDE 0.9 % IV SOLN
1.0000 g | INTRAVENOUS | Status: DC
  Filled 2018-04-27 (×3): qty 10

## 2018-04-27 MED ORDER — SODIUM CHLORIDE 0.9 % IV SOLN
INTRAVENOUS | Status: DC
  Administered 2018-04-27 – 2018-04-30 (×2): via INTRAVENOUS

## 2018-04-27 MED ORDER — SODIUM CHLORIDE 0.9 % IV BOLUS: 1000.0000 mL | Freq: Once | INTRAVENOUS | Status: DC

## 2018-04-27 MED ORDER — SODIUM CHLORIDE 0.9% IV SOLUTION
Freq: Once | INTRAVENOUS | Status: AC
  Administered 2018-04-27: 11:00:00 via INTRAVENOUS

## 2018-04-27 NOTE — Consult Note (Addendum)
Referring Provider: No ref. provider found Primary Care Physician:  Sinda Du, MD Primary Gastroenterologist:  Dr. Laural Golden  Reason for Consultation:    GI bleed and anemia.  HPI:   Patient is 83 year old Caucasian female who was in usual state of health until about 3 months ago when she started to lose weight.  According to her husband [name] her oral intake diminished.  She would refuse to go see her primary care physician Dr. Luan Pulling.  He decided to pay her home visit.  She was felt to be depressed and begun on antidepressant.  She stopped medication after taking first dose because it made her nauseated.  Earlier this year she was experiencing solid food dysphagia and she had few episode of food impaction relieved with regurgitation.  She underwent barium pill esophagogram in April this year.  She was noted to have Schatzki's ring.  Since then she has been eating slowly and chew her food good and she has not had any issues with swallowing or food impaction. She was brought to emergency room on 04/17/2018 because of failure to thrive.  Her hemoglobin was 11.2 g but she was felt to be dehydrated.  She was treated and discharged. Yesterday morning patient was very lethargic and her husband was not able to get any response out of her.  Therefore 911 was called and she was brought to emergency room.  She was evaluated and admitted to Dr. Luan Pulling service. Since admission she has been passing maroonish blood per rectum.  She has not experienced hematemesis.  No history of abdominal pain.  Her husband feels she has lost at least 30 pounds.  Patient has been taking 2 Aleve tablets every day for arthralgias.  She had CVA about 9-1/2 years ago.  She had a near total recovery.  She was ambulating freely until she had hip fracture and surgery May 2016.  She has been ambulating using walker since then. She has declined colorectal screening in the past.  Patient is a retired Immunologist.  She is married.  This is  her second marriage.  She has 2 children from her first marriage.  She does not smoke cigarettes or drink alcohol.     Past Medical History:  Diagnosis Date  . 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 Constipation   . Degenerative joint disease of knee, right    right knee  . Hearing impaired person    wears hearing aids  . High cholesterol   . History of blood transfusion   . Hx: UTI (urinary tract infection)   . Hypertension   . Kidney stones   . Stroke [facility])    speech is effected slightly , right sided weakness  (has complete obstruction left carotid - per husband)    Past Surgical History:  Procedure Laterality Date  . APPENDECTOMY    . HARDWARE REMOVAL Right 10/05/2014   Procedure: HARDWARE REMOVAL;  Surgeon: Dorna Leitz, MD;  Location: [place];  Service: Orthopedics;  Laterality: Right;  . INTRAMEDULLARY (IM) NAIL INTERTROCHANTERIC Right 06/12/2014   Procedure: INTRAMEDULLARY (IM) NAIL INTERTROCHANTRIC RIGHT HIP;  Surgeon: Alta Corning, MD;  Location: [place];  Service: Orthopedics;  Laterality: Right;  . kidney stones    . LITHOTRIPSY    . PEG PLACEMENT     and removal  . PEG TUBE REMOVAL    . RADIOLOGY WITH ANESTHESIA  04/22/2012   Procedure: RADIOLOGY WITH ANESTHESIA;  Surgeon: Rob Hickman, MD;  Location: [place];  Service:  Radiology;  Laterality: N/A;  VERTEBRAL ARTERY STENT PLACEMENT AND CEREBRAL ANGIOGRAM  . TONSILLECTOMY    . TYMPANOPLASTY      Prior to Admission medications   Medication Sig Start Date End Date Taking? Authorizing Provider  clopidogrel (PLAVIX) 75 MG tablet Take 1 tablet (75 mg total) by mouth daily. 07/13/14  Yes Reyne Dumas, MD  latanoprost (XALATAN) 0.005 % ophthalmic solution Place 1 drop into both eyes at bedtime. 04/11/18  Yes [provider]  lisinopril-hydrochlorothiazide (PRINZIDE,ZESTORETIC) 20-12.5 MG per tablet Take 1 tablet by mouth daily.     Yes [provider]  Multiple Vitamins-Minerals (CENTRUM ULTRA  WOMENS) TABS Take 1 tablet by mouth daily.     Yes [provider]  rosuvastatin (CRESTOR) 20 MG tablet Take 20 mg by mouth daily.   Yes [provider]  senna (SENOKOT) 8.6 MG tablet Take 2 tablets by mouth at bedtime.    Yes [provider]    Current Facility-Administered Medications  Medication Dose Route Frequency Provider Last Rate Last Dose  . 0.9 %  sodium chloride infusion   Intravenous Continuous Sinda Du, MD      . acetaminophen (TYLENOL) tablet 650 mg  650 mg Oral Q6H PRN Johnson, Clanford L, MD       Or  . acetaminophen (TYLENOL) suppository 650 mg  650 mg Rectal Q6H PRN Johnson, Clanford L, MD      . latanoprost (XALATAN) 0.005 % ophthalmic solution 1 drop  1 drop Both Eyes QHS Johnson, Clanford L, MD   1 drop at 04/26/18 2116  . multivitamin with minerals tablet   Oral Daily Murlean Iba, MD   1 tablet at 04/27/18 [phone]  . ondansetron (ZOFRAN) tablet 4 mg  4 mg Oral Q6H PRN Johnson, Clanford L, MD       Or  . ondansetron (ZOFRAN) injection 4 mg  4 mg Intravenous Q6H PRN Johnson, Clanford L, MD   4 mg at 04/27/18 1034  . pantoprazole (PROTONIX) injection 40 mg  40 mg Intravenous Emmit Pomfret, MD   40 mg at 04/27/18 1117  . rosuvastatin (CRESTOR) tablet 10 mg  10 mg Oral q1800 Johnson, Clanford L, MD   10 mg at 04/26/18 1713  . senna (SENOKOT) tablet 17.2 mg  2 tablet Oral QHS Johnson, Clanford L, MD   17.2 mg at 04/26/18 2115  . sodium chloride 0.9 % bolus 1,000 mL  1,000 mL Intravenous Once Derrill Kay A, MD      . traZODone (DESYREL) tablet 25 mg  25 mg Oral QHS PRN Murlean Iba, MD        Allergies as of 04/26/2018  . (No Known Allergies)    Family History  Problem Relation Age of Onset  . CVA Father     Social History   Socioeconomic History  . Marital status: Married    Spouse name: Not on file  . Number of children: Not on file  . Years of education: Not on file  . Highest education level: Not on file   Occupational History  . Not on file  Social Needs  . Financial resource strain: Not on file  . Food insecurity:    Worry: Not on file    Inability: Not on file  . Transportation needs:    Medical: Not on file    Non-medical: Not on file  Tobacco Use  . Smoking status: Former Smoker    Last attempt to quit: 10/02/1983  Years since quitting: 34.5  . Smokeless tobacco: Never Used  Substance and Sexual Activity  . Alcohol use: Yes    Comment: occasional wine  . Drug use: No  . Sexual activity: Not on file  Lifestyle  . Physical activity:    Days per week: Not on file    Minutes per session: Not on file  . Stress: Not on file  Relationships  . Social connections:    Talks on phone: Not on file    Gets together: Not on file    Attends religious service: Not on file    Active member of club or organization: Not on file    Attends meetings of clubs or organizations: Not on file    Relationship status: Not on file  . Intimate partner violence:    Fear of current or ex partner: Not on file    Emotionally abused: Not on file    Physically abused: Not on file    Forced sexual activity: Not on file  Other Topics Concern  . Not on file  Social History Narrative  . Not on file    Review of Systems: See HPI, otherwise normal ROS  Physical Exam: Temp:  [97.9 F (36.6 C)-98.5 F (36.9 C)] 98.1 F (36.7 C) (11/23 1149) Pulse Rate:  [78-103] 91 (11/23 1150) Resp:  [16-19] 16 (11/23 1149) BP: (70-130)/(33-58) 92/36 (11/23 1150) SpO2:  [96 %-100 %] 96 % (11/23 1150)    Patient is awake and alert and responds appropriately to questions. She appears pale.  Conjunctiva is also pale. Sclerae nonicteric. Oropharyngeal mucosa is unremarkable. No adenopathy or thyromegaly noted. Cardiac exam with regular with normal S1-S2.  No murmur gallop noted. Auscultation of lungs reveal vesicular breath sounds bilaterally. Abdomen is symmetrical.  She has small vertical scar in right lower  quadrant of her abdomen.  Bowel sounds are normal.  On palpation abdomen is soft and nontender with organomegaly or masses. No peripheral edema or clubbing noted.   Lab Results: Recent Labs    04/26/18 0910 04/27/18 0856  WBC 12.7* 13.3*  HGB 8.8* 6.1*  HCT 30.5* 21.3*  PLT 470* 412*   BMET Recent Labs    04/26/18 0910 04/27/18 0656  NA 142 145  K 3.4* 4.4  CL 106 120*  CO2 25 19*  GLUCOSE 105* 97  BUN 51* 54*  CREATININE 2.29* 2.01*  CALCIUM 9.2 7.6*   LFT Recent Labs    04/27/18 0656  PROT 5.2*  ALBUMIN 2.1*  AST 15  ALT 10  ALKPHOS 45  BILITOT 0.7    Studies/Results: Ct Head Wo Contrast  Result Date: 04/26/2018 CLINICAL DATA:  Altered level of consciousness EXAM: CT HEAD WITHOUT CONTRAST TECHNIQUE: Contiguous axial images were obtained from the base of the skull through the vertex without intravenous contrast. COMPARISON:  May 13, 2011 FINDINGS: Brain: Ventricles appear normal in size and configuration for age and are stable. There is extensive frontal and parietal lobe atrophy, somewhat more severe on the left than on the right. 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 No change in the appearance of the extra-axial fluid is apparent on this study. There is no intracranial mass. There is no hemorrhage in the intra-axial or extra-axial regions. No midline shift. There is encephalomalacia in portions of the left temporal lobe with prior infarcts in the left posterior lentiform nucleus with involvement of portions of  the left internal and external capsules posteriorly, stable. There is evidence of a prior infarct at the left posterior temporal-anterior occipital junction. No acute infarct is evident. Vascular: No hyperdense vessel. There is calcification in each carotid siphon region. Skull: The bony calvarium appears intact. Sinuses/Orbits: There is mucosal thickening in  several ethmoid air cells. Orbits appear symmetric bilaterally. Other: Mastoids are essentially aplastic bilaterally. IMPRESSION: 1. Extensive frontal and parietal lobe atrophy, more severe on the left than on the right, stable from 2012. 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 No acute hemorrhage. No mass or midline shift. 2. Encephalomalacia left temporal lobe with left basal ganglia infarct posteriorly, stable. Prior left temporal occipital junction infarct. No acute infarct evident. 3.  Foci of arterial vascular calcification noted. 4.  Mucosal thickening in several ethmoid air cells. 5.  Aplastic mastoids bilaterally. Electronically Signed   By: Lowella Grip III M.D.   On: 04/26/2018 09:15   Portable Chest 1 View  Result Date: 04/27/2018 CLINICAL DATA:  Follow-up exam.  Altered mental status EXAM: PORTABLE CHEST 1 VIEW COMPARISON:  Chest radiograph 04/26/2018 FINDINGS: Stable cardiomegaly. Aortic atherosclerosis. Low lung volumes. Bibasilar atelectasis. No pleural effusion or pneumothorax. Chondroid lesion proximal left humerus. IMPRESSION: No acute cardiopulmonary process. Low lung volumes with basilar atelectasis. Electronically Signed   By: Lovey Newcomer M.D.   On: 04/27/2018 08:59   Dg Chest Port 1 View  Result Date: 04/26/2018 CLINICAL DATA:  Altered mental status EXAM: PORTABLE CHEST 1 VIEW COMPARISON:  04/17/2018 FINDINGS: Cardiomegaly. Low lung volumes. No confluent opacities or effusions. Chondroid lesion again noted in the proximal left humerus, unchanged. No acute bony abnormality. IMPRESSION: Cardiomegaly.  Low lung volumes.  No active disease. Electronically Signed   By: Rolm Baptise M.D.   On: 04/26/2018 08:55    Assessment;  Patient is 83 year old Caucasian female who has not been feeling well for the last 3 months.  She has had anorexia with a 30 pound weight loss according to her husband who was found to be unresponsive  yesterday morning and brought to emergency room.  She was felt to be dehydrated and noted to be anemic.  Her hemoglobin was 8.8 g.  Her hemoglobin was 11.2 g when she was seen in emergency room on 04/17/2018 for UTI.  This morning hemoglobin was 6.1 g.  He was passing maroon blood per rectum.  She has had emesis about coffee-ground.  Patient is receiving 1 unit of PRBCs.  Patient is on clopidogrel and she also takes 2 Aleve every day which is not listed in her medications. Need to rule out upper GI bleed before recommending colonoscopy. Patient has never been screened for CRC. She also has a history of solid food dysphagia.  She was documented to have noncritical Schatzki's ring on EGD about 7 months ago.  Breathing is critical it would be dilated. Last clopidogrel dose on 04/25/2018. Patient is now on pantoprazole 40 mg IV every 12 hours.   Recommendations;  Posttransfusion H&H. INR with next blood draw. Diagnostic esophagogastroduodenoscopy on 04/28/2018. Unless bleeding lesion is identified on EGD she will need further testing.   LOS: 1 day   Jessicamarie Amiri  04/27/2018, 12:33 PM

## 2018-04-27 NOTE — Progress Notes (Signed)
Patient blood pressure increased with bolus.  Neurochecks have been same through night.

## 2018-04-27 NOTE — Progress Notes (Signed)
Went in to recheck patient blood pressure.  Patient blood pressure low. Notified MD and received order for bolus.  Bolus currently running.  Will continue to monitor patient.

## 2018-04-27 NOTE — Progress Notes (Addendum)
Subjective: She was admitted yesterday with failure to thrive, acute kidney injury and now appears to be having significant GI bleed.  Her hemoglobin level was 11 about 10 days ago and is down to 6 this morning.  She has received IV fluids and she is been in the hospital.  Stool this morning was maroon-colored.  She has no complaints of abdominal pain.  Objective: Vital signs in last 24 hours: Temp:  [98 F (36.7 C)-98.5 F (36.9 C)] 98.3 F (36.8 C) (11/23 0615) Pulse Rate:  [85-103] 85 (11/23 0615) Resp:  [13-19] 18 (11/23 0615) BP: (70-130)/(40-60) 110/49 (11/23 0615) SpO2:  [96 %-100 %] 100 % (11/23 0615) Weight change:     Intake/Output from previous day: 11/22 0701 - 11/23 0700 In: 1891.1 [P.O.:240; I.V.:93.8; IV Piggyback:1557.3] Out: -   PHYSICAL EXAM General appearance: alert, cooperative and She has had a stroke and has significant speech impediment from that Resp: clear to auscultation bilaterally Cardio: regular rate and rhythm, S1, S2 normal, no murmur, click, rub or gallop GI: soft, non-tender; bowel sounds normal; no masses,  no organomegaly Extremities: extremities normal, atraumatic, no cyanosis or edema  Lab Results:  Results for orders placed or performed during the hospital encounter of 04/26/18 (from the past 48 hour(s))  Urinalysis, Routine w reflex microscopic     Status: Abnormal   Collection Time: 04/26/18  8:10 AM  Result Value Ref Range   Color, Urine YELLOW YELLOW   APPearance CLOUDY (A) CLEAR   Specific Gravity, Urine 1.017 1.005 - 1.030   pH 5.0 5.0 - 8.0   Glucose, UA NEGATIVE NEGATIVE mg/dL   Hgb urine dipstick NEGATIVE NEGATIVE   Bilirubin Urine NEGATIVE NEGATIVE   Ketones, ur NEGATIVE NEGATIVE mg/dL   Protein, ur NEGATIVE NEGATIVE mg/dL   Nitrite NEGATIVE NEGATIVE   Leukocytes, UA LARGE (A) NEGATIVE   RBC / HPF 0-5 0 - 5 RBC/hpf   WBC, UA >50 (H) 0 - 5 WBC/hpf   Bacteria, UA RARE (A) NONE SEEN   Squamous Epithelial / LPF 0-5 0 - 5    WBC Clumps PRESENT    Mucus PRESENT    Hyaline Casts, UA PRESENT     Comment: Performed at [facility], [address] [place], [place] [address]  CBC     Status: Abnormal   Collection Time: 04/26/18  9:10 AM  Result Value Ref Range   WBC 12.7 (H) 4.0 - 10.5 K/uL   RBC 3.75 (L) 3.87 - 5.11 MIL/uL   Hemoglobin 8.8 (L) 12.0 - 15.0 g/dL   HCT 30.5 (L) 36.0 - 46.0 %   MCV 81.3 80.0 - 100.0 fL   MCH 23.5 (L) 26.0 - 34.0 pg   MCHC 28.9 (L) 30.0 - 36.0 g/dL   RDW 14.5 11.5 - 15.5 %   Platelets 470 (H) 150 - 400 K/uL   nRBC 0.0 0.0 - 0.2 %    Comment: Performed at [facility], [address], [place], [place] [address]  Comprehensive metabolic panel     Status: Abnormal   Collection Time: 04/26/18  9:10 AM  Result Value Ref Range   Sodium 142 135 - 145 mmol/L   Potassium 3.4 (L) 3.5 - 5.1 mmol/L   Chloride 106 98 - 111 mmol/L   CO2 25 22 - 32 mmol/L   Glucose, Bld 105 (H) 70 - 99 mg/dL   BUN 51 (H) 8 - 23 mg/dL   Creatinine, Ser 2.29 (H) 0.44 - 1.00 mg/dL   Calcium  9.2 8.9 - 10.3 mg/dL   Total Protein 6.8 6.5 - 8.1 g/dL   Albumin 2.7 (L) 3.5 - 5.0 g/dL   AST 12 (L) 15 - 41 U/L   ALT 10 0 - 44 U/L   Alkaline Phosphatase 58 38 - 126 U/L   Total Bilirubin 1.0 0.3 - 1.2 mg/dL   GFR calc non Af Amer 19 (L) >60 mL/min   GFR calc Af Amer 22 (L) >60 mL/min    Comment: (NOTE) The eGFR has been calculated using the CKD EPI equation. This calculation has not been validated in all clinical situations. eGFR's persistently <60 mL/min signify possible Chronic Kidney Disease.    Anion gap 11 5 - 15    Comment: Performed at [facility], [address], [place], [place] [address]  Lipase, blood     Status: None   Collection Time: 04/26/18  9:10 AM  Result Value Ref Range   Lipase 37 11 - 51 U/L    Comment: Performed at [facility], [address], [place], [place] [address]  TSH     Status: None   Collection Time: 04/26/18  9:21 AM  Result Value Ref Range   TSH 0.732 0.350 - 4.500 uIU/mL     Comment: Performed by a 3rd Generation assay with a functional sensitivity of <=0.01 uIU/mL. Performed at [facility], [address], [place], [place] [address]   I-Stat CG4 Lactic Acid, ED     Status: None   Collection Time: 04/26/18  9:31 AM  Result Value Ref Range   Lactic Acid, Venous 1.10 0.5 - 1.9 mmol/L  I-stat troponin, ED     Status: None   Collection Time: 04/26/18  9:33 AM  Result Value Ref Range   Troponin i, poc 0.01 0.00 - 0.08 ng/mL   Comment 3            Comment: Due to the release kinetics of cTnI, a negative result within the first hours of the onset of symptoms does not rule out myocardial infarction with certainty. If myocardial infarction is still suspected, repeat the test at appropriate intervals.   Comprehensive metabolic panel     Status: Abnormal   Collection Time: 04/27/18  6:56 AM  Result Value Ref Range   Sodium 145 135 - 145 mmol/L   Potassium 4.4 3.5 - 5.1 mmol/L    Comment: DELTA CHECK NOTED   Chloride 120 (H) 98 - 111 mmol/L   CO2 19 (L) 22 - 32 mmol/L   Glucose, Bld 97 70 - 99 mg/dL   BUN 54 (H) 8 - 23 mg/dL   Creatinine, Ser 2.01 (H) 0.44 - 1.00 mg/dL   Calcium 7.6 (L) 8.9 - 10.3 mg/dL   Total Protein 5.2 (L) 6.5 - 8.1 g/dL   Albumin 2.1 (L) 3.5 - 5.0 g/dL   AST 15 15 - 41 U/L   ALT 10 0 - 44 U/L   Alkaline Phosphatase 45 38 - 126 U/L   Total Bilirubin 0.7 0.3 - 1.2 mg/dL   GFR calc non Af Amer 22 (L) >60 mL/min   GFR calc Af Amer 26 (L) >60 mL/min    Comment: (NOTE) The eGFR has been calculated using the CKD EPI equation. This calculation has not been validated in all clinical situations. eGFR's persistently <60 mL/min signify possible Chronic Kidney Disease.    Anion gap 6 5 - 15    Comment: Performed at [facility], [address] [place], [place] [address]  Magnesium  Status: None   Collection Time: 04/27/18  6:56 AM  Result Value Ref Range   Magnesium 1.8 1.7 - 2.4 mg/dL    Comment: Performed at [facility],  [address] [place], [place] [address]  Occult blood card to lab, stool     Status: Abnormal   Collection Time: 04/27/18  8:35 AM  Result Value Ref Range   Fecal Occult Bld POSITIVE (A) NEGATIVE    Comment: Performed at [facility], [address], [place], [place] [address]  CBC with Differential/Platelet     Status: Abnormal   Collection Time: 04/27/18  8:56 AM  Result Value Ref Range   WBC 13.3 (H) 4.0 - 10.5 K/uL   RBC 2.60 (L) 3.87 - 5.11 MIL/uL   Hemoglobin 6.1 (LL) 12.0 - 15.0 g/dL    Comment: This critical result has verified and been called to [facility] by Lorette Ang on 11 23 2019 at 0922, and has been read back. REPEATED TO VERIFY DELTA CHECK NOTED This critical result has verified and been called to DISHMON,M by Lorette Ang on 11 23 2019 at 0926, and has been read back. DELTA CHECK NOTED REPEATED TO VERIFY    HCT 21.3 (L) 36.0 - 46.0 %   MCV 81.9 80.0 - 100.0 fL   MCH 23.5 (L) 26.0 - 34.0 pg   MCHC 28.6 (L) 30.0 - 36.0 g/dL   RDW 14.7 11.5 - 15.5 %   Platelets 412 (H) 150 - 400 K/uL   nRBC 0.0 0.0 - 0.2 %   Neutrophils Relative % 78 %   Neutro Abs 10.5 (H) 1.7 - 7.7 K/uL   Lymphocytes Relative 14 %   Lymphs Abs 1.8 0.7 - 4.0 K/uL   Monocytes Relative 5 %   Monocytes Absolute 0.7 0.1 - 1.0 K/uL   Eosinophils Relative 1 %   Eosinophils Absolute 0.1 0.0 - 0.5 K/uL   Basophils Relative 1 %   Basophils Absolute 0.1 0.0 - 0.1 K/uL   Immature Granulocytes 1 %   Abs Immature Granulocytes 0.08 (H) 0.00 - 0.07 K/uL    Comment: Performed at [facility], [address] [place], [place] [address]  Type and screen [facility]     Status: None   Collection Time: 04/27/18  9:50 AM  Result Value Ref Range   ABO/RH(D) O POS    Antibody Screen NEG    Sample Expiration      04/30/2018 Performed at [facility], [address] [place] [address] [place], [place] [address]   Prepare RBC     Status: None   Collection Time: 04/27/18  9:50 AM  Result Value Ref Range   Order Confirmation       ORDER PROCESSED BY BLOOD BANK Performed at [facility], [address], [place],  [address]     ABGS No results for input(s): PHART, PO2ART, TCO2, HCO3 in the last 72 hours.  Invalid input(s): PCO2 CULTURES No results found for this or any previous visit (from the past 240 hour(s)). Studies/Results: Ct Head Wo Contrast  Result Date: 04/26/2018 CLINICAL DATA:  Altered level of consciousness EXAM: CT HEAD WITHOUT CONTRAST TECHNIQUE: Contiguous axial images were obtained from the base of the skull through the vertex without intravenous contrast. COMPARISON:  May 13, 2011 FINDINGS: Brain: Ventricles appear normal in size and configuration for age and are stable. There is extensive frontal and parietal lobe atrophy, somewhat more severe on the left than on the right. There appears to be a degree of  chronic subdural hygroma on the left with chronic mild mass effect on the left frontal lobe, stable. No change in the appearance of the extra-axial fluid is apparent on this study. There is no intracranial mass. There is no hemorrhage in the intra-axial or extra-axial regions. No midline shift. There is encephalomalacia in portions of the left temporal lobe with prior infarcts in the left posterior lentiform nucleus with involvement of portions of the left internal and external capsules posteriorly, stable. There is evidence of a prior infarct at the left posterior temporal-anterior occipital junction. No acute infarct is evident. Vascular: No hyperdense vessel. There is calcification in each carotid siphon region. Skull: The bony calvarium appears intact. Sinuses/Orbits: There is mucosal thickening in several ethmoid air cells. Orbits appear symmetric bilaterally. Other: Mastoids are essentially aplastic bilaterally. IMPRESSION: 1. Extensive frontal and parietal lobe atrophy, more severe on the left than on the right, stable from 2012. 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 No acute hemorrhage. No mass or midline shift. 2. Encephalomalacia left temporal lobe with left basal ganglia infarct posteriorly, stable. Prior left temporal occipital junction infarct. No acute infarct evident. 3.  Foci of arterial vascular calcification noted. 4.  Mucosal thickening in several ethmoid air cells. 5.  Aplastic mastoids bilaterally. Electronically Signed   By: Lowella Grip III M.D.   On: 04/26/2018 09:15   Portable Chest 1 View  Result Date: 04/27/2018 CLINICAL DATA:  Follow-up exam.  Altered mental status EXAM: PORTABLE CHEST 1 VIEW COMPARISON:  Chest radiograph 04/26/2018 FINDINGS: Stable cardiomegaly. Aortic atherosclerosis. Low lung volumes. Bibasilar atelectasis. No pleural effusion or pneumothorax. Chondroid lesion proximal left humerus. IMPRESSION: No acute cardiopulmonary process. Low lung volumes with basilar atelectasis. Electronically Signed   By: Lovey Newcomer M.D.   On: 04/27/2018 08:59   Dg Chest Port 1 View  Result Date: 04/26/2018 CLINICAL DATA:  Altered mental status EXAM: PORTABLE CHEST 1 VIEW COMPARISON:  04/17/2018 FINDINGS: Cardiomegaly. Low lung volumes. No confluent opacities or effusions. Chondroid lesion again noted in the proximal left humerus, unchanged. No acute bony abnormality. IMPRESSION: Cardiomegaly.  Low lung volumes.  No active disease. Electronically Signed   By: Rolm Baptise M.D.   On: 04/26/2018 08:55    Medications:  Prior to Admission:  Medications Prior to Admission  Medication Sig Dispense Refill Last Dose  . clopidogrel (PLAVIX) 75 MG tablet Take 1 tablet (75 mg total) by mouth daily. 30 tablet 2 04/25/2018 at 0700  . latanoprost (XALATAN) 0.005 % ophthalmic solution Place 1 drop into both eyes at bedtime.   04/25/2018 at 2300  . lisinopril-hydrochlorothiazide (PRINZIDE,ZESTORETIC) 20-12.5 MG per tablet Take 1 tablet by mouth daily.     04/25/2018 at 0700  . Multiple Vitamins-Minerals (CENTRUM ULTRA  WOMENS) TABS Take 1 tablet by mouth daily.     04/25/2018 at 0700  . rosuvastatin (CRESTOR) 20 MG tablet Take 20 mg by mouth daily.   04/25/2018 at 2300  . senna (SENOKOT) 8.6 MG tablet Take 2 tablets by mouth at bedtime.    04/25/2018 at 2300   Scheduled: . sodium chloride   Intravenous Once  . latanoprost  1 drop Both Eyes QHS  . multivitamin with minerals   Oral Daily  . pantoprazole  40 mg Oral BID  . rosuvastatin  10 mg Oral q1800  . senna  2 tablet Oral QHS   Continuous: . 0.9 % NaCl with KCl 40 mEq / L 60 mL/hr (04/27/18 0530)  .  sodium chloride     EQF:[id number] **OR** acetaminophen, ondansetron **OR** ondansetron (ZOFRAN) IV, traZODone  Assesment: She was admitted with acute renal failure which I think is a combination of dehydration taking lisinopril and clearly now GI bleeding.  She is had poor oral intake for some time.  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  Hemoglobin level has dropped significantly from about 10 days ago.  No abdominal pain.  Her situation is complicated by the fact that she is had a severe stroke and is on Plavix because she is known to have continued cerebrovascular disease.  Plavix is being held now since she seems to have acute GI bleeding  She has acute blood loss anemia and is going to have blood transfusion  She needs GI consultation and I have discussed her situation by phone with Dr. Laural Golden  Urinalysis is suggesting UTI so Rocephin  She had altered mental status which is better with fluid resuscitation Principal Problem:   Acute renal failure (ARF) ([place]) Active Problems:   History of CVA (cerebrovascular accident)   Hypertension   UTI (urinary tract infection)   Dehydration   Anemia, unspecified   Altered mental state   Leukocytosis   Cerebrovascular disease   Hypokalemia    Plan: Discontinue Plavix.  Continue IV fluids.  Switch her pantoprazole  to IV.  N.p.o. for now.  GI consult.  Transfuse 2 units packed red blood cells    LOS: 1 day   Monterius Rolf L 04/27/2018, 10:39 AM

## 2018-04-28 ENCOUNTER — Encounter (HOSPITAL_COMMUNITY)
Admission: EM | Disposition: A | Discharge status: Home Health | Payer: Self-pay | Source: Home / Self Care | Attending: Pulmonary Disease

## 2018-04-28 ENCOUNTER — Encounter (HOSPITAL_COMMUNITY): Payer: Self-pay

## 2018-04-28 DIAGNOSIS — K264 Chronic or unspecified duodenal ulcer with hemorrhage: Secondary | ICD-10-CM

## 2018-04-28 DIAGNOSIS — K449 Diaphragmatic hernia without obstruction or gangrene: Secondary | ICD-10-CM

## 2018-04-28 DIAGNOSIS — K3189 Other diseases of stomach and duodenum: Secondary | ICD-10-CM

## 2018-04-28 DIAGNOSIS — K222 Esophageal obstruction: Secondary | ICD-10-CM

## 2018-04-28 HISTORY — PX: ESOPHAGOGASTRODUODENOSCOPY: SHX5428

## 2018-04-28 LAB — CBC WITH DIFFERENTIAL/PLATELET
ABS IMMATURE GRANULOCYTES: 0.09 10*3/uL — AB (ref 0.00–0.07)
BASOS ABS: 0.1 10*3/uL (ref 0.0–0.1)
Basophils Relative: 1 %
Eosinophils Absolute: 0.4 10*3/uL (ref 0.0–0.5)
Eosinophils Relative: 4 %
HCT: 29.5 % — ABNORMAL LOW (ref 36.0–46.0)
HEMOGLOBIN: 8.7 g/dL — AB (ref 12.0–15.0)
IMMATURE GRANULOCYTES: 1 %
LYMPHS PCT: 15 %
Lymphs Abs: 1.5 10*3/uL (ref 0.7–4.0)
MCH: 25.4 pg — AB (ref 26.0–34.0)
MCHC: 29.5 g/dL — ABNORMAL LOW (ref 30.0–36.0)
MCV: 86 fL (ref 80.0–100.0)
MONO ABS: 0.6 10*3/uL (ref 0.1–1.0)
Monocytes Relative: 7 %
NEUTROS ABS: 7 10*3/uL (ref 1.7–7.7)
NEUTROS PCT: 72 %
Platelets: 250 10*3/uL (ref 150–400)
RBC: 3.43 MIL/uL — AB (ref 3.87–5.11)
RDW: 15 % (ref 11.5–15.5)
WBC: 9.8 10*3/uL (ref 4.0–10.5)
nRBC: 0 % (ref 0.0–0.2)

## 2018-04-28 LAB — COMPREHENSIVE METABOLIC PANEL
ALT: 9 U/L (ref 0–44)
ANION GAP: 5 (ref 5–15)
AST: 19 U/L (ref 15–41)
Albumin: 2.2 g/dL — ABNORMAL LOW (ref 3.5–5.0)
Alkaline Phosphatase: 44 U/L (ref 38–126)
BUN: 39 mg/dL — ABNORMAL HIGH (ref 8–23)
CHLORIDE: 123 mmol/L — AB (ref 98–111)
CO2: 18 mmol/L — AB (ref 22–32)
Calcium: 7.7 mg/dL — ABNORMAL LOW (ref 8.9–10.3)
Creatinine, Ser: 1.77 mg/dL — ABNORMAL HIGH (ref 0.44–1.00)
GFR calc non Af Amer: 26 mL/min — ABNORMAL LOW (ref >60)
GFR, EST AFRICAN AMERICAN: 30 mL/min — AB (ref >60)
Glucose, Bld: 85 mg/dL (ref 70–99)
Potassium: 4.2 mmol/L (ref 3.5–5.1)
SODIUM: 146 mmol/L — AB (ref 135–145)
Total Bilirubin: 0.9 mg/dL (ref 0.3–1.2)
Total Protein: 5.3 g/dL — ABNORMAL LOW (ref 6.5–8.1)

## 2018-04-28 LAB — MAGNESIUM: Magnesium: 2 mg/dL (ref 1.7–2.4)

## 2018-04-28 SURGERY — EGD (ESOPHAGOGASTRODUODENOSCOPY)
Anesthesia: Moderate Sedation

## 2018-04-28 MED ORDER — LIDOCAINE VISCOUS HCL 2 % MT SOLN
OROMUCOSAL | Status: AC
  Filled 2018-04-28: qty 15

## 2018-04-28 MED ORDER — SODIUM CHLORIDE 0.9 % IV SOLN
8.0000 mg/h | INTRAVENOUS | Status: DC
  Administered 2018-04-28 – 2018-04-29 (×3): 8 mg/h via INTRAVENOUS
  Filled 2018-04-28 (×8): qty 80

## 2018-04-28 MED ORDER — PANTOPRAZOLE SODIUM 40 MG IV SOLR
40.0000 mg | Freq: Two times a day (BID) | INTRAVENOUS | Status: DC
  Filled 2018-04-28: qty 40

## 2018-04-28 MED ORDER — SODIUM CHLORIDE 0.9 % IV SOLN: INTRAVENOUS | Status: DC

## 2018-04-28 MED ORDER — PANTOPRAZOLE SODIUM 40 MG IV SOLR
40.0000 mg | Freq: Once | INTRAVENOUS | Status: AC
  Administered 2018-04-28: 40 mg via INTRAVENOUS
  Filled 2018-04-28 (×2): qty 40

## 2018-04-28 MED ORDER — STERILE WATER FOR IRRIGATION IR SOLN
Status: DC | PRN
  Administered 2018-04-28: 2.5 mL

## 2018-04-28 MED ORDER — MIDAZOLAM HCL 5 MG/5ML IJ SOLN
INTRAMUSCULAR | Status: DC | PRN
  Administered 2018-04-28 (×2): 1 mg via INTRAVENOUS

## 2018-04-28 MED ORDER — MIDAZOLAM HCL 5 MG/5ML IJ SOLN
INTRAMUSCULAR | Status: AC
  Filled 2018-04-28: qty 10

## 2018-04-28 MED ORDER — MEPERIDINE HCL 50 MG/ML IJ SOLN
INTRAMUSCULAR | Status: AC
  Filled 2018-04-28: qty 1

## 2018-04-28 MED ORDER — VITAMIN K1 10 MG/ML IJ SOLN
5.0000 mg | Freq: Once | INTRAMUSCULAR | Status: AC
  Administered 2018-04-28: 5 mg via SUBCUTANEOUS
  Filled 2018-04-28: qty 1

## 2018-04-28 MED ORDER — LIDOCAINE VISCOUS HCL 2 % MT SOLN
OROMUCOSAL | Status: DC | PRN
  Administered 2018-04-28: 5 mL via OROMUCOSAL

## 2018-04-28 MED ORDER — MEPERIDINE HCL 50 MG/ML IJ SOLN
INTRAMUSCULAR | Status: DC | PRN
  Administered 2018-04-28: 20 mg via INTRAVENOUS

## 2018-04-28 NOTE — Op Note (Addendum)
[facility] Patient Name: [name] Procedure Date: 04/28/2018 9:03 AM MRN: [id number] Date of Birth: Aug 08, 1934 Attending MD: Hildred Laser , MD CSN: [id number] Age: 83 Admit Type: Inpatient Procedure:                Upper GI endoscopy Indications:              Suspected upper gastrointestinal bleeding Providers:                Hildred Laser, MD, Gwynneth Albright RN, RN,                            Randa Spike, Technician Referring MD:             Jasper Loser. Luan Pulling, MD Medicines:                Lidocaine jelly, Meperidine 20 mg IV, Midazolam 2                            mg IV Complications:            No immediate complications. Estimated Blood Loss:     Estimated blood loss: none. Procedure:                Pre-Anesthesia Assessment:                           - Prior to the procedure, a History and Physical                            was performed, and patient medications and                            allergies were reviewed. The patient's tolerance of                            previous anesthesia was also reviewed. The risks                            and benefits of the procedure and the sedation                            options and risks were discussed with the patient.                            All questions were answered, and informed consent                            was obtained. Prior Anticoagulants: The patient                            last took naproxen 3 days and Plavix (clopidogrel)                            3 days prior to the procedure. ASA Grade  Assessment: III - A patient with severe systemic                            disease. After reviewing the risks and benefits,                            the patient was deemed in satisfactory condition to                            undergo the procedure.                           After obtaining informed consent, the endoscope was                            passed under  direct vision. Throughout the                            procedure, the patient's blood pressure, pulse, and                            oxygen saturations were monitored continuously. The                            [id number]-[id number] ([id number]) scope was introduced through the                            mouth, and advanced to the second part of duodenum.                            The upper GI endoscopy was accomplished without                            difficulty. The patient tolerated the procedure                            well. Scope In: 10:00:20 AM Scope Out: 10:13:20 AM Total Procedure Duration: 0 hours 13 minutes 0 seconds  Findings:      The examined esophagus was normal.      A high-grade of narrowing and non-obstructing Schatzki ring was found at       the gastroesophageal junction.      A 5 cm hiatal hernia was present.      Hematin (altered blood/coffee-ground-like material) was found in the       gastric body.      A few, non-bleeding erosions were found in the gastric antrum and in the       prepyloric region of the stomach. There were no stigmata of recent       bleeding.      A healed ulcer was found in the prepyloric region of the stomach.      One oozing cratered duodenal ulcer with adherent clot was found in the       distal part of duodenal bulb. The lesion was twelve mm by fifteen mm in       largest dimension. To stop active  bleeding, hemostatic spray was       deployed. There was no bleeding at the end of the procedure.      The second portion of the duodenum was normal. Impression:               - Normal esophagus.                           - High-grade of narrowing and non-obstructing                            Schatzki ring.                           - 5 cm hiatal hernia.                           - Hematin (altered blood/coffee-ground-like                            material) in the gastric body.                           - Non-bleeding erosive gastropathy.                            - Scar in the prepyloric region of the stomach.                           - One oozing duodenal ulcer with adherent clot.                            Hemostatic spray applied.                           - Normal second portion of the duodenum.                           - No specimens collected. Moderate Sedation:      Moderate (conscious) sedation was administered by the endoscopy nurse       and supervised by the endoscopist. The following parameters were       monitored: oxygen saturation, heart rate, blood pressure, CO2       capnography and response to care. Total physician intraservice time was       19 minutes. Recommendation:           - Return patient to hospital ward for ongoing care.                           - Full liquid diet today.                           - Continue present medications.                           - No aspirin, ibuprofen, naproxen, or other  non-steroidal anti-inflammatory drugs.                           - Change pantoprazole to infusion for 72 hours.                           - Vit K 5 mg SQ.                           - Perform an H. pylori serology today.                           - Repeat upper endoscopy with ED in 8 weeks. Procedure Code(s):        --- Professional ---                           [phone], Esophagogastroduodenoscopy, flexible,                            transoral; with control of bleeding, any method                           G0500, Moderate sedation services provided by the                            same physician or other qualified health care                            professional performing a gastrointestinal                            endoscopic service that sedation supports,                            requiring the presence of an independent trained                            observer to assist in the monitoring of the                            patient's level of consciousness and  physiological                            status; initial 15 minutes of intra-service time;                            patient age 76 years or older (additional time may                            be reported with [phone], as appropriate) Diagnosis Code(s):        --- Professional ---                           K22.2, Esophageal obstruction  K44.9, Diaphragmatic hernia without obstruction or                            gangrene                           K92.2, Gastrointestinal hemorrhage, unspecified                           K31.89, Other diseases of stomach and duodenum                           K26.4, Chronic or unspecified duodenal ulcer with                            hemorrhage CPT copyright 2018 [employer]. All rights reserved. The codes documented in this report are preliminary and upon coder review may  be revised to meet current compliance requirements. Hildred Laser, MD Hildred Laser, MD 04/28/2018 10:34:54 AM This report has been signed electronically. Number of Addenda: 0

## 2018-04-28 NOTE — Progress Notes (Signed)
Brief EGD note:  Prominent Schatzki's ring not manipulated today. 5 cm sized hiatal hernia. Prepyloric scar in erosive gastritis. Large distal bulbar ulcer covered with clot and fresh blood. Post bulbar duodenal mucosa normal. Ulcer/bleeding site sealed with hemospray.

## 2018-04-28 NOTE — Progress Notes (Signed)
Subjective: She was admitted with GI bleeding and acute renal failure.  She has not been eating well at home for the last 3 months.  She is set for EGD.  She was seen by Dr. Laural Golden yesterday in consultation noted and appreciated  Objective: Vital signs in last 24 hours: Temp:  [97.7 F (36.5 C)-98.3 F (36.8 C)] 97.7 F (36.5 C) (11/24 0947) Pulse Rate:  [77-91] 81 (11/24 1000) Resp:  [11-18] 11 (11/24 1000) BP: (76-149)/(32-68) 99/64 (11/24 1000) SpO2:  [95 %-100 %] 96 % (11/24 1000) Weight change:     Intake/Output from previous day: 11/23 0701 - 11/24 0700 In: 3955.7 [P.O.:360; I.V.:2883.3; Blood:612; IV Piggyback:100.4] Out: -   PHYSICAL EXAM General appearance: alert and cooperative Resp: clear to auscultation bilaterally Cardio: regular rate and rhythm, S1, S2 normal, no murmur, click, rub or gallop GI: soft, non-tender; bowel sounds normal; no masses,  no organomegaly Extremities: extremities normal, atraumatic, no cyanosis or edema  Lab Results:  Results for orders placed or performed during the hospital encounter of 04/26/18 (from the past 48 hour(s))  Comprehensive metabolic panel     Status: Abnormal   Collection Time: 04/27/18  6:56 AM  Result Value Ref Range   Sodium 145 135 - 145 mmol/L   Potassium 4.4 3.5 - 5.1 mmol/L    Comment: DELTA CHECK NOTED   Chloride 120 (H) 98 - 111 mmol/L   CO2 19 (L) 22 - 32 mmol/L   Glucose, Bld 97 70 - 99 mg/dL   BUN 54 (H) 8 - 23 mg/dL   Creatinine, Ser 2.01 (H) 0.44 - 1.00 mg/dL   Calcium 7.6 (L) 8.9 - 10.3 mg/dL   Total Protein 5.2 (L) 6.5 - 8.1 g/dL   Albumin 2.1 (L) 3.5 - 5.0 g/dL   AST 15 15 - 41 U/L   ALT 10 0 - 44 U/L   Alkaline Phosphatase 45 38 - 126 U/L   Total Bilirubin 0.7 0.3 - 1.2 mg/dL   GFR calc non Af Amer 22 (L) >60 mL/min   GFR calc Af Amer 26 (L) >60 mL/min    Comment: (NOTE) The eGFR has been calculated using the CKD EPI equation. This calculation has not been validated in all clinical  situations. eGFR's persistently <60 mL/min signify possible Chronic Kidney Disease.    Anion gap 6 5 - 15    Comment: Performed at [facility], [address], [place], [place] [address]  Magnesium     Status: None   Collection Time: 04/27/18  6:56 AM  Result Value Ref Range   Magnesium 1.8 1.7 - 2.4 mg/dL    Comment: Performed at [facility], [address], [place], [place] [address]  Occult blood card to lab, stool     Status: Abnormal   Collection Time: 04/27/18  8:35 AM  Result Value Ref Range   Fecal Occult Bld POSITIVE (A) NEGATIVE    Comment: Performed at [facility], [address] [place], [place] [address]  CBC with Differential/Platelet     Status: Abnormal   Collection Time: 04/27/18  8:56 AM  Result Value Ref Range   WBC 13.3 (H) 4.0 - 10.5 K/uL   RBC 2.60 (L) 3.87 - 5.11 MIL/uL   Hemoglobin 6.1 (LL) 12.0 - 15.0 g/dL    Comment: This critical result has verified and been called to [facility] by Lorette Ang on 11 23 2019 at 0922, and has been read back. REPEATED TO VERIFY DELTA CHECK NOTED This critical result  has verified and been called to [facility] by Lorette Ang on 11 23 2019 at 0926, and has been read back. DELTA CHECK NOTED REPEATED TO VERIFY    HCT 21.3 (L) 36.0 - 46.0 %   MCV 81.9 80.0 - 100.0 fL   MCH 23.5 (L) 26.0 - 34.0 pg   MCHC 28.6 (L) 30.0 - 36.0 g/dL   RDW 14.7 11.5 - 15.5 %   Platelets 412 (H) 150 - 400 K/uL   nRBC 0.0 0.0 - 0.2 %   Neutrophils Relative % 78 %   Neutro Abs 10.5 (H) 1.7 - 7.7 K/uL   Lymphocytes Relative 14 %   Lymphs Abs 1.8 0.7 - 4.0 K/uL   Monocytes Relative 5 %   Monocytes Absolute 0.7 0.1 - 1.0 K/uL   Eosinophils Relative 1 %   Eosinophils Absolute 0.1 0.0 - 0.5 K/uL   Basophils Relative 1 %   Basophils Absolute 0.1 0.0 - 0.1 K/uL   Immature Granulocytes 1 %   Abs Immature Granulocytes 0.08 (H) 0.00 - 0.07 K/uL    Comment: Performed at [facility], [address] [place], [place] [address]  ABO/Rh     Status: None    Collection Time: 04/27/18  8:56 AM  Result Value Ref Range   ABO/RH(D)      O POS Performed at [facility], [address] [place], [place] [address]   Type and screen [facility]     Status: None   Collection Time: 04/27/18  9:50 AM  Result Value Ref Range   ABO/RH(D) O POS    Antibody Screen NEG    Sample Expiration 04/30/2018    Unit Number [id number]    Blood Component Type RED CELLS,LR    Unit division 00    Status of Unit ISSUED,FINAL    Transfusion Status OK TO TRANSFUSE    Crossmatch Result Compatible    Unit Number [id number]    Blood Component Type RED CELLS,LR    Unit division 00    Status of Unit ISSUED,FINAL    Transfusion Status OK TO TRANSFUSE    Crossmatch Result      Compatible Performed at [facility], [address], [place], [place] [address]   Prepare RBC     Status: None   Collection Time: 04/27/18  9:50 AM  Result Value Ref Range   Order Confirmation      ORDER PROCESSED BY BLOOD BANK Performed at [facility], [address] [place], [place] [address]   Protime-INR     Status: Abnormal   Collection Time: 04/27/18  7:15 PM  Result Value Ref Range   Prothrombin Time 16.1 (H) 11.4 - 15.2 seconds   INR 1.31     Comment: Performed at [facility], [address] [place], [place] [address]  Hemoglobin and hematocrit, blood     Status: Abnormal   Collection Time: 04/27/18  7:15 PM  Result Value Ref Range   Hemoglobin 8.6 (L) 12.0 - 15.0 g/dL    Comment: POST TRANSFUSION SPECIMEN   HCT 28.9 (L) 36.0 - 46.0 %    Comment: Performed at [facility], [address] [place], [place] [address]  Comprehensive metabolic panel     Status: Abnormal   Collection Time: 04/28/18  5:48 AM  Result Value Ref Range   Sodium 146 (H) 135 - 145 mmol/L   Potassium 4.2 3.5 - 5.1 mmol/L   Chloride 123 (H) 98 - 111 mmol/L   CO2 18 (L)  22 - 32 mmol/L   Glucose, Bld 85 70 - 99 mg/dL   BUN 39 (H) 8 - 23 mg/dL   Creatinine, Ser 1.77 (H) 0.44 - 1.00 mg/dL    Calcium 7.7 (L) 8.9 - 10.3 mg/dL   Total Protein 5.3 (L) 6.5 - 8.1 g/dL   Albumin 2.2 (L) 3.5 - 5.0 g/dL   AST 19 15 - 41 U/L   ALT 9 0 - 44 U/L   Alkaline Phosphatase 44 38 - 126 U/L   Total Bilirubin 0.9 0.3 - 1.2 mg/dL   GFR calc non Af Amer 26 (L) >60 mL/min   GFR calc Af Amer 30 (L) >60 mL/min    Comment: (NOTE) The eGFR has been calculated using the CKD EPI equation. This calculation has not been validated in all clinical situations. eGFR's persistently <60 mL/min signify possible Chronic Kidney Disease.    Anion gap 5 5 - 15    Comment: Performed at [facility], [address] [place], [place] [address]  Magnesium     Status: None   Collection Time: 04/28/18  5:48 AM  Result Value Ref Range   Magnesium 2.0 1.7 - 2.4 mg/dL    Comment: Performed at [facility], [address], [place], [place] [address]  CBC WITH DIFFERENTIAL     Status: Abnormal   Collection Time: 04/28/18  5:48 AM  Result Value Ref Range   WBC 9.8 4.0 - 10.5 K/uL   RBC 3.43 (L) 3.87 - 5.11 MIL/uL   Hemoglobin 8.7 (L) 12.0 - 15.0 g/dL   HCT 29.5 (L) 36.0 - 46.0 %   MCV 86.0 80.0 - 100.0 fL   MCH 25.4 (L) 26.0 - 34.0 pg   MCHC 29.5 (L) 30.0 - 36.0 g/dL   RDW 15.0 11.5 - 15.5 %   Platelets 250 150 - 400 K/uL   nRBC 0.0 0.0 - 0.2 %   Neutrophils Relative % 72 %   Neutro Abs 7.0 1.7 - 7.7 K/uL   Lymphocytes Relative 15 %   Lymphs Abs 1.5 0.7 - 4.0 K/uL   Monocytes Relative 7 %   Monocytes Absolute 0.6 0.1 - 1.0 K/uL   Eosinophils Relative 4 %   Eosinophils Absolute 0.4 0.0 - 0.5 K/uL   Basophils Relative 1 %   Basophils Absolute 0.1 0.0 - 0.1 K/uL   Immature Granulocytes 1 %   Abs Immature Granulocytes 0.09 (H) 0.00 - 0.07 K/uL    Comment: Performed at [facility], [address], [place], [place] [address]    ABGS No results for input(s): PHART, PO2ART, TCO2, HCO3 in the last 72 hours.  Invalid input(s): PCO2 CULTURES Recent Results (from the past 240 hour(s))  Urine culture     Status:  None   Collection Time: 04/26/18  8:10 AM  Result Value Ref Range Status   Specimen Description   Final    URINE, CATHETERIZED Performed at [facility], [address], [place], [place] [address]    Special Requests   Final    NONE Performed at [facility], [address] [place], [place] [address]    Culture   Final    NO GROWTH Performed at [facility], [place] [address], [place], [place] [address]    Report Status 04/27/2018 FINAL  Final   Studies/Results: Portable Chest 1 View  Result Date: 04/27/2018 CLINICAL DATA:  Follow-up exam.  Altered mental status EXAM: PORTABLE CHEST 1 VIEW COMPARISON:  Chest radiograph 04/26/2018 FINDINGS: Stable cardiomegaly.  Aortic atherosclerosis. Low lung volumes. Bibasilar atelectasis. No pleural effusion or pneumothorax. Chondroid lesion proximal left humerus. IMPRESSION: No acute cardiopulmonary process. Low lung volumes with basilar atelectasis. Electronically Signed   By: Lovey Newcomer M.D.   On: 04/27/2018 08:59    Medications:  Prior to Admission:  Medications Prior to Admission  Medication Sig Dispense Refill Last Dose  . clopidogrel (PLAVIX) 75 MG tablet Take 1 tablet (75 mg total) by mouth daily. 30 tablet 2 04/25/2018 at 0700  . latanoprost (XALATAN) 0.005 % ophthalmic solution Place 1 drop into both eyes at bedtime.   04/25/2018 at 2300  . lisinopril-hydrochlorothiazide (PRINZIDE,ZESTORETIC) 20-12.5 MG per tablet Take 1 tablet by mouth daily.     04/25/2018 at 0700  . Multiple Vitamins-Minerals (CENTRUM ULTRA WOMENS) TABS Take 1 tablet by mouth daily.     04/25/2018 at 0700  . rosuvastatin (CRESTOR) 20 MG tablet Take 20 mg by mouth daily.   04/25/2018 at 2300  . senna (SENOKOT) 8.6 MG tablet Take 2 tablets by mouth at bedtime.    04/25/2018 at 2300   Scheduled: . lidocaine      . meperidine      . midazolam      . [MAR Hold] latanoprost  1 drop Both Eyes QHS  . [MAR Hold] multivitamin with minerals   Oral Daily  . [MAR Hold]  pantoprazole (PROTONIX) IV  40 mg Intravenous Q12H  . [MAR Hold] rosuvastatin  10 mg Oral q1800  . [MAR Hold] senna  2 tablet Oral QHS   Continuous: . sodium chloride 75 mL/hr at 04/27/18 1806  . sodium chloride    . [MAR Hold] cefTRIAXone (ROCEPHIN)  IV Stopped (04/27/18 1517)  . [MAR Hold] sodium chloride     PRN:[MAR Hold] acetaminophen **OR** [MAR Hold] acetaminophen, lidocaine, meperidine, midazolam, [MAR Hold] ondansetron **OR** [MAR Hold] ondansetron (ZOFRAN) IV, simethicone susp in sterile water 1000 mL irrigation, [MAR Hold] traZODone  Assesment: She was admitted with acute renal failure because of dehydration and from GI bleeding.  She has been on Plavix and she had a severe stroke and she is known to still have significant vascular disease.  Plavix has been held.  She has received fluid resuscitation and blood and her renal function is better and her hemoglobin is just less than 9.  She probably has a UTI and is on Rocephin.  Her dehydration is better  She was poorly responsive initially but is closer to baseline now Principal Problem:   Acute renal failure (ARF) ([place]) Active Problems:   History of CVA (cerebrovascular accident)   Hypertension   UTI (urinary tract infection)   Dehydration   Anemia, unspecified   Altered mental state   Leukocytosis   Cerebrovascular disease   Hypokalemia    Plan: Continue treatments.  EGD today.    LOS: 2 days   Miryah Ralls L 04/28/2018, 10:06 AM

## 2018-04-29 DIAGNOSIS — K269 Duodenal ulcer, unspecified as acute or chronic, without hemorrhage or perforation: Secondary | ICD-10-CM | POA: Diagnosis present

## 2018-04-29 LAB — CBC WITH DIFFERENTIAL/PLATELET
ABS IMMATURE GRANULOCYTES: 0.05 10*3/uL (ref 0.00–0.07)
Basophils Absolute: 0.1 10*3/uL (ref 0.0–0.1)
Basophils Relative: 1 %
EOS PCT: 7 %
Eosinophils Absolute: 0.4 10*3/uL (ref 0.0–0.5)
HCT: 26.4 % — ABNORMAL LOW (ref 36.0–46.0)
HEMOGLOBIN: 7.8 g/dL — AB (ref 12.0–15.0)
Immature Granulocytes: 1 %
Lymphocytes Relative: 18 %
Lymphs Abs: 1.2 10*3/uL (ref 0.7–4.0)
MCH: 25.5 pg — AB (ref 26.0–34.0)
MCHC: 29.5 g/dL — AB (ref 30.0–36.0)
MCV: 86.3 fL (ref 80.0–100.0)
MONO ABS: 0.5 10*3/uL (ref 0.1–1.0)
MONOS PCT: 7 %
NEUTROS ABS: 4.5 10*3/uL (ref 1.7–7.7)
Neutrophils Relative %: 66 %
Platelets: 262 10*3/uL (ref 150–400)
RBC: 3.06 MIL/uL — ABNORMAL LOW (ref 3.87–5.11)
RDW: 15.4 % (ref 11.5–15.5)
WBC: 6.8 10*3/uL (ref 4.0–10.5)
nRBC: 0 % (ref 0.0–0.2)

## 2018-04-29 LAB — MAGNESIUM: Magnesium: 1.8 mg/dL (ref 1.7–2.4)

## 2018-04-29 LAB — PREPARE RBC (CROSSMATCH)

## 2018-04-29 LAB — COMPREHENSIVE METABOLIC PANEL
ALK PHOS: 41 U/L (ref 38–126)
ALT: 12 U/L (ref 0–44)
AST: 18 U/L (ref 15–41)
Albumin: 2.2 g/dL — ABNORMAL LOW (ref 3.5–5.0)
Anion gap: 6 (ref 5–15)
BILIRUBIN TOTAL: 0.5 mg/dL (ref 0.3–1.2)
BUN: 26 mg/dL — AB (ref 8–23)
CALCIUM: 7.7 mg/dL — AB (ref 8.9–10.3)
CHLORIDE: 119 mmol/L — AB (ref 98–111)
CO2: 19 mmol/L — ABNORMAL LOW (ref 22–32)
CREATININE: 1.55 mg/dL — AB (ref 0.44–1.00)
GFR, EST AFRICAN AMERICAN: 35 mL/min — AB (ref >60)
GFR, EST NON AFRICAN AMERICAN: 30 mL/min — AB (ref >60)
Glucose, Bld: 85 mg/dL (ref 70–99)
Potassium: 3.6 mmol/L (ref 3.5–5.1)
Sodium: 144 mmol/L (ref 135–145)
TOTAL PROTEIN: 5.2 g/dL — AB (ref 6.5–8.1)

## 2018-04-29 LAB — HEMOGLOBIN AND HEMATOCRIT, BLOOD
HCT: 30.4 % — ABNORMAL LOW (ref 36.0–46.0)
Hemoglobin: 9.3 g/dL — ABNORMAL LOW (ref 12.0–15.0)

## 2018-04-29 MED ORDER — SODIUM CHLORIDE 0.9% IV SOLUTION
Freq: Once | INTRAVENOUS | Status: AC
  Administered 2018-04-29: 11:00:00 via INTRAVENOUS

## 2018-04-29 NOTE — Progress Notes (Signed)
Subjective: She feels much better.  She is eating better.  She has no new complaints.  Her hemoglobin level has dropped more and she is down below 8 so I think is reasonable to give her 1 more unit of blood.  Objective: Vital signs in last 24 hours: Temp:  [97.7 F (36.5 C)-98.1 F (36.7 C)] 98.1 F (36.7 C) (11/25 0453) Pulse Rate:  [64-84] 69 (11/25 0453) Resp:  [9-18] 15 (11/25 0453) BP: (99-149)/(47-78) 132/57 (11/25 0453) SpO2:  [97 %-100 %] 98 % (11/25 0453) Weight change:     Intake/Output from previous day: 11/24 0701 - 11/25 0700 In: 2372.5 [P.O.:1520; I.V.:708.7; IV Piggyback:143.8] Out: -   PHYSICAL EXAM General appearance: alert, cooperative and no distress Resp: clear to auscultation bilaterally Cardio: regular rate and rhythm, S1, S2 normal, no murmur, click, rub or gallop GI: soft, non-tender; bowel sounds normal; no masses,  no organomegaly Extremities: extremities normal, atraumatic, no cyanosis or edema  Lab Results:  Results for orders placed or performed during the hospital encounter of 04/26/18 (from the past 48 hour(s))  CBC with Differential/Platelet     Status: Abnormal   Collection Time: 04/27/18  8:56 AM  Result Value Ref Range   WBC 13.3 (H) 4.0 - 10.5 K/uL   RBC 2.60 (L) 3.87 - 5.11 MIL/uL   Hemoglobin 6.1 (LL) 12.0 - 15.0 g/dL    Comment: This critical result has verified and been called to [facility] by Lorette Ang on 11 23 2019 at 0922, and has been read back. REPEATED TO VERIFY DELTA CHECK NOTED This critical result has verified and been called to DISHMON,M by Lorette Ang on 11 23 2019 at 0926, and has been read back. DELTA CHECK NOTED REPEATED TO VERIFY    HCT 21.3 (L) 36.0 - 46.0 %   MCV 81.9 80.0 - 100.0 fL   MCH 23.5 (L) 26.0 - 34.0 pg   MCHC 28.6 (L) 30.0 - 36.0 g/dL   RDW 14.7 11.5 - 15.5 %   Platelets 412 (H) 150 - 400 K/uL   nRBC 0.0 0.0 - 0.2 %   Neutrophils Relative % 78 %   Neutro Abs 10.5 (H) 1.7 - 7.7 K/uL    Lymphocytes Relative 14 %   Lymphs Abs 1.8 0.7 - 4.0 K/uL   Monocytes Relative 5 %   Monocytes Absolute 0.7 0.1 - 1.0 K/uL   Eosinophils Relative 1 %   Eosinophils Absolute 0.1 0.0 - 0.5 K/uL   Basophils Relative 1 %   Basophils Absolute 0.1 0.0 - 0.1 K/uL   Immature Granulocytes 1 %   Abs Immature Granulocytes 0.08 (H) 0.00 - 0.07 K/uL    Comment: Performed at [facility], [address], [place], [place] [address]  ABO/Rh     Status: None   Collection Time: 04/27/18  8:56 AM  Result Value Ref Range   ABO/RH(D)      Jenetta Downer POS Performed at [facility], [address] [place], [place] [address]   Type and screen [facility]     Status: None   Collection Time: 04/27/18  9:50 AM  Result Value Ref Range   ABO/RH(D) O POS    Antibody Screen NEG    Sample Expiration 04/30/2018    Unit Number [id number]    Blood Component Type RED CELLS,LR    Unit division 00    Status of Unit ISSUED,FINAL    Transfusion Status OK TO TRANSFUSE    Crossmatch Result Compatible  Unit Number [id number]    Blood Component Type RED CELLS,LR    Unit division 00    Status of Unit ISSUED,FINAL    Transfusion Status OK TO TRANSFUSE    Crossmatch Result      Compatible Performed at [facility], [address] [place], [place] [address]   Prepare RBC     Status: None   Collection Time: 04/27/18  9:50 AM  Result Value Ref Range   Order Confirmation      ORDER PROCESSED BY BLOOD BANK Performed at [facility], [address], [place], [place] [address]   Protime-INR     Status: Abnormal   Collection Time: 04/27/18  7:15 PM  Result Value Ref Range   Prothrombin Time 16.1 (H) 11.4 - 15.2 seconds   INR 1.31     Comment: Performed at [facility], [address] [place], [place] [address]  Hemoglobin and hematocrit, blood     Status: Abnormal   Collection Time: 04/27/18  7:15 PM  Result Value Ref Range   Hemoglobin 8.6 (L) 12.0 - 15.0 g/dL    Comment: POST TRANSFUSION SPECIMEN   HCT 28.9  (L) 36.0 - 46.0 %    Comment: Performed at [facility], [address] [place], [place] [address]  Comprehensive metabolic panel     Status: Abnormal   Collection Time: 04/28/18  5:48 AM  Result Value Ref Range   Sodium 146 (H) 135 - 145 mmol/L   Potassium 4.2 3.5 - 5.1 mmol/L   Chloride 123 (H) 98 - 111 mmol/L   CO2 18 (L) 22 - 32 mmol/L   Glucose, Bld 85 70 - 99 mg/dL   BUN 39 (H) 8 - 23 mg/dL   Creatinine, Ser 1.77 (H) 0.44 - 1.00 mg/dL   Calcium 7.7 (L) 8.9 - 10.3 mg/dL   Total Protein 5.3 (L) 6.5 - 8.1 g/dL   Albumin 2.2 (L) 3.5 - 5.0 g/dL   AST 19 15 - 41 U/L   ALT 9 0 - 44 U/L   Alkaline Phosphatase 44 38 - 126 U/L   Total Bilirubin 0.9 0.3 - 1.2 mg/dL   GFR calc non Af Amer 26 (L) >60 mL/min   GFR calc Af Amer 30 (L) >60 mL/min    Comment: (NOTE) The eGFR has been calculated using the CKD EPI equation. This calculation has not been validated in all clinical situations. eGFR's persistently <60 mL/min signify possible Chronic Kidney Disease.    Anion gap 5 5 - 15    Comment: Performed at [facility], [address], [place], [place] [address]  Magnesium     Status: None   Collection Time: 04/28/18  5:48 AM  Result Value Ref Range   Magnesium 2.0 1.7 - 2.4 mg/dL    Comment: Performed at [facility], [address], [place], [place] [address]  CBC WITH DIFFERENTIAL     Status: Abnormal   Collection Time: 04/28/18  5:48 AM  Result Value Ref Range   WBC 9.8 4.0 - 10.5 K/uL   RBC 3.43 (L) 3.87 - 5.11 MIL/uL   Hemoglobin 8.7 (L) 12.0 - 15.0 g/dL   HCT 29.5 (L) 36.0 - 46.0 %   MCV 86.0 80.0 - 100.0 fL   MCH 25.4 (L) 26.0 - 34.0 pg   MCHC 29.5 (L) 30.0 - 36.0 g/dL   RDW 15.0 11.5 - 15.5 %   Platelets 250 150 - 400 K/uL   nRBC 0.0 0.0 - 0.2 %  Neutrophils Relative % 72 %   Neutro Abs 7.0 1.7 - 7.7 K/uL   Lymphocytes Relative 15 %   Lymphs Abs 1.5 0.7 - 4.0 K/uL   Monocytes Relative 7 %   Monocytes Absolute 0.6 0.1 - 1.0 K/uL   Eosinophils Relative 4 %    Eosinophils Absolute 0.4 0.0 - 0.5 K/uL   Basophils Relative 1 %   Basophils Absolute 0.1 0.0 - 0.1 K/uL   Immature Granulocytes 1 %   Abs Immature Granulocytes 0.09 (H) 0.00 - 0.07 K/uL    Comment: Performed at [facility], [address] [place], [place] [address]  Comprehensive metabolic panel     Status: Abnormal   Collection Time: 04/29/18  5:59 AM  Result Value Ref Range   Sodium 144 135 - 145 mmol/L   Potassium 3.6 3.5 - 5.1 mmol/L   Chloride 119 (H) 98 - 111 mmol/L   CO2 19 (L) 22 - 32 mmol/L   Glucose, Bld 85 70 - 99 mg/dL   BUN 26 (H) 8 - 23 mg/dL   Creatinine, Ser 1.55 (H) 0.44 - 1.00 mg/dL   Calcium 7.7 (L) 8.9 - 10.3 mg/dL   Total Protein 5.2 (L) 6.5 - 8.1 g/dL   Albumin 2.2 (L) 3.5 - 5.0 g/dL   AST 18 15 - 41 U/L   ALT 12 0 - 44 U/L   Alkaline Phosphatase 41 38 - 126 U/L   Total Bilirubin 0.5 0.3 - 1.2 mg/dL   GFR calc non Af Amer 30 (L) >60 mL/min   GFR calc Af Amer 35 (L) >60 mL/min    Comment: (NOTE) The eGFR has been calculated using the CKD EPI equation. This calculation has not been validated in all clinical situations. eGFR's persistently <60 mL/min signify possible Chronic Kidney Disease.    Anion gap 6 5 - 15    Comment: Performed at [facility], [address] [place], [place] [address]  Magnesium     Status: None   Collection Time: 04/29/18  5:59 AM  Result Value Ref Range   Magnesium 1.8 1.7 - 2.4 mg/dL    Comment: Performed at [facility], [address] [place], [place] [address]  CBC WITH DIFFERENTIAL     Status: Abnormal   Collection Time: 04/29/18  5:59 AM  Result Value Ref Range   WBC 6.8 4.0 - 10.5 K/uL   RBC 3.06 (L) 3.87 - 5.11 MIL/uL   Hemoglobin 7.8 (L) 12.0 - 15.0 g/dL   HCT 26.4 (L) 36.0 - 46.0 %   MCV 86.3 80.0 - 100.0 fL   MCH 25.5 (L) 26.0 - 34.0 pg   MCHC 29.5 (L) 30.0 - 36.0 g/dL   RDW 15.4 11.5 - 15.5 %   Platelets 262 150 - 400 K/uL   nRBC 0.0 0.0 - 0.2 %   Neutrophils Relative % 66 %   Neutro Abs 4.5 1.7 - 7.7 K/uL    Lymphocytes Relative 18 %   Lymphs Abs 1.2 0.7 - 4.0 K/uL   Monocytes Relative 7 %   Monocytes Absolute 0.5 0.1 - 1.0 K/uL   Eosinophils Relative 7 %   Eosinophils Absolute 0.4 0.0 - 0.5 K/uL   Basophils Relative 1 %   Basophils Absolute 0.1 0.0 - 0.1 K/uL   Immature Granulocytes 1 %   Abs Immature Granulocytes 0.05 0.00 - 0.07 K/uL    Comment: Performed at [facility], [address] [place], [place] [address]    ABGS No results for  input(s): PHART, PO2ART, TCO2, HCO3 in the last 72 hours.  Invalid input(s): PCO2 CULTURES Recent Results (from the past 240 hour(s))  Urine culture     Status: None   Collection Time: 04/26/18  8:10 AM  Result Value Ref Range Status   Specimen Description   Final    URINE, CATHETERIZED Performed at [facility], [address] [place], [place] [address]    Special Requests   Final    NONE Performed at [facility], [address] [place], [place] [address]    Culture   Final    NO GROWTH Performed at [facility], [place] [address] [place], [place] [address]    Report Status 04/27/2018 FINAL  Final   Studies/Results: No results found.  Medications:  Prior to Admission:  Medications Prior to Admission  Medication Sig Dispense Refill Last Dose  . clopidogrel (PLAVIX) 75 MG tablet Take 1 tablet (75 mg total) by mouth daily. 30 tablet 2 04/25/2018 at 0700  . latanoprost (XALATAN) 0.005 % ophthalmic solution Place 1 drop into both eyes at bedtime.   04/25/2018 at 2300  . lisinopril-hydrochlorothiazide (PRINZIDE,ZESTORETIC) 20-12.5 MG per tablet Take 1 tablet by mouth daily.     04/25/2018 at 0700  . Multiple Vitamins-Minerals (CENTRUM ULTRA WOMENS) TABS Take 1 tablet by mouth daily.     04/25/2018 at 0700  . rosuvastatin (CRESTOR) 20 MG tablet Take 20 mg by mouth daily.   04/25/2018 at 2300  . senna (SENOKOT) 8.6 MG tablet Take 2 tablets by mouth at bedtime.    04/25/2018 at 2300   Scheduled: . sodium chloride   Intravenous Once  .  latanoprost  1 drop Both Eyes QHS  . multivitamin with minerals   Oral Daily  . [START ON 05/01/2018] pantoprazole  40 mg Intravenous Q12H  . rosuvastatin  10 mg Oral q1800  . senna  2 tablet Oral QHS   Continuous: . sodium chloride 75 mL/hr at 04/27/18 1806  . cefTRIAXone (ROCEPHIN)  IV 1 g (04/28/18 1615)  . pantoprozole (PROTONIX) infusion 8 mg/hr (04/29/18 0757)  . sodium chloride     OXB:[id number] **OR** acetaminophen, ondansetron **OR** ondansetron (ZOFRAN) IV, traZODone  Assesment: She was admitted with acute GI bleeding and acute renal failure.  She has duodenal ulcer.  Her hemoglobin level has continued to drift down.  She has acute blood loss anemia and she is going to require another unit of blood.  She has had generalized failure to thrive and that may be related to the peptic ulcer disease and that does seem to be better at least she is eating better now.  She has cerebrovascular disease and has been on Plavix for that which is been held because of her bleeding  She had acute renal failure and dehydration which are better Principal Problem:   Acute renal failure (ARF) ([place]) Active Problems:   History of CVA (cerebrovascular accident)   Hypertension   UTI (urinary tract infection)   Dehydration   Anemia, unspecified   Altered mental state   Leukocytosis   Cerebrovascular disease   Hypokalemia    Plan: Continue treatments.  She will have another unit of blood today.  Advance diet per GI.    LOS: 3 days   Tuesday Terlecki L 04/29/2018, 8:46 AM

## 2018-04-29 NOTE — Care Management Important Message (Signed)
Important Message  Patient Details  Name: [name] MRN: [id number] Date of Birth: 09/19/1934   Medicare Important Message Given:  Yes    Shelda Altes 04/29/2018, 1:02 PM

## 2018-04-29 NOTE — Evaluation (Signed)
Physical Therapy Evaluation Patient Details Name: [name] MRN: [id number] DOB: 06-Oct-1934 Today's Date: 04/29/2018   History of Present Illness  Denya Buckingham is an 74tyo female who comes to [facility] on 11/22 c AMS, husband reports 36M general decline in function and activity strength; pt c UTI 11/12, noted Hb drop this admission compared to previous, and now s/p EGD (11/24). Pt started this date s/p 3units PRBC and Hb: 7.8, HCT: 26.4, now receiving a 4th unit of PRBC.   Clinical Impression  Pt admitted with above diagnosis. Pt currently with functional limitations due to the deficits listed below (see "PT Problem List"). Upon entry, pt in mid-transfer from bed to [facility] with husband assisting and bed alarm sounding; PT immediately assisted both to assure safe transfer without line compromise. The pt is awake and agreeable to participate. 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 After toiletting, NA and husband assisted patient back to bed, author assisting with line management and safe repositioning of patient in bed. Functional mobility assessment demonstrates increased effort/time requirements, fair tolerance, and only minimal intermittent need for physical assistance, whereas the patient performed these at a higher level of independence PTA. Husband, a retired Therapist, sports, attests confidence in pt's return to home in his care. He thinks that rehab setting would be best for her, as she has easily been excessively fatigued in STR and OPPT settings more recently. Pt will benefit from skilled PT intervention to increase independence and safety with basic mobility in preparation for discharge to the venue listed below.       Follow Up Recommendations Home health PT;Supervision for mobility/OOB;Supervision - Intermittent    Equipment Recommendations  None recommended by PT(pt has all needed DME at this time)    Recommendations for Other Services        Precautions / Restrictions Precautions Precautions: Fall Restrictions Weight Bearing Restrictions: No      Mobility  Bed Mobility Overal bed mobility: Needs Assistance Bed Mobility: Supine to Sit;Sit to Supine     Supine to sit: Min assist;HOB elevated Sit to supine: Mod assist   General bed mobility comments: moves well, but phjysically weak in general   Transfers Overall transfer level: Needs assistance Equipment used: 1 person hand held assist Transfers: Stand Pivot Transfers   Stand pivot transfers: Min assist       General transfer comment: performed twice in session to [facility] then back to bed, assisted by husband, PT managing lines for safety.   Ambulation/Gait Ambulation/Gait assistance: (deferred at this time, as patient is finishing up her last unit PRBC )              Stairs            Wheelchair Mobility    Modified Rankin (Stroke Patients Only)       Balance Overall balance assessment: Needs assistance Sitting-balance support: Feet unsupported;Single extremity supported Sitting balance-Leahy Scale: Good Sitting balance - Comments: able to balance on BSC independently   Standing balance support: Single extremity supported;During functional activity Standing balance-Leahy Scale: Fair Standing balance comment: minA to minGuard from husband for stand pivot transfer.                              Pertinent Vitals/Pain Pain Assessment: No/denies pain    Home Living Family/patient expects to be discharged to:: Private residence Living Arrangements: Spouse/significant other Available Help at Discharge: Family;Available 24 hours/day  Type of Home: House Home Access: Stairs to enter;Ramped entrance   Entrance Stairs-Number of Steps: 7 Home Layout: One level Home Equipment: Walker - 4 wheels;Grab bars - tub/shower;Grab bars - toilet;Hand held shower head;Tub bench;Wheelchair - manual      Prior Function Level of Independence:  Needs assistance   Gait / Transfers Assistance Needed: using SPC up until 3WA, then needed her rollator d/t decline (likely occult GIB)   ADL's / Homemaking Assistance Needed: Modified Independent ADL, husband assisted with some IADL;   Comments: pt wass quite funcitonal up until 1 month ago, still accessing the community regularly, going out to play cards with friends in [place] Hand: Left(since her CVA 10YA)    Extremity/Trunk Assessment   Upper Extremity Assessment Upper Extremity Assessment: Generalized weakness;Overall [facility] for tasks assessed    Lower Extremity Assessment Lower Extremity Assessment: Generalized weakness;Overall [facility] for tasks assessed       Communication   Communication: HOH;Expressive difficulties  Cognition Arousal/Alertness: Awake/alert Behavior During Therapy: WFL for tasks assessed/performed Overall Cognitive Status: Within Functional Limits for tasks assessed                                        General Comments      Exercises     Assessment/Plan    PT Assessment Patient needs continued PT services  PT Problem List Decreased strength;Decreased activity tolerance;Decreased balance;Decreased mobility;Decreased coordination;Decreased knowledge of precautions       PT Treatment Interventions Balance training;Functional mobility training;Therapeutic activities;Therapeutic exercise;Patient/family education    PT Goals (Current goals can be found in the Care Plan section)  Acute Rehab PT Goals Patient Stated Goal: regain to prior level of function in community AMB  PT Goal Formulation: With family Time For Goal Achievement: 05/13/18 Potential to Achieve Goals: Fair    Frequency Min 3X/week   Barriers to discharge Inaccessible home environment      Co-evaluation               AM-PAC PT "6 Clicks" Mobility  Outcome Measure Help needed turning from your back to your side while in a flat  bed without using bedrails?: Total Help needed moving from lying on your back to sitting on the side of a flat bed without using bedrails?: Total Help needed moving to and from a bed to a chair (including a wheelchair)?: Total Help needed standing up from a chair using your arms (e.g., wheelchair or bedside chair)?: A Little Help needed to walk in hospital room?: A Lot Help needed climbing 3-5 steps with a railing? : A Lot 6 Click Score: 10    End of Session   Activity Tolerance: Patient tolerated treatment well;Treatment limited secondary to medical complications (Comment)(finishing blood transfusion ) Patient left: in bed;with nursing/sitter in room;with family/visitor present;with call bell/phone within reach Nurse Communication: Mobility status PT Visit Diagnosis: Unsteadiness on feet (R26.81);Difficulty in walking, not elsewhere classified (R26.2);Muscle weakness (generalized) (M62.81);Other abnormalities of gait and mobility (R26.89)    Time: 6578-4696 PT Time Calculation (min) (ACUTE ONLY): 19 min   Charges:   PT Evaluation $PT Eval Moderate Complexity: 1 Mod          4:19 PM, 04/29/18 Etta Grandchild, PT, DPT Physical Therapist - [place] [phone] (Office)   , C 04/29/2018, 4:15 PM

## 2018-04-29 NOTE — Progress Notes (Signed)
  Subjective:  Patient has no complaints.  She denies nausea vomiting or abdominal pain.  Her appetite is back.  She ate all of her breakfast and wants real food.  Objective: Blood pressure (!) 130/55, pulse 69, temperature 98.5 F (36.9 C), temperature source Oral, resp. rate 18, height 5\' 4"  (1.626 m), weight 65.3 kg, SpO2 100 %. Patient is alert and talkative today. Abdominal exam reveals normal bowel sounds.  On palpation is soft and nontender with organomegaly or masses.  Patient is finishing third unit of PRBCs.  Labs/studies Results:  Recent Labs    May 05, 2018 0856 05-May-2018 1915 04/28/18 0548 04/29/18 0559  WBC 13.3*  --  9.8 6.8  HGB 6.1* 8.6* 8.7* 7.8*  HCT 21.3* 28.9* 29.5* 26.4*  PLT 412*  --  250 262    BMET  Recent Labs    05-05-18 0656 04/28/18 0548 04/29/18 0559  NA 145 146* 144  K 4.4 4.2 3.6  CL 120* 123* 119*  CO2 19* 18* 19*  GLUCOSE 97 85 85  BUN 54* 39* 26*  CREATININE 2.01* 1.77* 1.55*  CALCIUM 7.6* 7.7* 7.7*    LFT  Recent Labs    May 05, 2018 0656 04/28/18 0548 04/29/18 0559  PROT 5.2* 5.3* 5.2*  ALBUMIN 2.1* 2.2* 2.2*  AST 15 19 18   ALT 10 9 12   ALKPHOS 45 44 41  BILITOT 0.7 0.9 0.5    PT/INR  Recent Labs    05-05-18 1915  LABPROT 16.1*  INR 1.31    H. pylori serologies pending.  Assessment:  #1.  3 unit upper GI bleed secondary to large duodenal ulcer with stigmata of bleeding treated with hemospray yesterday.  She appears to have stopped bleeding.  She is on pantoprazole infusion.  #2.  Anemia secondary to upper GI bleed.    #3.  Remote history of CVA.  Clopidogrel is on hold.  Recommendations:  Advance diet to heart healthy diet.

## 2018-04-30 DIAGNOSIS — K264 Chronic or unspecified duodenal ulcer with hemorrhage: Secondary | ICD-10-CM

## 2018-04-30 DIAGNOSIS — K922 Gastrointestinal hemorrhage, unspecified: Secondary | ICD-10-CM

## 2018-04-30 LAB — CBC WITH DIFFERENTIAL/PLATELET
Abs Immature Granulocytes: 0.04 10*3/uL (ref 0.00–0.07)
BASOS ABS: 0.1 10*3/uL (ref 0.0–0.1)
Basophils Relative: 1 %
Eosinophils Absolute: 0.4 10*3/uL (ref 0.0–0.5)
Eosinophils Relative: 6 %
HEMATOCRIT: 29.1 % — AB (ref 36.0–46.0)
HEMOGLOBIN: 9 g/dL — AB (ref 12.0–15.0)
Immature Granulocytes: 1 %
LYMPHS ABS: 1.4 10*3/uL (ref 0.7–4.0)
Lymphocytes Relative: 18 %
MCH: 26.4 pg (ref 26.0–34.0)
MCHC: 30.9 g/dL (ref 30.0–36.0)
MCV: 85.3 fL (ref 80.0–100.0)
MONO ABS: 0.6 10*3/uL (ref 0.1–1.0)
Monocytes Relative: 7 %
NEUTROS ABS: 5.5 10*3/uL (ref 1.7–7.7)
Neutrophils Relative %: 67 %
Platelets: 244 10*3/uL (ref 150–400)
RBC: 3.41 MIL/uL — AB (ref 3.87–5.11)
RDW: 15 % (ref 11.5–15.5)
WBC: 8 10*3/uL (ref 4.0–10.5)
nRBC: 0 % (ref 0.0–0.2)

## 2018-04-30 LAB — TYPE AND SCREEN
ABO/RH(D): O POS
Antibody Screen: NEGATIVE
UNIT DIVISION: 0
Unit division: 0
Unit division: 0

## 2018-04-30 LAB — COMPREHENSIVE METABOLIC PANEL
ALBUMIN: 2.1 g/dL — AB (ref 3.5–5.0)
ALT: 13 U/L (ref 0–44)
AST: 21 U/L (ref 15–41)
Alkaline Phosphatase: 45 U/L (ref 38–126)
Anion gap: 5 (ref 5–15)
BUN: 27 mg/dL — AB (ref 8–23)
CHLORIDE: 120 mmol/L — AB (ref 98–111)
CO2: 18 mmol/L — AB (ref 22–32)
Calcium: 7.7 mg/dL — ABNORMAL LOW (ref 8.9–10.3)
Creatinine, Ser: 1.33 mg/dL — ABNORMAL HIGH (ref 0.44–1.00)
GFR calc Af Amer: 42 mL/min — ABNORMAL LOW (ref >60)
GFR, EST NON AFRICAN AMERICAN: 36 mL/min — AB (ref >60)
GLUCOSE: 92 mg/dL (ref 70–99)
POTASSIUM: 3.6 mmol/L (ref 3.5–5.1)
Sodium: 143 mmol/L (ref 135–145)
Total Bilirubin: 0.8 mg/dL (ref 0.3–1.2)
Total Protein: 4.8 g/dL — ABNORMAL LOW (ref 6.5–8.1)

## 2018-04-30 LAB — BPAM RBC
BLOOD PRODUCT EXPIRATION DATE: 201912252359
Blood Product Expiration Date: 201912062359
Blood Product Expiration Date: 201912252359
ISSUE DATE / TIME: 201911231129
ISSUE DATE / TIME: 201911231530
ISSUE DATE / TIME: 201911251110
UNIT TYPE AND RH: 5100
Unit Type and Rh: 5100
Unit Type and Rh: 5100

## 2018-04-30 LAB — H. PYLORI ANTIBODY, IGG: H PYLORI IGG: 0.71 {index_val} (ref 0.00–0.79)

## 2018-04-30 MED ORDER — CLONIDINE HCL 0.1 MG PO TABS
0.1000 mg | ORAL_TABLET | Freq: Once | ORAL | Status: AC
  Administered 2018-04-30: 0.1 mg via ORAL
  Filled 2018-04-30: qty 1

## 2018-04-30 MED ORDER — CLOPIDOGREL BISULFATE 75 MG PO TABS: 75.0000 mg | ORAL_TABLET | Freq: Every day | ORAL | 2 refills | Status: DC

## 2018-04-30 MED ORDER — PANTOPRAZOLE SODIUM 40 MG PO TBEC: 40.0000 mg | DELAYED_RELEASE_TABLET | Freq: Two times a day (BID) | ORAL | 5 refills | Status: DC

## 2018-04-30 NOTE — Progress Notes (Signed)
  Subjective:  Patient has no complaints.  She denies nausea vomiting or abdominal pain.  Objective: Blood pressure (!) 181/95, pulse (!) 108, temperature 97.9 F (36.6 C), temperature source Oral, resp. rate 16, height 5\' 4"  (1.626 m), weight 65.3 kg, SpO2 100 %. Patient is alert and in no acute distress. She is smiling and greeted me as I walked in. She does not appear to be pale anymore. Cardiac exam with regular rhythm normal S1 and S2.  No murmur gallop noted. Auscultation lungs reveal vesicular breath sounds bilaterally. Abdomen is soft and nontender with organomegaly or masses.  Labs/studies Results:  Recent Labs    May 06, 2018 0548 04/29/18 0559 04/29/18 1951 04/30/18 0515  WBC 9.8 6.8  --  8.0  HGB 8.7* 7.8* 9.3* 9.0*  HCT 29.5* 26.4* 30.4* 29.1*  PLT 250 262  --  244    BMET  Recent Labs    May 06, 2018 0548 04/29/18 0559 04/30/18 0515  NA 146* 144 143  K 4.2 3.6 3.6  CL 123* 119* 120*  CO2 18* 19* 18*  GLUCOSE 85 85 92  BUN 39* 26* 27*  CREATININE 1.77* 1.55* 1.33*  CALCIUM 7.7* 7.7* 7.7*    LFT  Recent Labs    May 06, 2018 0548 04/29/18 0559 04/30/18 0515  PROT 5.3* 5.2* 4.8*  ALBUMIN 2.2* 2.2* 2.1*  AST 19 18 21   ALT 9 12 13   ALKPHOS 44 41 45  BILITOT 0.9 0.5 0.8    PT/INR  Recent Labs    04/27/18 1915  LABPROT 16.1*  INR 1.31    H. pylori serology is pending.  Assessment:  #1.  3 unit upper GI bleed secondary to large bulbar ulcer.  Status post therapeutic EGD 2 days ago.  Bleeding controlled with application of Hemospray.  Patient had been taking 2 Aleve tablets every day and she also had been on clopidogrel.  No evidence of active bleeding.    #2.  Anemia secondary to upper GI bleed.  H&H is low but stable.   #3.  Prominent Schatzki's ring.  This ring was not manipulated on emergency endoscopy.  We will arrange for elective EGD in 10 weeks.  #4.  Malnutrition secondary to diminished oral intake over the last 3 months.  Serum albumin 2.1.   Expect improvement now that oral intake is improved.  #5.  Acute kidney injury secondary to dehydration.  Significant improvement in renal function with hydration.  Recommendations:  Pantoprazole can be transitioned to oral route at a dose of 40 mg p.o. twice daily when patient is ready for discharge. Given high risk of rebleeding we need to hold clopidogrel for 2 weeks. Patient advised to refrain from using Aleve or similar medications I will contact patient's husband with results of H. pylori serology.  If it is positive will delay treatment for 2 weeks. Elective EGD with esophageal dilation in 10 weeks.

## 2018-04-30 NOTE — Progress Notes (Signed)
IV removed and discharge instructions reviewed.  Home health arranged. Husband to drive home

## 2018-04-30 NOTE — Progress Notes (Signed)
Subjective: She feels much better.  No complaints.  Objective: Vital signs in last 24 hours: Temp:  [97.9 F (36.6 C)-98.5 F (36.9 C)] 97.9 F (36.6 C) (11/26 6283) Pulse Rate:  [67-108] 108 (11/26 0613) Resp:  [16-18] 16 (11/25 1435) BP: (130-181)/(55-95) 181/95 (11/26 0613) SpO2:  [95 %-100 %] 100 % (11/26 1517) Weight change:     Intake/Output from previous day: 11/25 0701 - 11/26 0700 In: 1770.9 [P.O.:600; I.V.:855.9; Blood:315] Out: -   PHYSICAL EXAM General appearance: alert, cooperative and no distress Resp: clear to auscultation bilaterally Cardio: regular rate and rhythm, S1, S2 normal, no murmur, click, rub or gallop GI: soft, non-tender; bowel sounds normal; no masses,  no organomegaly Extremities: extremities normal, atraumatic, no cyanosis or edema  Lab Results:  Results for orders placed or performed during the hospital encounter of 04/26/18 (from the past 48 hour(s))  Comprehensive metabolic panel     Status: Abnormal   Collection Time: 04/29/18  5:59 AM  Result Value Ref Range   Sodium 144 135 - 145 mmol/L   Potassium 3.6 3.5 - 5.1 mmol/L   Chloride 119 (H) 98 - 111 mmol/L   CO2 19 (L) 22 - 32 mmol/L   Glucose, Bld 85 70 - 99 mg/dL   BUN 26 (H) 8 - 23 mg/dL   Creatinine, Ser 1.55 (H) 0.44 - 1.00 mg/dL   Calcium 7.7 (L) 8.9 - 10.3 mg/dL   Total Protein 5.2 (L) 6.5 - 8.1 g/dL   Albumin 2.2 (L) 3.5 - 5.0 g/dL   AST 18 15 - 41 U/L   ALT 12 0 - 44 U/L   Alkaline Phosphatase 41 38 - 126 U/L   Total Bilirubin 0.5 0.3 - 1.2 mg/dL   GFR calc non Af Amer 30 (L) >60 mL/min   GFR calc Af Amer 35 (L) >60 mL/min    Comment: (NOTE) The eGFR has been calculated using the CKD EPI equation. This calculation has not been validated in all clinical situations. eGFR's persistently <60 mL/min signify possible Chronic Kidney Disease.    Anion gap 6 5 - 15    Comment: Performed at [facility], [address] [place], [place] [address]  Magnesium     Status: None   Collection Time: 04/29/18  5:59 AM  Result Value Ref Range   Magnesium 1.8 1.7 - 2.4 mg/dL    Comment: Performed at [facility], [address] [place], [place] [address]  CBC WITH DIFFERENTIAL     Status: Abnormal   Collection Time: 04/29/18  5:59 AM  Result Value Ref Range   WBC 6.8 4.0 - 10.5 K/uL   RBC 3.06 (L) 3.87 - 5.11 MIL/uL   Hemoglobin 7.8 (L) 12.0 - 15.0 g/dL   HCT 26.4 (L) 36.0 - 46.0 %   MCV 86.3 80.0 - 100.0 fL   MCH 25.5 (L) 26.0 - 34.0 pg   MCHC 29.5 (L) 30.0 - 36.0 g/dL   RDW 15.4 11.5 - 15.5 %   Platelets 262 150 - 400 K/uL   nRBC 0.0 0.0 - 0.2 %   Neutrophils Relative % 66 %   Neutro Abs 4.5 1.7 - 7.7 K/uL   Lymphocytes Relative 18 %   Lymphs Abs 1.2 0.7 - 4.0 K/uL   Monocytes Relative 7 %   Monocytes Absolute 0.5 0.1 - 1.0 K/uL   Eosinophils Relative 7 %   Eosinophils Absolute 0.4 0.0 - 0.5 K/uL   Basophils Relative 1 %   Basophils Absolute 0.1  0.0 - 0.1 K/uL   Immature Granulocytes 1 %   Abs Immature Granulocytes 0.05 0.00 - 0.07 K/uL    Comment: Performed at [facility], [address] [place], [place] [address]  Hemoglobin and hematocrit, blood     Status: Abnormal   Collection Time: 04/29/18  7:51 PM  Result Value Ref Range   Hemoglobin 9.3 (L) 12.0 - 15.0 g/dL   HCT 30.4 (L) 36.0 - 46.0 %    Comment: Performed at [facility], [address] [place], [place] [address]  Comprehensive metabolic panel     Status: Abnormal   Collection Time: 04/30/18  5:15 AM  Result Value Ref Range   Sodium 143 135 - 145 mmol/L   Potassium 3.6 3.5 - 5.1 mmol/L   Chloride 120 (H) 98 - 111 mmol/L   CO2 18 (L) 22 - 32 mmol/L   Glucose, Bld 92 70 - 99 mg/dL   BUN 27 (H) 8 - 23 mg/dL   Creatinine, Ser 1.33 (H) 0.44 - 1.00 mg/dL   Calcium 7.7 (L) 8.9 - 10.3 mg/dL   Total Protein 4.8 (L) 6.5 - 8.1 g/dL   Albumin 2.1 (L) 3.5 - 5.0 g/dL   AST 21 15 - 41 U/L   ALT 13 0 - 44 U/L   Alkaline Phosphatase 45 38 - 126 U/L   Total Bilirubin 0.8 0.3 - 1.2 mg/dL   GFR calc  non Af Amer 36 (L) >60 mL/min   GFR calc Af Amer 42 (L) >60 mL/min    Comment: (NOTE) The eGFR has been calculated using the CKD EPI equation. This calculation has not been validated in all clinical situations. eGFR's persistently <60 mL/min signify possible Chronic Kidney Disease.    Anion gap 5 5 - 15    Comment: Performed at [facility], [address] [place], [place] [address]  CBC WITH DIFFERENTIAL     Status: Abnormal   Collection Time: 04/30/18  5:15 AM  Result Value Ref Range   WBC 8.0 4.0 - 10.5 K/uL   RBC 3.41 (L) 3.87 - 5.11 MIL/uL   Hemoglobin 9.0 (L) 12.0 - 15.0 g/dL   HCT 29.1 (L) 36.0 - 46.0 %   MCV 85.3 80.0 - 100.0 fL   MCH 26.4 26.0 - 34.0 pg   MCHC 30.9 30.0 - 36.0 g/dL   RDW 15.0 11.5 - 15.5 %   Platelets 244 150 - 400 K/uL   nRBC 0.0 0.0 - 0.2 %   Neutrophils Relative % 67 %   Neutro Abs 5.5 1.7 - 7.7 K/uL   Lymphocytes Relative 18 %   Lymphs Abs 1.4 0.7 - 4.0 K/uL   Monocytes Relative 7 %   Monocytes Absolute 0.6 0.1 - 1.0 K/uL   Eosinophils Relative 6 %   Eosinophils Absolute 0.4 0.0 - 0.5 K/uL   Basophils Relative 1 %   Basophils Absolute 0.1 0.0 - 0.1 K/uL   Immature Granulocytes 1 %   Abs Immature Granulocytes 0.04 0.00 - 0.07 K/uL    Comment: Performed at [facility], [address] [place], [place] [address]    ABGS No results for input(s): PHART, PO2ART, TCO2, HCO3 in the last 72 hours.  Invalid input(s): PCO2 CULTURES Recent Results (from the past 240 hour(s))  Urine culture     Status: None   Collection Time: 04/26/18  8:10 AM  Result Value Ref Range Status   Specimen Description   Final    URINE, CATHETERIZED Performed at [facility], [address], [place], [place] [address]    Special Requests   Final    NONE Performed at [facility], [address], [place], [place] [address]    Culture   Final    NO GROWTH Performed at [facility], [place] [address], [place],  [address]    Report Status 04/27/2018 FINAL   Final   Studies/Results: No results found.  Medications:  Prior to Admission:  Medications Prior to Admission  Medication Sig Dispense Refill Last Dose  . latanoprost (XALATAN) 0.005 % ophthalmic solution Place 1 drop into both eyes at bedtime.   04/25/2018 at 2300  . lisinopril-hydrochlorothiazide (PRINZIDE,ZESTORETIC) 20-12.5 MG per tablet Take 1 tablet by mouth daily.     04/25/2018 at 0700  . Multiple Vitamins-Minerals (CENTRUM ULTRA WOMENS) TABS Take 1 tablet by mouth daily.     04/25/2018 at 0700  . rosuvastatin (CRESTOR) 20 MG tablet Take 20 mg by mouth daily.   04/25/2018 at 2300  . senna (SENOKOT) 8.6 MG tablet Take 2 tablets by mouth at bedtime.    04/25/2018 at 2300  . [DISCONTINUED] clopidogrel (PLAVIX) 75 MG tablet Take 1 tablet (75 mg total) by mouth daily. 30 tablet 2 04/25/2018 at 0700   Scheduled: . latanoprost  1 drop Both Eyes QHS  . multivitamin with minerals   Oral Daily  . [START ON 05/01/2018] pantoprazole  40 mg Intravenous Q12H  . rosuvastatin  10 mg Oral q1800  . senna  2 tablet Oral QHS   Continuous: . sodium chloride 75 mL/hr at 04/30/18 0303  . pantoprozole (PROTONIX) infusion 8 mg/hr (04/30/18 0303)  . sodium chloride     VDI:[id number] **OR** acetaminophen, ondansetron **OR** ondansetron (ZOFRAN) IV, traZODone  Assesment: She was admitted with acute renal failure and that seems to be related to dehydration and GI bleeding.  She has a duodenal ulcer.  She is much better.  She is eating.  Her hemoglobin is stabilized.  She is ready for discharge Principal Problem:   Acute renal failure (ARF) ([place]) Active Problems:   History of CVA (cerebrovascular accident)   Hypertension   UTI (urinary tract infection)   Acute blood loss anemia   Dehydration   Anemia, unspecified   Altered mental state   Leukocytosis   Cerebrovascular disease   Hypokalemia   Duodenal ulcer    Plan: Discharge home today.  She will have home health services.  Start Plavix  in 2 weeks.    LOS: 4 days   [name] L 04/30/2018, 8:58 AM

## 2018-04-30 NOTE — Discharge Summary (Signed)
Physician Discharge Summary  Patient ID: [name] MRN: [id number] DOB/AGE: 1934-07-13 83 y.o. Primary Care Physician:Sanford Lindblad, Percell Miller, MD Admit date: 04/26/2018 Discharge date: 04/30/2018    Discharge Diagnoses:   Principal Problem:   Acute renal failure (ARF) ([place]) Active Problems:   History of CVA (cerebrovascular accident)   Hypertension   UTI (urinary tract infection)   Acute blood loss anemia   Dehydration   Anemia, unspecified   Altered mental state   Leukocytosis   Cerebrovascular disease   Hypokalemia   Duodenal ulcer   Allergies as of 04/30/2018   No Known Allergies     Medication List    TAKE these medications   CENTRUM ULTRA WOMENS Tabs Take 1 tablet by mouth daily.   clopidogrel 75 MG tablet Commonly known as:  PLAVIX Take 1 tablet (75 mg total) by mouth daily. Start in 2 weeks What changed:  additional instructions   latanoprost 0.005 % ophthalmic solution Commonly known as:  XALATAN Place 1 drop into both eyes at bedtime.   lisinopril-hydrochlorothiazide 20-12.5 MG tablet Commonly known as:  PRINZIDE,ZESTORETIC Take 1 tablet by mouth daily.   pantoprazole 40 MG tablet Commonly known as:  PROTONIX Take 1 tablet (40 mg total) by mouth 2 (two) times daily.   rosuvastatin 20 MG tablet Commonly known as:  CRESTOR Take 20 mg by mouth daily.   senna 8.6 MG tablet Commonly known as:  SENOKOT Take 2 tablets by mouth at bedtime.       Discharged Condition: Improved    Consults: Gastroenterology  Significant Diagnostic Studies: Dg Chest 2 View  Result Date: 04/17/2018 CLINICAL DATA:  Failure to thrive and depression for 2 days EXAM: CHEST - 2 VIEW COMPARISON:  06/12/2014 FINDINGS: Chronic cardiomegaly. Mild aortic tortuosity. There is no edema, consolidation, effusion, or pneumothorax. 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 Extent of  chondroid matrix measures nearly 9 cm in length. Chronic lower thoracic compression fracture, likely T12, with advanced height loss. IMPRESSION: No acute finding when compared to priors. Chronic cardiomegaly. Electronically Signed   By: Monte Fantasia M.D.   On: 04/17/2018 13:27   Ct Head Wo Contrast  Result Date: 04/26/2018 CLINICAL DATA:  Altered level of consciousness EXAM: CT HEAD WITHOUT CONTRAST TECHNIQUE: Contiguous axial images were obtained from the base of the skull through the vertex without intravenous contrast. COMPARISON:  May 13, 2011 FINDINGS: Brain: Ventricles appear normal in size and configuration for age and are stable. There is extensive frontal and parietal lobe atrophy, somewhat more severe on the left than on the right. 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 No change in the appearance of the extra-axial fluid is apparent on this study. There is no intracranial mass. There is no hemorrhage in the intra-axial or extra-axial regions. No midline shift. There is encephalomalacia in portions of the left temporal lobe with prior infarcts in the left posterior lentiform nucleus with involvement of portions of the left internal and external capsules posteriorly, stable. There is evidence of a prior infarct at the left posterior temporal-anterior occipital junction. No acute infarct is evident. Vascular: No hyperdense vessel. There is calcification in each carotid siphon region. Skull: The bony calvarium appears intact. Sinuses/Orbits: There is mucosal thickening in several ethmoid air cells. Orbits appear symmetric bilaterally. Other: Mastoids are essentially aplastic bilaterally. IMPRESSION: 1. Extensive  frontal and parietal lobe atrophy, more severe on the left than on the right, stable from 2012. 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 No  acute hemorrhage. No mass or midline shift. 2. Encephalomalacia left temporal lobe with left basal ganglia infarct posteriorly, stable. Prior left temporal occipital junction infarct. No acute infarct evident. 3.  Foci of arterial vascular calcification noted. 4.  Mucosal thickening in several ethmoid air cells. 5.  Aplastic mastoids bilaterally. Electronically Signed   By: Lowella Grip III M.D.   On: 04/26/2018 09:15   US Abdomen Limited  Result Date: 04/17/2018 CLINICAL DATA:  Right upper quadrant with nausea vomiting EXAM: ULTRASOUND ABDOMEN LIMITED RIGHT UPPER QUADRANT COMPARISON:  None. FINDINGS: Gallbladder: No gallstones or wall thickening visualized. No sonographic Murphy sign noted by sonographer. Gallbladder is distended however this could be within normal limits. Common bile duct: Diameter: 3 mm Liver: 1 cm right hepatic cyst. No liver mass. Portal vein is patent on color Doppler imaging with normal direction of blood flow towards the liver. IMPRESSION: Distended gallbladder without gallstones or gallbladder thickening. Negative for biliary dilatation. Electronically Signed   By: Franchot Gallo M.D.   On: 04/17/2018 15:43   Portable Chest 1 View  Result Date: 04/27/2018 CLINICAL DATA:  Follow-up exam.  Altered mental status EXAM: PORTABLE CHEST 1 VIEW COMPARISON:  Chest radiograph 04/26/2018 FINDINGS: Stable cardiomegaly. Aortic atherosclerosis. Low lung volumes. Bibasilar atelectasis. No pleural effusion or pneumothorax. Chondroid lesion proximal left humerus. IMPRESSION: No acute cardiopulmonary process. Low lung volumes with basilar atelectasis. Electronically Signed   By: Lovey Newcomer M.D.   On: 04/27/2018 08:59   Dg Chest Port 1 View  Result Date: 04/26/2018 CLINICAL DATA:  Altered mental status EXAM: PORTABLE CHEST 1 VIEW COMPARISON:  04/17/2018 FINDINGS: Cardiomegaly. Low lung volumes. No confluent opacities or effusions. Chondroid lesion again noted in the proximal left humerus,  unchanged. No acute bony abnormality. IMPRESSION: Cardiomegaly.  Low lung volumes.  No active disease. Electronically Signed   By: Rolm Baptise M.D.   On: 04/26/2018 08:55    Lab Results: Basic Metabolic Panel: Recent Labs    04/28/18 0548 04/29/18 0559 04/30/18 0515  NA 146* 144 143  K 4.2 3.6 3.6  CL 123* 119* 120*  CO2 18* 19* 18*  GLUCOSE 85 85 92  BUN 39* 26* 27*  CREATININE 1.77* 1.55* 1.33*  CALCIUM 7.7* 7.7* 7.7*  MG 2.0 1.8  --    Liver Function Tests: Recent Labs    04/29/18 0559 04/30/18 0515  AST 18 21  ALT 12 13  ALKPHOS 41 45  BILITOT 0.5 0.8  PROT 5.2* 4.8*  ALBUMIN 2.2* 2.1*     CBC: Recent Labs    04/29/18 0559 04/29/18 1951 04/30/18 0515  WBC 6.8  --  8.0  NEUTROABS 4.5  --  5.5  HGB 7.8* 9.3* 9.0*  HCT 26.4* 30.4* 29.1*  MCV 86.3  --  85.3  PLT 262  --  244    Recent Results (from the past 240 hour(s))  Urine culture     Status: None   Collection Time: 04/26/18  8:10 AM  Result Value Ref Range Status   Specimen Description   Final    URINE, CATHETERIZED Performed at [facility], [address] [place], [place] [address]    Special Requests   Final    NONE Performed at [facility], [address], [place], [place] [address]    Culture  Final    NO GROWTH Performed at [facility], [place] [address], [place], [place] [address]    Report Status 04/27/2018 FINAL  Final     Hospital Course: This is an 83 year old who had been in her usual state of poor health at home when she was found unresponsive.  She was brought to the emergency department and was found to have elevated BUN and creatinine with acute renal failure.  It was later discovered that she had GI bleeding.  She required 3 units of packed red blood cells.  She underwent an endoscopy and was found to have duodenal ulcer.  This was treated and she showed marked improvement over the next several days.  She had been on Plavix and that will be held for 2 weeks.  Discharge  Exam: Blood pressure (!) 181/95, pulse (!) 108, temperature 97.9 F (36.6 C), temperature source Oral, resp. rate 16, height 5\' 4"  (1.626 m), weight 65.3 kg, SpO2 100 %. She is awake and alert.  Chest is clear.  Abdomen is soft  Disposition: Home with home health services for RN and PT.  She will start Plavix in 2 weeks.  She will see me in 2 weeks.  If she has Helicobacter Dr. Laural Golden will manage that  Discharge Instructions    Face-to-face encounter (required for Medicare/Medicaid patients)   Complete by:  As directed    I Jakiyah Stepney L certify that this patient is under my care and that I, or a nurse practitioner or physician's assistant working with me, had a face-to-face encounter that meets the physician face-to-face encounter requirements with this patient on 04/30/2018. The encounter with the patient was in whole, or in part for the following medical condition(s) which is the primary reason for home health care (List medical condition): GI bleeding   The encounter with the patient was in whole, or in part, for the following medical condition, which is the primary reason for home health care:  GI bleeding   I certify that, based on my findings, the following services are medically necessary home health services:   Nursing Physical therapy     Reason for Medically Necessary Home Health Services:  Skilled Nursing- Change/Decline in Patient Status   My clinical findings support the need for the above services:  Unable to leave home safely without assistance and/or assistive device   Further, I certify that my clinical findings support that this patient is homebound due to:  Unable to leave home safely without assistance   Home Health   Complete by:  As directed    To provide the following care/treatments:   PT RN          Signed: Meron Bocchino L   04/30/2018, 9:00 AM

## 2018-04-30 NOTE — Care Management Note (Signed)
Case Management Note  Patient Details  Name: ADELINE PETITFRERE MRN: [id number] Date of Birth: 1934-09-30  Subjective/Objective:      Admitted with GIB from large ulcer and shatzki's ring. Pt has insurance and PCP. Has hx of CVA with aphasia and hip replacement. Pt lives with husband who is with her 24/7, pt very active at baseline, very ind. Has all necessary DME from past hx.has had [place] int he past with [facility] and would like to use them again. DC planning discussed with husband at bedside.           Action/Plan: DC home today with referral for [facility] services. Pt aware HH has 48 hrs to make first visit. Vaughan Basta, [facility] rep, aware of referral and will pull pt info from chart. [facility] unable to provide nursing. Family not interested in nursing services. [facility] to further discuss with husband.   Expected Discharge Date:  04/30/18               Expected Discharge Plan:  [place]  In-House Referral:  NA  Discharge planning Services  CM Consult  Post Acute Care Choice:  Home Health Choice offered to:  Spouse  HH Arranged:  RN, PT [facility] Agency:  [place]  Status of Service:  Completed, signed off  Sherald Barge, RN 04/30/2018, 9:06 AM

## 2018-05-01 ENCOUNTER — Encounter (HOSPITAL_COMMUNITY): Payer: Self-pay | Admitting: Internal Medicine

## 2018-05-03 DIAGNOSIS — I69351 Hemiplegia and hemiparesis following cerebral infarction affecting right dominant side: Secondary | ICD-10-CM | POA: Diagnosis not present

## 2018-05-03 DIAGNOSIS — Z87440 Personal history of urinary (tract) infections: Secondary | ICD-10-CM | POA: Diagnosis not present

## 2018-05-03 DIAGNOSIS — Z9181 History of falling: Secondary | ICD-10-CM | POA: Diagnosis not present

## 2018-05-03 DIAGNOSIS — Z87891 Personal history of nicotine dependence: Secondary | ICD-10-CM | POA: Diagnosis not present

## 2018-05-03 DIAGNOSIS — H9190 Unspecified hearing loss, unspecified ear: Secondary | ICD-10-CM | POA: Diagnosis not present

## 2018-05-03 DIAGNOSIS — I10 Essential (primary) hypertension: Secondary | ICD-10-CM | POA: Diagnosis not present

## 2018-05-03 DIAGNOSIS — M1711 Unilateral primary osteoarthritis, right knee: Secondary | ICD-10-CM | POA: Diagnosis not present

## 2018-05-03 DIAGNOSIS — D649 Anemia, unspecified: Secondary | ICD-10-CM | POA: Diagnosis not present

## 2018-05-03 DIAGNOSIS — I6529 Occlusion and stenosis of unspecified carotid artery: Secondary | ICD-10-CM | POA: Diagnosis not present

## 2018-05-03 DIAGNOSIS — I69390 Apraxia following cerebral infarction: Secondary | ICD-10-CM | POA: Diagnosis not present

## 2018-05-03 DIAGNOSIS — K269 Duodenal ulcer, unspecified as acute or chronic, without hemorrhage or perforation: Secondary | ICD-10-CM | POA: Diagnosis not present

## 2018-05-03 DIAGNOSIS — I69320 Aphasia following cerebral infarction: Secondary | ICD-10-CM | POA: Diagnosis not present

## 2018-05-03 DIAGNOSIS — E7800 Pure hypercholesterolemia, unspecified: Secondary | ICD-10-CM | POA: Diagnosis not present

## 2018-05-08 ENCOUNTER — Other Ambulatory Visit (INDEPENDENT_AMBULATORY_CARE_PROVIDER_SITE_OTHER): Payer: Self-pay | Admitting: *Deleted

## 2018-05-08 DIAGNOSIS — K279 Peptic ulcer, site unspecified, unspecified as acute or chronic, without hemorrhage or perforation: Secondary | ICD-10-CM | POA: Insufficient documentation

## 2018-05-08 DIAGNOSIS — K222 Esophageal obstruction: Secondary | ICD-10-CM | POA: Insufficient documentation

## 2018-05-13 DIAGNOSIS — D62 Acute posthemorrhagic anemia: Secondary | ICD-10-CM | POA: Diagnosis not present

## 2018-05-13 DIAGNOSIS — I69328 Other speech and language deficits following cerebral infarction: Secondary | ICD-10-CM | POA: Diagnosis not present

## 2018-05-13 DIAGNOSIS — I69320 Aphasia following cerebral infarction: Secondary | ICD-10-CM | POA: Diagnosis not present

## 2018-05-13 DIAGNOSIS — I10 Essential (primary) hypertension: Secondary | ICD-10-CM | POA: Diagnosis not present

## 2018-05-13 DIAGNOSIS — E785 Hyperlipidemia, unspecified: Secondary | ICD-10-CM | POA: Diagnosis not present

## 2018-05-14 LAB — CBC WITH DIFF/PLATELET
BASO(ABSOLUTE): 117
Basophils: 1.7
Eosinophils Absolute: 242
Eosinophils, %: 3.5
HCT: 32 (ref 29–41)
Hemoglobin: 10.1
Lymphocytes: 18.6
Lymphs Abs: 1283
MCH: 25.9
MCHC: 31.2
MCV: 83.1 (ref 76–111)
MPV: 10.4 fL (ref 7.5–11.5)
Monocytes(Absolute): 587
Monocytes: 8.5
Neutro Abs: 4671
Neutrophils: 67.7
RBC: 3.9 (ref 3.87–5.11)
RDW: 15.2
WBC: 6.9
platelet count: 499

## 2018-05-14 LAB — IRON AND TIBC CHCC
Ferritin: 28
Iron, Total/Total Iron Binding CAP: 315
Iron, Total/Total Iron Binding CAP: 48
Saturation Ratios: 15

## 2018-05-16 ENCOUNTER — Encounter (INDEPENDENT_AMBULATORY_CARE_PROVIDER_SITE_OTHER): Payer: Self-pay | Admitting: *Deleted

## 2018-06-07 ENCOUNTER — Encounter (INDEPENDENT_AMBULATORY_CARE_PROVIDER_SITE_OTHER): Payer: Self-pay | Admitting: *Deleted

## 2018-06-10 ENCOUNTER — Telehealth (INDEPENDENT_AMBULATORY_CARE_PROVIDER_SITE_OTHER): Payer: Self-pay | Admitting: *Deleted

## 2018-06-10 NOTE — Telephone Encounter (Signed)
Per [name] with Dr Luan Pulling -- it is ok for patient to stop Plavix 5 days piror to endoscopy sch'd 07/11/18

## 2018-07-11 ENCOUNTER — Ambulatory Visit (HOSPITAL_COMMUNITY)
Admission: RE | Admit: 2018-07-11 | Discharge: 2018-07-11 | Disposition: A | Discharge status: Home / Self Care | Payer: PPO | Attending: Internal Medicine | Admitting: Internal Medicine

## 2018-07-11 ENCOUNTER — Encounter (HOSPITAL_COMMUNITY): Payer: Self-pay | Admitting: *Deleted

## 2018-07-11 ENCOUNTER — Encounter (HOSPITAL_COMMUNITY)
Admission: RE | Disposition: A | Discharge status: Home / Self Care | Payer: Self-pay | Source: Home / Self Care | Attending: Internal Medicine

## 2018-07-11 DIAGNOSIS — Z87891 Personal history of nicotine dependence: Secondary | ICD-10-CM | POA: Insufficient documentation

## 2018-07-11 DIAGNOSIS — E7800 Pure hypercholesterolemia, unspecified: Secondary | ICD-10-CM | POA: Insufficient documentation

## 2018-07-11 DIAGNOSIS — Z7902 Long term (current) use of antithrombotics/antiplatelets: Secondary | ICD-10-CM | POA: Insufficient documentation

## 2018-07-11 DIAGNOSIS — K449 Diaphragmatic hernia without obstruction or gangrene: Secondary | ICD-10-CM | POA: Insufficient documentation

## 2018-07-11 DIAGNOSIS — R1314 Dysphagia, pharyngoesophageal phase: Secondary | ICD-10-CM | POA: Insufficient documentation

## 2018-07-11 DIAGNOSIS — I69328 Other speech and language deficits following cerebral infarction: Secondary | ICD-10-CM | POA: Insufficient documentation

## 2018-07-11 DIAGNOSIS — Z8711 Personal history of peptic ulcer disease: Secondary | ICD-10-CM | POA: Insufficient documentation

## 2018-07-11 DIAGNOSIS — K222 Esophageal obstruction: Secondary | ICD-10-CM | POA: Diagnosis not present

## 2018-07-11 DIAGNOSIS — I10 Essential (primary) hypertension: Secondary | ICD-10-CM | POA: Insufficient documentation

## 2018-07-11 DIAGNOSIS — Z79899 Other long term (current) drug therapy: Secondary | ICD-10-CM | POA: Diagnosis not present

## 2018-07-11 DIAGNOSIS — K279 Peptic ulcer, site unspecified, unspecified as acute or chronic, without hemorrhage or perforation: Secondary | ICD-10-CM | POA: Insufficient documentation

## 2018-07-11 DIAGNOSIS — I69351 Hemiplegia and hemiparesis following cerebral infarction affecting right dominant side: Secondary | ICD-10-CM | POA: Insufficient documentation

## 2018-07-11 HISTORY — PX: ESOPHAGOGASTRODUODENOSCOPY: SHX5428

## 2018-07-11 HISTORY — PX: ESOPHAGEAL DILATION: SHX303

## 2018-07-11 SURGERY — EGD (ESOPHAGOGASTRODUODENOSCOPY)
Anesthesia: Moderate Sedation

## 2018-07-11 MED ORDER — MEPERIDINE HCL 50 MG/ML IJ SOLN
INTRAMUSCULAR | Status: AC
  Filled 2018-07-11: qty 1

## 2018-07-11 MED ORDER — LIDOCAINE VISCOUS HCL 2 % MT SOLN
OROMUCOSAL | Status: DC | PRN
  Administered 2018-07-11: 4 mL via OROMUCOSAL

## 2018-07-11 MED ORDER — PANTOPRAZOLE SODIUM 40 MG PO TBEC: 40.0000 mg | DELAYED_RELEASE_TABLET | Freq: Every day | ORAL | 3 refills | Status: DC

## 2018-07-11 MED ORDER — MIDAZOLAM HCL 5 MG/5ML IJ SOLN
INTRAMUSCULAR | Status: DC | PRN
  Administered 2018-07-11 (×3): 1 mg via INTRAVENOUS

## 2018-07-11 MED ORDER — SODIUM CHLORIDE 0.9 % IV SOLN
INTRAVENOUS | Status: DC
  Administered 2018-07-11: 11:00:00 via INTRAVENOUS

## 2018-07-11 MED ORDER — MIDAZOLAM HCL 5 MG/5ML IJ SOLN
INTRAMUSCULAR | Status: AC
  Filled 2018-07-11: qty 10

## 2018-07-11 MED ORDER — LIDOCAINE VISCOUS HCL 2 % MT SOLN
OROMUCOSAL | Status: AC
  Filled 2018-07-11: qty 15

## 2018-07-11 MED ORDER — STERILE WATER FOR IRRIGATION IR SOLN
Status: DC | PRN
  Administered 2018-07-11: 12:00:00

## 2018-07-11 MED ORDER — MEPERIDINE HCL 50 MG/ML IJ SOLN
INTRAMUSCULAR | Status: DC | PRN
  Administered 2018-07-11: 20 mg via INTRAVENOUS

## 2018-07-11 NOTE — H&P (Addendum)
[name] is an 84 y.o. female.   Chief Complaint: Patient is here for EGD and ED. HPI: Patient is 84 year old Caucasian female with multiple medical problems including history of CVA who was hospitalized in November 2019 with dehydration and GI bleed.  She was found to have a large duodenal ulcer.  She was also experiencing solid food dysphagia.  She was found to have Schatzki's ring but it was not dilated because of GI bleed.  Her husband states she has had one episode of significant dysphagia.  She has been eating slowly and not having any problems.  She has not had any more episodes of abdominal pain or melena.  Last hemoglobin was more than 10 g.  She does not NSAIDs anymore Last Plavix dose was on 07/06/2018.  Past Medical History:  Diagnosis Date  . 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 Constipation   . Degenerative joint disease of knee, right    right knee  . Hearing impaired person    wears hearing aids  . High cholesterol   . History of blood transfusion   . Hx: UTI (urinary tract infection)   . Hypertension   . Kidney stones   . Stroke [facility])    speech is effected slightly , right sided weakness  (has complete obstruction left carotid - per husband)    Past Surgical History:  Procedure Laterality Date  . APPENDECTOMY    . ESOPHAGOGASTRODUODENOSCOPY N/A 04/28/2018   Procedure: ESOPHAGOGASTRODUODENOSCOPY (EGD);  Surgeon: Rogene Houston, MD;  [facility];  Service: Endoscopy;  Laterality: N/A;  . HARDWARE REMOVAL Right 10/05/2014   Procedure: HARDWARE REMOVAL;  Surgeon: Dorna Leitz, MD;  Location: [place];  Service: Orthopedics;  Laterality: Right;  . INTRAMEDULLARY (IM) NAIL INTERTROCHANTERIC Right 06/12/2014   Procedure: INTRAMEDULLARY (IM) NAIL INTERTROCHANTRIC RIGHT HIP;  Surgeon: Alta Corning, MD;  Location: [place];  Service: Orthopedics;  Laterality: Right;  . kidney stones    . LITHOTRIPSY    . PEG PLACEMENT     and removal  . PEG TUBE REMOVAL     . RADIOLOGY WITH ANESTHESIA  04/22/2012   Procedure: RADIOLOGY WITH ANESTHESIA;  Surgeon: Rob Hickman, MD;  Location: [place];  Service: Radiology;  Laterality: N/A;  VERTEBRAL ARTERY STENT PLACEMENT AND CEREBRAL ANGIOGRAM  . TONSILLECTOMY    . TYMPANOPLASTY      Family History  Problem Relation Age of Onset  . CVA Father    Social History:  reports that she quit smoking about 34 years ago. She has never used smokeless tobacco. She reports current alcohol use. She reports that she does not use drugs.  Allergies: No Known Allergies  Medications Prior to Admission  Medication Sig Dispense Refill  . clopidogrel (PLAVIX) 75 MG tablet Take 1 tablet (75 mg total) by mouth daily. Start in 2 weeks 30 tablet 2  . latanoprost (XALATAN) 0.005 % ophthalmic solution Place 1 drop into both eyes at bedtime.    Marland Kitchen lisinopril-hydrochlorothiazide (PRINZIDE,ZESTORETIC) 20-12.5 MG per tablet Take 1 tablet by mouth daily.      . Multiple Vitamins-Minerals (CENTRUM ULTRA WOMENS) TABS Take 1 tablet by mouth daily.      . pantoprazole (PROTONIX) 40 MG tablet Take 1 tablet (40 mg total) by mouth 2 (two) times daily. 60 tablet 5  . rosuvastatin (CRESTOR) 20 MG tablet Take 20 mg by mouth every evening.     . senna (SENOKOT) 8.6 MG tablet Take 1 tablet by mouth  at bedtime.       No results found for this or any previous visit (from the past 48 hour(s)). No results found.  ROS  Blood pressure (!) 143/55, pulse 73, temperature 97.8 F (36.6 C), temperature source Oral, resp. rate 14, height 5\' 4"  (1.626 m), weight 72.6 kg, SpO2 98 %. Physical Exam  Constitutional: She appears well-developed and well-nourished.  HENT:  Mouth/Throat: Oropharynx is clear and moist.  Eyes: Conjunctivae are normal. No scleral icterus.  Neck: No thyromegaly present.  Cardiovascular: Normal rate, regular rhythm and normal heart sounds.  No murmur heard. Respiratory: Effort normal and breath sounds normal.  GI: She  exhibits no distension and no mass. There is no abdominal tenderness.  Musculoskeletal:        General: No edema.  Lymphadenopathy:    She has no cervical adenopathy.  Neurological: She is alert.  Skin: Skin is warm and dry.     Assessment/Plan History of dysphagia and known Schatzki's ring. History of large duodenal ulcer. EGD with esophageal dilation and also document healing of duodenal ulcer.  Hildred Laser, MD 07/11/2018, 11:38 AM

## 2018-07-11 NOTE — Op Note (Signed)
[facility] Patient Name: [name] Procedure Date: 07/11/2018 11:16 AM MRN: [id number] Date of Birth: [birth year] Attending MD: Hildred Laser , MD CSN: [id number] Age: 84 Admit Type: Outpatient Procedure:                Upper GI endoscopy Indications:              Therapeutic procedure, Esophageal dysphagia Providers:                Hildred Laser, MD, [place]is Peak B. Sharon Seller, RN, Gerome Sam, RN, Randa Spike, Technician Referring MD:             Jasper Loser. Luan Pulling, MD Medicines:                Lidocaine spray, Meperidine 20 mg IV, Midazolam 3                            mg IV Complications:            No immediate complications. Estimated Blood Loss:     Estimated blood loss was minimal. Procedure:                Pre-Anesthesia Assessment:                           - Prior to the procedure, a History and Physical                            was performed, and patient medications and                            allergies were reviewed. The patient's tolerance of                            previous anesthesia was also reviewed. The risks                            and benefits of the procedure and the sedation                            options and risks were discussed with the patient.                            All questions were answered, and informed consent                            was obtained. Prior Anticoagulants: The patient                            last took Plavix (clopidogrel) 5 days prior to the                            procedure. ASA Grade Assessment: III - A patient  with severe systemic disease. After reviewing the                            risks and benefits, the patient was deemed in                            satisfactory condition to undergo the procedure.                           After obtaining informed consent, the endoscope was                            passed under direct vision. Throughout  the                            procedure, the patient's blood pressure, pulse, and                            oxygen saturations were monitored continuously. The                            [id number]-H190 ([id number]) scope was introduced through the                            mouth, and advanced to the second part of duodenum.                            The upper GI endoscopy was accomplished without                            difficulty. The patient tolerated the procedure                            well. Scope In: 11:49:02 AM Scope Out: 11:59:05 AM Total Procedure Duration: 0 hours 10 minutes 3 seconds  Findings:      The examined esophagus was normal.      A moderate Schatzki ring was found at the gastroesophageal junction.       Ring dilated with balloon dilator to 18 mm. It was further disrupted       woith focal biopsy. The dilation site was examined and showed mild       mucosal disruption, moderate improvement in luminal narrowing and no       perforation.      A 4 cm hiatal hernia was present.      The entire examined stomach was normal.      The duodenal bulb and second portion of the duodenum were normal. Impression:               - Normal esophagus.                           - Moderate Schatzki ring. Dilated and disrupted                            with balloon and focal biopsy(no tissue saved.                           -  4 cm hiatal hernia.                           - Normal stomach.                           - Duodenal ulcer has healed.                           - No specimens collected. Moderate Sedation:      Moderate (conscious) sedation was administered by the endoscopy nurse       and supervised by the endoscopist. The following parameters were       monitored: oxygen saturation, heart rate, blood pressure, CO2       capnography and response to care. Total physician intraservice time was       15 minutes. Recommendation:           - Patient has a contact number available  for                            emergencies. The signs and symptoms of potential                            delayed complications were discussed with the                            patient. Return to normal activities tomorrow.                            Written discharge instructions were provided to the                            patient.                           - Resume previous diet today.                           - Resume Clopidogrel on 07/13/2018.                           - Reduce Pantoprazole to once daily but continue                            present medications.                           - Repeat upper endoscopy PRN. Procedure Code(s):        --- Professional ---                           [phone], Esophagogastroduodenoscopy, flexible,                            transoral; diagnostic, including collection of  specimen(s) by brushing or washing, when performed                            (separate procedure)                           G0500, Moderate sedation services provided by the                            same physician or other qualified health care                            professional performing a gastrointestinal                            endoscopic service that sedation supports,                            requiring the presence of an independent trained                            observer to assist in the monitoring of the                            patient's level of consciousness and physiological                            status; initial 15 minutes of intra-service time;                            patient age 34 years or older (additional time may                            be reported with [phone], as appropriate) Diagnosis Code(s):        --- Professional ---                           K22.2, Esophageal obstruction                           K44.9, Diaphragmatic hernia without obstruction or                            gangrene                            R13.14, Dysphagia, pharyngoesophageal phase CPT copyright 2018 [employer]. All rights reserved. The codes documented in this report are preliminary and upon coder review may  be revised to meet current compliance requirements. Hildred Laser, MD Hildred Laser, MD 07/11/2018 12:14:28 PM This report has been signed electronically. Number of Addenda: 0

## 2018-07-11 NOTE — Discharge Instructions (Signed)
Decrease pantoprazole to 40 mg by mouth 30 minutes before breakfast daily. Resume clopidogrel on 07/13/2018.  Remember to take clopidogrel in the evening since you take pantoprazole in the morning. Resume other medications as before. Resume usual diet. Please call office if swallowing difficulty recurs.  Upper Endoscopy, Adult, Care After This sheet gives you information about how to care for yourself after your procedure. Your health care provider may also give you more specific instructions. If you have problems or questions, contact your health care provider. What can I expect after the procedure? After the procedure, it is common to have:  A sore throat.  Mild stomach pain or discomfort.  Bloating.  Nausea. Follow these instructions at home:   Follow instructions from your health care provider about what to eat or drink after your procedure.  Return to your normal activities as told by your health care provider. Ask your health care provider what activities are safe for you.  Take over-the-counter and prescription medicines only as told by your health care provider.  Do not drive for 24 hours if you were given a sedative during your procedure.  Keep all follow-up visits as told by your health care provider. This is important. Contact a health care provider if you have:  A sore throat that lasts longer than one day.  Trouble swallowing. Get help right away if:  You vomit blood or your vomit looks like coffee grounds.  You have: ? A fever. ? Bloody, black, or tarry stools. ? A severe sore throat or you cannot swallow. ? Difficulty breathing. ? Severe pain in your chest or abdomen. Summary  After the procedure, it is common to have a sore throat, mild stomach discomfort, bloating, and nausea.  Do not drive for 24 hours if you were given a sedative during the procedure.  Follow instructions from your health care provider about what to eat or drink after your  procedure.  Return to your normal activities as told by your health care provider. This information is not intended to replace advice given to you by your health care provider. Make sure you discuss any questions you have with your health care provider. Document Released: 11/21/2011 Document Revised: 10/22/2017 Document Reviewed: 10/22/2017 Elsevier Interactive Patient Education  2019 [place], Adult     A hernia happens when tissue inside your body pushes out through a weak spot in your belly muscles (abdominal wall). This makes a round lump (bulge). The lump may be:  In a scar from surgery that was done in your belly (incisional hernia).  Near your belly button (umbilical hernia).  In your groin (inguinal hernia). Your groin is the area where your leg meets your lower belly (abdomen). This kind of hernia could also be: ? In your scrotum, if you are female. ? In folds of skin around your vagina, if you are female.  In your upper thigh (femoral hernia).  Inside your belly (hiatal hernia). This happens when your stomach slides above the muscle between your belly and your chest (diaphragm). If your hernia is small and it does not cause pain, you may not need treatment. If your hernia is large or it causes pain, you may need surgery. Follow these instructions at home: Activity  Avoid stretching or overusing (straining) the muscles near your hernia. Straining can happen when you: ? Lift something heavy. ? Poop (have a bowel movement).  Do not lift anything that is heavier than 10 lb (4.5 kg), or the  limit that you are told, until your doctor says that it is safe.  Use the strength of your legs when you lift something heavy. Do not use only your back muscles to lift. General instructions  Do these things if told by your doctor so you do not have trouble pooping (constipation): ? Drink enough fluid to keep your pee (urine) pale yellow. ? Eat foods that are high in  fiber. These include fresh fruits and vegetables, whole grains, and beans. ? Limit foods that are high in fat and processed sugars. These include foods that are fried or sweet. ? Take medicine for trouble pooping.  When you cough, try to cough gently.  You may try to push your hernia in by very gently pressing on it when you are lying down. Do not try to force the bulge back in if it will not push in easily.  If you are overweight, work with your doctor to lose weight safely.  Do not use any products that have nicotine or tobacco in them. These include cigarettes and e-cigarettes. If you need help quitting, ask your doctor.  If you will be having surgery (hernia repair), watch your hernia for changes in shape, size, or color. Tell your doctor if you see any changes.  Take over-the-counter and prescription medicines only as told by your doctor.  Keep all follow-up visits as told by your doctor. Contact a doctor if:  You get new pain, swelling, or redness near your hernia.  You poop fewer times in a week than normal.  You have trouble pooping.  You have poop (stool) that is more dry than normal.  You have poop that is harder or larger than normal. Get help right away if:  You have a fever.  You have belly pain that gets worse.  You feel sick to your stomach (nauseous).  You throw up (vomit).  Your hernia cannot be pushed in by very gently pressing on it when you are lying down. Do not try to force the bulge back in if it will not push in easily.  Your hernia: ? Changes in shape or size. ? Changes color. ? Feels hard or it hurts when you touch it. These symptoms may represent a serious problem that is an emergency. Do not wait to see if the symptoms will go away. Get medical help right away. Call your local emergency services (911 in the [place] Summary  A hernia happens when tissue inside your body pushes out through a weak spot in the belly muscles. This creates a  bulge.  If your hernia is small and it does not hurt, you may not need treatment. If your hernia is large or it hurts, you may need surgery.  If you will be having surgery, watch your hernia for changes in shape, size, or color. Tell your doctor about any changes. This information is not intended to replace advice given to you by your health care provider. Make sure you discuss any questions you have with your health care provider. Document Released: 11/09/2009 Document Revised: 02/21/2017 Document Reviewed: 02/21/2017 Elsevier Interactive Patient Education  2019 [employer].

## 2018-07-17 ENCOUNTER — Encounter (HOSPITAL_COMMUNITY): Payer: Self-pay | Admitting: Internal Medicine

## 2018-07-17 DIAGNOSIS — H401111 Primary open-angle glaucoma, right eye, mild stage: Secondary | ICD-10-CM | POA: Diagnosis not present

## 2018-07-17 DIAGNOSIS — H401122 Primary open-angle glaucoma, left eye, moderate stage: Secondary | ICD-10-CM | POA: Diagnosis not present

## 2019-01-02 ENCOUNTER — Other Ambulatory Visit: Payer: Self-pay

## 2019-04-11 DIAGNOSIS — R300 Dysuria: Secondary | ICD-10-CM | POA: Diagnosis not present

## 2019-04-11 DIAGNOSIS — I69351 Hemiplegia and hemiparesis following cerebral infarction affecting right dominant side: Secondary | ICD-10-CM | POA: Diagnosis not present

## 2019-04-11 DIAGNOSIS — R4781 Slurred speech: Secondary | ICD-10-CM | POA: Diagnosis not present

## 2019-04-11 DIAGNOSIS — R4020 Unspecified coma: Secondary | ICD-10-CM | POA: Diagnosis not present

## 2019-04-11 DIAGNOSIS — I959 Hypotension, unspecified: Secondary | ICD-10-CM | POA: Diagnosis not present

## 2019-04-11 DIAGNOSIS — E860 Dehydration: Secondary | ICD-10-CM | POA: Diagnosis not present

## 2019-04-11 DIAGNOSIS — R404 Transient alteration of awareness: Secondary | ICD-10-CM | POA: Diagnosis not present

## 2019-04-11 DIAGNOSIS — R55 Syncope and collapse: Secondary | ICD-10-CM | POA: Diagnosis not present

## 2019-05-08 ENCOUNTER — Other Ambulatory Visit (INDEPENDENT_AMBULATORY_CARE_PROVIDER_SITE_OTHER): Payer: Self-pay | Admitting: Internal Medicine

## 2019-10-06 ENCOUNTER — Encounter: Payer: Self-pay | Admitting: Family Medicine

## 2019-10-06 ENCOUNTER — Ambulatory Visit (INDEPENDENT_AMBULATORY_CARE_PROVIDER_SITE_OTHER): Payer: PPO | Admitting: Family Medicine

## 2019-10-06 ENCOUNTER — Other Ambulatory Visit: Payer: Self-pay

## 2019-10-06 VITALS — BP 141/82 | HR 78 | Temp 97.7°F | Ht 62.5 in | Wt 172.0 lb

## 2019-10-06 DIAGNOSIS — D649 Anemia, unspecified: Secondary | ICD-10-CM

## 2019-10-06 DIAGNOSIS — Z8673 Personal history of transient ischemic attack (TIA), and cerebral infarction without residual deficits: Secondary | ICD-10-CM

## 2019-10-06 DIAGNOSIS — K279 Peptic ulcer, site unspecified, unspecified as acute or chronic, without hemorrhage or perforation: Secondary | ICD-10-CM

## 2019-10-06 DIAGNOSIS — I10 Essential (primary) hypertension: Secondary | ICD-10-CM

## 2019-10-06 DIAGNOSIS — R4701 Aphasia: Secondary | ICD-10-CM

## 2019-10-06 NOTE — Progress Notes (Signed)
New Patient Office Visit  Subjective:  Patient ID: [name], female    DOB: 12-01-1934  Age: 85 y.o. MRN: [id number]  CC:  Chief Complaint  Patient presents with  . Establish Care    HPI [name] presents for h/o CVA-expressive aphasia, hip fracture 8 years post CVA caused ambulatory concerns-pts husband care giver. HTN-lisinopril/HCTZ daily-no recent labwork PUD-NSAIDs-protonix daily Anemia-no recent blood work Recent EGD Past Medical History:  Diagnosis Date  . 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 Constipation   . Degenerative joint disease of knee, right    right knee  . Hearing impaired person    wears hearing aids  . High cholesterol   . History of blood transfusion   . Hx: UTI (urinary tract infection)   . Hypertension   . Kidney stones   . 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 APPENDECTOMY    . ESOPHAGEAL DILATION N/A 07/11/2018   Procedure: ESOPHAGEAL DILATION;  Surgeon: Rogene Houston, MD;  [facility];  Service: Endoscopy;  Laterality: N/A;  . ESOPHAGOGASTRODUODENOSCOPY N/A 04/28/2018   Procedure: ESOPHAGOGASTRODUODENOSCOPY (EGD);  Surgeon: Rogene Houston, MD;  [facility];  Service: Endoscopy;  Laterality: N/A;  . ESOPHAGOGASTRODUODENOSCOPY N/A 07/11/2018   Procedure: ESOPHAGOGASTRODUODENOSCOPY (EGD);  Surgeon: Rogene Houston, MD;  [facility];  Service: Endoscopy;  Laterality: N/A;  1200  . HARDWARE REMOVAL Right 10/05/2014   Procedure: HARDWARE REMOVAL;  Surgeon: Dorna Leitz, MD;  Location: [place];  Service: Orthopedics;  Laterality: Right;  . INTRAMEDULLARY (IM) NAIL INTERTROCHANTERIC Right 06/12/2014   Procedure: INTRAMEDULLARY (IM) NAIL INTERTROCHANTRIC RIGHT HIP;  Surgeon: Alta Corning, MD;  Location: [place];  Service: Orthopedics;  Laterality: Right;  .  kidney stones    . LITHOTRIPSY    . PEG PLACEMENT     and removal  . PEG TUBE REMOVAL    . RADIOLOGY WITH ANESTHESIA  04/22/2012   Procedure: RADIOLOGY WITH ANESTHESIA;  Surgeon: Rob Hickman, MD;  Location: [place];  Service: Radiology;  Laterality: N/A;  VERTEBRAL ARTERY STENT PLACEMENT AND CEREBRAL ANGIOGRAM  . TONSILLECTOMY    . TYMPANOPLASTY      Family History  Problem Relation Age of Onset  . CVA Father     Social History   Socioeconomic History  . Marital status: Married    Spouse name: Not on file  . Number of children: Not on file  . Years of education: Not on file  . Highest education level: Not on file  Occupational History  . Not on file  Tobacco Use  . Smoking status: Former Smoker    Quit date: 10/02/1983    Years since quitting: 36.0  . Smokeless tobacco: Never Used  Substance and Sexual Activity  . Alcohol use: Yes    Comment: occasional wine  . Drug use: No  . Sexual activity: Not on file  Other Topics Concern  . Not on file  Social History Narrative  . Not on file   Social Determinants of Health   Financial Resource Strain:   . Difficulty of Paying Living Expenses:   Food Insecurity:   . Worried About Charity fundraiser in the Last Year:   . Arboriculturist in the Last Year:   Transportation Needs:   .  Lack of Transportation (Medical):   Marland Kitchen Lack of Transportation (Non-Medical):   Physical Activity:   . Days of Exercise per Week:   . Minutes of Exercise per Session:   Stress:   . Feeling of Stress :   Social Connections:   . Frequency of Communication with Friends and Family:   . Frequency of Social Gatherings with Friends and Family:   . Attends Religious Services:   . Active Member of Clubs or Organizations:   . Attends Archivist Meetings:   Marland Kitchen Marital Status:   Intimate Partner Violence:   . Fear of Current or Ex-Partner:   . Emotionally Abused:   Marland Kitchen Physically Abused:   . Sexually Abused:     ROS Review of  Systems  Eyes:       Glaucoma  Respiratory: Negative.   Cardiovascular: Negative.   Gastrointestinal: Positive for constipation.       H/o of GI bleed-NSAIDs Protonix  Psychiatric/Behavioral: Negative.     Objective:   Today's Vitals: Today's Vitals   10/06/19 1522  BP: (!) 141/82  Pulse: 78  Temp: 97.7 F (36.5 C)  TempSrc: Temporal  SpO2: 95%  Weight: 172 lb (78 kg)  Height: 5' 2.5" (1.588 m)   Body mass index is 30.96 kg/m.  Physical Exam Constitutional:      Appearance: Normal appearance.  HENT:     Head: Normocephalic and atraumatic.  Cardiovascular:     Rate and Rhythm: Normal rate and regular rhythm.     Pulses: Normal pulses.     Heart sounds: Normal heart sounds.  Pulmonary:     Effort: Pulmonary effort is normal.     Breath sounds: Normal breath sounds.  Musculoskeletal:     Cervical back: Normal range of motion and neck supple.     Right lower leg: No edema.     Left lower leg: No edema.  Neurological:     Mental Status: She is alert.  Psychiatric:        Mood and Affect: Mood normal.        Behavior: Behavior normal.     Assessment & Plan:  1. Essential hypertension Lisinopril/HCTZ-daily-takes bp at home - COMPLETE METABOLIC PANEL WITH GFR - Lipid panel - TSH - Urinalysis  2. PUD (peptic ulcer disease) NSAIDs-stable as no longer taking - CBC w/Diff/Platelet  3. Anemia, unspecified type Cbc-no recent labwork-h/o NSAID use in the past-tylenol prn  4. Expressive aphasia Noted since CVA-recent EGD  5. History of CVA (cerebrovascular accident)-plavix Husband care giver-helped pt restore function, hip fracture caused current ambulatory difficulties-fell in xray Problem List Items Addressed This Visit      Cardiovascular and Mediastinum   Hypertension - Primary   Relevant Orders   COMPLETE METABOLIC PANEL WITH GFR   Lipid panel   TSH   Urinalysis     Digestive   PUD (peptic ulcer disease)   Relevant Orders   CBC w/Diff/Platelet       Outpatient Encounter Medications as of 10/06/2019  Medication Sig  . clopidogrel (PLAVIX) 75 MG tablet Take 1 tablet (75 mg total) by mouth daily. Start in 2 weeks  . latanoprost (XALATAN) 0.005 % ophthalmic solution Place 1 drop into both eyes at bedtime.  Marland Kitchen lisinopril-hydrochlorothiazide (PRINZIDE,ZESTORETIC) 20-12.5 MG per tablet Take 1 tablet by mouth daily.    . Multiple Vitamins-Minerals (CENTRUM ULTRA WOMENS) TABS Take 1 tablet by mouth daily.    . pantoprazole (PROTONIX) 40 MG tablet TAKE (1) TABLET BY MOUTH  ONCE DAILY.  . rosuvastatin (CRESTOR) 20 MG tablet Take 20 mg by mouth every evening.   . senna (SENOKOT) 8.6 MG tablet Take 1 tablet by mouth at bedtime.    No facility-administered encounter medications on file as of 10/06/2019.   History, physical exam and assessment 45 minutes Follow-up: blood work  [name] Hannah Beat, MD

## 2019-10-08 DIAGNOSIS — I10 Essential (primary) hypertension: Secondary | ICD-10-CM | POA: Diagnosis not present

## 2019-10-08 DIAGNOSIS — K279 Peptic ulcer, site unspecified, unspecified as acute or chronic, without hemorrhage or perforation: Secondary | ICD-10-CM | POA: Diagnosis not present

## 2019-10-08 NOTE — Patient Instructions (Signed)
labwork-May Find primary care physician

## 2019-10-09 ENCOUNTER — Other Ambulatory Visit: Payer: Self-pay | Admitting: Family Medicine

## 2019-10-09 ENCOUNTER — Other Ambulatory Visit: Payer: Self-pay

## 2019-10-09 DIAGNOSIS — N390 Urinary tract infection, site not specified: Secondary | ICD-10-CM

## 2019-10-09 LAB — CBC WITH DIFFERENTIAL/PLATELET
Absolute Monocytes: 642 cells/uL (ref 200–950)
Basophils Absolute: 131 cells/uL (ref 0–200)
Basophils Relative: 1.8 %
Eosinophils Absolute: 299 cells/uL (ref 15–500)
Eosinophils Relative: 4.1 %
HCT: 37.3 % (ref 35.0–45.0)
Hemoglobin: 11.8 g/dL (ref 11.7–15.5)
Lymphs Abs: 1416 cells/uL (ref 850–3900)
MCH: 26 pg — ABNORMAL LOW (ref 27.0–33.0)
MCHC: 31.6 g/dL — ABNORMAL LOW (ref 32.0–36.0)
MCV: 82.3 fL (ref 80.0–100.0)
MPV: 10.1 fL (ref 7.5–12.5)
Monocytes Relative: 8.8 %
Neutro Abs: 4811 cells/uL (ref 1500–7800)
Neutrophils Relative %: 65.9 %
Platelets: 319 10*3/uL (ref 140–400)
RBC: 4.53 10*6/uL (ref 3.80–5.10)
RDW: 12.3 % (ref 11.0–15.0)
Total Lymphocyte: 19.4 %
WBC: 7.3 10*3/uL (ref 3.8–10.8)

## 2019-10-09 LAB — COMPLETE METABOLIC PANEL WITH GFR
AG Ratio: 1.4 (calc) (ref 1.0–2.5)
ALT: 11 U/L (ref 6–29)
AST: 17 U/L (ref 10–35)
Albumin: 3.8 g/dL (ref 3.6–5.1)
Alkaline phosphatase (APISO): 84 U/L (ref 37–153)
BUN/Creatinine Ratio: 14 (calc) (ref 6–22)
BUN: 29 mg/dL — ABNORMAL HIGH (ref 7–25)
CO2: 31 mmol/L (ref 20–32)
Calcium: 9.2 mg/dL (ref 8.6–10.4)
Chloride: 105 mmol/L (ref 98–110)
Creat: 2.04 mg/dL — ABNORMAL HIGH (ref 0.60–0.88)
GFR, Est African American: 26 mL/min/{1.73_m2} — ABNORMAL LOW (ref >=60)
GFR, Est Non African American: 22 mL/min/{1.73_m2} — ABNORMAL LOW (ref >=60)
Globulin: 2.7 g/dL (calc) (ref 1.9–3.7)
Glucose, Bld: 101 mg/dL — ABNORMAL HIGH (ref 65–99)
Potassium: 4.2 mmol/L (ref 3.5–5.3)
Sodium: 144 mmol/L (ref 135–146)
Total Bilirubin: 0.6 mg/dL (ref 0.2–1.2)
Total Protein: 6.5 g/dL (ref 6.1–8.1)

## 2019-10-09 LAB — URINALYSIS
Bilirubin Urine: NEGATIVE
Glucose, UA: NEGATIVE
Ketones, ur: NEGATIVE
Nitrite: NEGATIVE
Specific Gravity, Urine: 1.014 (ref 1.001–1.03)
pH: 6 (ref 5.0–8.0)

## 2019-10-09 LAB — LIPID PANEL
Cholesterol: 163 mg/dL (ref <200)
HDL: 48 mg/dL — ABNORMAL LOW (ref >=50)
LDL Cholesterol (Calc): 88 mg/dL (calc)
Non-HDL Cholesterol (Calc): 115 mg/dL (calc) (ref <130)
Total CHOL/HDL Ratio: 3.4 (calc) (ref <5.0)
Triglycerides: 172 mg/dL — ABNORMAL HIGH (ref <150)

## 2019-10-09 LAB — TSH: TSH: 1.54 mIU/L (ref 0.40–4.50)

## 2019-10-09 MED ORDER — AMOXICILLIN 250 MG PO CAPS: 250.0000 mg | ORAL_CAPSULE | Freq: Two times a day (BID) | ORAL | 0 refills | Status: DC

## 2019-10-10 DIAGNOSIS — N390 Urinary tract infection, site not specified: Secondary | ICD-10-CM | POA: Diagnosis not present

## 2019-10-12 LAB — URINE CULTURE
MICRO NUMBER:: 10453771
SPECIMEN QUALITY:: ADEQUATE

## 2019-10-27 ENCOUNTER — Telehealth: Payer: Self-pay | Admitting: Family Medicine

## 2019-10-27 ENCOUNTER — Other Ambulatory Visit: Payer: Self-pay

## 2019-10-27 DIAGNOSIS — Z8673 Personal history of transient ischemic attack (TIA), and cerebral infarction without residual deficits: Secondary | ICD-10-CM

## 2019-10-27 DIAGNOSIS — I10 Essential (primary) hypertension: Secondary | ICD-10-CM

## 2019-10-27 MED ORDER — ROSUVASTATIN CALCIUM 20 MG PO TABS: 20.0000 mg | ORAL_TABLET | Freq: Every evening | ORAL | 1 refills | Status: DC

## 2019-10-27 MED ORDER — CLOPIDOGREL BISULFATE 75 MG PO TABS: 75.0000 mg | ORAL_TABLET | Freq: Every day | ORAL | 1 refills | Status: DC

## 2019-10-27 MED ORDER — LISINOPRIL-HYDROCHLOROTHIAZIDE 20-12.5 MG PO TABS: 1.0000 | ORAL_TABLET | Freq: Every day | ORAL | 1 refills | Status: DC

## 2019-10-27 NOTE — Telephone Encounter (Signed)
Patient is requesting a 3 month supply on the following medications to get her until she can find a new primary doctor.  rosuvastatin (CRESTOR) 20 MG tablet    lisinopril-hydrochlorothiazide (PRINZIDE,ZESTORETIC) 20-12.5 MG per tablet    clopidogrel (PLAVIX) 75 MG tablet    [place] [address] [place] [address] [place] Phone:  [phone]  Fax:  [phone]

## 2019-10-31 ENCOUNTER — Other Ambulatory Visit (INDEPENDENT_AMBULATORY_CARE_PROVIDER_SITE_OTHER): Payer: Self-pay | Admitting: Internal Medicine

## 2019-11-04 NOTE — Telephone Encounter (Signed)
Last seen 07/2019-needs yearly OV appt thanks. Refill sent to pharmacy

## 2019-11-17 DIAGNOSIS — H401112 Primary open-angle glaucoma, right eye, moderate stage: Secondary | ICD-10-CM | POA: Diagnosis not present

## 2019-11-17 DIAGNOSIS — Z8673 Personal history of transient ischemic attack (TIA), and cerebral infarction without residual deficits: Secondary | ICD-10-CM | POA: Diagnosis not present

## 2019-11-17 DIAGNOSIS — H25813 Combined forms of age-related cataract, bilateral: Secondary | ICD-10-CM | POA: Diagnosis not present

## 2019-11-17 DIAGNOSIS — H401123 Primary open-angle glaucoma, left eye, severe stage: Secondary | ICD-10-CM | POA: Diagnosis not present

## 2019-12-02 ENCOUNTER — Encounter: Payer: Self-pay | Admitting: Nurse Practitioner

## 2019-12-02 ENCOUNTER — Other Ambulatory Visit: Payer: Self-pay

## 2019-12-02 ENCOUNTER — Ambulatory Visit (INDEPENDENT_AMBULATORY_CARE_PROVIDER_SITE_OTHER): Payer: PPO | Admitting: Nurse Practitioner

## 2019-12-02 ENCOUNTER — Other Ambulatory Visit: Payer: Self-pay | Admitting: Nurse Practitioner

## 2019-12-02 VITALS — BP 110/76 | HR 67 | Temp 97.6°F | Resp 18 | Wt 175.8 lb

## 2019-12-02 DIAGNOSIS — Z0001 Encounter for general adult medical examination with abnormal findings: Secondary | ICD-10-CM

## 2019-12-02 DIAGNOSIS — N184 Chronic kidney disease, stage 4 (severe): Secondary | ICD-10-CM | POA: Diagnosis not present

## 2019-12-02 DIAGNOSIS — N390 Urinary tract infection, site not specified: Secondary | ICD-10-CM

## 2019-12-02 DIAGNOSIS — Z0000 Encounter for general adult medical examination without abnormal findings: Secondary | ICD-10-CM

## 2019-12-02 DIAGNOSIS — Z7689 Persons encountering health services in other specified circumstances: Secondary | ICD-10-CM

## 2019-12-02 LAB — URINALYSIS, ROUTINE W REFLEX MICROSCOPIC
Bilirubin Urine: NEGATIVE
Glucose, UA: NEGATIVE
Hgb urine dipstick: NEGATIVE
Hyaline Cast: NONE SEEN /LPF
Ketones, ur: NEGATIVE
Nitrite: NEGATIVE
Protein, ur: NEGATIVE
RBC / HPF: NONE SEEN /HPF (ref 0–2)
Specific Gravity, Urine: 1.02 (ref 1.001–1.03)
pH: 5.5 (ref 5.0–8.0)

## 2019-12-02 LAB — MICROSCOPIC MESSAGE

## 2019-12-02 MED ORDER — AMOXICILLIN 250 MG PO CAPS: 250.0000 mg | ORAL_CAPSULE | Freq: Two times a day (BID) | ORAL | 0 refills | Status: AC

## 2019-12-02 NOTE — Progress Notes (Signed)
Pt has uti I called in antibiotic please let pt spouse know

## 2019-12-02 NOTE — Progress Notes (Addendum)
New Patient Office Visit  Subjective:  Patient ID: [name], female    DOB: [birth year]  Age: 85 y.o. MRN: [id number]  CC:  Chief Complaint  Patient presents with  . Establish Care    HPI [name] is a 85 year old female accompanied by her husband presenting to clinic to establish care and general adult examination. The pts PMH and medications have been reviewed and discussed with the spouse who is her primary caregiver related to some moderate deficits that a CVA  From June of 2018 has left the pt with. She is able to function caring for herself with some help from him aiding her at home. She has a handicap well adapted shower and a Rolator for ambulation. She dresses, feeds, and grooms herself. Her spouse does layout her meds but she does administer them herself. She does have mild expressive aphasia.   Pt has non pitting mild mild edema bilateral ankles. Not wearing compression hose, not very active. Will recommend compression stockings and exercise such as walking, flexion/extention, follow up as needed. Limit sodium intake 2 Grams per 24 hour period.   Will have spouse activate mychart as discussed, provider will review records, disease prevention screenings will be reviewed, directions will be added to treatment plan, no labs today as completed at previous pcp last month. Will refer to nephology as discussed today GFR low 20's and Creat slightly above 2 no other prior labs to review, also collection of urine today. Medication review to be completed as well for possible contributors.   Past Medical History:  Diagnosis Date  . 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 Constipation   . Degenerative joint disease of knee, right    right knee  . Hearing impaired person    wears hearing aids  . High cholesterol   . History of blood transfusion   . Hx: UTI (urinary tract infection)   . Hypertension   . Kidney stones   . 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 APPENDECTOMY    . ESOPHAGEAL DILATION N/A 07/11/2018   Procedure: ESOPHAGEAL DILATION;  Surgeon: Rogene Houston, MD;  [facility];  Service: Endoscopy;  Laterality: N/A;  . ESOPHAGOGASTRODUODENOSCOPY N/A 04/28/2018   Procedure: ESOPHAGOGASTRODUODENOSCOPY (EGD);  Surgeon: Rogene Houston, MD;  [facility];  Service: Endoscopy;  Laterality: N/A;  . ESOPHAGOGASTRODUODENOSCOPY N/A 07/11/2018   Procedure: ESOPHAGOGASTRODUODENOSCOPY (EGD);  Surgeon: Rogene Houston, MD;  [facility];  Service: Endoscopy;  Laterality: N/A;  1200  . HARDWARE REMOVAL Right 10/05/2014   Procedure: HARDWARE REMOVAL;  Surgeon: Dorna Leitz, MD;  Location: [place];  Service: Orthopedics;  Laterality: Right;  . INTRAMEDULLARY (IM) NAIL INTERTROCHANTERIC Right 06/12/2014   Procedure: INTRAMEDULLARY (IM) NAIL INTERTROCHANTRIC RIGHT HIP;  Surgeon: Alta Corning, MD;  Location: [place];  Service: Orthopedics;  Laterality: Right;  . kidney stones    . LITHOTRIPSY    . PEG PLACEMENT     and removal  . PEG TUBE REMOVAL    . RADIOLOGY WITH ANESTHESIA  04/22/2012   Procedure: RADIOLOGY WITH ANESTHESIA;  Surgeon: Rob Hickman, MD;  Location: [place];  Service: Radiology;  Laterality: N/A;  VERTEBRAL ARTERY STENT PLACEMENT AND CEREBRAL ANGIOGRAM  . TONSILLECTOMY    . TYMPANOPLASTY  Family History  Problem Relation Age of Onset  . CVA Father     Social History   Socioeconomic History  . Marital status: Married    Spouse name: Not on file  . Number of children: Not on file  . Years of education: Not on file  . Highest education level: Not on file  Occupational History  . Not on file  Tobacco Use  . Smoking status: Former Smoker    Quit date: 10/02/1983    Years since quitting: 36.1  . Smokeless tobacco: Never Used  Vaping Use  . Vaping Use: Never  used  Substance and Sexual Activity  . Alcohol use: Yes    Comment: occasional wine  . Drug use: No  . Sexual activity: Not Currently  Other Topics Concern  . Not on file  Social History Narrative  . Not on file   Social Determinants of Health   Financial Resource Strain:   . Difficulty of Paying Living Expenses:   Food Insecurity:   . Worried About Charity fundraiser in the Last Year:   . Arboriculturist in the Last Year:   Transportation Needs:   . Film/video editor (Medical):   Marland Kitchen Lack of Transportation (Non-Medical):   Physical Activity: Inactive  . Days of Exercise per Week: 0 days  . Minutes of Exercise per Session: 0 min  Stress:   . Feeling of Stress :   Social Connections:   . Frequency of Communication with Friends and Family:   . Frequency of Social Gatherings with Friends and Family:   . Attends Religious Services:   . Active Member of Clubs or Organizations:   . Attends Archivist Meetings:   Marland Kitchen Marital Status:   Intimate Partner Violence:   . Fear of Current or Ex-Partner:   . Emotionally Abused:   Marland Kitchen Physically Abused:   . Sexually Abused:     ROS Review of Systems  All other systems reviewed and are negative.   Objective:   Today's Vitals: BP 110/76 (BP Location: Left Arm, Patient Position: Sitting, Cuff Size: Normal)   Pulse 67   Temp 97.6 F (36.4 C) (Temporal)   Resp 18   Wt 175 lb 12.8 oz (79.7 kg)   SpO2 97%   BMI 31.64 kg/m   Physical Exam Vitals and nursing note reviewed.  Constitutional:      General: She is not in acute distress.    Appearance: Normal appearance. She is well-developed and well-groomed. She is not ill-appearing or diaphoretic.  HENT:     Head: Normocephalic.     Right Ear: Tympanic membrane, ear canal and external ear normal.     Left Ear: Tympanic membrane, ear canal and external ear normal.     Ears:     Comments: bil hearing aids    Nose: Nose normal.  Eyes:     General: Lids are normal.  Lids are everted, no foreign bodies appreciated. No scleral icterus.    Extraocular Movements: Extraocular movements intact.     Right eye: No nystagmus.     Left eye: No nystagmus.     Conjunctiva/sclera: Conjunctivae normal.     Right eye: Right conjunctiva is not injected. No hemorrhage.    Left eye: Left conjunctiva is not injected. No hemorrhage.    Pupils: Pupils are equal, round, and reactive to light.  Neck:     Thyroid: No thyromegaly or thyroid tenderness.     Vascular: No carotid  bruit or JVD.  Cardiovascular:     Rate and Rhythm: Normal rate and regular rhythm.     Chest Wall: No thrill.     Pulses: Normal pulses.     Heart sounds: Normal heart sounds, S1 normal and S2 normal.  Pulmonary:     Effort: Pulmonary effort is normal.     Breath sounds: Normal breath sounds.  Abdominal:     General: Bowel sounds are normal. There is no distension or abdominal bruit.     Palpations: Abdomen is soft. There is no hepatomegaly or splenomegaly.     Tenderness: There is no abdominal tenderness. There is no right CVA tenderness or left CVA tenderness.  Musculoskeletal:        General: Normal range of motion.     Cervical back: Normal, normal range of motion and neck supple.     Thoracic back: Normal.     Lumbar back: Normal.     Right lower leg: Edema present.     Left lower leg: Edema present.  Lymphadenopathy:     Head:     Right side of head: No submental, submandibular or tonsillar adenopathy.     Left side of head: No submental, submandibular or tonsillar adenopathy.     Cervical: No cervical adenopathy.  Skin:    General: Skin is warm and dry.     Capillary Refill: Capillary refill takes less than 2 seconds.     Coloration: Skin is not jaundiced.     Findings: No bruising or erythema.  Neurological:     General: No focal deficit present.     Mental Status: She is alert. Mental status is at baseline.     Gait: Gait abnormal.     Comments: Mild right side weakness from  old CVA uses a Rolator for ambulation  Psychiatric:        Mood and Affect: Mood normal.        Behavior: Behavior normal. Behavior is cooperative.     Assessment & Plan:  Established care, general adult exam completed.  Will have spouse activate mychart as discussed, -no labs today as completed at previous pcp last month.  -Will refer to nephology as discussed today GFR low 20's and Creat slightly -also collection of urine today.  - Consider and schedule pneumonia vaccination PCV13 and Dexa scan, they both appear to be due.  - Labs will be due in 5 months with follow up apt in this clinic with your provider for review. -Blood pressure goal 140/90 or less - follow a low fat, cholesterol, carbohydrate, sugar diet that is rich in fiber -drink plenty of water  Problem List Items Addressed This Visit    None    Visit Diagnoses    CKD (chronic kidney disease) stage 4, GFR 15-29 ml/min (HCC)    -  Primary   Relevant Orders   Urinalysis, Routine w reflex microscopic   Ambulatory referral to Nephrology   Frequent UTI       Relevant Orders   Urinalysis, Routine w reflex microscopic   Encounter to establish care       Adult general medical exam          Outpatient Encounter Medications as of 12/02/2019  Medication Sig  . clopidogrel (PLAVIX) 75 MG tablet Take 1 tablet (75 mg total) by mouth daily. Start in 2 weeks  . latanoprost (XALATAN) 0.005 % ophthalmic solution Place 1 drop into both eyes at bedtime.  Marland Kitchen lisinopril-hydrochlorothiazide (ZESTORETIC) 20-12.5 MG  tablet Take 1 tablet by mouth daily.  . Multiple Vitamins-Minerals (CENTRUM ULTRA WOMENS) TABS Take 1 tablet by mouth daily.    . pantoprazole (PROTONIX) 40 MG tablet TAKE (1) TABLET BY MOUTH ONCE DAILY.  . rosuvastatin (CRESTOR) 20 MG tablet Take 1 tablet (20 mg total) by mouth every evening.  . senna (SENOKOT) 8.6 MG tablet Take 1 tablet by mouth at bedtime.    No facility-administered encounter medications on file as of  12/02/2019.    Follow-up: Return in about 6 months (around 06/02/2020).   Annie Main, FNP

## 2019-12-29 DIAGNOSIS — H401112 Primary open-angle glaucoma, right eye, moderate stage: Secondary | ICD-10-CM | POA: Diagnosis not present

## 2019-12-29 DIAGNOSIS — H401123 Primary open-angle glaucoma, left eye, severe stage: Secondary | ICD-10-CM | POA: Diagnosis not present

## 2020-01-16 ENCOUNTER — Other Ambulatory Visit (HOSPITAL_COMMUNITY): Payer: Self-pay | Admitting: Nephrology

## 2020-01-16 DIAGNOSIS — I129 Hypertensive chronic kidney disease with stage 1 through stage 4 chronic kidney disease, or unspecified chronic kidney disease: Secondary | ICD-10-CM | POA: Diagnosis not present

## 2020-01-16 DIAGNOSIS — R600 Localized edema: Secondary | ICD-10-CM | POA: Diagnosis not present

## 2020-01-16 DIAGNOSIS — Z79899 Other long term (current) drug therapy: Secondary | ICD-10-CM | POA: Diagnosis not present

## 2020-01-16 DIAGNOSIS — E876 Hypokalemia: Secondary | ICD-10-CM | POA: Diagnosis not present

## 2020-01-16 DIAGNOSIS — N184 Chronic kidney disease, stage 4 (severe): Secondary | ICD-10-CM | POA: Diagnosis not present

## 2020-01-16 DIAGNOSIS — R809 Proteinuria, unspecified: Secondary | ICD-10-CM | POA: Diagnosis not present

## 2020-01-19 ENCOUNTER — Other Ambulatory Visit: Payer: Self-pay | Admitting: Nephrology

## 2020-01-19 ENCOUNTER — Other Ambulatory Visit (HOSPITAL_COMMUNITY): Payer: Self-pay | Admitting: Nephrology

## 2020-01-19 DIAGNOSIS — R600 Localized edema: Secondary | ICD-10-CM | POA: Diagnosis not present

## 2020-01-19 DIAGNOSIS — I129 Hypertensive chronic kidney disease with stage 1 through stage 4 chronic kidney disease, or unspecified chronic kidney disease: Secondary | ICD-10-CM | POA: Diagnosis not present

## 2020-01-19 DIAGNOSIS — N184 Chronic kidney disease, stage 4 (severe): Secondary | ICD-10-CM

## 2020-01-19 DIAGNOSIS — Z79899 Other long term (current) drug therapy: Secondary | ICD-10-CM | POA: Diagnosis not present

## 2020-01-19 DIAGNOSIS — R809 Proteinuria, unspecified: Secondary | ICD-10-CM | POA: Diagnosis not present

## 2020-01-19 DIAGNOSIS — E876 Hypokalemia: Secondary | ICD-10-CM | POA: Diagnosis not present

## 2020-01-22 ENCOUNTER — Ambulatory Visit (HOSPITAL_COMMUNITY)
Admission: RE | Admit: 2020-01-22 | Discharge: 2020-01-22 | Disposition: A | Discharge status: Home / Self Care | Payer: PPO | Source: Ambulatory Visit | Attending: Nephrology | Admitting: Nephrology

## 2020-01-22 ENCOUNTER — Other Ambulatory Visit: Payer: Self-pay

## 2020-01-22 DIAGNOSIS — I129 Hypertensive chronic kidney disease with stage 1 through stage 4 chronic kidney disease, or unspecified chronic kidney disease: Secondary | ICD-10-CM | POA: Diagnosis not present

## 2020-01-22 LAB — ECHOCARDIOGRAM COMPLETE
Area-P 1/2: 2.97 cm2
S' Lateral: 2.37 cm

## 2020-01-22 NOTE — Progress Notes (Signed)
*  PRELIMINARY RESULTS* Echocardiogram 2D Echocardiogram has been performed.  [name] 01/22/2020, 10:23 AM

## 2020-01-26 ENCOUNTER — Ambulatory Visit (INDEPENDENT_AMBULATORY_CARE_PROVIDER_SITE_OTHER): Payer: PPO | Admitting: Gastroenterology

## 2020-01-26 ENCOUNTER — Ambulatory Visit (HOSPITAL_COMMUNITY): Payer: PPO

## 2020-01-27 ENCOUNTER — Other Ambulatory Visit: Payer: Self-pay

## 2020-01-27 ENCOUNTER — Ambulatory Visit (HOSPITAL_COMMUNITY)
Admission: RE | Admit: 2020-01-27 | Discharge: 2020-01-27 | Disposition: A | Discharge status: Home / Self Care | Payer: PPO | Source: Ambulatory Visit | Attending: Nephrology | Admitting: Nephrology

## 2020-01-27 DIAGNOSIS — N184 Chronic kidney disease, stage 4 (severe): Secondary | ICD-10-CM | POA: Diagnosis not present

## 2020-01-27 DIAGNOSIS — N281 Cyst of kidney, acquired: Secondary | ICD-10-CM | POA: Diagnosis not present

## 2020-01-30 ENCOUNTER — Other Ambulatory Visit (INDEPENDENT_AMBULATORY_CARE_PROVIDER_SITE_OTHER): Payer: Self-pay | Admitting: Gastroenterology

## 2020-01-30 NOTE — Telephone Encounter (Signed)
[name] patient will need appointment in the next 3 months to continue getting refills on the PPI or she may get from her PCP. Pharmacy was sent a note along with the refill request.

## 2020-02-12 DIAGNOSIS — N184 Chronic kidney disease, stage 4 (severe): Secondary | ICD-10-CM | POA: Diagnosis not present

## 2020-02-12 DIAGNOSIS — R809 Proteinuria, unspecified: Secondary | ICD-10-CM | POA: Diagnosis not present

## 2020-02-12 DIAGNOSIS — R768 Other specified abnormal immunological findings in serum: Secondary | ICD-10-CM | POA: Diagnosis not present

## 2020-02-12 DIAGNOSIS — R7303 Prediabetes: Secondary | ICD-10-CM | POA: Diagnosis not present

## 2020-02-12 DIAGNOSIS — E211 Secondary hyperparathyroidism, not elsewhere classified: Secondary | ICD-10-CM | POA: Diagnosis not present

## 2020-02-12 DIAGNOSIS — N170 Acute kidney failure with tubular necrosis: Secondary | ICD-10-CM | POA: Diagnosis not present

## 2020-02-12 DIAGNOSIS — N281 Cyst of kidney, acquired: Secondary | ICD-10-CM | POA: Diagnosis not present

## 2020-02-16 DIAGNOSIS — H401112 Primary open-angle glaucoma, right eye, moderate stage: Secondary | ICD-10-CM | POA: Diagnosis not present

## 2020-02-16 DIAGNOSIS — H401123 Primary open-angle glaucoma, left eye, severe stage: Secondary | ICD-10-CM | POA: Diagnosis not present

## 2020-02-16 DIAGNOSIS — H25813 Combined forms of age-related cataract, bilateral: Secondary | ICD-10-CM | POA: Diagnosis not present

## 2020-02-27 DIAGNOSIS — Z87440 Personal history of urinary (tract) infections: Secondary | ICD-10-CM | POA: Diagnosis not present

## 2020-02-27 DIAGNOSIS — N390 Urinary tract infection, site not specified: Secondary | ICD-10-CM | POA: Diagnosis not present

## 2020-02-27 DIAGNOSIS — N184 Chronic kidney disease, stage 4 (severe): Secondary | ICD-10-CM | POA: Diagnosis not present

## 2020-02-27 DIAGNOSIS — R82998 Other abnormal findings in urine: Secondary | ICD-10-CM | POA: Diagnosis not present

## 2020-03-01 ENCOUNTER — Ambulatory Visit (INDEPENDENT_AMBULATORY_CARE_PROVIDER_SITE_OTHER): Payer: PPO | Admitting: Internal Medicine

## 2020-03-22 DIAGNOSIS — R7303 Prediabetes: Secondary | ICD-10-CM | POA: Diagnosis not present

## 2020-03-22 DIAGNOSIS — N281 Cyst of kidney, acquired: Secondary | ICD-10-CM | POA: Diagnosis not present

## 2020-03-22 DIAGNOSIS — E211 Secondary hyperparathyroidism, not elsewhere classified: Secondary | ICD-10-CM | POA: Diagnosis not present

## 2020-03-22 DIAGNOSIS — N170 Acute kidney failure with tubular necrosis: Secondary | ICD-10-CM | POA: Diagnosis not present

## 2020-03-22 DIAGNOSIS — R809 Proteinuria, unspecified: Secondary | ICD-10-CM | POA: Diagnosis not present

## 2020-03-22 DIAGNOSIS — R768 Other specified abnormal immunological findings in serum: Secondary | ICD-10-CM | POA: Diagnosis not present

## 2020-03-22 DIAGNOSIS — N184 Chronic kidney disease, stage 4 (severe): Secondary | ICD-10-CM | POA: Diagnosis not present

## 2020-03-26 DIAGNOSIS — N184 Chronic kidney disease, stage 4 (severe): Secondary | ICD-10-CM | POA: Diagnosis not present

## 2020-03-26 DIAGNOSIS — E211 Secondary hyperparathyroidism, not elsewhere classified: Secondary | ICD-10-CM | POA: Diagnosis not present

## 2020-03-26 DIAGNOSIS — N170 Acute kidney failure with tubular necrosis: Secondary | ICD-10-CM | POA: Diagnosis not present

## 2020-03-26 DIAGNOSIS — R809 Proteinuria, unspecified: Secondary | ICD-10-CM | POA: Diagnosis not present

## 2020-05-05 ENCOUNTER — Other Ambulatory Visit: Payer: Self-pay

## 2020-05-05 DIAGNOSIS — I10 Essential (primary) hypertension: Secondary | ICD-10-CM

## 2020-05-05 DIAGNOSIS — Z8673 Personal history of transient ischemic attack (TIA), and cerebral infarction without residual deficits: Secondary | ICD-10-CM

## 2020-05-05 MED ORDER — ROSUVASTATIN CALCIUM 20 MG PO TABS: 20.0000 mg | ORAL_TABLET | Freq: Every evening | ORAL | 1 refills | Status: DC

## 2020-05-06 IMAGING — US ULTRASOUND ABDOMEN LIMITED
1 series · 14 of 25 positions shown · non-contrast
Comparison: None.

CLINICAL DATA: Right upper quadrant with nausea vomiting

EXAM:
ULTRASOUND ABDOMEN LIMITED RIGHT UPPER QUADRANT

[Series 1: ultrasound abdomen limited · 0.14mm/px · 14 of 67 slices shown]
[im 1/67]
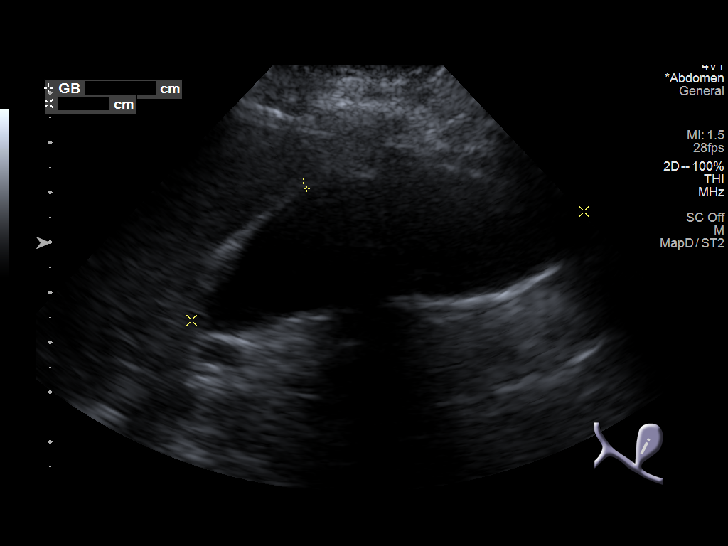
[im 6/67]
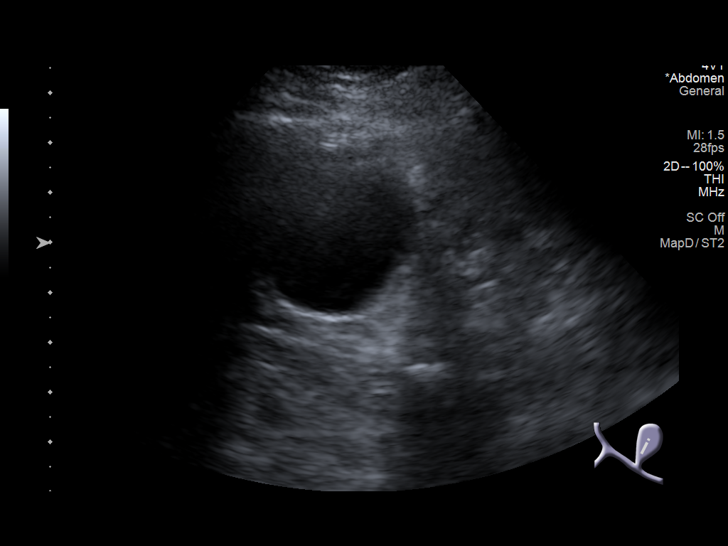
[im 12/67]
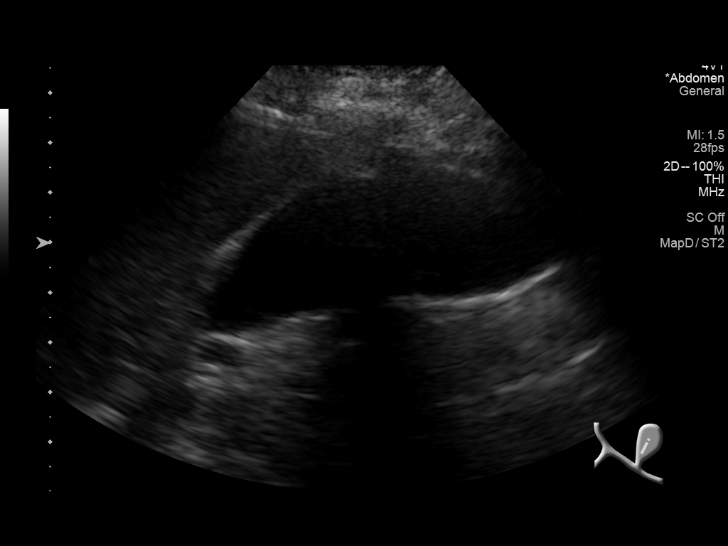
[im 17/67]
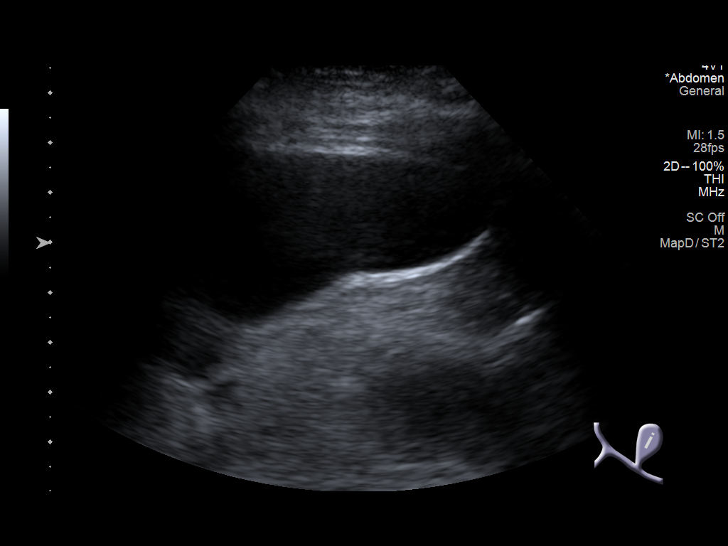
[im 23/67]
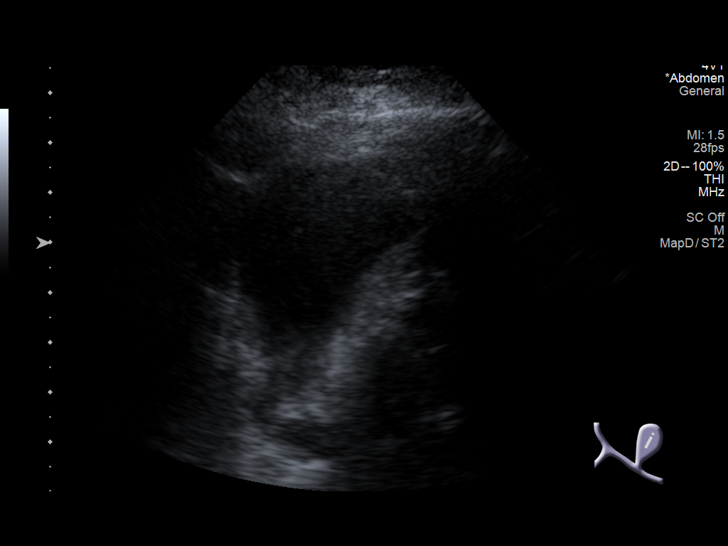
[im 25/67]
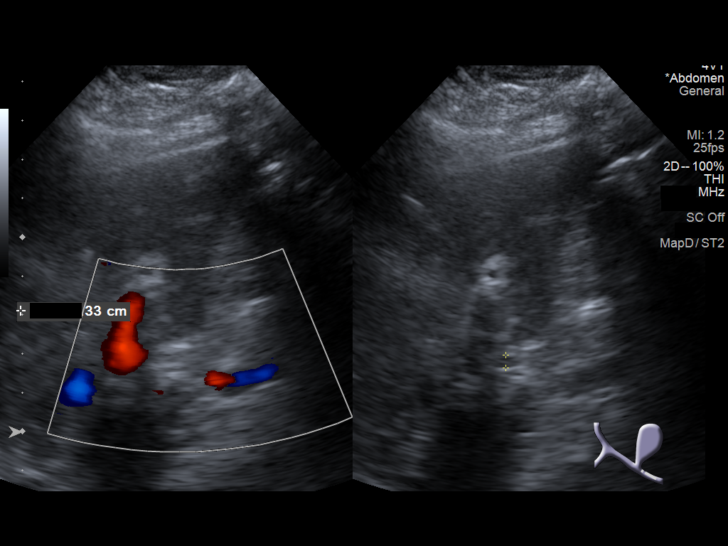
[im 31/67]
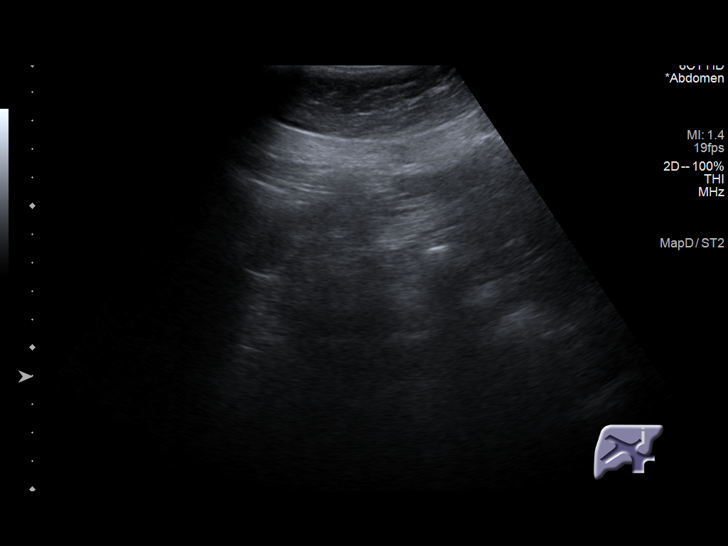
[im 36/67]
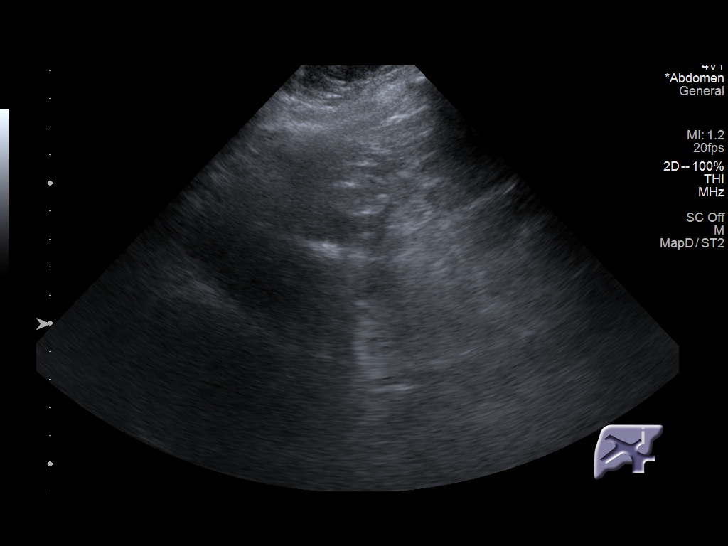
[im 42/67]
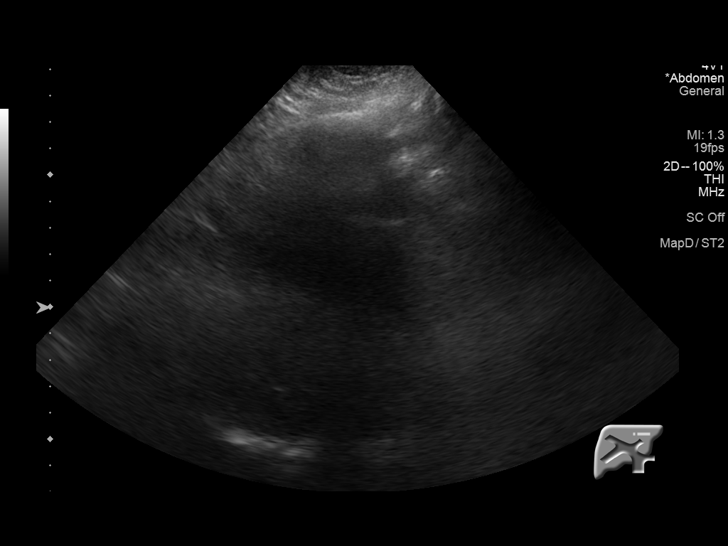
[im 45/67]
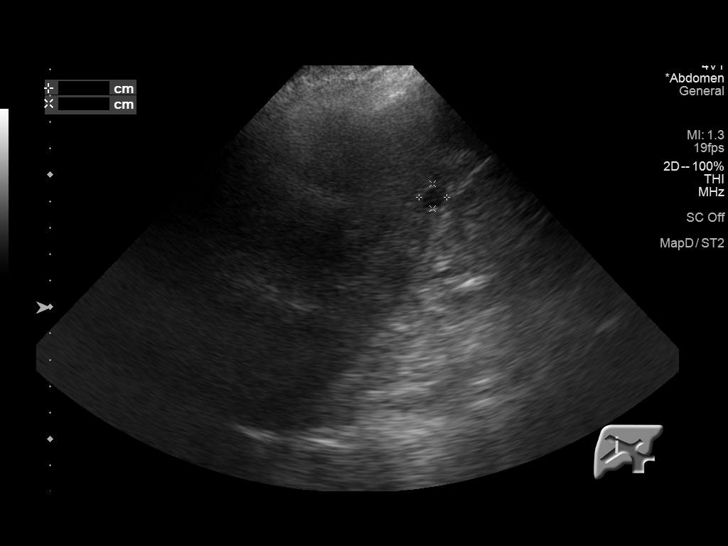
[im 50/67]
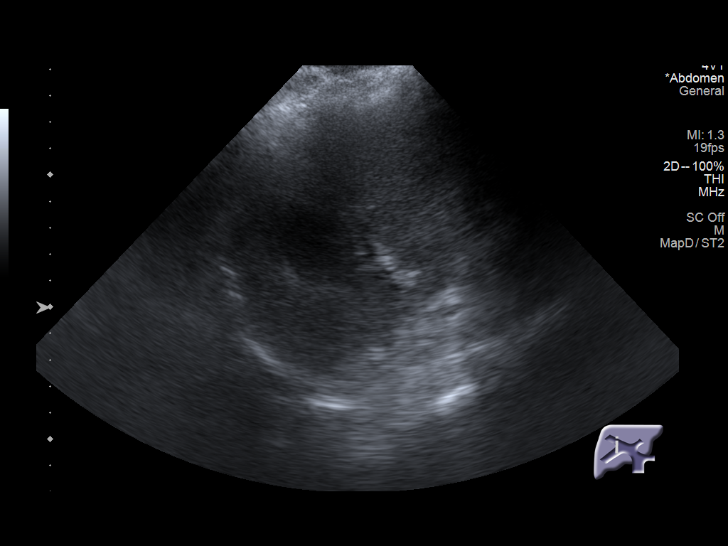
[im 56/67]
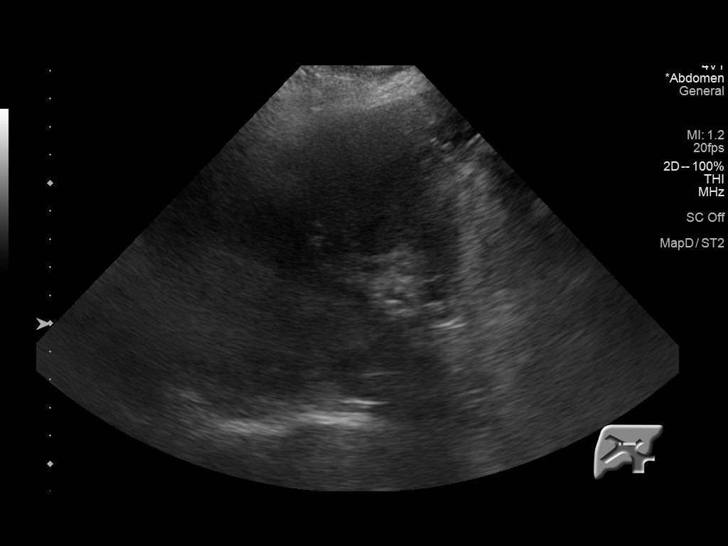
[im 61/67]
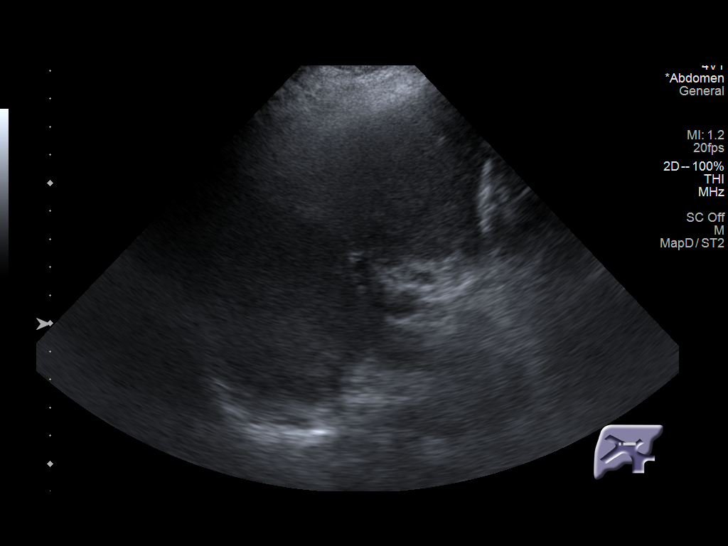
[im 67/67]
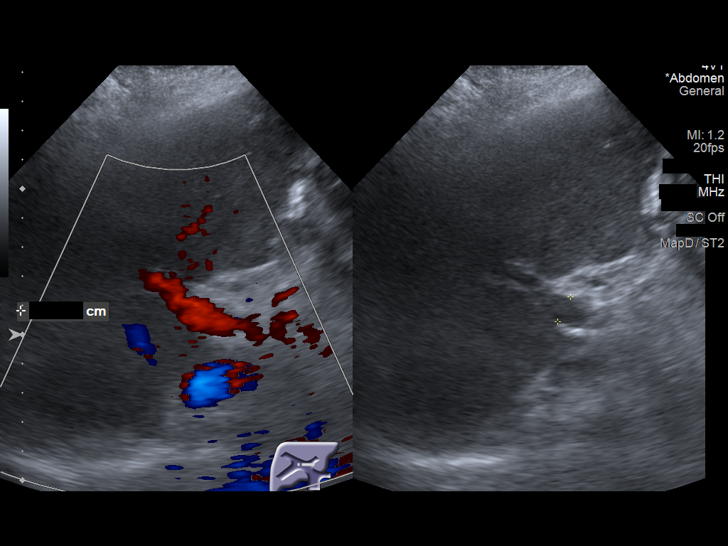

[14 of 25 positions shown; findings below may reference images not displayed]

FINDINGS: Gallbladder:

No gallstones or wall thickening visualized. No sonographic Murphy
sign noted by sonographer. Gallbladder is distended however this
could be within normal limits.

Common bile duct:

Diameter: 3 mm

Liver:

1 cm right hepatic cyst. No liver mass. Portal vein is patent on
color Doppler imaging with normal direction of blood flow towards
the liver.
IMPRESSION: Distended gallbladder without gallstones or gallbladder thickening.
Negative for biliary dilatation.

## 2020-05-15 IMAGING — CT CT HEAD W/O CM
3 series · 14 of 47 positions shown, 16 images · non-contrast
Comparison: May 13, 2011

CLINICAL DATA: Altered level of consciousness

EXAM:
CT HEAD WITHOUT CONTRAST
TECHNIQUE: Contiguous axial images were obtained from the base of the skull
through the vertex without intravenous contrast.

[Series 2: head wo · axial · 0.45mm/px · z∈[-276,-146]mm · 8 of 32 slices shown, 10 images]
[im 3/32  brain]
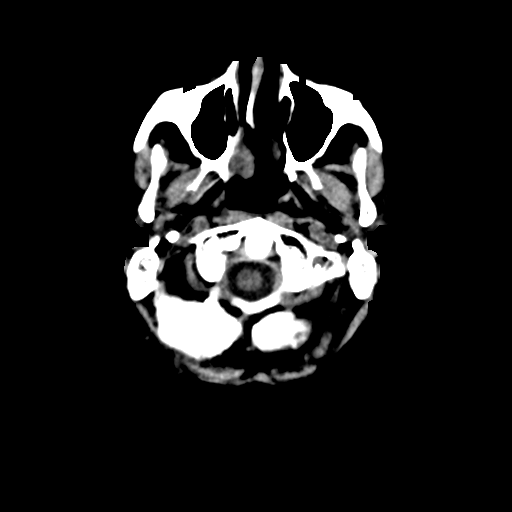
[im 3/32  bone]
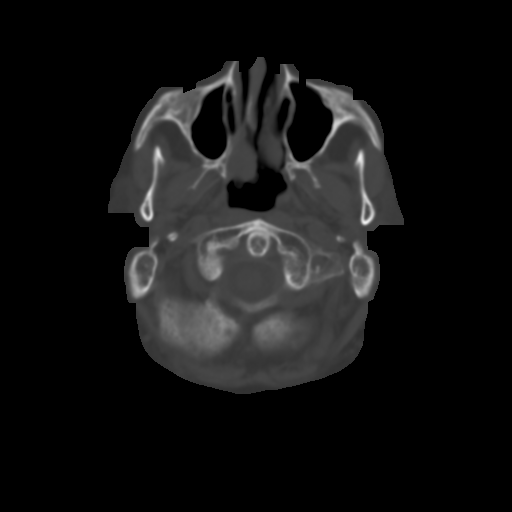
[im 7/32  brain]
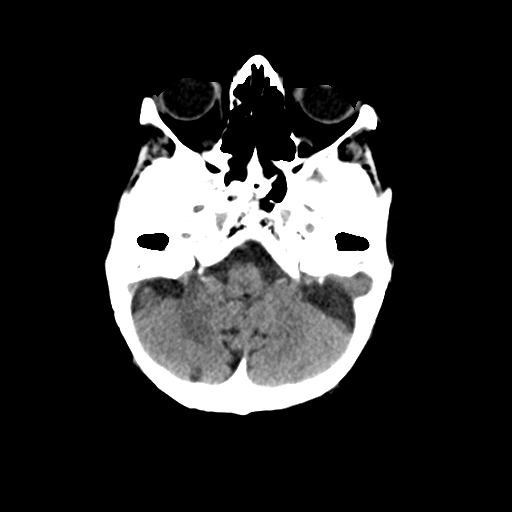
[im 10/32  brain]
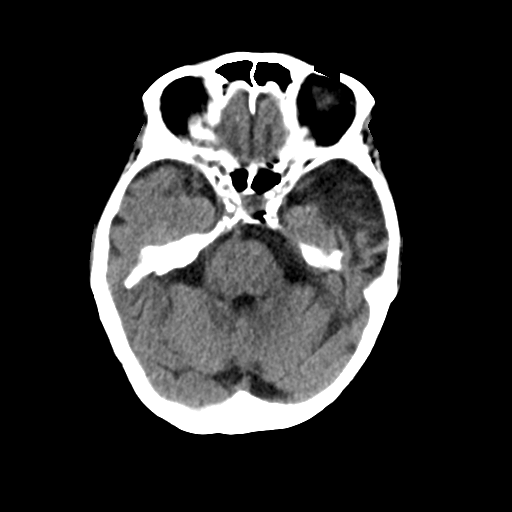
[im 14/32  brain]
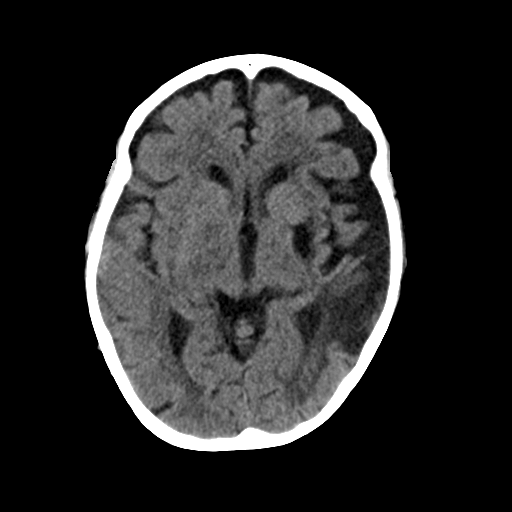
[im 18/32  brain]
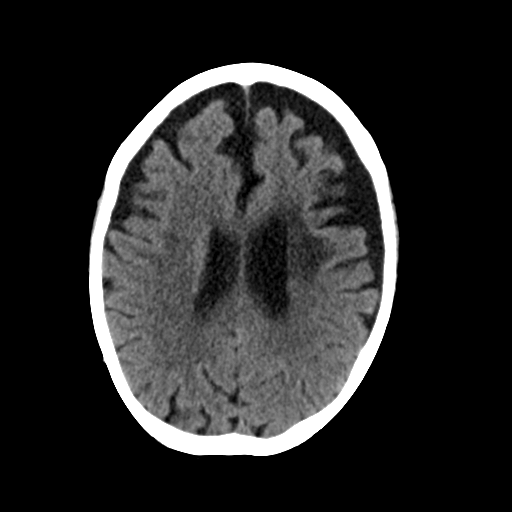
[im 18/32  bone]
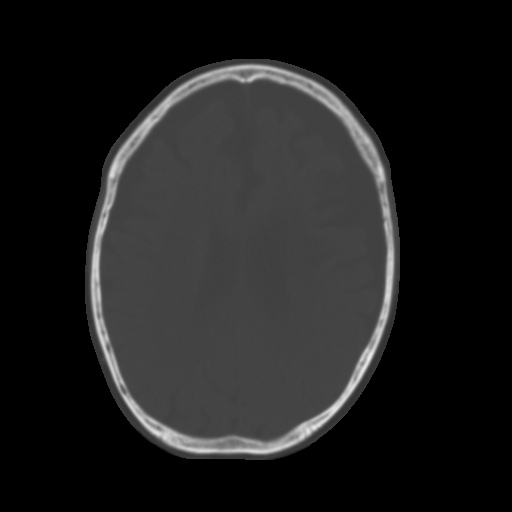
[im 22/32  brain]
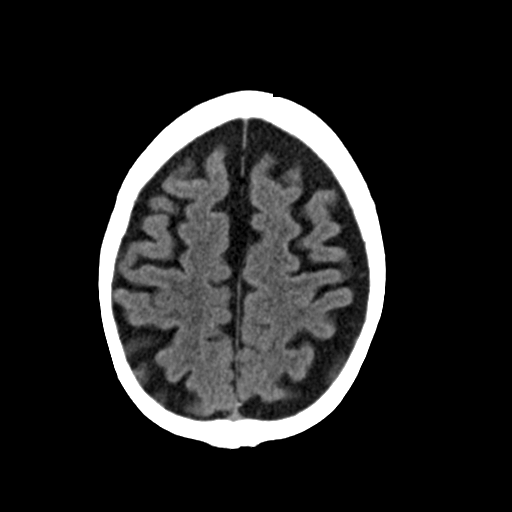
[im 25/32  brain]
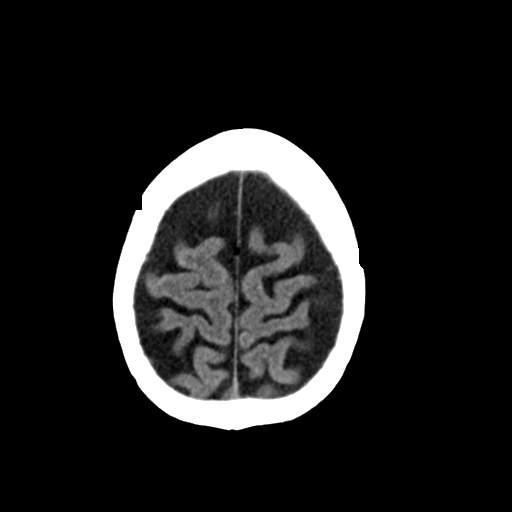
[im 29/32  brain]
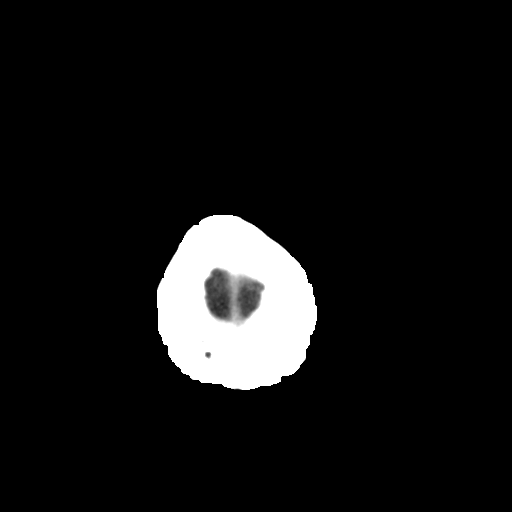

[Series 4: coronal soft tissue · coronal · 0.31mm/px · 3 of 65 slices shown]
[im 22/65  brain]
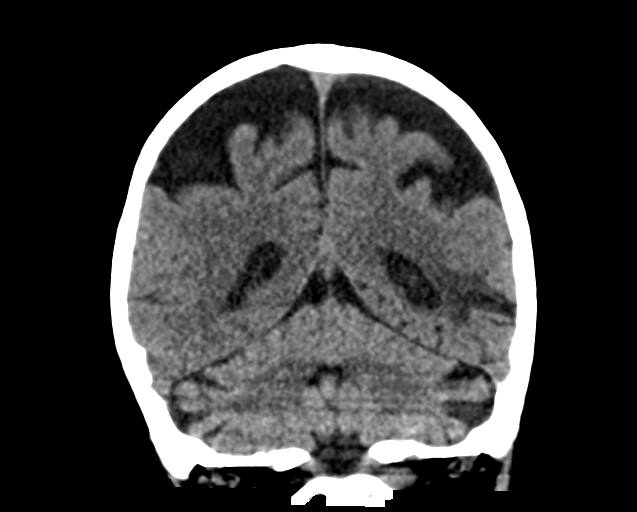
[im 29/65  brain]
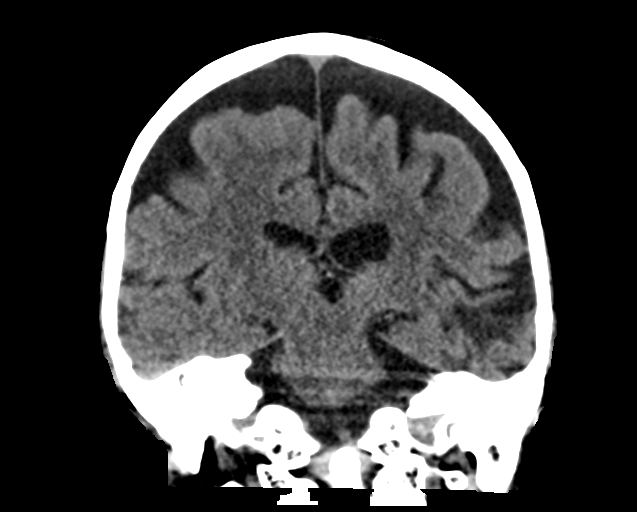
[im 36/65  brain]
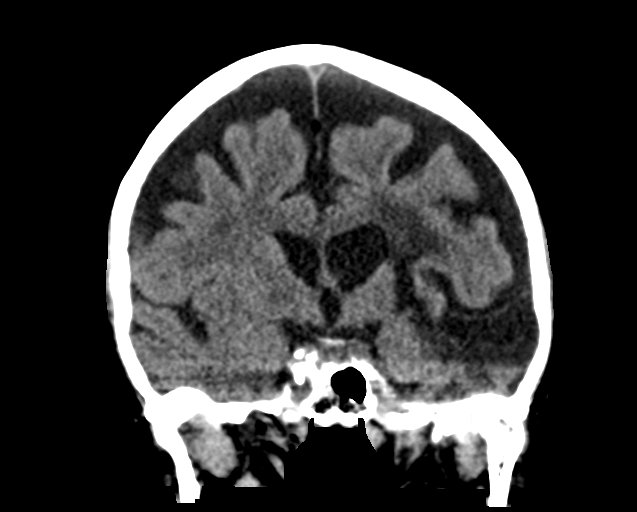

[Series 5: sagittal soft tissue · sagittal · 0.31mm/px · 3 of 57 slices shown]
[im 19/57  brain]
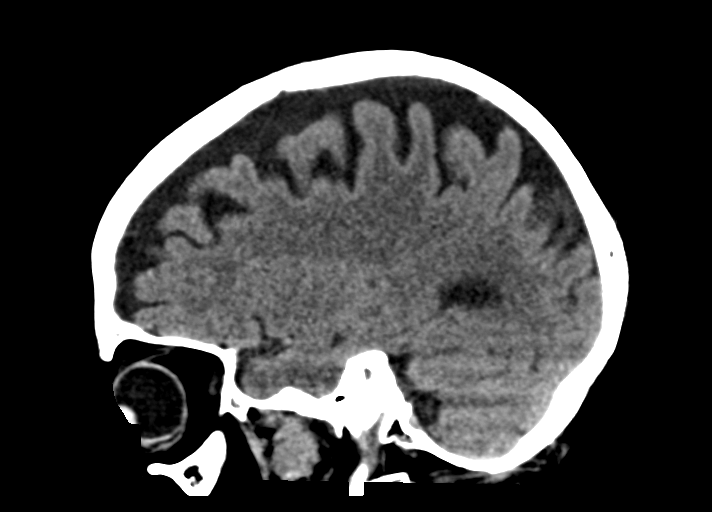
[im 29/57  brain]
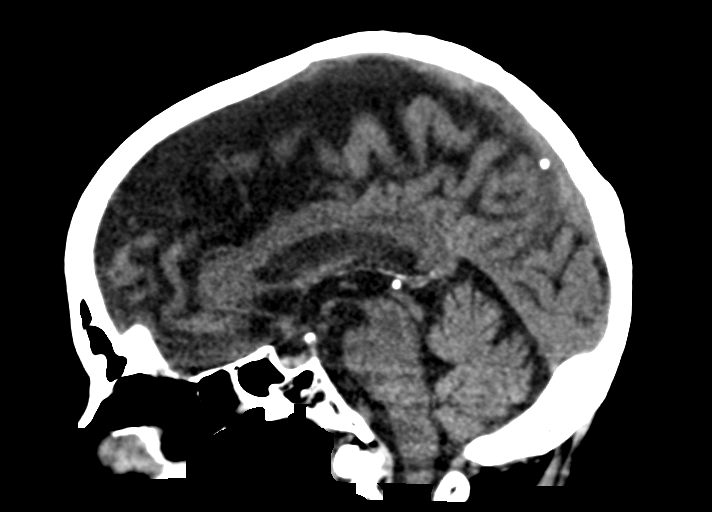
[im 38/57  brain]
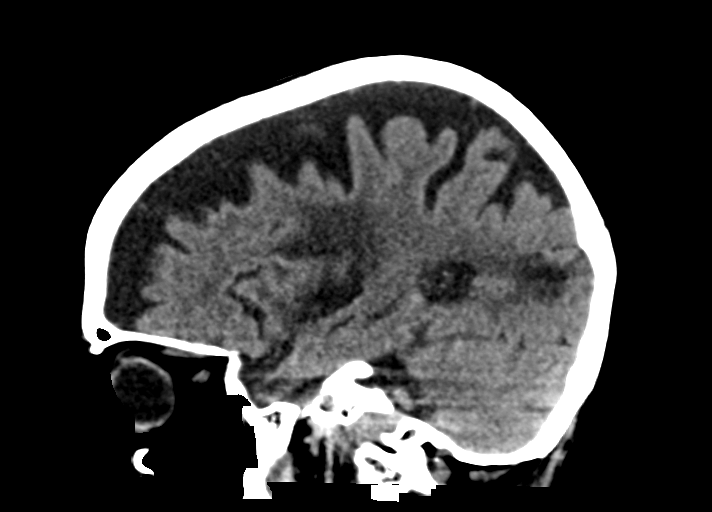

[14 of 47 positions shown; findings below may reference images not displayed]

FINDINGS: Brain: Ventricles appear normal in size and configuration for age
and are stable. There is extensive frontal and parietal lobe
atrophy, somewhat more severe on the left than on the right. There
appears to be a degree of chronic subdural hygroma on the left with
chronic mild mass effect on the left frontal lobe, stable. No change
in the appearance of the extra-axial fluid is apparent on this
study.

There is no intracranial mass. There is no hemorrhage in the
intra-axial or extra-axial regions. No midline shift.

There is encephalomalacia in portions of the left temporal lobe with
prior infarcts in the left posterior lentiform nucleus with
involvement of portions of the left internal and external capsules
posteriorly, stable. There is evidence of a prior infarct at the
left posterior temporal-anterior occipital junction. No acute
infarct is evident.

Vascular: No hyperdense vessel. There is calcification in each
carotid siphon region.

Skull: The bony calvarium appears intact.

Sinuses/Orbits: There is mucosal thickening in several ethmoid air
cells. Orbits appear symmetric bilaterally.

Other: Mastoids are essentially aplastic bilaterally.
IMPRESSION: 1. Extensive frontal and parietal lobe atrophy, more severe on the
left than on the right, stable from 4604. Subdural hygroma left
frontal region with chronic impression on the left frontal lobe,
stable. No acute hemorrhage. No mass or midline shift.

2. Encephalomalacia left temporal lobe with left basal ganglia
infarct posteriorly, stable. Prior left temporal occipital junction
infarct. No acute infarct evident.

3.  Foci of arterial vascular calcification noted.

4.  Mucosal thickening in several ethmoid air cells.

5.  Aplastic mastoids bilaterally.

## 2020-05-26 DIAGNOSIS — R809 Proteinuria, unspecified: Secondary | ICD-10-CM | POA: Diagnosis not present

## 2020-05-26 DIAGNOSIS — N170 Acute kidney failure with tubular necrosis: Secondary | ICD-10-CM | POA: Diagnosis not present

## 2020-05-26 DIAGNOSIS — E211 Secondary hyperparathyroidism, not elsewhere classified: Secondary | ICD-10-CM | POA: Diagnosis not present

## 2020-05-26 DIAGNOSIS — N184 Chronic kidney disease, stage 4 (severe): Secondary | ICD-10-CM | POA: Diagnosis not present

## 2020-06-02 DIAGNOSIS — N170 Acute kidney failure with tubular necrosis: Secondary | ICD-10-CM | POA: Diagnosis not present

## 2020-06-02 DIAGNOSIS — E211 Secondary hyperparathyroidism, not elsewhere classified: Secondary | ICD-10-CM | POA: Diagnosis not present

## 2020-06-02 DIAGNOSIS — N184 Chronic kidney disease, stage 4 (severe): Secondary | ICD-10-CM | POA: Diagnosis not present

## 2020-06-02 DIAGNOSIS — R809 Proteinuria, unspecified: Secondary | ICD-10-CM | POA: Diagnosis not present

## 2020-06-02 DIAGNOSIS — I5033 Acute on chronic diastolic (congestive) heart failure: Secondary | ICD-10-CM | POA: Diagnosis not present

## 2020-08-07 ENCOUNTER — Other Ambulatory Visit: Payer: Self-pay | Admitting: Family Medicine

## 2020-08-07 DIAGNOSIS — I10 Essential (primary) hypertension: Secondary | ICD-10-CM

## 2020-08-07 DIAGNOSIS — Z8673 Personal history of transient ischemic attack (TIA), and cerebral infarction without residual deficits: Secondary | ICD-10-CM

## 2020-08-16 DIAGNOSIS — H401123 Primary open-angle glaucoma, left eye, severe stage: Secondary | ICD-10-CM | POA: Diagnosis not present

## 2020-08-16 DIAGNOSIS — H401112 Primary open-angle glaucoma, right eye, moderate stage: Secondary | ICD-10-CM | POA: Diagnosis not present

## 2020-09-06 DIAGNOSIS — N390 Urinary tract infection, site not specified: Secondary | ICD-10-CM | POA: Diagnosis not present

## 2020-09-07 DIAGNOSIS — N390 Urinary tract infection, site not specified: Secondary | ICD-10-CM | POA: Diagnosis not present

## 2020-09-15 DIAGNOSIS — E785 Hyperlipidemia, unspecified: Secondary | ICD-10-CM | POA: Diagnosis not present

## 2020-09-15 DIAGNOSIS — K219 Gastro-esophageal reflux disease without esophagitis: Secondary | ICD-10-CM | POA: Diagnosis not present

## 2020-09-15 DIAGNOSIS — I129 Hypertensive chronic kidney disease with stage 1 through stage 4 chronic kidney disease, or unspecified chronic kidney disease: Secondary | ICD-10-CM | POA: Diagnosis not present

## 2020-09-15 DIAGNOSIS — N184 Chronic kidney disease, stage 4 (severe): Secondary | ICD-10-CM | POA: Diagnosis not present

## 2020-09-15 DIAGNOSIS — D692 Other nonthrombocytopenic purpura: Secondary | ICD-10-CM | POA: Diagnosis not present

## 2020-09-15 DIAGNOSIS — Z87440 Personal history of urinary (tract) infections: Secondary | ICD-10-CM | POA: Diagnosis not present

## 2020-09-28 DIAGNOSIS — H6122 Impacted cerumen, left ear: Secondary | ICD-10-CM | POA: Insufficient documentation

## 2020-09-28 DIAGNOSIS — H9113 Presbycusis, bilateral: Secondary | ICD-10-CM | POA: Insufficient documentation

## 2020-11-16 DIAGNOSIS — D692 Other nonthrombocytopenic purpura: Secondary | ICD-10-CM | POA: Diagnosis not present

## 2020-11-16 DIAGNOSIS — N390 Urinary tract infection, site not specified: Secondary | ICD-10-CM | POA: Diagnosis not present

## 2020-11-22 DIAGNOSIS — Z87440 Personal history of urinary (tract) infections: Secondary | ICD-10-CM | POA: Diagnosis not present

## 2020-11-22 DIAGNOSIS — Z09 Encounter for follow-up examination after completed treatment for conditions other than malignant neoplasm: Secondary | ICD-10-CM | POA: Diagnosis not present

## 2020-12-01 ENCOUNTER — Other Ambulatory Visit: Payer: Self-pay | Admitting: Family Medicine

## 2020-12-01 DIAGNOSIS — Z8673 Personal history of transient ischemic attack (TIA), and cerebral infarction without residual deficits: Secondary | ICD-10-CM

## 2020-12-19 ENCOUNTER — Telehealth (HOSPITAL_BASED_OUTPATIENT_CLINIC_OR_DEPARTMENT_OTHER): Payer: Self-pay | Admitting: Emergency Medicine

## 2020-12-19 ENCOUNTER — Other Ambulatory Visit: Payer: Self-pay

## 2020-12-19 ENCOUNTER — Emergency Department (HOSPITAL_COMMUNITY)
Admission: EM | Admit: 2020-12-19 | Discharge: 2020-12-19 | Disposition: A | Discharge status: Home / Self Care | Payer: PPO | Source: Home / Self Care | Attending: Emergency Medicine | Admitting: Emergency Medicine

## 2020-12-19 ENCOUNTER — Encounter (HOSPITAL_COMMUNITY): Payer: Self-pay | Admitting: Emergency Medicine

## 2020-12-19 ENCOUNTER — Emergency Department (HOSPITAL_COMMUNITY): Payer: PPO

## 2020-12-19 ENCOUNTER — Inpatient Hospital Stay (HOSPITAL_COMMUNITY)
Admission: EM | Admit: 2020-12-19 | Discharge: 2020-12-22 | DRG: 872 | Disposition: A | Discharge status: Home Health | Payer: PPO | Attending: Family Medicine | Admitting: Family Medicine

## 2020-12-19 DIAGNOSIS — R0902 Hypoxemia: Secondary | ICD-10-CM | POA: Diagnosis not present

## 2020-12-19 DIAGNOSIS — N390 Urinary tract infection, site not specified: Secondary | ICD-10-CM | POA: Insufficient documentation

## 2020-12-19 DIAGNOSIS — E8809 Other disorders of plasma-protein metabolism, not elsewhere classified: Secondary | ICD-10-CM | POA: Diagnosis not present

## 2020-12-19 DIAGNOSIS — N183 Chronic kidney disease, stage 3 unspecified: Secondary | ICD-10-CM

## 2020-12-19 DIAGNOSIS — K219 Gastro-esophageal reflux disease without esophagitis: Secondary | ICD-10-CM | POA: Diagnosis not present

## 2020-12-19 DIAGNOSIS — J9600 Acute respiratory failure, unspecified whether with hypoxia or hypercapnia: Secondary | ICD-10-CM | POA: Diagnosis not present

## 2020-12-19 DIAGNOSIS — N179 Acute kidney failure, unspecified: Secondary | ICD-10-CM | POA: Diagnosis present

## 2020-12-19 DIAGNOSIS — Z974 Presence of external hearing-aid: Secondary | ICD-10-CM | POA: Diagnosis not present

## 2020-12-19 DIAGNOSIS — E876 Hypokalemia: Secondary | ICD-10-CM | POA: Insufficient documentation

## 2020-12-19 DIAGNOSIS — T502X5A Adverse effect of carbonic-anhydrase inhibitors, benzothiadiazides and other diuretics, initial encounter: Secondary | ICD-10-CM | POA: Diagnosis present

## 2020-12-19 DIAGNOSIS — Z823 Family history of stroke: Secondary | ICD-10-CM | POA: Diagnosis not present

## 2020-12-19 DIAGNOSIS — Z79899 Other long term (current) drug therapy: Secondary | ICD-10-CM

## 2020-12-19 DIAGNOSIS — Z6832 Body mass index (BMI) 32.0-32.9, adult: Secondary | ICD-10-CM

## 2020-12-19 DIAGNOSIS — E46 Unspecified protein-calorie malnutrition: Secondary | ICD-10-CM

## 2020-12-19 DIAGNOSIS — E669 Obesity, unspecified: Secondary | ICD-10-CM | POA: Diagnosis not present

## 2020-12-19 DIAGNOSIS — H409 Unspecified glaucoma: Secondary | ICD-10-CM | POA: Diagnosis present

## 2020-12-19 DIAGNOSIS — I679 Cerebrovascular disease, unspecified: Secondary | ICD-10-CM | POA: Diagnosis not present

## 2020-12-19 DIAGNOSIS — E7800 Pure hypercholesterolemia, unspecified: Secondary | ICD-10-CM | POA: Diagnosis present

## 2020-12-19 DIAGNOSIS — R9431 Abnormal electrocardiogram [ECG] [EKG]: Secondary | ICD-10-CM

## 2020-12-19 DIAGNOSIS — Z8673 Personal history of transient ischemic attack (TIA), and cerebral infarction without residual deficits: Secondary | ICD-10-CM | POA: Diagnosis not present

## 2020-12-19 DIAGNOSIS — R6883 Chills (without fever): Secondary | ICD-10-CM | POA: Insufficient documentation

## 2020-12-19 DIAGNOSIS — Z20822 Contact with and (suspected) exposure to covid-19: Secondary | ICD-10-CM | POA: Insufficient documentation

## 2020-12-19 DIAGNOSIS — R7881 Bacteremia: Secondary | ICD-10-CM

## 2020-12-19 DIAGNOSIS — Z87442 Personal history of urinary calculi: Secondary | ICD-10-CM

## 2020-12-19 DIAGNOSIS — H9190 Unspecified hearing loss, unspecified ear: Secondary | ICD-10-CM | POA: Diagnosis present

## 2020-12-19 DIAGNOSIS — E66811 Obesity, class 1: Secondary | ICD-10-CM

## 2020-12-19 DIAGNOSIS — Z7902 Long term (current) use of antithrombotics/antiplatelets: Secondary | ICD-10-CM | POA: Insufficient documentation

## 2020-12-19 DIAGNOSIS — J9811 Atelectasis: Secondary | ICD-10-CM | POA: Diagnosis not present

## 2020-12-19 DIAGNOSIS — R509 Fever, unspecified: Secondary | ICD-10-CM | POA: Diagnosis not present

## 2020-12-19 DIAGNOSIS — A4151 Sepsis due to Escherichia coli [E. coli]: Principal | ICD-10-CM | POA: Diagnosis present

## 2020-12-19 DIAGNOSIS — N1832 Chronic kidney disease, stage 3b: Secondary | ICD-10-CM | POA: Diagnosis present

## 2020-12-19 DIAGNOSIS — M1710 Unilateral primary osteoarthritis, unspecified knee: Secondary | ICD-10-CM | POA: Diagnosis present

## 2020-12-19 DIAGNOSIS — I10 Essential (primary) hypertension: Secondary | ICD-10-CM | POA: Diagnosis present

## 2020-12-19 DIAGNOSIS — E441 Mild protein-calorie malnutrition: Secondary | ICD-10-CM | POA: Diagnosis not present

## 2020-12-19 DIAGNOSIS — D72829 Elevated white blood cell count, unspecified: Secondary | ICD-10-CM | POA: Diagnosis present

## 2020-12-19 DIAGNOSIS — Z87891 Personal history of nicotine dependence: Secondary | ICD-10-CM | POA: Insufficient documentation

## 2020-12-19 DIAGNOSIS — I1310 Hypertensive heart and chronic kidney disease without heart failure, with stage 1 through stage 4 chronic kidney disease, or unspecified chronic kidney disease: Secondary | ICD-10-CM | POA: Diagnosis not present

## 2020-12-19 DIAGNOSIS — R4182 Altered mental status, unspecified: Secondary | ICD-10-CM | POA: Diagnosis not present

## 2020-12-19 DIAGNOSIS — R079 Chest pain, unspecified: Secondary | ICD-10-CM | POA: Insufficient documentation

## 2020-12-19 DIAGNOSIS — E782 Mixed hyperlipidemia: Secondary | ICD-10-CM | POA: Diagnosis not present

## 2020-12-19 DIAGNOSIS — I517 Cardiomegaly: Secondary | ICD-10-CM | POA: Diagnosis not present

## 2020-12-19 DIAGNOSIS — E785 Hyperlipidemia, unspecified: Secondary | ICD-10-CM

## 2020-12-19 DIAGNOSIS — R6520 Severe sepsis without septic shock: Secondary | ICD-10-CM | POA: Diagnosis not present

## 2020-12-19 LAB — COMPREHENSIVE METABOLIC PANEL
ALT: 12 U/L (ref 0–44)
AST: 21 U/L (ref 15–41)
Albumin: 3.3 g/dL — ABNORMAL LOW (ref 3.5–5.0)
Alkaline Phosphatase: 80 U/L (ref 38–126)
Anion gap: 8 (ref 5–15)
BUN: 21 mg/dL (ref 8–23)
CO2: 25 mmol/L (ref 22–32)
Calcium: 8.4 mg/dL — ABNORMAL LOW (ref 8.9–10.3)
Chloride: 108 mmol/L (ref 98–111)
Creatinine, Ser: 1.63 mg/dL — ABNORMAL HIGH (ref 0.44–1.00)
GFR, Estimated: 31 mL/min — ABNORMAL LOW (ref >60)
Glucose, Bld: 123 mg/dL — ABNORMAL HIGH (ref 70–99)
Potassium: 2.8 mmol/L — ABNORMAL LOW (ref 3.5–5.1)
Sodium: 141 mmol/L (ref 135–145)
Total Bilirubin: 1.3 mg/dL — ABNORMAL HIGH (ref 0.3–1.2)
Total Protein: 6.3 g/dL — ABNORMAL LOW (ref 6.5–8.1)

## 2020-12-19 LAB — URINALYSIS, ROUTINE W REFLEX MICROSCOPIC
Bilirubin Urine: NEGATIVE
Glucose, UA: NEGATIVE mg/dL
Ketones, ur: NEGATIVE mg/dL
Nitrite: NEGATIVE
Protein, ur: 100 mg/dL — AB
RBC / HPF: >50 RBC/hpf — ABNORMAL HIGH (ref 0–5)
Specific Gravity, Urine: 1.008 (ref 1.005–1.030)
pH: 7 (ref 5.0–8.0)

## 2020-12-19 LAB — APTT: aPTT: 26 seconds (ref 24–36)

## 2020-12-19 LAB — CBC WITH DIFFERENTIAL/PLATELET
Abs Immature Granulocytes: 0.05 10*3/uL (ref 0.00–0.07)
Basophils Absolute: 0.1 10*3/uL (ref 0.0–0.1)
Basophils Relative: 1 %
Eosinophils Absolute: 0.1 10*3/uL (ref 0.0–0.5)
Eosinophils Relative: 0 %
HCT: 42.8 % (ref 36.0–46.0)
Hemoglobin: 13.5 g/dL (ref 12.0–15.0)
Immature Granulocytes: 0 %
Lymphocytes Relative: 2 %
Lymphs Abs: 0.3 10*3/uL — ABNORMAL LOW (ref 0.7–4.0)
MCH: 27.2 pg (ref 26.0–34.0)
MCHC: 31.5 g/dL (ref 30.0–36.0)
MCV: 86.3 fL (ref 80.0–100.0)
Monocytes Absolute: 0.3 10*3/uL (ref 0.1–1.0)
Monocytes Relative: 3 %
Neutro Abs: 11.2 10*3/uL — ABNORMAL HIGH (ref 1.7–7.7)
Neutrophils Relative %: 94 %
Platelets: 190 10*3/uL (ref 150–400)
RBC: 4.96 MIL/uL (ref 3.87–5.11)
RDW: 13.8 % (ref 11.5–15.5)
WBC: 12 10*3/uL — ABNORMAL HIGH (ref 4.0–10.5)
nRBC: 0 % (ref 0.0–0.2)

## 2020-12-19 LAB — PROTIME-INR
INR: 1 (ref 0.8–1.2)
Prothrombin Time: 13.5 seconds (ref 11.4–15.2)

## 2020-12-19 LAB — RESP PANEL BY RT-PCR (FLU A&B, COVID) ARPGX2
Influenza A by PCR: NEGATIVE
Influenza B by PCR: NEGATIVE
SARS Coronavirus 2 by RT PCR: NEGATIVE

## 2020-12-19 LAB — LACTIC ACID, PLASMA
Lactic Acid, Venous: 1.2 mmol/L (ref 0.5–1.9)
Lactic Acid, Venous: 1.1 mmol/L (ref 0.5–1.9)

## 2020-12-19 MED ORDER — LACTATED RINGERS IV BOLUS (SEPSIS): 1000.0000 mL | Freq: Once | INTRAVENOUS | Status: DC

## 2020-12-19 MED ORDER — LACTATED RINGERS IV BOLUS (SEPSIS)
1000.0000 mL | Freq: Once | INTRAVENOUS | Status: AC
  Administered 2020-12-20 (×2): 1000 mL via INTRAVENOUS

## 2020-12-19 MED ORDER — LACTATED RINGERS IV SOLN: INTRAVENOUS | Status: DC

## 2020-12-19 MED ORDER — LACTATED RINGERS IV BOLUS (SEPSIS)
500.0000 mL | Freq: Once | INTRAVENOUS | Status: AC
  Administered 2020-12-20: 500 mL via INTRAVENOUS

## 2020-12-19 MED ORDER — SODIUM CHLORIDE 0.9 % IV SOLN
1.0000 g | INTRAVENOUS | Status: DC
  Administered 2020-12-19: 1 g via INTRAVENOUS
  Filled 2020-12-19: qty 10

## 2020-12-19 MED ORDER — SODIUM CHLORIDE 0.9 % IV SOLN
1.0000 g | INTRAVENOUS | Status: DC
  Administered 2020-12-20: 1 g via INTRAVENOUS
  Filled 2020-12-19: qty 10

## 2020-12-19 MED ORDER — LACTATED RINGERS IV SOLN: INTRAVENOUS | Status: AC

## 2020-12-19 MED ORDER — MAGNESIUM SULFATE 2 GM/50ML IV SOLN
2.0000 g | INTRAVENOUS | Status: AC
  Administered 2020-12-19: 2 g via INTRAVENOUS
  Filled 2020-12-19: qty 50

## 2020-12-19 MED ORDER — CEPHALEXIN 500 MG PO CAPS: 500.0000 mg | ORAL_CAPSULE | Freq: Three times a day (TID) | ORAL | 0 refills | Status: DC

## 2020-12-19 MED ORDER — POTASSIUM CHLORIDE ER 10 MEQ PO TBCR: 10.0000 meq | EXTENDED_RELEASE_TABLET | Freq: Every day | ORAL | 0 refills | Status: DC

## 2020-12-19 MED ORDER — POTASSIUM CHLORIDE 10 MEQ/100ML IV SOLN
10.0000 meq | Freq: Once | INTRAVENOUS | Status: AC
  Administered 2020-12-19: 10 meq via INTRAVENOUS
  Filled 2020-12-19: qty 100

## 2020-12-19 NOTE — ED Triage Notes (Signed)
Pt arrives via [facility] after husband called d/t pt with fever, chills, urinary frequency and foul smelling urine. Pt has hx of frequent UTIs

## 2020-12-19 NOTE — ED Notes (Addendum)
Husband at bedside, states pt has not had any of her medications this date

## 2020-12-19 NOTE — ED Provider Notes (Signed)
[facility] EMERGENCY DEPARTMENT Provider Note   CSN: [id number] Arrival date & time: 12/19/20  0813     History Chief Complaint  Patient presents with   Urinary Frequency    [name] is a 86 y.o. female.   Urinary Frequency   This patient is an 86 year old female, unfortunately a level 5 caveat applies as the patient is not able to give me much in the way of information as she has had a prior stroke, she has had speech affected.  She lives with her husband.  She arrives by ambulance transport after it was noticed that she had foul-smelling urine fevers and chills this morning.  Paramedics noted temperature over 100 degrees, the patient is not able to give me any other information and the husband is not at the bedside.  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)    Patient Active Problem List   Diagnosis Date Noted   Schatzki's ring 05/08/2018   PUD (peptic ulcer disease) 05/08/2018   Duodenal ulcer 04/29/2018   Acute renal failure (ARF) ([place]) 04/26/2018   Dehydration 04/26/2018   Anemia, unspecified 04/26/2018   Altered mental state 04/26/2018   Leukocytosis 04/26/2018   Cerebrovascular disease 04/26/2018   Hypokalemia 04/26/2018   Painful orthopaedic hardware ([place]) 10/05/2014   Right hip pain 10/05/2014   Expressive aphasia    Acute blood loss anemia    Acute renal failure syndrome ([place])    Closed right hip fracture ([place]) 06/12/2014   Hip fracture ([place]) 06/12/2014   UTI (urinary tract infection) 06/12/2014   Fall    Vasovagal syncope 05/15/2011    History of CVA (cerebrovascular accident) 05/15/2011   Hypertension 05/15/2011   LOWER LEG, ARTHRITIS, DEGEN./OSTEO 01/21/2009   JOINT EFFUSION, RIGHT KNEE 01/21/2009   KNEE PAIN 01/21/2009    Past Surgical History:  Procedure Laterality Date   APPENDECTOMY     ESOPHAGEAL DILATION N/A 07/11/2018   Procedure: ESOPHAGEAL DILATION;  Surgeon: Rogene Houston, MD;  [facility];  Service: Endoscopy;  Laterality: N/A;   ESOPHAGOGASTRODUODENOSCOPY N/A 04/28/2018   Procedure: ESOPHAGOGASTRODUODENOSCOPY (EGD);  Surgeon: Rogene Houston, MD;  [facility];  Service: Endoscopy;  Laterality: N/A;   ESOPHAGOGASTRODUODENOSCOPY N/A 07/11/2018   Procedure: ESOPHAGOGASTRODUODENOSCOPY (EGD);  Surgeon: Rogene Houston, MD;  [facility];  Service: Endoscopy;  Laterality: N/A;  1200   HARDWARE REMOVAL Right 10/05/2014   Procedure: HARDWARE REMOVAL;  Surgeon: Dorna Leitz, MD;  Location: [place];  Service: Orthopedics;  Laterality: Right;   INTRAMEDULLARY (IM) NAIL INTERTROCHANTERIC Right 06/12/2014   Procedure: INTRAMEDULLARY (IM) NAIL INTERTROCHANTRIC RIGHT HIP;  Surgeon: Alta Corning, MD;  Location: [place];  Service: Orthopedics;  Laterality: Right;   kidney stones     LITHOTRIPSY     PEG PLACEMENT     and removal   PEG TUBE REMOVAL     RADIOLOGY WITH ANESTHESIA  04/22/2012   Procedure: RADIOLOGY WITH ANESTHESIA;  Surgeon: Rob Hickman,  MD;  Location: [place];  Service: Radiology;  Laterality: N/A;  VERTEBRAL ARTERY STENT PLACEMENT AND CEREBRAL ANGIOGRAM   TONSILLECTOMY     TYMPANOPLASTY       OB History   No obstetric history on file.     Family History  Problem Relation Age of Onset   CVA Father     Social History   Tobacco Use   Smoking status: Former    Types: Cigarettes    Quit date: 10/02/1983    Years since quitting: 37.2   Smokeless tobacco: Never  Vaping Use   Vaping Use: Never used  Substance Use Topics   Alcohol use: Not Currently    Comment:  occasional wine   Drug use: No    Home Medications Prior to Admission medications   Medication Sig Start Date End Date Taking? Authorizing Provider  cephALEXin (KEFLEX) 500 MG capsule Take 1 capsule (500 mg total) by mouth 3 (three) times daily for 7 days. 12/19/20 12/26/20 Yes Noemi Chapel, MD  potassium chloride (KLOR-CON) 10 MEQ tablet Take 1 tablet (10 mEq total) by mouth daily for 7 days. 12/19/20 12/26/20 Yes Noemi Chapel, MD  clopidogrel (PLAVIX) 75 MG tablet TAKE ONE TABLET BY MOUTH ONCE DAILY. 12/01/20   Susy Frizzle, MD  latanoprost (XALATAN) 0.005 % ophthalmic solution Place 1 drop into both eyes at bedtime. 04/11/18   [provider]  lisinopril-hydrochlorothiazide (ZESTORETIC) 20-12.5 MG tablet Take 1 tablet by mouth daily. 10/27/19   Corum, Rex Kras, MD  Multiple Vitamins-Minerals (CENTRUM ULTRA WOMENS) TABS Take 1 tablet by mouth daily.      [provider]  pantoprazole (PROTONIX) 40 MG tablet TAKE (1) TABLET BY MOUTH ONCE DAILY. 01/30/20   Rehman, Mechele Dawley, MD  rosuvastatin (CRESTOR) 20 MG tablet TAKE (1) TABLET BY MOUTH AT BEDTIME. 08/09/20   Susy Frizzle, MD  senna (SENOKOT) 8.6 MG tablet Take 1 tablet by mouth at bedtime.     [provider]    Allergies    Patient has no known allergies.  Review of Systems   Review of Systems  Unable to perform ROS: Patient nonverbal  Genitourinary:  Positive for frequency.   Physical Exam Updated Vital Signs BP (!) 163/75   Pulse 83   Temp 98.6 F (37 C) (Oral)   Resp 18   Ht 1.575 m (5\' 2" )   Wt 79.4 kg   SpO2 96%   BMI 32.01 kg/m   Physical Exam Vitals and nursing note reviewed.  Constitutional:      General: She is not in acute distress.    Appearance: She is well-developed.     Comments: Somnolent but arousable  HENT:     Head: Normocephalic and atraumatic.     Ears:     Comments: Hearing aid in the left ear    Nose: Nose normal. No congestion or rhinorrhea.     Mouth/Throat:      Pharynx: No oropharyngeal exudate or posterior oropharyngeal erythema.  Eyes:     General: No scleral icterus.       Right eye: No discharge.        Left eye: No discharge.     Conjunctiva/sclera: Conjunctivae normal.     Pupils: Pupils are equal, round, and reactive to light.  Neck:     Thyroid: No thyromegaly.     Vascular: No JVD.  Cardiovascular:     Rate and Rhythm: Normal rate and regular rhythm.     Heart sounds:  Normal heart sounds. No murmur heard.   No friction rub. No gallop.  Pulmonary:     Effort: Pulmonary effort is normal. No respiratory distress.     Breath sounds: Normal breath sounds. No wheezing or rales.  Abdominal:     General: Bowel sounds are normal. There is no distension.     Palpations: Abdomen is soft. There is no mass.     Tenderness: There is no abdominal tenderness.  Musculoskeletal:        General: No tenderness. Normal range of motion.     Cervical back: Normal range of motion and neck supple.     Right lower leg: No edema.     Left lower leg: No edema.  Lymphadenopathy:     Cervical: No cervical adenopathy.  Skin:    General: Skin is warm and dry.     Findings: No erythema or rash.  Neurological:     Coordination: Coordination normal.     Comments: The patient seems to be moving all 4 extremities but she does not follow commands, she will open her eyes and mumbles occasional words however she does appear somnolent  Psychiatric:        Behavior: Behavior normal.    ED Results / Procedures / Treatments   Labs (all labs ordered are listed, but only abnormal results are displayed) Labs Reviewed  COMPREHENSIVE METABOLIC PANEL - Abnormal; Notable for the following components:      Result Value   Potassium 2.8 (*)    Glucose, Bld 123 (*)    Creatinine, Ser 1.63 (*)    Calcium 8.4 (*)    Total Protein 6.3 (*)    Albumin 3.3 (*)    Total Bilirubin 1.3 (*)    GFR, Estimated 31 (*)    All other components within normal limits  CBC WITH  DIFFERENTIAL/PLATELET - Abnormal; Notable for the following components:   WBC 12.0 (*)    Neutro Abs 11.2 (*)    Lymphs Abs 0.3 (*)    All other components within normal limits  URINALYSIS, ROUTINE W REFLEX MICROSCOPIC - Abnormal; Notable for the following components:   APPearance HAZY (*)    Hgb urine dipstick MODERATE (*)    Protein, ur 100 (*)    Leukocytes,Ua LARGE (*)    RBC / HPF >50 (*)    Bacteria, UA FEW (*)    All other components within normal limits  RESP PANEL BY RT-PCR (FLU A&B, COVID) [id number]  CULTURE, BLOOD (ROUTINE X 2)  CULTURE, BLOOD (ROUTINE X 2)  URINE CULTURE  LACTIC ACID, PLASMA  LACTIC ACID, PLASMA  PROTIME-INR  APTT    EKG EKG Interpretation  Date/Time:  Sunday December 19 2020 08:44:32 EDT Ventricular Rate:  85 PR Interval:  169 QRS Duration: 97 QT Interval:  416 QTC Calculation: 495 R Axis:   48 Text Interpretation: Sinus rhythm Borderline prolonged QT interval ECG OTHERWISE WITHIN NORMAL LIMITS Confirmed by Noemi Chapel [phone]) on 12/19/2020 12:01:29 PM  Radiology DG Chest Port 1 View  Result Date: 12/19/2020 CLINICAL DATA:  Altered mental status EXAM: PORTABLE CHEST 1 VIEW COMPARISON:  11/17/2020 FINDINGS: Normal cardiac silhouette. Low lung volumes. Mild central venous congestion. Platelike atelectasis in the RIGHT upper lobe is unchanged. IMPRESSION: No interval change. Low lung volumes, central venous congestion and  atelectasis. Electronically Signed   By: Suzy Bouchard M.D.   On: 12/19/2020 09:39    Procedures Procedures   Medications Ordered in ED Medications  lactated ringers infusion (  Intravenous New Bag/Given 12/19/20 0900)  cefTRIAXone (ROCEPHIN) 1 g in sodium chloride 0.9 % 100 mL IVPB (0 g Intravenous Stopped 12/19/20 0941)  magnesium sulfate IVPB 2 g 50 mL (2 g Intravenous New Bag/Given 12/19/20 1054)  potassium chloride 10 mEq in 100 mL IVPB (10 mEq Intravenous New Bag/Given 12/19/20 1049)    ED Course  I have reviewed the  triage vital signs and the nursing notes.  Pertinent labs & imaging results that were available during my care of the patient were reviewed by me and considered in my medical decision making (see chart for details).  Clinical Course as of 12/19/20 1321  Sun Dec 19, 2020  1127 I have spoken with the patient's spouse who is arrived and states that she is at her baseline renal function.  She gets frequent urinary tract infections in fact this is the fourth infection this year.  He reports that she gets some urinary frequency back and forth to the bathroom but is usually refusing to go to the doctor.  At this time the patient is much more awake getting fluids potassium magnesium and antibiotics.  Very stable appearing and the husband states he is comfortable taking care of her at home. [BM]    Clinical Course User Index [BM] Noemi Chapel, MD   MDM Rules/Calculators/A&P                          Temperature of 100.7, she does not appear severely tachycardic or in distress.  She possibly has some altered mental status but it is unclear what her baseline is after stroke, husband is on his way.  Potassium given, antibiotics given, temperature has defervesced, heart rate is 83 patient is awake well-appearing and agreeable to discharge.  The patient's husband states he is comfortable taking care of her  Final Clinical Impression(s) / ED Diagnoses Final diagnoses:  Lower urinary tract infectious disease  Hypokalemia    Rx / DC Orders ED Discharge Orders          Ordered    potassium chloride (KLOR-CON) 10 MEQ tablet  Daily        12/19/20 1319    cephALEXin (KEFLEX) 500 MG capsule  3 times daily        12/19/20 1320             Noemi Chapel, MD 12/19/20 1321

## 2020-12-19 NOTE — Discharge Instructions (Addendum)
We have given you both an antibiotic for the infection as well as potassium supplementation for your low potassium.  Please take potassium once a day for 7 days as prescribed and take cephalexin as prescribed for the next 7 days as well.  You will need to follow-up with your doctor in 1 week for recheck of your potassium level.  If you should develop fevers or worsening symptoms please return to the emergency department immediately

## 2020-12-19 NOTE — ED Triage Notes (Signed)
[facility] from home. Was called saying that she is septic to come back to the er. Was here earlier today.

## 2020-12-19 NOTE — ED Notes (Signed)
Blood cultures and lactic collected at 08:50

## 2020-12-20 ENCOUNTER — Ambulatory Visit: Payer: PPO | Admitting: Family Medicine

## 2020-12-20 DIAGNOSIS — E782 Mixed hyperlipidemia: Secondary | ICD-10-CM | POA: Diagnosis not present

## 2020-12-20 DIAGNOSIS — Z79899 Other long term (current) drug therapy: Secondary | ICD-10-CM | POA: Diagnosis not present

## 2020-12-20 DIAGNOSIS — N390 Urinary tract infection, site not specified: Secondary | ICD-10-CM | POA: Diagnosis not present

## 2020-12-20 DIAGNOSIS — E876 Hypokalemia: Secondary | ICD-10-CM

## 2020-12-20 DIAGNOSIS — I10 Essential (primary) hypertension: Secondary | ICD-10-CM

## 2020-12-20 DIAGNOSIS — H409 Unspecified glaucoma: Secondary | ICD-10-CM | POA: Diagnosis present

## 2020-12-20 DIAGNOSIS — E669 Obesity, unspecified: Secondary | ICD-10-CM

## 2020-12-20 DIAGNOSIS — I1310 Hypertensive heart and chronic kidney disease without heart failure, with stage 1 through stage 4 chronic kidney disease, or unspecified chronic kidney disease: Secondary | ICD-10-CM | POA: Diagnosis not present

## 2020-12-20 DIAGNOSIS — N183 Chronic kidney disease, stage 3 unspecified: Secondary | ICD-10-CM

## 2020-12-20 DIAGNOSIS — N179 Acute kidney failure, unspecified: Secondary | ICD-10-CM | POA: Diagnosis not present

## 2020-12-20 DIAGNOSIS — N1832 Chronic kidney disease, stage 3b: Secondary | ICD-10-CM

## 2020-12-20 DIAGNOSIS — I679 Cerebrovascular disease, unspecified: Secondary | ICD-10-CM | POA: Diagnosis not present

## 2020-12-20 DIAGNOSIS — R7881 Bacteremia: Secondary | ICD-10-CM | POA: Diagnosis present

## 2020-12-20 DIAGNOSIS — Z823 Family history of stroke: Secondary | ICD-10-CM | POA: Diagnosis not present

## 2020-12-20 DIAGNOSIS — E46 Unspecified protein-calorie malnutrition: Secondary | ICD-10-CM

## 2020-12-20 DIAGNOSIS — E441 Mild protein-calorie malnutrition: Secondary | ICD-10-CM | POA: Diagnosis not present

## 2020-12-20 DIAGNOSIS — E785 Hyperlipidemia, unspecified: Secondary | ICD-10-CM

## 2020-12-20 DIAGNOSIS — K219 Gastro-esophageal reflux disease without esophagitis: Secondary | ICD-10-CM | POA: Diagnosis present

## 2020-12-20 DIAGNOSIS — R9431 Abnormal electrocardiogram [ECG] [EKG]: Secondary | ICD-10-CM

## 2020-12-20 DIAGNOSIS — D72829 Elevated white blood cell count, unspecified: Secondary | ICD-10-CM | POA: Diagnosis not present

## 2020-12-20 DIAGNOSIS — T502X5A Adverse effect of carbonic-anhydrase inhibitors, benzothiadiazides and other diuretics, initial encounter: Secondary | ICD-10-CM | POA: Diagnosis present

## 2020-12-20 DIAGNOSIS — H9190 Unspecified hearing loss, unspecified ear: Secondary | ICD-10-CM | POA: Diagnosis present

## 2020-12-20 DIAGNOSIS — E7800 Pure hypercholesterolemia, unspecified: Secondary | ICD-10-CM | POA: Diagnosis present

## 2020-12-20 DIAGNOSIS — M1710 Unilateral primary osteoarthritis, unspecified knee: Secondary | ICD-10-CM | POA: Diagnosis present

## 2020-12-20 DIAGNOSIS — Z6832 Body mass index (BMI) 32.0-32.9, adult: Secondary | ICD-10-CM | POA: Diagnosis not present

## 2020-12-20 DIAGNOSIS — A4151 Sepsis due to Escherichia coli [E. coli]: Secondary | ICD-10-CM | POA: Diagnosis not present

## 2020-12-20 DIAGNOSIS — Z8673 Personal history of transient ischemic attack (TIA), and cerebral infarction without residual deficits: Secondary | ICD-10-CM | POA: Diagnosis not present

## 2020-12-20 DIAGNOSIS — Z7902 Long term (current) use of antithrombotics/antiplatelets: Secondary | ICD-10-CM | POA: Diagnosis not present

## 2020-12-20 DIAGNOSIS — Z20822 Contact with and (suspected) exposure to covid-19: Secondary | ICD-10-CM | POA: Diagnosis not present

## 2020-12-20 DIAGNOSIS — J9600 Acute respiratory failure, unspecified whether with hypoxia or hypercapnia: Secondary | ICD-10-CM | POA: Diagnosis not present

## 2020-12-20 DIAGNOSIS — E8809 Other disorders of plasma-protein metabolism, not elsewhere classified: Secondary | ICD-10-CM

## 2020-12-20 DIAGNOSIS — Z974 Presence of external hearing-aid: Secondary | ICD-10-CM | POA: Diagnosis not present

## 2020-12-20 DIAGNOSIS — Z87891 Personal history of nicotine dependence: Secondary | ICD-10-CM | POA: Diagnosis not present

## 2020-12-20 DIAGNOSIS — R6520 Severe sepsis without septic shock: Secondary | ICD-10-CM | POA: Diagnosis not present

## 2020-12-20 LAB — CBC WITH DIFFERENTIAL/PLATELET
Abs Immature Granulocytes: 0.17 10*3/uL — ABNORMAL HIGH (ref 0.00–0.07)
Basophils Absolute: 0.1 10*3/uL (ref 0.0–0.1)
Basophils Relative: 1 %
Eosinophils Absolute: 0.1 10*3/uL (ref 0.0–0.5)
Eosinophils Relative: 0 %
HCT: 41.4 % (ref 36.0–46.0)
Hemoglobin: 12.8 g/dL (ref 12.0–15.0)
Immature Granulocytes: 1 %
Lymphocytes Relative: 6 %
Lymphs Abs: 1.2 10*3/uL (ref 0.7–4.0)
MCH: 26.8 pg (ref 26.0–34.0)
MCHC: 30.9 g/dL (ref 30.0–36.0)
MCV: 86.6 fL (ref 80.0–100.0)
Monocytes Absolute: 1.5 10*3/uL — ABNORMAL HIGH (ref 0.1–1.0)
Monocytes Relative: 8 %
Neutro Abs: 16.9 10*3/uL — ABNORMAL HIGH (ref 1.7–7.7)
Neutrophils Relative %: 84 %
Platelets: 184 10*3/uL (ref 150–400)
RBC: 4.78 MIL/uL (ref 3.87–5.11)
RDW: 13.8 % (ref 11.5–15.5)
WBC: 20 10*3/uL — ABNORMAL HIGH (ref 4.0–10.5)
nRBC: 0 % (ref 0.0–0.2)

## 2020-12-20 LAB — COMPREHENSIVE METABOLIC PANEL
ALT: 10 U/L (ref 0–44)
ALT: 12 U/L (ref 0–44)
AST: 17 U/L (ref 15–41)
AST: 18 U/L (ref 15–41)
Albumin: 2.7 g/dL — ABNORMAL LOW (ref 3.5–5.0)
Albumin: 3 g/dL — ABNORMAL LOW (ref 3.5–5.0)
Alkaline Phosphatase: 61 U/L (ref 38–126)
Alkaline Phosphatase: 70 U/L (ref 38–126)
Anion gap: 7 (ref 5–15)
Anion gap: 8 (ref 5–15)
BUN: 20 mg/dL (ref 8–23)
BUN: 22 mg/dL (ref 8–23)
CO2: 24 mmol/L (ref 22–32)
CO2: 27 mmol/L (ref 22–32)
Calcium: 8.1 mg/dL — ABNORMAL LOW (ref 8.9–10.3)
Calcium: 8.1 mg/dL — ABNORMAL LOW (ref 8.9–10.3)
Chloride: 107 mmol/L (ref 98–111)
Chloride: 108 mmol/L (ref 98–111)
Creatinine, Ser: 1.53 mg/dL — ABNORMAL HIGH (ref 0.44–1.00)
Creatinine, Ser: 1.69 mg/dL — ABNORMAL HIGH (ref 0.44–1.00)
GFR, Estimated: 30 mL/min — ABNORMAL LOW (ref >60)
GFR, Estimated: 34 mL/min — ABNORMAL LOW (ref >60)
Glucose, Bld: 123 mg/dL — ABNORMAL HIGH (ref 70–99)
Glucose, Bld: 84 mg/dL (ref 70–99)
Potassium: 3.2 mmol/L — ABNORMAL LOW (ref 3.5–5.1)
Potassium: 3.7 mmol/L (ref 3.5–5.1)
Sodium: 139 mmol/L (ref 135–145)
Sodium: 142 mmol/L (ref 135–145)
Total Bilirubin: 0.6 mg/dL (ref 0.3–1.2)
Total Bilirubin: 1 mg/dL (ref 0.3–1.2)
Total Protein: 5.3 g/dL — ABNORMAL LOW (ref 6.5–8.1)
Total Protein: 6 g/dL — ABNORMAL LOW (ref 6.5–8.1)

## 2020-12-20 LAB — BLOOD CULTURE ID PANEL (REFLEXED) - BCID2

## 2020-12-20 LAB — URINALYSIS, ROUTINE W REFLEX MICROSCOPIC
Bilirubin Urine: NEGATIVE
Glucose, UA: NEGATIVE mg/dL
Ketones, ur: NEGATIVE mg/dL
Nitrite: NEGATIVE
Protein, ur: 30 mg/dL — AB
RBC / HPF: >50 RBC/hpf — ABNORMAL HIGH (ref 0–5)
Specific Gravity, Urine: 1.011 (ref 1.005–1.030)
pH: 6 (ref 5.0–8.0)

## 2020-12-20 LAB — CBC
HCT: 37.7 % (ref 36.0–46.0)
Hemoglobin: 11.5 g/dL — ABNORMAL LOW (ref 12.0–15.0)
MCH: 26.7 pg (ref 26.0–34.0)
MCHC: 30.5 g/dL (ref 30.0–36.0)
MCV: 87.5 fL (ref 80.0–100.0)
Platelets: 161 10*3/uL (ref 150–400)
RBC: 4.31 MIL/uL (ref 3.87–5.11)
RDW: 13.8 % (ref 11.5–15.5)
WBC: 15.7 10*3/uL — ABNORMAL HIGH (ref 4.0–10.5)
nRBC: 0 % (ref 0.0–0.2)

## 2020-12-20 LAB — MAGNESIUM: Magnesium: 2.2 mg/dL (ref 1.7–2.4)

## 2020-12-20 LAB — PROTIME-INR
INR: 1.1 (ref 0.8–1.2)
INR: 1.2 (ref 0.8–1.2)
Prothrombin Time: 14.6 seconds (ref 11.4–15.2)
Prothrombin Time: 15.6 seconds — ABNORMAL HIGH (ref 11.4–15.2)

## 2020-12-20 LAB — LACTIC ACID, PLASMA
Lactic Acid, Venous: 1 mmol/L (ref 0.5–1.9)
Lactic Acid, Venous: 1.3 mmol/L (ref 0.5–1.9)

## 2020-12-20 LAB — PHOSPHORUS: Phosphorus: 2.5 mg/dL (ref 2.5–4.6)

## 2020-12-20 LAB — RESP PANEL BY RT-PCR (FLU A&B, COVID) ARPGX2
Influenza A by PCR: NEGATIVE
Influenza B by PCR: NEGATIVE
SARS Coronavirus 2 by RT PCR: NEGATIVE

## 2020-12-20 LAB — APTT
aPTT: 32 seconds (ref 24–36)
aPTT: 36 seconds (ref 24–36)

## 2020-12-20 MED ORDER — CARVEDILOL 12.5 MG PO TABS
12.5000 mg | ORAL_TABLET | Freq: Two times a day (BID) | ORAL | Status: DC
  Administered 2020-12-20 – 2020-12-22 (×4): 12.5 mg via ORAL
  Filled 2020-12-20 (×5): qty 1

## 2020-12-20 MED ORDER — CLOPIDOGREL BISULFATE 75 MG PO TABS
75.0000 mg | ORAL_TABLET | Freq: Every day | ORAL | Status: DC
  Administered 2020-12-20 – 2020-12-22 (×3): 75 mg via ORAL
  Filled 2020-12-20 (×3): qty 1

## 2020-12-20 MED ORDER — PANTOPRAZOLE SODIUM 40 MG PO TBEC
40.0000 mg | DELAYED_RELEASE_TABLET | Freq: Every day | ORAL | Status: DC
  Administered 2020-12-20 – 2020-12-22 (×3): 40 mg via ORAL
  Filled 2020-12-20 (×3): qty 1

## 2020-12-20 MED ORDER — DOXYCYCLINE HYCLATE 100 MG PO TABS
100.0000 mg | ORAL_TABLET | Freq: Two times a day (BID) | ORAL | Status: DC
  Administered 2020-12-20 – 2020-12-21 (×3): 100 mg via ORAL
  Filled 2020-12-20 (×3): qty 1

## 2020-12-20 MED ORDER — POTASSIUM CHLORIDE CRYS ER 20 MEQ PO TBCR
40.0000 meq | EXTENDED_RELEASE_TABLET | Freq: Once | ORAL | Status: AC
  Administered 2020-12-20: 40 meq via ORAL
  Filled 2020-12-20: qty 2

## 2020-12-20 MED ORDER — ENOXAPARIN SODIUM 30 MG/0.3ML IJ SOSY
30.0000 mg | PREFILLED_SYRINGE | INTRAMUSCULAR | Status: DC
  Administered 2020-12-20 – 2020-12-22 (×3): 30 mg via SUBCUTANEOUS
  Filled 2020-12-20 (×3): qty 0.3

## 2020-12-20 MED ORDER — CEFTRIAXONE SODIUM 2 G IJ SOLR: 2.0000 g | INTRAMUSCULAR | Status: DC

## 2020-12-20 MED ORDER — ROSUVASTATIN CALCIUM 20 MG PO TABS
20.0000 mg | ORAL_TABLET | Freq: Every evening | ORAL | Status: DC
  Administered 2020-12-20 – 2020-12-21 (×2): 20 mg via ORAL
  Filled 2020-12-20 (×2): qty 1

## 2020-12-20 MED ORDER — ENSURE ENLIVE PO LIQD
237.0000 mL | Freq: Two times a day (BID) | ORAL | Status: DC
  Administered 2020-12-20 – 2020-12-21 (×4): 237 mL via ORAL

## 2020-12-20 MED ORDER — SODIUM CHLORIDE 0.9 % IV SOLN
2.0000 g | INTRAVENOUS | Status: DC
  Administered 2020-12-20 – 2020-12-21 (×2): 2 g via INTRAVENOUS
  Filled 2020-12-20 (×3): qty 20

## 2020-12-20 MED ORDER — AMLODIPINE BESYLATE 5 MG PO TABS
5.0000 mg | ORAL_TABLET | Freq: Every day | ORAL | Status: DC
  Filled 2020-12-20: qty 1

## 2020-12-20 MED ORDER — LABETALOL HCL 5 MG/ML IV SOLN
10.0000 mg | INTRAVENOUS | Status: DC | PRN
  Administered 2020-12-20 – 2020-12-21 (×2): 10 mg via INTRAVENOUS
  Filled 2020-12-20 (×3): qty 4

## 2020-12-20 MED ORDER — AMLODIPINE BESYLATE 5 MG PO TABS
10.0000 mg | ORAL_TABLET | Freq: Every day | ORAL | Status: DC
  Administered 2020-12-20 – 2020-12-22 (×3): 10 mg via ORAL
  Filled 2020-12-20 (×3): qty 2

## 2020-12-20 MED ORDER — POTASSIUM CHLORIDE 10 MEQ/100ML IV SOLN
10.0000 meq | Freq: Once | INTRAVENOUS | Status: AC
  Administered 2020-12-20: 10 meq via INTRAVENOUS
  Filled 2020-12-20: qty 100

## 2020-12-20 NOTE — H&P (Signed)
History and Physical  [name]:[id number] DOB: [birth year] DOA: 12/19/2020  Referring physician: Merrily Pew, MD  PCP: Susy Frizzle, MD  Patient coming from: Home  Chief Complaint:  Increased urinary frequency  HPI: [name] is a 86 y.o. female with medical history significant for cerebrovascular disease s/p CVA in 2016, hypertension, hyperlipidemia, GERD, glaucoma who presents to the emergency department via EMS due to foul-smelling urine, fever and chills yesterday (7/17) in the morning.  Patient was unable to provide history, history was obtained from ED physician and ED medical record.  Per report, EMS was activated and on arrival of EMS they, patient's temperature was noted to be > 100F.  In the ED, urinalysis that was done showed large leukocytes and > 50 RBCs/hpf.  She was empirically treated with IV ceftriaxone, IV hydration of LR was, potassium was replenished and patient was discharged home pending urine culture.  Urine culture was reported to be positive for Enterobacter and E. coli, so patient was called to return to the ED for further management.  ED Course:  In the emergency department, she was hemodynamically stable.  BP was elevated at 163/71.  Work-up in the ED showed hypokalemia, BUN to creatinine 22/1.69 (creatinine within baseline range), lactic acid was negative. Chest x-ray showed mild patchy right upper lobe opacity, no well-visualized on recent prior, favoring mild infection/pneumonia.  She was treated with IV ceftriaxone, IV LR was provided and potassium was replenished.  Hospitalist was asked to admit patient for further evaluation and management.  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    Constipation    Degenerative joint disease of knee, right    right knee   Hearing impaired person     wears hearing aids   High cholesterol    History of blood transfusion    Hx: UTI (urinary tract infection)    Hypertension    Kidney stones    Stroke [facility])    speech is effected slightly , right sided weakness  (has complete obstruction left carotid - per husband)   Past Surgical History:  Procedure Laterality Date   APPENDECTOMY     ESOPHAGEAL DILATION N/A 07/11/2018   Procedure: ESOPHAGEAL DILATION;  Surgeon: Rogene Houston, MD;  [facility];  Service: Endoscopy;  Laterality: N/A;   ESOPHAGOGASTRODUODENOSCOPY N/A 04/28/2018   Procedure: ESOPHAGOGASTRODUODENOSCOPY (EGD);  Surgeon: Rogene Houston, MD;  [facility];  Service: Endoscopy;  Laterality: N/A;   ESOPHAGOGASTRODUODENOSCOPY N/A 07/11/2018   Procedure: ESOPHAGOGASTRODUODENOSCOPY (EGD);  Surgeon: Rogene Houston, MD;  [facility];  Service: Endoscopy;  Laterality: N/A;  1200   HARDWARE REMOVAL Right 10/05/2014   Procedure: HARDWARE REMOVAL;  Surgeon: Dorna Leitz, MD;  Location: [place];  Service: Orthopedics;  Laterality: Right;   INTRAMEDULLARY (IM) NAIL INTERTROCHANTERIC Right 06/12/2014   Procedure: INTRAMEDULLARY (IM) NAIL INTERTROCHANTRIC RIGHT HIP;  Surgeon: Alta Corning, MD;  Location: [place];  Service: Orthopedics;  Laterality: Right;   kidney stones     LITHOTRIPSY     PEG PLACEMENT     and removal   PEG TUBE REMOVAL     RADIOLOGY WITH ANESTHESIA  04/22/2012   Procedure: RADIOLOGY WITH ANESTHESIA;  Surgeon: Rob Hickman, MD;  Location: [place];  Service: Radiology;  Laterality: N/A;  VERTEBRAL ARTERY STENT PLACEMENT AND CEREBRAL  ANGIOGRAM   TONSILLECTOMY     TYMPANOPLASTY      Social History:  reports that she quit smoking about 37 years ago. Her smoking use included cigarettes. She has never used smokeless tobacco. She reports previous alcohol use. She reports that she does not use drugs.   No Known Allergies  Family History  Problem Relation Age of Onset   CVA Father       Prior to Admission medications   Medication Sig Start Date End Date Taking? Authorizing Provider  cephALEXin (KEFLEX) 500 MG capsule Take 1 capsule (500 mg total) by mouth 3 (three) times daily for 7 days. 12/19/20 12/26/20  Noemi Chapel, MD  clopidogrel (PLAVIX) 75 MG tablet TAKE ONE TABLET BY MOUTH ONCE DAILY. 12/01/20   Susy Frizzle, MD  latanoprost (XALATAN) 0.005 % ophthalmic solution Place 1 drop into both eyes at bedtime. 04/11/18   [provider]  lisinopril-hydrochlorothiazide (ZESTORETIC) 20-12.5 MG tablet Take 1 tablet by mouth daily. 10/27/19   Corum, Rex Kras, MD  Multiple Vitamins-Minerals (CENTRUM ULTRA WOMENS) TABS Take 1 tablet by mouth daily.      [provider]  pantoprazole (PROTONIX) 40 MG tablet TAKE (1) TABLET BY MOUTH ONCE DAILY. 01/30/20   Rehman, Mechele Dawley, MD  potassium chloride (KLOR-CON) 10 MEQ tablet Take 1 tablet (10 mEq total) by mouth daily for 7 days. 12/19/20 12/26/20  Noemi Chapel, MD  rosuvastatin (CRESTOR) 20 MG tablet TAKE (1) TABLET BY MOUTH AT BEDTIME. 08/09/20   Susy Frizzle, MD  senna (SENOKOT) 8.6 MG tablet Take 1 tablet by mouth at bedtime.     [provider]    Physical Exam: BP (!) 179/67 (BP Location: Left Arm)   Pulse 73   Temp 98.3 F (36.8 C) (Oral)   Resp 20   Ht 5\' 2"  (1.575 m)   Wt 76.3 kg   SpO2 99%   BMI 30.77 kg/m   General: 86 y.o. year-old female well developed well nourished, somnolent, though easily arousable and in no acute distress.  Alert and oriented x3. HEENT: NCAT, EOMI, hard of hearing Neck: Supple, trachea medial Cardiovascular: Regular rate and rhythm with no rubs or gallops.  No thyromegaly or JVD noted.  No lower extremity edema. 2/4 pulses in all 4 extremities. Respiratory: Clear to auscultation with no wheezes or rales. Good inspiratory effort. Abdomen: Soft, nontender nondistended with normal bowel sounds x4 quadrants. Muskuloskeletal: No cyanosis, clubbing or edema noted  bilaterally Neuro: Sensation, reflexes intact, normal movement of extremities.  No focal neurologic deficits Skin: No ulcerative lesions noted or rashes Psychiatry: Mood is appropriate for condition and setting          Labs on Admission:  Basic Metabolic Panel: Recent Labs  Lab 12/19/20 0855 12/20/20 0011  NA 141 139  K 2.8* 3.2*  CL 108 108  CO2 25 24  GLUCOSE 123* 123*  BUN 21 22  CREATININE 1.63* 1.69*  CALCIUM 8.4* 8.1*   Liver Function Tests: Recent Labs  Lab 12/19/20 0855 12/20/20 0011  AST 21 18  ALT 12 12  ALKPHOS 80 70  BILITOT 1.3* 1.0  PROT 6.3* 6.0*  ALBUMIN 3.3* 3.0*   No results for input(s): LIPASE, AMYLASE in the last 168 hours. No results for input(s): AMMONIA in the last 168 hours. CBC: Recent Labs  Lab 12/19/20 0855 12/20/20 0011  WBC 12.0* 20.0*  NEUTROABS 11.2* 16.9*  HGB 13.5 12.8  HCT 42.8 41.4  MCV 86.3 86.6  PLT 190  184   Cardiac Enzymes: No results for input(s): CKTOTAL, CKMB, CKMBINDEX, TROPONINI in the last 168 hours.  BNP (last 3 results) No results for input(s): BNP in the last 8760 hours.  ProBNP (last 3 results) No results for input(s): PROBNP in the last 8760 hours.  CBG: No results for input(s): GLUCAP in the last 168 hours.  Radiological Exams on Admission: DG Chest Port 1 View  Result Date: 12/20/2020 CLINICAL DATA:  Fever, chills EXAM: PORTABLE CHEST 1 VIEW COMPARISON:  05/21/2021 FINDINGS: Mild patchy right upper lobe opacity, not well visualized on recent prior, favoring mild infection/pneumonia. Increased interstitial markings. No frank interstitial edema. No definite pleural effusions. No pneumothorax. Cardiomegaly. IMPRESSION: Mild patchy right upper lobe opacity, not well visualized on recent prior, favoring mild infection/pneumonia. Follow-up chest radiographs are suggested to document clearance. Electronically Signed   By: Julian Hy M.D.   On: 12/20/2020 00:38   DG Chest Port 1 View  Result Date:  12/19/2020 CLINICAL DATA:  Altered mental status EXAM: PORTABLE CHEST 1 VIEW COMPARISON:  11/17/2020 FINDINGS: Normal cardiac silhouette. Low lung volumes. Mild central venous congestion. Platelike atelectasis in the RIGHT upper lobe is unchanged. IMPRESSION: No interval change. Low lung volumes, central venous congestion and  atelectasis. Electronically Signed   By: Suzy Bouchard M.D.   On: 12/19/2020 09:39    EKG: I independently viewed the EKG done and my findings are as followed: NSR at a rate of 85bpm and Qtc of 451ms   Assessment/Plan Present on Admission:  UTI (urinary tract infection)  Hypokalemia  Cerebrovascular disease  Hypertension  Leukocytosis  Principal Problem:   UTI (urinary tract infection) Active Problems:   Hypertension   Leukocytosis   Cerebrovascular disease   Hypokalemia   Hypoalbuminemia due to protein-calorie malnutrition (HCC)   Obesity (BMI 30.0-34.9)   CKD (chronic kidney disease), stage III (HCC)   Prolonged QT interval   Hyperlipidemia  UTI POA Urine culture done on 7/17 was positive for Enterobacter and E. coli Patient was started on IV ceftriaxone, we shall continue with same at this time Continue Tylenol 650 mg every 6 hours as needed for fever  Leukocytosis possible secondary to above WBC 20: Continue treatment as described above  Hypokalemia K+ is 3.2 K+ will be replenished Please monitor for AM K+ for further replenishmemnt  Hypoalbuminemia possibly secondary to mild protein calorie malnutrition Protein supplement will be provided  Borderline prolonged QTc (495 ms) Avoid QT prolonging drugs Magnesium level will be checked Repeat EKG in the morning  Hypertension Continue amlodipine  Hyperlipidemia Continue Crestor  CVA Continue Crestor, Plavix  GERD Continue Protonix  Obesity (BMI 30.77) Patient will be counseled on diet and lifestyle modification when more stable   DVT prophylaxis: Lovenox  Code Status: Full  code  Family Communication: None at bedside  Disposition Plan:  Patient is from:                        home Anticipated DC to:                   SNF or family members home Anticipated DC date:               2-3 days Anticipated DC barriers:          Patient requires inpatient management due to UTI POA with positive urine culture     Consults called: None  Admission status: Inpatient    Bernadette Hoit MD [facility]  12/20/2020, 4:00 AM

## 2020-12-20 NOTE — Progress Notes (Signed)
Patient seen and evaluated, chart reviewed, please see EMR for updated orders. Please see full H&P dictated by admitting physician Dr Josephine Cables for same date of service.    Brief Summary:_ 86 y.o. female with medical history significant for cerebrovascular disease s/p CVA in 2016, hypertension, hyperlipidemia, GERD, glaucoma admitted with E. coli sepsis secondary to presumed urinary source   -Patient is a lot more awake, talkative, oriented, eating and drinking okay, husband at bedside  A/p 1) E. coli sepsis secondary to presumed urinary source----POA -patient met sepsis criteria on admission with leukocytosis (wbc 20.0) , fevers (101.7)  and tachypnea (RR 30) -Lactic acid was 1.3 -Continue IV Rocephin 2 g daily pending sensitivity of E. coli in the blood, urine culture pending -Continue LR/ivf  2) hypokalemia--most likely due to HCTZ use, replace potassium and hold HCTZ  3) history of prior CVA--- okay to continue clopidogrel and Crestor  4)HTN-hold lisinopril HCTZ due to hypokalemia and UTI with AKI on CKD concerns -Okay to use amlodipine and Coreg may use IV labetalol when necessary  Every 4 hours for systolic blood pressure over 160 mmhg  5) generalized weakness/deconditioning----PT eval requested  6) CKD stage - 3B -Renal function is close to baseline, hold lisinopril HCTZ given sepsis and UTI --renally adjust medications, avoid nephrotoxic agents / dehydration  / hypotension -IV fluids as ordered  --Plan of care discussed with patient and her husband at bedside, questions answered --Total care time of 47 minutes  Patient seen and evaluated, chart reviewed, please see EMR for updated orders. Please see full H&P dictated by admitting physician Dr Josephine Cables for same date of service.   Roxan Hockey, MD

## 2020-12-20 NOTE — ED Provider Notes (Signed)
[facility] Provider Note   CSN: [id number] Arrival date & time: 12/19/20  2341     History Chief Complaint  Patient presents with   Blood Infection    [name] is a 86 y.o. female.  86 year old female that was seen in ER earlier today.  Patient was seen at that time for weakness fever and chills that has been going on for about a day.  She is found to have urinary tract infection hypokalemia.  She was started on antibiotics, cultures were drawn.  Husband took her home and states she did seem to feel a bit more weak than normal but he thought was related to being tired.  Apparently her cultures came back positive for gram-negative rods and recalled told to come back to the ER.  No other significant changes from the earlier visit.  No longer febrile.         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    Constipation    Degenerative joint disease of knee, right    right knee   Hearing impaired person    wears hearing aids   High cholesterol    History of blood transfusion    Hx: UTI (urinary tract infection)    Hypertension    Kidney stones    Stroke [facility])    speech is effected slightly , right sided weakness  (has complete obstruction left carotid - per husband)    Patient Active Problem List   Diagnosis Date Noted   Hypoalbuminemia due to protein-calorie malnutrition ([name]) 12/20/2020   Obesity (BMI 30.0-34.9) 12/20/2020   CKD (chronic kidney disease), stage III ([place]) 12/20/2020   Prolonged QT interval 12/20/2020   Hyperlipidemia 12/20/2020   Schatzki's ring 05/08/2018   PUD (peptic ulcer disease) 05/08/2018   Duodenal ulcer 04/29/2018   Acute renal failure (ARF) ([place]) 04/26/2018   Dehydration 04/26/2018   Anemia, unspecified 04/26/2018   Altered mental state 04/26/2018   Leukocytosis 04/26/2018   Cerebrovascular disease 04/26/2018   Hypokalemia 04/26/2018   Painful orthopaedic hardware ([place])  10/05/2014   Right hip pain 10/05/2014   Expressive aphasia    Acute blood loss anemia    Acute renal failure syndrome ([place])    Closed right hip fracture ([place]) 06/12/2014   Hip fracture ([place]) 06/12/2014   UTI (urinary tract infection) 06/12/2014   Fall    Vasovagal syncope 05/15/2011   History of CVA (cerebrovascular accident) 05/15/2011   Hypertension 05/15/2011   LOWER LEG, ARTHRITIS, DEGEN./OSTEO 01/21/2009   JOINT EFFUSION, RIGHT KNEE 01/21/2009   KNEE PAIN 01/21/2009    Past Surgical History:  Procedure Laterality Date   APPENDECTOMY     ESOPHAGEAL DILATION N/A 07/11/2018   Procedure: ESOPHAGEAL DILATION;  Surgeon: Rogene Houston, MD;  [facility];  Service: Endoscopy;  Laterality: N/A;   ESOPHAGOGASTRODUODENOSCOPY N/A 04/28/2018   Procedure: ESOPHAGOGASTRODUODENOSCOPY (EGD);  Surgeon: Rogene Houston, MD;  [facility];  Service: Endoscopy;  Laterality: N/A;   ESOPHAGOGASTRODUODENOSCOPY N/A 07/11/2018   Procedure: ESOPHAGOGASTRODUODENOSCOPY (EGD);  Surgeon: Rogene Houston, MD;  [facility];  Service: Endoscopy;  Laterality: N/A;  1200   HARDWARE REMOVAL Right 10/05/2014   Procedure: HARDWARE REMOVAL;  Surgeon: Dorna Leitz, MD;  Location: [place];  Service: Orthopedics;  Laterality: Right;   INTRAMEDULLARY (IM) NAIL INTERTROCHANTERIC Right 06/12/2014   Procedure: INTRAMEDULLARY (IM) NAIL INTERTROCHANTRIC RIGHT HIP;  Surgeon: Alta Corning, MD;  Location:  [place] [facility];  Service: Orthopedics;  Laterality: Right;   kidney stones     LITHOTRIPSY     PEG PLACEMENT     and removal   PEG TUBE REMOVAL     RADIOLOGY WITH ANESTHESIA  04/22/2012   Procedure: RADIOLOGY WITH ANESTHESIA;  Surgeon: Rob Hickman, MD;  Location: [place];  Service: Radiology;  Laterality: N/A;  VERTEBRAL ARTERY STENT PLACEMENT AND CEREBRAL ANGIOGRAM   TONSILLECTOMY     TYMPANOPLASTY       OB History   No obstetric history on file.     Family History  Problem Relation Age  of Onset   CVA Father     Social History   Tobacco Use   Smoking status: Former    Types: Cigarettes    Quit date: 10/02/1983    Years since quitting: 37.2   Smokeless tobacco: Never  Vaping Use   Vaping Use: Never used  Substance Use Topics   Alcohol use: Not Currently    Comment: occasional wine   Drug use: No    Home Medications Prior to Admission medications   Medication Sig Start Date End Date Taking? Authorizing Provider  cephALEXin (KEFLEX) 500 MG capsule Take 1 capsule (500 mg total) by mouth 3 (three) times daily for 7 days. 12/19/20 12/26/20  Noemi Chapel, MD  clopidogrel (PLAVIX) 75 MG tablet TAKE ONE TABLET BY MOUTH ONCE DAILY. 12/01/20   Susy Frizzle, MD  latanoprost (XALATAN) 0.005 % ophthalmic solution Place 1 drop into both eyes at bedtime. 04/11/18   [provider]  lisinopril-hydrochlorothiazide (ZESTORETIC) 20-12.5 MG tablet Take 1 tablet by mouth daily. 10/27/19   Corum, Rex Kras, MD  Multiple Vitamins-Minerals (CENTRUM ULTRA WOMENS) TABS Take 1 tablet by mouth daily.      [provider]  pantoprazole (PROTONIX) 40 MG tablet TAKE (1) TABLET BY MOUTH ONCE DAILY. 01/30/20   Rehman, Mechele Dawley, MD  potassium chloride (KLOR-CON) 10 MEQ tablet Take 1 tablet (10 mEq total) by mouth daily for 7 days. 12/19/20 12/26/20  Noemi Chapel, MD  rosuvastatin (CRESTOR) 20 MG tablet TAKE (1) TABLET BY MOUTH AT BEDTIME. 08/09/20   Susy Frizzle, MD  senna (SENOKOT) 8.6 MG tablet Take 1 tablet by mouth at bedtime.     [provider]    Allergies    Patient has no known allergies.  Review of Systems   Review of Systems  All other systems reviewed and are negative.  Physical Exam Updated Vital Signs BP (!) 179/67 (BP Location: Left Arm)   Pulse 73   Temp 98.3 F (36.8 C) (Oral)   Resp 20   Ht 5\' 2"  (1.575 m)   Wt 76.3 kg   SpO2 99%   BMI 30.77 kg/m   Physical Exam Vitals and nursing note reviewed.  Constitutional:      Appearance: She  is well-developed.  HENT:     Head: Normocephalic and atraumatic.     Mouth/Throat:     Mouth: Mucous membranes are moist.     Pharynx: Oropharynx is clear.  Eyes:     Pupils: Pupils are equal, round, and reactive to light.  Cardiovascular:     Rate and Rhythm: Normal rate and regular rhythm.  Pulmonary:     Effort: No respiratory distress.     Breath sounds: No stridor.  Abdominal:     General: Abdomen is flat. There is no distension.  Musculoskeletal:        General: No swelling or  tenderness. Normal range of motion.     Cervical back: Normal range of motion.  Skin:    General: Skin is warm and dry.  Neurological:     Mental Status: She is alert. Mental status is at baseline.    ED Results / Procedures / Treatments   Labs (all labs ordered are listed, but only abnormal results are displayed) Labs Reviewed  COMPREHENSIVE METABOLIC PANEL - Abnormal; Notable for the following components:      Result Value   Potassium 3.2 (*)    Glucose, Bld 123 (*)    Creatinine, Ser 1.69 (*)    Calcium 8.1 (*)    Total Protein 6.0 (*)    Albumin 3.0 (*)    GFR, Estimated 30 (*)    All other components within normal limits  CBC WITH DIFFERENTIAL/PLATELET - Abnormal; Notable for the following components:   WBC 20.0 (*)    Neutro Abs 16.9 (*)    Monocytes Absolute 1.5 (*)    Abs Immature Granulocytes 0.17 (*)    All other components within normal limits  URINALYSIS, ROUTINE W REFLEX MICROSCOPIC - Abnormal; Notable for the following components:   APPearance CLOUDY (*)    Hgb urine dipstick LARGE (*)    Protein, ur 30 (*)    Leukocytes,Ua LARGE (*)    RBC / HPF >50 (*)    Bacteria, UA RARE (*)    All other components within normal limits  RESP PANEL BY RT-PCR (FLU A&B, COVID) ARPGX2  CULTURE, BLOOD (ROUTINE X 2)  CULTURE, BLOOD (ROUTINE X 2)  URINE CULTURE  LACTIC ACID, PLASMA  LACTIC ACID, PLASMA  PROTIME-INR  APTT  COMPREHENSIVE METABOLIC PANEL  CBC  PROTIME-INR  APTT   MAGNESIUM  PHOSPHORUS    EKG None  Radiology DG Chest Port 1 View  Result Date: 12/20/2020 CLINICAL DATA:  Fever, chills EXAM: PORTABLE CHEST 1 VIEW COMPARISON:  05/21/2021 FINDINGS: Mild patchy right upper lobe opacity, not well visualized on recent prior, favoring mild infection/pneumonia. Increased interstitial markings. No frank interstitial edema. No definite pleural effusions. No pneumothorax. Cardiomegaly. IMPRESSION: Mild patchy right upper lobe opacity, not well visualized on recent prior, favoring mild infection/pneumonia. Follow-up chest radiographs are suggested to document clearance. Electronically Signed   By: Julian Hy M.D.   On: 12/20/2020 00:38   DG Chest Port 1 View  Result Date: 12/19/2020 CLINICAL DATA:  Altered mental status EXAM: PORTABLE CHEST 1 VIEW COMPARISON:  11/17/2020 FINDINGS: Normal cardiac silhouette. Low lung volumes. Mild central venous congestion. Platelike atelectasis in the RIGHT upper lobe is unchanged. IMPRESSION: No interval change. Low lung volumes, central venous congestion and  atelectasis. Electronically Signed   By: Suzy Bouchard M.D.   On: 12/19/2020 09:39    Procedures .Critical Care  Date/Time: 12/20/2020 4:39 AM Performed by: Merrily Pew, MD Authorized by: Merrily Pew, MD   Critical care provider statement:    Critical care time (minutes):  45   Critical care was necessary to treat or prevent imminent or life-threatening deterioration of the following conditions:  Sepsis   Critical care was time spent personally by me on the following activities:  Discussions with consultants, evaluation of patient's response to treatment, examination of patient, ordering and performing treatments and interventions, ordering and review of laboratory studies, ordering and review of radiographic studies, pulse oximetry, re-evaluation of patient's condition, obtaining history from patient or surrogate and review of old charts   Medications  Ordered in ED Medications  lactated ringers infusion (  Intravenous Transfusing/Transfer 12/20/20 0226)  cefTRIAXone (ROCEPHIN) 1 g in sodium chloride 0.9 % 100 mL IVPB (0 g Intravenous Stopped 12/20/20 0106)  enoxaparin (LOVENOX) injection 30 mg (has no administration in time range)  feeding supplement (ENSURE ENLIVE / ENSURE PLUS) liquid 237 mL (has no administration in time range)  amLODipine (NORVASC) tablet 5 mg (has no administration in time range)  lactated ringers bolus 1,000 mL (0 mLs Intravenous Stopped 12/20/20 0145)    And  lactated ringers bolus 500 mL (0 mLs Intravenous Stopped 12/20/20 0207)  potassium chloride 10 mEq in 100 mL IVPB (0 mEq Intravenous Stopped 12/20/20 0211)  potassium chloride SA (KLOR-CON) CR tablet 40 mEq (40 mEq Oral Given 12/20/20 6384)    ED Course  I have reviewed the triage vital signs and the nursing notes.  Pertinent labs & imaging results that were available during my care of the patient were reviewed by me and considered in my medical decision making (see chart for details).    MDM Rules/Calculators/A&P                          Code sepsis initiated on arrival.  Had Rocephin earlier today we will give another dose now.  After lactic acid came back that was not abnormal and she is actually hypertensive not hypotensive I only gave 1500cc of fluid rather than the prescribed 2500 for septic shock.  Discussed with hospitalist who will admit. Husband UTD on plan.  Final Clinical Impression(s) / ED Diagnoses Final diagnoses:  Bacteremia    Rx / DC Orders ED Discharge Orders     None        Shaunessy Dobratz, Corene Cornea, MD 12/20/20 [phone]

## 2020-12-20 NOTE — Progress Notes (Signed)
Pt being followed by [facility] for sepsis protocol. 

## 2020-12-20 NOTE — Progress Notes (Signed)
Patient's BP was 192/83 and hr of 86. MD Emokpae was notified. Patient was given labetalol IV.

## 2020-12-20 NOTE — Progress Notes (Signed)
MD Denton Brick is aware that patient's BP is still elevated.

## 2020-12-21 DIAGNOSIS — A4151 Sepsis due to Escherichia coli [E. coli]: Secondary | ICD-10-CM | POA: Diagnosis present

## 2020-12-21 LAB — CBC
HCT: 35.5 % — ABNORMAL LOW (ref 36.0–46.0)
Hemoglobin: 11 g/dL — ABNORMAL LOW (ref 12.0–15.0)
MCH: 26.8 pg (ref 26.0–34.0)
MCHC: 31 g/dL (ref 30.0–36.0)
MCV: 86.6 fL (ref 80.0–100.0)
Platelets: 169 10*3/uL (ref 150–400)
RBC: 4.1 MIL/uL (ref 3.87–5.11)
RDW: 13.8 % (ref 11.5–15.5)
WBC: 11.1 10*3/uL — ABNORMAL HIGH (ref 4.0–10.5)
nRBC: 0 % (ref 0.0–0.2)

## 2020-12-21 LAB — BASIC METABOLIC PANEL
Anion gap: 9 (ref 5–15)
BUN: 17 mg/dL (ref 8–23)
CO2: 25 mmol/L (ref 22–32)
Calcium: 8 mg/dL — ABNORMAL LOW (ref 8.9–10.3)
Chloride: 108 mmol/L (ref 98–111)
Creatinine, Ser: 1.46 mg/dL — ABNORMAL HIGH (ref 0.44–1.00)
GFR, Estimated: 35 mL/min — ABNORMAL LOW (ref >60)
Glucose, Bld: 103 mg/dL — ABNORMAL HIGH (ref 70–99)
Potassium: 3.7 mmol/L (ref 3.5–5.1)
Sodium: 142 mmol/L (ref 135–145)

## 2020-12-21 NOTE — Plan of Care (Signed)
  Problem: Acute Rehab PT Goals(only PT should resolve) Goal: Pt Will Go Supine/Side To Sit Outcome: Progressing Flowsheets (Taken 12/21/2020 1314) Pt will go Supine/Side to Sit: with supervision Goal: Pt Will Go Sit To Supine/Side Outcome: Progressing Flowsheets (Taken 12/21/2020 1314) Pt will go Sit to Supine/Side: with modified independence Goal: Patient Will Transfer Sit To/From Stand Outcome: Progressing Flowsheets (Taken 12/21/2020 1314) Patient will transfer sit to/from stand: with modified independence Goal: Pt Will Ambulate Outcome: Progressing Flowsheets (Taken 12/21/2020 1314) Pt will Ambulate:  75 feet  with supervision  with rolling walker Goal: Pt/caregiver will Perform Home Exercise Program Outcome: Progressing Flowsheets (Taken 12/21/2020 1314) Pt/caregiver will Perform Home Exercise Program:  For increased ROM  For increased strengthening  For improved balance  With Supervision, verbal cues required/provided   Talbot Grumbling PT, DPT 12/21/20, 1:14 PM

## 2020-12-21 NOTE — Evaluation (Signed)
Physical Therapy Evaluation Patient Details Name: [name] MRN: [id number] DOB: 25-May-1935 Today's Date: 12/21/2020   History of Present Illness  86 y.o. female presents with foul-smelling urine, fever and chills complaints; pt admitted with E. coli sepsis secondary to presumed urinary source. PMH: cerebrovascular disease s/p CVA in 2016, hypertension, hyperlipidemia, GERD, glaucoma   Clinical Impression  Pt admitted with above diagnosis. Pt using rollator at baseline with spouse providing supv/mod ind to mobilize around the home and using w/c for community distances. Pt currently requiring min guard using RW for 30 ft, limited by fatigue. 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 Spouse and pt in agreement with HHPT to progress pt back to baseline and reduce risk for falls. Pt currently with functional limitations due to the deficits listed below (see PT Problem List). Pt will benefit from skilled PT to increase their independence and safety with mobility to allow discharge to the venue listed below.       Follow Up Recommendations Home health PT;Supervision/Assistance - 24 hour    Equipment Recommendations  None recommended by PT    Recommendations for Other Services       Precautions / Restrictions Precautions Precautions: Fall Restrictions Weight Bearing Restrictions: No      Mobility  Bed Mobility  General bed mobility comments: pt OOB upon arrival    Transfers Overall transfer level: Needs assistance Equipment used: Rolling walker (2 wheeled) Transfers: Sit to/from [employer] Transfers Sit to Stand: Supervision Stand pivot transfers: Supervision    General transfer comment: VCs for hand placement to power to stand and for controlled lowering back to sitting  Ambulation/Gait Ambulation/Gait assistance: Min guard Gait Distance (Feet): 30 Feet Assistive device: Rolling walker (2 wheeled) Gait Pattern/deviations: Step-to  pattern;Decreased stride length Gait velocity: decreased   General Gait Details: slow, step to pattern, slight R trendelenburg that spouse reports is from old orthopedic surgery, no LOB, limited by fatigue  Stairs            Wheelchair Mobility    Modified Rankin (Stroke Patients Only)       Balance Overall balance assessment: Mild deficits observed, not formally tested       Pertinent Vitals/Pain Pain Assessment: No/denies pain    Home Living Family/patient expects to be discharged to:: Private residence Living Arrangements: Spouse/significant other Available Help at Discharge: Family;Available 24 hours/day Type of Home: House Home Access: Stairs to enter;Ramped entrance Entrance Stairs-Rails: Right;Left;Can reach both Entrance Stairs-Number of Steps: 7 Home Layout: Two level;Able to live on main level with bedroom/bathroom Home Equipment: Walker - 4 wheels;Bedside commode;Shower seat - built in;Wheelchair - manual      Prior Function Level of Independence: Independent with assistive device(s);Needs assistance   Gait / Transfers Assistance Needed: spouse reports pt using rollator walker in the home with supv/mod independent, uses w/c for community distances  ADL's / Homemaking Assistance Needed: spouse reports occasional assist with toileting, dressing; have cleaning lady and eat out for meals        Hand Dominance        Extremity/Trunk Assessment   Upper Extremity Assessment Upper Extremity Assessment: Overall WFL for tasks assessed    Lower Extremity Assessment Lower Extremity Assessment: Generalized weakness (AROM WNL, strength grossly 4-/5)    Cervical / Trunk Assessment Cervical / Trunk Assessment: Normal  Communication   Communication: HOH;Expressive difficulties  Cognition Arousal/Alertness: Awake/alert Behavior During Therapy: WFL for tasks assessed/performed Overall Cognitive Status: Within Functional Limits for tasks assessed  General  Comments: Pt with difficulty stating name and answering questions, spouse states that is baseline. Pt very pleasant and engages with therapist.      General Comments      Exercises     Assessment/Plan    PT Assessment Patient needs continued PT services  PT Problem List Decreased strength;Decreased activity tolerance;Decreased balance       PT Treatment Interventions DME instruction;Gait training;Stair training;Functional mobility training;Therapeutic activities;Therapeutic exercise;Balance training;Patient/family education    PT Goals (Current goals can be found in the Care Plan section)  Acute Rehab PT Goals Patient Stated Goal: home with HHPT PT Goal Formulation: With patient/family Time For Goal Achievement: 01/04/21 Potential to Achieve Goals: Good    Frequency Min 3X/week   Barriers to discharge        Co-evaluation               AM-PAC PT "6 Clicks" Mobility  Outcome Measure Help needed turning from your back to your side while in a flat bed without using bedrails?: A Little Help needed moving from lying on your back to sitting on the side of a flat bed without using bedrails?: A Little Help needed moving to and from a bed to a chair (including a wheelchair)?: A Little Help needed standing up from a chair using your arms (e.g., wheelchair or bedside chair)?: A Little Help needed to walk in hospital room?: A Little Help needed climbing 3-5 steps with a railing? : A Little 6 Click Score: 18    End of Session Equipment Utilized During Treatment: Gait belt Activity Tolerance: Patient tolerated treatment well Patient left: in chair;with call bell/phone within reach;with chair alarm set;with family/visitor present Nurse Communication: Mobility status PT Visit Diagnosis: Other abnormalities of gait and mobility (R26.89);Muscle weakness (generalized) (M62.81)    Time: 7078-6754 PT Time Calculation (min) (ACUTE ONLY): 30 min   Charges:   PT Evaluation $PT  Eval Low Complexity: 1 Low PT Treatments $Therapeutic Activity: 8-22 mins         Tori Shaka Zech PT, DPT 12/21/20, 1:09 PM

## 2020-12-21 NOTE — Progress Notes (Addendum)
Patient Demographics:    [name], is a 86 y.o. female, DOB - 06/30/1934, RJJ:[id number]  Admit date - 12/19/2020   Admitting Physician Bernadette Hoit, DO  Outpatient Primary MD for the patient is Pickard, Cammie Mcgee, MD  LOS - 1   Chief Complaint  Patient presents with   Blood Infection        Subjective:    [name] today has no fevers, no emesis,  No chest pain, C/o fatigue and weakness  - Husband is at bedside  Assessment  & Plan :    Principal Problem:   Sepsis due to Escherichia coli (E. coli) -from urinary source--POA Active Problems:   Hypertension   UTI (urinary tract infection)   Leukocytosis   Cerebrovascular disease   Hypokalemia   Hypoalbuminemia due to protein-calorie malnutrition (HCC)   Obesity (BMI 30.0-34.9)   CKD (chronic kidney disease), stage III (HCC)   Prolonged QT interval   Hyperlipidemia  Brief Summary:_ 86 y.o. female with medical history significant for cerebrovascular disease s/p CVA in 2016, hypertension, hyperlipidemia, GERD, glaucoma admitted on 12/20/2020 with E. coli sepsis secondary to presumed urinary source   A/p 1) E. coli sepsis secondary to presumed urinary source----POA -patient met sepsis criteria on admission with leukocytosis (wbc 20.0) , fevers (101.7)  and tachypnea (RR 30) -Lactic acid was 1.3 -Blood cultures from 12/19/2020 and urine culture from 12/20/2020 growing E. coli-sensitivities pending -Continue IV Rocephin 2 g daily pending sensitivity of E. coli   2) hypokalemia--most likely due to HCTZ use, replaced potassium and hold HCTZ  3) history of prior CVA--- okay to continue clopidogrel and Crestor  4)HTN-hold lisinopril HCTZ due to hypokalemia and UTI with AKI on CKD concerns -Okay to use amlodipine and Coreg for BP control may use IV labetalol when necessary  Every 4 hours for systolic blood pressure over 160  mmhg  5) generalized weakness/deconditioning----PT eval appreciated, recommends home health PT  6) CKD stage - 3B -Renal function is close to baseline, hold lisinopril HCTZ given sepsis and UTI --renally adjust medications, avoid nephrotoxic agents / dehydration  / hypotension   --Plan of care discussed with patient and her husband [name])  at bedside, questions answered  Disposition/Need for in-Hospital Stay- patient unable to be discharged at this time due to E. coli bacteremia requiring IV antibiotics pending sensitivities -Anticipate discharge home with home health on 12/22/2020 when sensitivities of the E. coli in the blood and urine are available*  Status is: Inpatient  Remains inpatient appropriate because: Please see disposition above  Disposition: The patient is from: Home              Anticipated d/c is to: Home with [facility] PT              Anticipated d/c date is: 1 day              Patient currently is not medically stable to d/c. Barriers: Not Clinically Stable-   Code Status :  -  Code Status: Full Code   Family Communication:  (patient is alert, awake and coherent)  Discussed with husband [name] at bedside  Consults  :  na  DVT Prophylaxis  :   - SCDs  enoxaparin (LOVENOX) injection 30  mg Start: 12/20/20 0600 SCDs Start: 12/20/20 0200    Lab Results  Component Value Date   PLT 169 12/21/2020    Inpatient Medications  Scheduled Meds:  amLODipine  10 mg Oral Daily   carvedilol  12.5 mg Oral BID WC   clopidogrel  75 mg Oral Daily   enoxaparin (LOVENOX) injection  30 mg Subcutaneous Q24H   feeding supplement  237 mL Oral BID BM   pantoprazole  40 mg Oral Daily   rosuvastatin  20 mg Oral QPM   Continuous Infusions:  cefTRIAXone (ROCEPHIN)  IV 2 g (12/21/20 1226)   PRN Meds:.labetalol    Anti-infectives (From admission, onward)    Start     Dose/Rate Route Frequency Ordered Stop   12/21/20 0000  cefTRIAXone (ROCEPHIN) 2 g in sodium chloride 0.9 % 100 mL  IVPB  Status:  Discontinued        2 g 200 mL/hr over 30 Minutes Intravenous Every 24 hours 12/20/20 0840 12/20/20 0842   12/20/20 1430  doxycycline (VIBRA-TABS) tablet 100 mg  Status:  Discontinued        100 mg Oral Every 12 hours 12/20/20 1331 12/21/20 1051   12/20/20 1200  cefTRIAXone (ROCEPHIN) 2 g in sodium chloride 0.9 % 100 mL IVPB        2 g 200 mL/hr over 30 Minutes Intravenous Every 24 hours 12/20/20 0842     12/20/20 0000  cefTRIAXone (ROCEPHIN) 1 g in sodium chloride 0.9 % 100 mL IVPB  Status:  Discontinued        1 g 200 mL/hr over 30 Minutes Intravenous Every 24 hours 12/19/20 2350 12/20/20 0840         Objective:   Vitals:   12/21/20 0519 12/21/20 0800 12/21/20 1054 12/21/20 1502  BP: (!) 158/74 (!) 195/73 (!) 147/66 (!) 149/65  Pulse: 71 80 71 71  Resp: $Remo'17  18 18  'wPXee$ Temp: 98 F (36.7 C)  98.2 F (36.8 C) 98.2 F (36.8 C)  TempSrc:   Oral Oral  SpO2: 93%  94% 94%  Weight:      Height:        Wt Readings from Last 3 Encounters:  12/20/20 76.3 kg  12/19/20 79.4 kg  12/02/19 79.7 kg     Intake/Output Summary (Last 24 hours) at 12/21/2020 1540 Last data filed at 12/21/2020 1300 Gross per 24 hour  Intake 820 ml  Output 1750 ml  Net -930 ml   Physical Exam  Gen:- Awake Alert,  in no apparent distress  HEENT:- [place].AT, No sclera icterus Neck-Supple Neck,No JVD,.  Lungs-  CTAB , fair symmetrical air movement CV- S1, S2 normal, regular  Abd-  +ve B.Sounds, Abd Soft, No tenderness, no CVA tenderness Extremity/Skin:- No  edema, pedal pulses present  Psych-affect is appropriate, oriented x3 Neuro-residual speech and motor deficits from prior stroke, no new additional focal deficits, no tremors   Data Review:   Micro Results Recent Results (from the past 240 hour(s))  Urine Culture     Status: Abnormal (Preliminary result)   Collection Time: 12/19/20  8:25 AM   Specimen: In/Out Cath Urine  Result Value Ref Range Status   Specimen Description   Final     IN/OUT CATH URINE Performed at [facility], [address], [place], [place] [address]    Special Requests   Final    NONE Performed at [facility], [address], [place], [place] [address]    Culture (A)  Final    >=  100,000 COLONIES/mL ESCHERICHIA COLI SUSCEPTIBILITIES TO FOLLOW Performed at [place] [address] [place], [place] [address]    Report Status PENDING  Incomplete  Blood Culture (routine x 2)     Status: Abnormal (Preliminary result)   Collection Time: 12/19/20  8:56 AM   Specimen: Right Antecubital; Blood  Result Value Ref Range Status   Specimen Description   Final    RIGHT ANTECUBITAL BOTTLES DRAWN AEROBIC AND ANAEROBIC Performed at [facility], [address] [place], [place] [address]    Special Requests   Final    Blood Culture adequate volume Performed at [facility], [address] [place], [place] [address]    Culture  Setup Time   Final    IN BOTH AEROBIC AND ANAEROBIC BOTTLES GRAM NEGATIVE RODS CRITICAL RESULT CALLED TO, READ BACK BY AND VERIFIED WITH: POWELL,V 2220 12/19/2020 COLEMAN,R    Culture (A)  Final    ESCHERICHIA COLI SUSCEPTIBILITIES TO FOLLOW Performed at [facility], [place] [address], [place], [place] [address]    Report Status PENDING  Incomplete  Blood Culture ID Panel (Reflexed)     Status: Abnormal   Collection Time: 12/19/20  8:56 AM  Result Value Ref Range Status   Enterococcus faecalis NOT DETECTED NOT DETECTED Final   Enterococcus Faecium NOT DETECTED NOT DETECTED Final   Listeria monocytogenes NOT DETECTED NOT DETECTED Final   Staphylococcus species NOT DETECTED NOT DETECTED Final   Staphylococcus aureus (BCID) NOT DETECTED NOT DETECTED Final   Staphylococcus epidermidis NOT DETECTED NOT DETECTED Final   Staphylococcus lugdunensis NOT DETECTED NOT DETECTED Final   Streptococcus species NOT DETECTED NOT DETECTED Final   Streptococcus agalactiae NOT DETECTED NOT DETECTED Final   Streptococcus pneumoniae  NOT DETECTED NOT DETECTED Final   Streptococcus pyogenes NOT DETECTED NOT DETECTED Final   A.calcoaceticus-baumannii NOT DETECTED NOT DETECTED Final   Bacteroides fragilis NOT DETECTED NOT DETECTED Final   Enterobacterales DETECTED (A) NOT DETECTED Final    Comment: Enterobacterales represent a large order of gram negative bacteria, not a single organism. CRITICAL RESULT CALLED TO, READ BACK BY AND VERIFIED WITH: TFrederic Jericho RN, AT 6945 12/20/20 D. VANHOOK    Enterobacter cloacae complex NOT DETECTED NOT DETECTED Final   Escherichia coli DETECTED (A) NOT DETECTED Final    Comment: CRITICAL RESULT CALLED TO, READ BACK BY AND VERIFIED WITH: TFrederic Jericho RN, AT 0388 12/20/20 D. VANHOOK    Klebsiella aerogenes NOT DETECTED NOT DETECTED Final   Klebsiella oxytoca NOT DETECTED NOT DETECTED Final   Klebsiella pneumoniae NOT DETECTED NOT DETECTED Final   Proteus species NOT DETECTED NOT DETECTED Final   Salmonella species NOT DETECTED NOT DETECTED Final   Serratia marcescens NOT DETECTED NOT DETECTED Final   Haemophilus influenzae NOT DETECTED NOT DETECTED Final   Neisseria meningitidis NOT DETECTED NOT DETECTED Final   Pseudomonas aeruginosa NOT DETECTED NOT DETECTED Final   Stenotrophomonas maltophilia NOT DETECTED NOT DETECTED Final   Candida albicans NOT DETECTED NOT DETECTED Final   Candida auris NOT DETECTED NOT DETECTED Final   Candida glabrata NOT DETECTED NOT DETECTED Final   Candida krusei NOT DETECTED NOT DETECTED Final   Candida parapsilosis NOT DETECTED NOT DETECTED Final   Candida tropicalis NOT DETECTED NOT DETECTED Final   Cryptococcus neoformans/gattii NOT DETECTED NOT DETECTED Final   CTX-M ESBL NOT DETECTED NOT DETECTED Final   Carbapenem resistance IMP NOT DETECTED NOT DETECTED Final   Carbapenem resistance KPC NOT DETECTED NOT DETECTED Final  Carbapenem resistance NDM NOT DETECTED NOT DETECTED Final   Carbapenem resist OXA 48 LIKE NOT DETECTED NOT DETECTED Final    Carbapenem resistance VIM NOT DETECTED NOT DETECTED Final    Comment: Performed at [facility], [place] [address] [place], [place] [address]  Blood Culture (routine x 2)     Status: None (Preliminary result)   Collection Time: 12/19/20  8:58 AM   Specimen: BLOOD RIGHT ARM  Result Value Ref Range Status   Specimen Description   Final    BLOOD RIGHT ARM BOTTLES DRAWN AEROBIC AND ANAEROBIC Performed at [facility], [address], [place], [place] [address]    Special Requests   Final    Blood Culture adequate volume Performed at [facility], [address], [place], [place] [address]    Culture  Setup Time   Final    AEROBIC AND ANAEROBIC BOTTLE GRAM NEGATIVE RODS CRITICAL RESULT CALLED TO, READ BACK BY AND VERIFIED WITH: POWELL,V 2220 12/19/2020 COLEMAN,R Performed at [facility], [address], [place], [place] [address]    Culture   Final    Lonell Grandchild NEGATIVE RODS IDENTIFICATION TO FOLLOW Performed at [facility], [place] [address] [place], [place] [address]    Report Status PENDING  Incomplete  Resp Panel by RT-PCR (Flu A&B, Covid) Nasopharyngeal Swab     Status: None   Collection Time: 12/19/20 11:15 AM   Specimen: Nasopharyngeal Swab; Nasopharyngeal(NP) swabs in vial transport medium  Result Value Ref Range Status   SARS Coronavirus 2 by RT PCR NEGATIVE NEGATIVE Final    Comment: (NOTE) SARS-CoV-2 target nucleic acids are NOT DETECTED.  The SARS-CoV-2 RNA is generally detectable in upper respiratory specimens during the acute phase of infection. The lowest concentration of SARS-CoV-2 viral copies this assay can detect is 138 copies/mL. A negative result does not preclude SARS-Cov-2 infection and should not be used as the sole basis for treatment or other patient management decisions. A negative result may occur with  improper specimen collection/handling, submission of specimen other than nasopharyngeal swab, presence of viral mutation(s) within the areas targeted  by this assay, and inadequate number of viral copies(<138 copies/mL). A negative result must be combined with clinical observations, patient history, and epidemiological information. The expected result is Negative.  Fact Sheet for Patients:  EntrepreneurPulse.com.au  Fact Sheet for Healthcare Providers:  IncredibleEmployment.be  This test is no t yet approved or cleared by the [place] FDA and  has been authorized for detection and/or diagnosis of SARS-CoV-2 by FDA under an Emergency Use Authorization (EUA). This EUA will remain  in effect (meaning this test can be used) for the duration of the COVID-19 declaration under Section 564(b)(1) of the Act, 21 U.S.C.section 360bbb-3(b)(1), unless the authorization is terminated  or revoked sooner.       Influenza A by PCR NEGATIVE NEGATIVE Final   Influenza B by PCR NEGATIVE NEGATIVE Final    Comment: (NOTE) The Xpert Xpress SARS-CoV-2/FLU/RSV plus assay is intended as an aid in the diagnosis of influenza from Nasopharyngeal swab specimens and should not be used as a sole basis for treatment. Nasal washings and aspirates are unacceptable for Xpert Xpress SARS-CoV-2/FLU/RSV testing.  Fact Sheet for Patients: EntrepreneurPulse.com.au  Fact Sheet for Healthcare Providers: IncredibleEmployment.be  This test is not yet approved or cleared by the [place] FDA and has been authorized for detection and/or diagnosis of SARS-CoV-2 by FDA under an Emergency Use Authorization (EUA). This EUA will remain in effect (meaning  this test can be used) for the duration of the COVID-19 declaration under Section 564(b)(1) of the Act, 21 U.S.C. section 360bbb-3(b)(1), unless the authorization is terminated or revoked.  Performed at [facility], [address], [place], [place] [address]   Resp Panel by RT-PCR (Flu A&B, Covid) Nasopharyngeal Swab     Status: None    Collection Time: 12/19/20 11:55 PM   Specimen: Nasopharyngeal Swab; Nasopharyngeal(NP) swabs in vial transport medium  Result Value Ref Range Status   SARS Coronavirus 2 by RT PCR NEGATIVE NEGATIVE Final    Comment: (NOTE) SARS-CoV-2 target nucleic acids are NOT DETECTED.  The SARS-CoV-2 RNA is generally detectable in upper respiratory specimens during the acute phase of infection. The lowest concentration of SARS-CoV-2 viral copies this assay can detect is 138 copies/mL. A negative result does not preclude SARS-Cov-2 infection and should not be used as the sole basis for treatment or other patient management decisions. A negative result may occur with  improper specimen collection/handling, submission of specimen other than nasopharyngeal swab, presence of viral mutation(s) within the areas targeted by this assay, and inadequate number of viral copies(<138 copies/mL). A negative result must be combined with clinical observations, patient history, and epidemiological information. The expected result is Negative.  Fact Sheet for Patients:  EntrepreneurPulse.com.au  Fact Sheet for Healthcare Providers:  IncredibleEmployment.be  This test is no t yet approved or cleared by the [place] FDA and  has been authorized for detection and/or diagnosis of SARS-CoV-2 by FDA under an Emergency Use Authorization (EUA). This EUA will remain  in effect (meaning this test can be used) for the duration of the COVID-19 declaration under Section 564(b)(1) of the Act, 21 U.S.C.section 360bbb-3(b)(1), unless the authorization is terminated  or revoked sooner.       Influenza A by PCR NEGATIVE NEGATIVE Final   Influenza B by PCR NEGATIVE NEGATIVE Final    Comment: (NOTE) The Xpert Xpress SARS-CoV-2/FLU/RSV plus assay is intended as an aid in the diagnosis of influenza from Nasopharyngeal swab specimens and should not be used as a sole basis for treatment.  Nasal washings and aspirates are unacceptable for Xpert Xpress SARS-CoV-2/FLU/RSV testing.  Fact Sheet for Patients: EntrepreneurPulse.com.au  Fact Sheet for Healthcare Providers: IncredibleEmployment.be  This test is not yet approved or cleared by the [place] FDA and has been authorized for detection and/or diagnosis of SARS-CoV-2 by FDA under an Emergency Use Authorization (EUA). This EUA will remain in effect (meaning this test can be used) for the duration of the COVID-19 declaration under Section 564(b)(1) of the Act, 21 U.S.C. section 360bbb-3(b)(1), unless the authorization is terminated or revoked.  Performed at [facility], [address], [place], [place] [address]   Blood Culture (routine x 2)     Status: None (Preliminary result)   Collection Time: 12/20/20 12:11 AM   Specimen: BLOOD RIGHT FOREARM  Result Value Ref Range Status   Specimen Description BLOOD RIGHT FOREARM  Final   Special Requests   Final    BOTTLES DRAWN AEROBIC AND ANAEROBIC Blood Culture adequate volume   Culture   Final    NO GROWTH < 12 HOURS Performed at [facility], [address], [place], [place] [address]    Report Status PENDING  Incomplete  Blood Culture (routine x 2)     Status: None (Preliminary result)   Collection Time: 12/20/20 12:25 AM   Specimen: BLOOD RIGHT HAND  Result Value Ref Range Status   Specimen Description BLOOD RIGHT HAND  Final   Special Requests   Final    BOTTLES DRAWN AEROBIC ONLY Blood Culture adequate volume   Culture   Final    NO GROWTH < 12 HOURS Performed at [facility], [address] [place], [place] [address]    Report Status PENDING  Incomplete  Urine Culture     Status: Abnormal (Preliminary result)   Collection Time: 12/20/20 12:59 AM   Specimen: In/Out Cath Urine  Result Value Ref Range Status   Specimen Description   Final    IN/OUT CATH URINE Performed at [facility], [address], [place],  [place] [address]    Special Requests   Final    NONE Performed at [facility], [address], [place], [place] [address]    Culture 10,000 COLONIES/mL ESCHERICHIA COLI (A)  Final   Report Status PENDING  Incomplete    Radiology Reports DG Chest Port 1 View  Result Date: 12/20/2020 CLINICAL DATA:  Fever, chills EXAM: PORTABLE CHEST 1 VIEW COMPARISON:  05/21/2021 FINDINGS: Mild patchy right upper lobe opacity, not well visualized on recent prior, favoring mild infection/pneumonia. Increased interstitial markings. No frank interstitial edema. No definite pleural effusions. No pneumothorax. Cardiomegaly. IMPRESSION: Mild patchy right upper lobe opacity, not well visualized on recent prior, favoring mild infection/pneumonia. Follow-up chest radiographs are suggested to document clearance. Electronically Signed   By: Julian Hy M.D.   On: 12/20/2020 00:38   DG Chest Port 1 View  Result Date: 12/19/2020 CLINICAL DATA:  Altered mental status EXAM: PORTABLE CHEST 1 VIEW COMPARISON:  11/17/2020 FINDINGS: Normal cardiac silhouette. Low lung volumes. Mild central venous congestion. Platelike atelectasis in the RIGHT upper lobe is unchanged. IMPRESSION: No interval change. Low lung volumes, central venous congestion and  atelectasis. Electronically Signed   By: Suzy Bouchard M.D.   On: 12/19/2020 09:39     CBC Recent Labs  Lab 12/19/20 0855 12/20/20 0011 12/20/20 0419 12/21/20 0334  WBC 12.0* 20.0* 15.7* 11.1*  HGB 13.5 12.8 11.5* 11.0*  HCT 42.8 41.4 37.7 35.5*  PLT 190 184 161 169  MCV 86.3 86.6 87.5 86.6  MCH 27.2 26.8 26.7 26.8  MCHC 31.5 30.9 30.5 31.0  RDW 13.8 13.8 13.8 13.8  LYMPHSABS 0.3* 1.2  --   --   MONOABS 0.3 1.5*  --   --   EOSABS 0.1 0.1  --   --   BASOSABS 0.1 0.1  --   --     Chemistries  Recent Labs  Lab 12/19/20 0855 12/20/20 0011 12/20/20 0419 12/21/20 0334  NA 141 139 142 142  K 2.8* 3.2* 3.7 3.7  CL 108 108 107 108  CO2 $Re'25 24 27 25  'HPO$ GLUCOSE 123* 123*  84 103*  BUN $Re'21 22 20 17  'Gbw$ CREATININE 1.63* 1.69* 1.53* 1.46*  CALCIUM 8.4* 8.1* 8.1* 8.0*  MG  --   --  2.2  --   AST $Re'21 18 17  'mUa$ --   ALT $Re'12 12 10  'DwV$ --   ALKPHOS 80 70 61  --   BILITOT 1.3* 1.0 0.6  --    ------------------------------------------------------------------------------------------------------------------ No results for input(s): CHOL, HDL, LDLCALC, TRIG, CHOLHDL, LDLDIRECT in the last 72 hours.  Lab Results  Component Value Date   HGBA1C  12/09/2008    5.4 (NOTE) The ADA recommends the following therapeutic goal for glycemic control related to Hgb A1c measurement: Goal of therapy: <6.5 Hgb A1c  Reference: American Diabetes Association: Clinical Practice Recommendations 2010, Diabetes Care, 2010, 33: (Suppl  1).   ------------------------------------------------------------------------------------------------------------------ No results for input(s): TSH, T4TOTAL, T3FREE, THYROIDAB in the last 72 hours.  Invalid input(s): FREET3 ------------------------------------------------------------------------------------------------------------------ No results for input(s): VITAMINB12, FOLATE, FERRITIN, TIBC, IRON, RETICCTPCT in the last 72 hours.  Coagulation profile Recent Labs  Lab 12/19/20 0855 12/20/20 0011 12/20/20 0419  INR 1.0 1.1 1.2    No results for input(s): DDIMER in the last 72 hours.  Cardiac Enzymes No results for input(s): CKMB, TROPONINI, MYOGLOBIN in the last 168 hours.  Invalid input(s): CK ------------------------------------------------------------------------------------------------------------------    Component Value Date/Time   BNP 31.0 04/17/2018 1259     Singleton Hickox M.D on 12/21/2020 at 3:40 PM  Go to [email or web address] - for contact info  [facility] - Office  [phone]

## 2020-12-22 DIAGNOSIS — J9600 Acute respiratory failure, unspecified whether with hypoxia or hypercapnia: Secondary | ICD-10-CM

## 2020-12-22 DIAGNOSIS — A4151 Sepsis due to Escherichia coli [E. coli]: Principal | ICD-10-CM

## 2020-12-22 DIAGNOSIS — R6520 Severe sepsis without septic shock: Secondary | ICD-10-CM

## 2020-12-22 LAB — CULTURE, BLOOD (ROUTINE X 2)
Special Requests: ADEQUATE
Special Requests: ADEQUATE

## 2020-12-22 LAB — BASIC METABOLIC PANEL
Anion gap: 8 (ref 5–15)
BUN: 19 mg/dL (ref 8–23)
CO2: 25 mmol/L (ref 22–32)
Calcium: 8 mg/dL — ABNORMAL LOW (ref 8.9–10.3)
Chloride: 108 mmol/L (ref 98–111)
Creatinine, Ser: 1.48 mg/dL — ABNORMAL HIGH (ref 0.44–1.00)
GFR, Estimated: 35 mL/min — ABNORMAL LOW (ref >60)
Glucose, Bld: 101 mg/dL — ABNORMAL HIGH (ref 70–99)
Potassium: 3.5 mmol/L (ref 3.5–5.1)
Sodium: 141 mmol/L (ref 135–145)

## 2020-12-22 LAB — CBC
HCT: 35.6 % — ABNORMAL LOW (ref 36.0–46.0)
Hemoglobin: 11.1 g/dL — ABNORMAL LOW (ref 12.0–15.0)
MCH: 27 pg (ref 26.0–34.0)
MCHC: 31.2 g/dL (ref 30.0–36.0)
MCV: 86.6 fL (ref 80.0–100.0)
Platelets: 175 10*3/uL (ref 150–400)
RBC: 4.11 MIL/uL (ref 3.87–5.11)
RDW: 13.7 % (ref 11.5–15.5)
WBC: 6.9 10*3/uL (ref 4.0–10.5)
nRBC: 0 % (ref 0.0–0.2)

## 2020-12-22 LAB — URINE CULTURE
Culture: 10000 — AB
Culture: >=100000 — AB

## 2020-12-22 MED ORDER — CARVEDILOL 25 MG PO TABS: 25.0000 mg | ORAL_TABLET | Freq: Two times a day (BID) | ORAL | 2 refills | Status: DC

## 2020-12-22 MED ORDER — CIPROFLOXACIN HCL 500 MG PO TABS: 500.0000 mg | ORAL_TABLET | Freq: Two times a day (BID) | ORAL | 0 refills | Status: AC

## 2020-12-22 MED ORDER — FLUCONAZOLE 150 MG PO TABS: 150.0000 mg | ORAL_TABLET | Freq: Every day | ORAL | 1 refills | Status: AC

## 2020-12-22 MED ORDER — FLORANEX PO PACK: 1.0000 g | PACK | Freq: Three times a day (TID) | ORAL | 0 refills | Status: AC

## 2020-12-22 MED ORDER — SODIUM CHLORIDE 0.9 % IV SOLN: INTRAVENOUS | Status: DC

## 2020-12-22 MED ORDER — CARVEDILOL 12.5 MG PO TABS: 25.0000 mg | ORAL_TABLET | Freq: Two times a day (BID) | ORAL | Status: DC

## 2020-12-22 MED ORDER — FLORANEX PO PACK
1.0000 g | PACK | Freq: Three times a day (TID) | ORAL | Status: DC
  Filled 2020-12-22 (×7): qty 1

## 2020-12-22 NOTE — Discharge Summary (Signed)
Physician Discharge Summary [facility]    Patient: [name]                   Admit date: 12/19/2020   DOB: 1934/09/26             Discharge date:12/22/2020/10:26 AM NID:[id number]                          PCP: Susy Frizzle, MD  Disposition: HOME with Home Health   Recommendations for Outpatient Follow-up:   Follow up: with PCP in 1 wk   Discharge Condition: Stable   Code Status:   Code Status: Full Code  Diet recommendation: Cardiac diet   Discharge Diagnoses:    Principal Problem:   Sepsis due to Escherichia coli (E. coli) -from urinary source--POA Active Problems:   Hypertension   UTI (urinary tract infection)   Leukocytosis   Cerebrovascular disease   Hypokalemia   Hypoalbuminemia due to protein-calorie malnutrition (HCC)   Obesity (BMI 30.0-34.9)   CKD (chronic kidney disease), stage III (HCC)   Prolonged QT interval   Hyperlipidemia   History of Present Illness/ Hospital Course [name] Summary:     Brief Summary:_ 86 y.o. female with medical history significant for cerebrovascular disease s/p CVA in 2016, hypertension, hyperlipidemia, GERD, glaucoma admitted on 12/20/2020 with E. coli sepsis secondary to presumed urinary source     A/p 1) E. coli sepsis secondary to presumed urinary source----POA -patient met sepsis criteria on admission with leukocytosis (wbc 20.0) , fevers (101.7)  and tachypnea (RR 30) -Lactic acid was 1.3 -Blood cultures from 12/19/2020 and urine culture from 12/20/2020 growing E. Coli-pansensitive -Continue IV Rocephin 2 g daily pending sensitivity of E. Coli 05/11/2021 -Switch to p.o. ciprofloxacin for 10 more days   2) hypokalemia--most likely due to HCTZ use, replaced potassium and hold HCTZ, Norvasc added   3) history of prior CVA--- okay to continue clopidogrel and Crestor   4)HTN-hold lisinopril HCTZ due to hypokalemia and UTI with AKI on CKD concerns -Okay to use amlodipine and Coreg for BP control     5) generalized weakness/deconditioning----PT eval appreciated, recommends home health PT/OT    6) CKD stage - 3B -Renal function is close to baseline, hold lisinopril HCTZ given sepsis and UTI --renally adjust medications, avoid nephrotoxic agents / dehydration  / hypotension     --Plan of care discussed with patient and her husband [name])  at bedside, questions answered    Status is: Inpatient     Disposition: The patient is from: Home              Anticipated d/c is to: Home with [facility]               Anticipated d/c date is: 1 day              Patient currently is not medically stable to d/c.  Code Status :  -  Code Status: Full Code    Family Communication:  (patient is alert, awake and coherent) Discussed with husband [name] at bedside       Discharge Instructions:   Discharge Instructions     Activity as tolerated - No restrictions   Complete by: As directed    Diet - low sodium heart healthy   Complete by: As directed    Discharge instructions   Complete by: As directed    Follow-up PCP in 1 week   Increase activity  slowly   Complete by: As directed         Medication List     STOP taking these medications    cephALEXin 500 MG capsule Commonly known as: KEFLEX   lisinopril 5 MG tablet Commonly known as: ZESTRIL   lisinopril-hydrochlorothiazide 20-12.5 MG tablet Commonly known as: ZESTORETIC       TAKE these medications    carvedilol 25 MG tablet Commonly known as: COREG Take 1 tablet (25 mg total) by mouth 2 (two) times daily with a meal. What changed:  medication strength how much to take when to take this   [employer] Tabs Take 1 tablet by mouth daily.   ciprofloxacin 500 MG tablet Commonly known as: Cipro Take 1 tablet (500 mg total) by mouth 2 (two) times daily for 10 days.   dorzolamide-timolol 22.3-6.8 MG/ML ophthalmic solution Commonly known as: COSOPT Place 1 drop into both eyes 2 (two) times daily.   fluconazole  150 MG tablet Commonly known as: Diflucan Take 1 tablet (150 mg total) by mouth daily for 1 dose. For urinary symptoms, discharge on antibiotics   lactobacillus Pack Take 1 packet (1 g total) by mouth 3 (three) times daily with meals for 10 days.   potassium chloride 10 MEQ tablet Commonly known as: KLOR-CON Take 1 tablet (10 mEq total) by mouth daily for 7 days.   Rocklatan 0.02-0.005 % Soln Generic drug: Netarsudil-Latanoprost Apply 1 drop to eye daily.   senna 8.6 MG tablet Commonly known as: SENOKOT Take 1 tablet by mouth at bedtime.       ASK your doctor about these medications    clopidogrel 75 MG tablet Commonly known as: PLAVIX TAKE ONE TABLET BY MOUTH ONCE DAILY.   pantoprazole 40 MG tablet Commonly known as: PROTONIX TAKE (1) TABLET BY MOUTH ONCE DAILY.   rosuvastatin 20 MG tablet Commonly known as: CRESTOR TAKE (1) TABLET BY MOUTH AT BEDTIME.        No Known Allergies   Procedures /Studies:   DG Chest Port 1 View  Result Date: 12/20/2020 CLINICAL DATA:  Fever, chills EXAM: PORTABLE CHEST 1 VIEW COMPARISON:  05/21/2021 FINDINGS: Mild patchy right upper lobe opacity, not well visualized on recent prior, favoring mild infection/pneumonia. Increased interstitial markings. No frank interstitial edema. No definite pleural effusions. No pneumothorax. Cardiomegaly. IMPRESSION: Mild patchy right upper lobe opacity, not well visualized on recent prior, favoring mild infection/pneumonia. Follow-up chest radiographs are suggested to document clearance. Electronically Signed   By: Julian Hy M.D.   On: 12/20/2020 00:38   DG Chest Port 1 View  Result Date: 12/19/2020 CLINICAL DATA:  Altered mental status EXAM: PORTABLE CHEST 1 VIEW COMPARISON:  11/17/2020 FINDINGS: Normal cardiac silhouette. Low lung volumes. Mild central venous congestion. Platelike atelectasis in the RIGHT upper lobe is unchanged. IMPRESSION: No interval change. Low lung volumes, central  venous congestion and  atelectasis. Electronically Signed   By: Suzy Bouchard M.D.   On: 12/19/2020 09:39    Subjective:   Patient was seen and examined 12/22/2020, 10:26 AM Patient stable today. No acute distress.  No issues overnight Stable for discharge.  Discharge Exam:    Vitals:   12/21/20 2012 12/21/20 2014 12/22/20 0517 12/22/20 0851  BP: (!) 166/74  (!) 167/71 (!) 182/76  Pulse: 78  73 74  Resp: 20  19   Temp: 98.2 F (36.8 C)  98.5 F (36.9 C)   TempSrc: Oral  Oral   SpO2: 93% 96% 93%  Weight:      Height:        General: Pt lying comfortably in bed & appears in no obvious distress. Cardiovascular: S1 & S2 heard, RRR, S1/S2 +. No murmurs, rubs, gallops or clicks. No JVD or pedal edema. Respiratory: Clear to auscultation without wheezing, rhonchi or crackles. No increased work of breathing. Abdominal:  Non-distended, non-tender & soft. No organomegaly or masses appreciated. Normal bowel sounds heard. CNS: Alert and oriented. No focal deficits. Extremities: no edema, no cyanosis      The results of significant diagnostics from this hospitalization (including imaging, microbiology, ancillary and laboratory) are listed below for reference.      Microbiology:   Recent Results (from the past 240 hour(s))  Urine Culture     Status: Abnormal   Collection Time: 12/19/20  8:25 AM   Specimen: In/Out Cath Urine  Result Value Ref Range Status   Specimen Description   Final    IN/OUT CATH URINE Performed at [facility], [address] [place], [place] [address]    Special Requests   Final    NONE Performed at [facility], [address] [place], [place] [address]    Culture >=100,000 COLONIES/mL ESCHERICHIA COLI (A)  Final   Report Status 12/22/2020 FINAL  Final   Organism ID, Bacteria ESCHERICHIA COLI (A)  Final      Susceptibility   Escherichia coli - MIC*    AMPICILLIN <=2 SENSITIVE Sensitive     CEFAZOLIN <=4 SENSITIVE Sensitive     CEFEPIME <=0.12  SENSITIVE Sensitive     CEFTRIAXONE <=0.25 SENSITIVE Sensitive     CIPROFLOXACIN <=0.25 SENSITIVE Sensitive     GENTAMICIN <=1 SENSITIVE Sensitive     IMIPENEM <=0.25 SENSITIVE Sensitive     NITROFURANTOIN <=16 SENSITIVE Sensitive     TRIMETH/SULFA <=20 SENSITIVE Sensitive     AMPICILLIN/SULBACTAM <=2 SENSITIVE Sensitive     PIP/TAZO <=4 SENSITIVE Sensitive     * >=100,000 COLONIES/mL ESCHERICHIA COLI  Blood Culture (routine x 2)     Status: Abnormal   Collection Time: 12/19/20  8:56 AM   Specimen: Right Antecubital; Blood  Result Value Ref Range Status   Specimen Description   Final    RIGHT ANTECUBITAL BOTTLES DRAWN AEROBIC AND ANAEROBIC Performed at [facility], [address] [place], [place] [address]    Special Requests   Final    Blood Culture adequate volume Performed at [facility], [address] [place], [place] [address]    Culture  Setup Time   Final    IN BOTH AEROBIC AND ANAEROBIC BOTTLES GRAM NEGATIVE RODS CRITICAL RESULT CALLED TO, READ BACK BY AND VERIFIED WITH: POWELL,V 2220 12/19/2020 COLEMAN,R    Culture ESCHERICHIA COLI (A)  Final   Report Status 12/22/2020 FINAL  Final   Organism ID, Bacteria ESCHERICHIA COLI  Final      Susceptibility   Escherichia coli - MIC*    AMPICILLIN <=2 SENSITIVE Sensitive     CEFAZOLIN <=4 SENSITIVE Sensitive     CEFEPIME <=0.12 SENSITIVE Sensitive     CEFTAZIDIME <=1 SENSITIVE Sensitive     CEFTRIAXONE <=0.25 SENSITIVE Sensitive     CIPROFLOXACIN <=0.25 SENSITIVE Sensitive     GENTAMICIN <=1 SENSITIVE Sensitive     IMIPENEM <=0.25 SENSITIVE Sensitive     TRIMETH/SULFA <=20 SENSITIVE Sensitive     AMPICILLIN/SULBACTAM <=2 SENSITIVE Sensitive     PIP/TAZO <=4 SENSITIVE Sensitive     * ESCHERICHIA COLI  Blood Culture ID Panel (Reflexed)  Status: Abnormal   Collection Time: 12/19/20  8:56 AM  Result Value Ref Range Status   Enterococcus faecalis NOT DETECTED NOT DETECTED Final   Enterococcus Faecium NOT DETECTED NOT  DETECTED Final   Listeria monocytogenes NOT DETECTED NOT DETECTED Final   Staphylococcus species NOT DETECTED NOT DETECTED Final   Staphylococcus aureus (BCID) NOT DETECTED NOT DETECTED Final   Staphylococcus epidermidis NOT DETECTED NOT DETECTED Final   Staphylococcus lugdunensis NOT DETECTED NOT DETECTED Final   Streptococcus species NOT DETECTED NOT DETECTED Final   Streptococcus agalactiae NOT DETECTED NOT DETECTED Final   Streptococcus pneumoniae NOT DETECTED NOT DETECTED Final   Streptococcus pyogenes NOT DETECTED NOT DETECTED Final   A.calcoaceticus-baumannii NOT DETECTED NOT DETECTED Final   Bacteroides fragilis NOT DETECTED NOT DETECTED Final   Enterobacterales DETECTED (A) NOT DETECTED Final    Comment: Enterobacterales represent a large order of gram negative bacteria, not a single organism. CRITICAL RESULT CALLED TO, READ BACK BY AND VERIFIED WITH: TFrederic Jericho RN, AT 0272 12/20/20 D. VANHOOK    Enterobacter cloacae complex NOT DETECTED NOT DETECTED Final   Escherichia coli DETECTED (A) NOT DETECTED Final    Comment: CRITICAL RESULT CALLED TO, READ BACK BY AND VERIFIED WITH: TFrederic Jericho RN, AT 5366 12/20/20 D. VANHOOK    Klebsiella aerogenes NOT DETECTED NOT DETECTED Final   Klebsiella oxytoca NOT DETECTED NOT DETECTED Final   Klebsiella pneumoniae NOT DETECTED NOT DETECTED Final   Proteus species NOT DETECTED NOT DETECTED Final   Salmonella species NOT DETECTED NOT DETECTED Final   Serratia marcescens NOT DETECTED NOT DETECTED Final   Haemophilus influenzae NOT DETECTED NOT DETECTED Final   Neisseria meningitidis NOT DETECTED NOT DETECTED Final   Pseudomonas aeruginosa NOT DETECTED NOT DETECTED Final   Stenotrophomonas maltophilia NOT DETECTED NOT DETECTED Final   Candida albicans NOT DETECTED NOT DETECTED Final   Candida auris NOT DETECTED NOT DETECTED Final   Candida glabrata NOT DETECTED NOT DETECTED Final   Candida krusei NOT DETECTED NOT DETECTED Final   Candida  parapsilosis NOT DETECTED NOT DETECTED Final   Candida tropicalis NOT DETECTED NOT DETECTED Final   Cryptococcus neoformans/gattii NOT DETECTED NOT DETECTED Final   CTX-M ESBL NOT DETECTED NOT DETECTED Final   Carbapenem resistance IMP NOT DETECTED NOT DETECTED Final   Carbapenem resistance KPC NOT DETECTED NOT DETECTED Final   Carbapenem resistance NDM NOT DETECTED NOT DETECTED Final   Carbapenem resist OXA 48 LIKE NOT DETECTED NOT DETECTED Final   Carbapenem resistance VIM NOT DETECTED NOT DETECTED Final    Comment: Performed at [facility], [address], [place], [place] [address]  Blood Culture (routine x 2)     Status: Abnormal   Collection Time: 12/19/20  8:58 AM   Specimen: BLOOD RIGHT ARM  Result Value Ref Range Status   Specimen Description   Final    BLOOD RIGHT ARM BOTTLES DRAWN AEROBIC AND ANAEROBIC Performed at [facility], [address] [place], [place] [address]    Special Requests   Final    Blood Culture adequate volume Performed at [facility], [address] [place], [place] [address]    Culture  Setup Time   Final    AEROBIC AND ANAEROBIC BOTTLE GRAM NEGATIVE RODS CRITICAL RESULT CALLED TO, READ BACK BY AND VERIFIED WITH: POWELL,V 2220 12/19/2020 COLEMAN,R Performed at [facility], [address] [place], [place] [address]    Culture (A)  Final    ESCHERICHIA COLI SUSCEPTIBILITIES PERFORMED ON PREVIOUS  CULTURE WITHIN THE LAST 5 DAYS. Performed at [facility], [place] [address], [place], [place] [address]    Report Status 12/22/2020 FINAL  Final  Resp Panel by RT-PCR (Flu A&B, Covid) Nasopharyngeal Swab     Status: None   Collection Time: 12/19/20 11:15 AM   Specimen: Nasopharyngeal Swab; Nasopharyngeal(NP) swabs in vial transport medium  Result Value Ref Range Status   SARS Coronavirus 2 by RT PCR NEGATIVE NEGATIVE Final    Comment: (NOTE) SARS-CoV-2 target nucleic acids are NOT DETECTED.  The SARS-CoV-2 RNA is generally detectable in  upper respiratory specimens during the acute phase of infection. The lowest concentration of SARS-CoV-2 viral copies this assay can detect is 138 copies/mL. A negative result does not preclude SARS-Cov-2 infection and should not be used as the sole basis for treatment or other patient management decisions. A negative result may occur with  improper specimen collection/handling, submission of specimen other than nasopharyngeal swab, presence of viral mutation(s) within the areas targeted by this assay, and inadequate number of viral copies(<138 copies/mL). A negative result must be combined with clinical observations, patient history, and epidemiological information. The expected result is Negative.  Fact Sheet for Patients:  EntrepreneurPulse.com.au  Fact Sheet for Healthcare Providers:  IncredibleEmployment.be  This test is no t yet approved or cleared by the [place] FDA and  has been authorized for detection and/or diagnosis of SARS-CoV-2 by FDA under an Emergency Use Authorization (EUA). This EUA will remain  in effect (meaning this test can be used) for the duration of the COVID-19 declaration under Section 564(b)(1) of the Act, 21 U.S.C.section 360bbb-3(b)(1), unless the authorization is terminated  or revoked sooner.       Influenza A by PCR NEGATIVE NEGATIVE Final   Influenza B by PCR NEGATIVE NEGATIVE Final    Comment: (NOTE) The Xpert Xpress SARS-CoV-2/FLU/RSV plus assay is intended as an aid in the diagnosis of influenza from Nasopharyngeal swab specimens and should not be used as a sole basis for treatment. Nasal washings and aspirates are unacceptable for Xpert Xpress SARS-CoV-2/FLU/RSV testing.  Fact Sheet for Patients: EntrepreneurPulse.com.au  Fact Sheet for Healthcare Providers: IncredibleEmployment.be  This test is not yet approved or cleared by the [place] FDA and has been  authorized for detection and/or diagnosis of SARS-CoV-2 by FDA under an Emergency Use Authorization (EUA). This EUA will remain in effect (meaning this test can be used) for the duration of the COVID-19 declaration under Section 564(b)(1) of the Act, 21 U.S.C. section 360bbb-3(b)(1), unless the authorization is terminated or revoked.  Performed at [facility], [address] [place], [place] [address]   Resp Panel by RT-PCR (Flu A&B, Covid) Nasopharyngeal Swab     Status: None   Collection Time: 12/19/20 11:55 PM   Specimen: Nasopharyngeal Swab; Nasopharyngeal(NP) swabs in vial transport medium  Result Value Ref Range Status   SARS Coronavirus 2 by RT PCR NEGATIVE NEGATIVE Final    Comment: (NOTE) SARS-CoV-2 target nucleic acids are NOT DETECTED.  The SARS-CoV-2 RNA is generally detectable in upper respiratory specimens during the acute phase of infection. The lowest concentration of SARS-CoV-2 viral copies this assay can detect is 138 copies/mL. A negative result does not preclude SARS-Cov-2 infection and should not be used as the sole basis for treatment or other patient management decisions. A negative result may occur with  improper specimen collection/handling, submission of specimen other than nasopharyngeal swab, presence of viral mutation(s) within the areas targeted by this assay, and inadequate  number of viral copies(<138 copies/mL). A negative result must be combined with clinical observations, patient history, and epidemiological information. The expected result is Negative.  Fact Sheet for Patients:  EntrepreneurPulse.com.au  Fact Sheet for Healthcare Providers:  IncredibleEmployment.be  This test is no t yet approved or cleared by the [place] FDA and  has been authorized for detection and/or diagnosis of SARS-CoV-2 by FDA under an Emergency Use Authorization (EUA). This EUA will remain  in effect (meaning this test can be  used) for the duration of the COVID-19 declaration under Section 564(b)(1) of the Act, 21 U.S.C.section 360bbb-3(b)(1), unless the authorization is terminated  or revoked sooner.       Influenza A by PCR NEGATIVE NEGATIVE Final   Influenza B by PCR NEGATIVE NEGATIVE Final    Comment: (NOTE) The Xpert Xpress SARS-CoV-2/FLU/RSV plus assay is intended as an aid in the diagnosis of influenza from Nasopharyngeal swab specimens and should not be used as a sole basis for treatment. Nasal washings and aspirates are unacceptable for Xpert Xpress SARS-CoV-2/FLU/RSV testing.  Fact Sheet for Patients: EntrepreneurPulse.com.au  Fact Sheet for Healthcare Providers: IncredibleEmployment.be  This test is not yet approved or cleared by the [place] FDA and has been authorized for detection and/or diagnosis of SARS-CoV-2 by FDA under an Emergency Use Authorization (EUA). This EUA will remain in effect (meaning this test can be used) for the duration of the COVID-19 declaration under Section 564(b)(1) of the Act, 21 U.S.C. section 360bbb-3(b)(1), unless the authorization is terminated or revoked.  Performed at [facility], [address] [place], [place] [address]   Blood Culture (routine x 2)     Status: None (Preliminary result)   Collection Time: 12/20/20 12:11 AM   Specimen: BLOOD RIGHT FOREARM  Result Value Ref Range Status   Specimen Description BLOOD RIGHT FOREARM  Final   Special Requests   Final    BOTTLES DRAWN AEROBIC AND ANAEROBIC Blood Culture adequate volume   Culture   Final    NO GROWTH 2 DAYS Performed at [facility], [address] [place], [place] [address]    Report Status PENDING  Incomplete  Blood Culture (routine x 2)     Status: None (Preliminary result)   Collection Time: 12/20/20 12:25 AM   Specimen: BLOOD RIGHT HAND  Result Value Ref Range Status   Specimen Description BLOOD RIGHT HAND  Final   Special Requests   Final     BOTTLES DRAWN AEROBIC ONLY Blood Culture adequate volume   Culture   Final    NO GROWTH 2 DAYS Performed at [facility], [address], [place], [place] [address]    Report Status PENDING  Incomplete  Urine Culture     Status: Abnormal   Collection Time: 12/20/20 12:59 AM   Specimen: In/Out Cath Urine  Result Value Ref Range Status   Specimen Description   Final    IN/OUT CATH URINE Performed at [facility], [address], [place], [place] [address]    Special Requests   Final    NONE Performed at [facility], [address] [place], [place] [address]    Culture 10,000 COLONIES/mL ESCHERICHIA COLI (A)  Final   Report Status 12/22/2020 FINAL  Final   Organism ID, Bacteria ESCHERICHIA COLI (A)  Final      Susceptibility   Escherichia coli - MIC*    AMPICILLIN <=2 SENSITIVE Sensitive     CEFAZOLIN <=4 SENSITIVE Sensitive     CEFEPIME <=0.12 SENSITIVE Sensitive  CEFTRIAXONE <=0.25 SENSITIVE Sensitive     CIPROFLOXACIN <=0.25 SENSITIVE Sensitive     GENTAMICIN <=1 SENSITIVE Sensitive     IMIPENEM <=0.25 SENSITIVE Sensitive     NITROFURANTOIN <=16 SENSITIVE Sensitive     TRIMETH/SULFA <=20 SENSITIVE Sensitive     AMPICILLIN/SULBACTAM <=2 SENSITIVE Sensitive     PIP/TAZO <=4 SENSITIVE Sensitive     * 10,000 COLONIES/mL ESCHERICHIA COLI     Labs:   CBC: Recent Labs  Lab 12/19/20 0855 12/20/20 0011 12/20/20 0419 12/21/20 0334 12/22/20 0332  WBC 12.0* 20.0* 15.7* 11.1* 6.9  NEUTROABS 11.2* 16.9*  --   --   --   HGB 13.5 12.8 11.5* 11.0* 11.1*  HCT 42.8 41.4 37.7 35.5* 35.6*  MCV 86.3 86.6 87.5 86.6 86.6  PLT 190 184 161 169 559   Basic Metabolic Panel: Recent Labs  Lab 12/19/20 0855 12/20/20 0011 12/20/20 0419 12/21/20 0334 12/22/20 0332  NA 141 139 142 142 141  K 2.8* 3.2* 3.7 3.7 3.5  CL 108 108 107 108 108  CO2 _0 GLUCOSE 123* 123* 84 103* 101*  BUN _1 CREATININE 1.63* 1.69* 1.53* 1.46* 1.48*  CALCIUM 8.4* 8.1* 8.1*  8.0* 8.0*  MG  --   --  2.2  --   --   PHOS  --   --  2.5  --   --    Liver Function Tests: Recent Labs  Lab 12/19/20 0855 12/20/20 0011 12/20/20 0419  AST _2 ALT _3 ALKPHOS 80 70 61  BILITOT 1.3* 1.0 0.6  PROT 6.3* 6.0* 5.3*  ALBUMIN 3.3* 3.0* 2.7*   BNP (last 3 results) No results for input(s): BNP in the last 8760 hours. Cardiac Enzymes: No results for input(s): CKTOTAL, CKMB, CKMBINDEX, TROPONINI in the last 168 hours. CBG: No results for input(s): GLUCAP in the last 168 hours. Hgb A1c No results for input(s): HGBA1C in the last 72 hours. Lipid Profile No results for input(s): CHOL, HDL, LDLCALC, TRIG, CHOLHDL, LDLDIRECT in the last 72 hours. Thyroid function studies No results for input(s): TSH, T4TOTAL, T3FREE, THYROIDAB in the last 72 hours.  Invalid input(s): FREET3 Anemia work up No results for input(s): VITAMINB12, FOLATE, FERRITIN, TIBC, IRON, RETICCTPCT in the last 72 hours. Urinalysis    Component Value Date/Time   COLORURINE YELLOW 12/20/2020 0059   APPEARANCEUR CLOUDY (A) 12/20/2020 0059   LABSPEC 1.011 12/20/2020 0059   PHURINE 6.0 12/20/2020 0059   GLUCOSEU NEGATIVE 12/20/2020 0059   HGBUR LARGE (A) 12/20/2020 0059   BILIRUBINUR NEGATIVE 12/20/2020 0059   KETONESUR NEGATIVE 12/20/2020 0059   PROTEINUR 30 (A) 12/20/2020 0059   UROBILINOGEN 0.2 06/12/2014 1137   NITRITE NEGATIVE 12/20/2020 0059   LEUKOCYTESUR LARGE (A) 12/20/2020 0059         Time coordinating discharge: Over 45 minutes  SIGNED: Deatra James, MD, FACP, FHM. [facility],  Please use [email or web address] to Page If 7PM-7AM, please contact night-coverage [email or web address], Password [facility] 12/22/2020, 10:26 AM

## 2020-12-22 NOTE — TOC Transition Note (Signed)
Transition of Care [facility]) - CM/SW Discharge Note   Patient Details  Name: [name] MRN: [id number] Date of Birth: 09/25/1934  Transition of Care [facility]) CM/SW Contact:  Shade Flood, LCSW Phone Number: 12/22/2020, 11:26 AM   Clinical Narrative:     Pt stable for dc today per MD. [facility] PT/OT ordered by MD. Damaris Schooner with pt's husband to review dc planning including recommendation for [facility]. Pt's husband agreeable to [facility] referral. CMS provider options discussed. Referred to [facility] as requested.   There are no other TOC needs for dc.  Final next level of care: [place] Barriers to Discharge: Barriers Resolved   Patient Goals and CMS Choice Patient states their goals for this hospitalization and ongoing recovery are:: go home CMS Medicare.gov Compare Post Acute Care list provided to:: Patient Represenative (must comment) Choice offered to / list presented to : Spouse  Discharge Placement                       Discharge Plan and Services In-house Referral: Clinical Social Work   Post Acute Care Choice: Home Health                               Social Determinants of Health (SDOH) Interventions     Readmission Risk Interventions No flowsheet data found.

## 2020-12-22 NOTE — Progress Notes (Signed)
Nsg Discharge Note  Admit Date:  12/19/2020 Discharge date: 12/22/2020   [name] to be D/C'd Home per MD order.  AVS completed.  Copy for chart, and copy for patient signed, and dated. Patient/caregiver able to verbalize understanding.  Discharge Medication: Allergies as of 12/22/2020   No Known Allergies      Medication List     STOP taking these medications    cephALEXin 500 MG capsule Commonly known as: KEFLEX   lisinopril 5 MG tablet Commonly known as: ZESTRIL   lisinopril-hydrochlorothiazide 20-12.5 MG tablet Commonly known as: ZESTORETIC       TAKE these medications    carvedilol 25 MG tablet Commonly known as: COREG Take 1 tablet (25 mg total) by mouth 2 (two) times daily with a meal. What changed:  medication strength how much to take when to take this   [employer] Tabs Take 1 tablet by mouth daily.   ciprofloxacin 500 MG tablet Commonly known as: Cipro Take 1 tablet (500 mg total) by mouth 2 (two) times daily for 10 days.   dorzolamide-timolol 22.3-6.8 MG/ML ophthalmic solution Commonly known as: COSOPT Place 1 drop into both eyes 2 (two) times daily.   fluconazole 150 MG tablet Commonly known as: Diflucan Take 1 tablet (150 mg total) by mouth daily for 1 dose. For urinary symptoms, discharge on antibiotics   lactobacillus Pack Take 1 packet (1 g total) by mouth 3 (three) times daily with meals for 10 days.   potassium chloride 10 MEQ tablet Commonly known as: KLOR-CON Take 1 tablet (10 mEq total) by mouth daily for 7 days.   Rocklatan 0.02-0.005 % Soln Generic drug: Netarsudil-Latanoprost Apply 1 drop to eye daily.   senna 8.6 MG tablet Commonly known as: SENOKOT Take 1 tablet by mouth at bedtime.       ASK your doctor about these medications    clopidogrel 75 MG tablet Commonly known as: PLAVIX TAKE ONE TABLET BY MOUTH ONCE DAILY.   pantoprazole 40 MG tablet Commonly known as: PROTONIX TAKE (1) TABLET BY  MOUTH ONCE DAILY.   rosuvastatin 20 MG tablet Commonly known as: CRESTOR TAKE (1) TABLET BY MOUTH AT BEDTIME.        Discharge Assessment: Vitals:   12/22/20 0517 12/22/20 0851  BP: (!) 167/71 (!) 182/76  Pulse: 73 74  Resp: 19   Temp: 98.5 F (36.9 C)   SpO2: 93%    Skin clean, dry and intact without evidence of skin break down, no evidence of skin tears noted. IV catheter discontinued intact. Site without signs and symptoms of complications - no redness or edema noted at insertion site, patient denies c/o pain - only slight tenderness at site.  Dressing with slight pressure applied.  D/c Instructions-Education: Discharge instructions given to patient/family with verbalized understanding. D/c education completed with patient/family including follow up instructions, medication list, d/c activities limitations if indicated, with other d/c instructions as indicated by MD - patient able to verbalize understanding, all questions fully answered. Patient instructed to return to ED, call 911, or call MD for any changes in condition.  Patient escorted via [place], and D/C home via private auto.  Dorcas Mcmurray, LPN 4/59/9774 14:23 PM

## 2020-12-23 ENCOUNTER — Telehealth: Payer: Self-pay | Admitting: *Deleted

## 2020-12-23 NOTE — Telephone Encounter (Signed)
Post ED Visit - Positive Culture Follow-up  Culture report reviewed by antimicrobial stewardship pharmacist: [place] Team []  Elenor Quinones, Pharm.D. []  Heide Guile, Pharm.D., BCPS AQ-ID []  Parks Neptune, Pharm.D., BCPS []  Alycia Rossetti, Pharm.D., BCPS []  [place], [place].D., BCPS, AAHIVP []  Legrand Como, Pharm.D., BCPS, AAHIVP [x]  Salome Arnt, PharmD, BCPS []  Johnnette Gourd, PharmD, BCPS []  Hughes Better, PharmD, BCPS []  Leeroy Cha, PharmD []  Laqueta Linden, PharmD, BCPS []  Albertina Parr, PharmD  [place] Team []  Leodis Sias, PharmD []  Lindell Spar, PharmD []  Royetta Asal, PharmD []  Graylin Shiver, Rph []  Rema Fendt) Glennon Mac, PharmD []  Arlyn Dunning, PharmD []  Netta Cedars, PharmD []  Dia Sitter, PharmD []  Leone Haven, PharmD []  Gretta Arab, PharmD []  Theodis Shove, PharmD []  Peggyann Juba, PharmD []  Reuel Boom, PharmD   Positive urine culture Treated with Cipro, organism sensitive to the same and no further patient follow-up is required at this time.    Harlon Flor [facility] 12/23/2020, 11:06 AM

## 2020-12-25 LAB — CULTURE, BLOOD (ROUTINE X 2)
Culture: NO GROWTH
Culture: NO GROWTH
Special Requests: ADEQUATE
Special Requests: ADEQUATE

## 2021-01-03 ENCOUNTER — Encounter: Payer: Self-pay | Admitting: Family Medicine

## 2021-01-03 ENCOUNTER — Ambulatory Visit (INDEPENDENT_AMBULATORY_CARE_PROVIDER_SITE_OTHER): Payer: PPO | Admitting: Family Medicine

## 2021-01-03 ENCOUNTER — Other Ambulatory Visit: Payer: Self-pay

## 2021-01-03 VITALS — BP 144/80 | HR 60 | Temp 98.5°F | Resp 14 | Ht 63.0 in | Wt 159.0 lb

## 2021-01-03 DIAGNOSIS — N390 Urinary tract infection, site not specified: Secondary | ICD-10-CM

## 2021-01-03 DIAGNOSIS — N1832 Chronic kidney disease, stage 3b: Secondary | ICD-10-CM | POA: Diagnosis not present

## 2021-01-03 DIAGNOSIS — I10 Essential (primary) hypertension: Secondary | ICD-10-CM

## 2021-01-03 DIAGNOSIS — E8809 Other disorders of plasma-protein metabolism, not elsewhere classified: Secondary | ICD-10-CM | POA: Diagnosis not present

## 2021-01-03 DIAGNOSIS — E46 Unspecified protein-calorie malnutrition: Secondary | ICD-10-CM | POA: Diagnosis not present

## 2021-01-03 DIAGNOSIS — Z8673 Personal history of transient ischemic attack (TIA), and cerebral infarction without residual deficits: Secondary | ICD-10-CM

## 2021-01-03 MED ORDER — LISINOPRIL 10 MG PO TABS
10.0000 mg | ORAL_TABLET | Freq: Every day | ORAL | 3 refills | Status: DC
Start: 2021-01-03 — End: 2021-07-04

## 2021-01-03 NOTE — Progress Notes (Signed)
Subjective:    Patient ID: [name], female    DOB: March 08, 1935, 86 y.o.   MRN: [id number]  HPI  Patient was admitted July 17 through the 20th with E. coli bacteremia.  I have copied discharge summary below for my reference:  Discharge Diagnoses:      Principal Problem:   Sepsis due to Escherichia coli (E. coli) -from urinary source--POA Active Problems:   Hypertension   UTI (urinary tract infection)   Leukocytosis   Cerebrovascular disease   Hypokalemia   Hypoalbuminemia due to protein-calorie malnutrition (HCC)   Obesity (BMI 30.0-34.9)   CKD (chronic kidney disease), stage III (HCC)   Prolonged QT interval   Hyperlipidemia     History of Present Illness/ Hospital Course [name] Summary:        Brief Summary:_ 86 y.o. female with medical history significant for cerebrovascular disease s/p CVA in 2016, hypertension, hyperlipidemia, GERD, glaucoma admitted on 12/20/2020 with E. coli sepsis secondary to presumed urinary source     A/p 1) E. coli sepsis secondary to presumed urinary source----POA -patient met sepsis criteria on admission with leukocytosis (wbc 20.0) , fevers (101.7)  and tachypnea (RR 30) -Lactic acid was 1.3 -Blood cultures from 12/19/2020 and urine culture from 12/20/2020 growing E. Coli-pansensitive -Continue IV Rocephin 2 g daily pending sensitivity of E. Coli 05/11/2021 -Switch to p.o. ciprofloxacin for 10 more days   2) hypokalemia--most likely due to HCTZ use, replaced potassium and hold HCTZ, Norvasc added   3) history of prior CVA--- okay to continue clopidogrel and Crestor   4)HTN-hold lisinopril HCTZ due to hypokalemia and UTI with AKI on CKD concerns -Okay to use amlodipine and Coreg for BP control     5) generalized weakness/deconditioning----PT eval appreciated, recommends home health PT/OT    6) CKD stage - 3B -Renal function is close to baseline, hold lisinopril HCTZ given sepsis and UTI --renally adjust medications, avoid  nephrotoxic agents / dehydration  / hypotension     --Plan of care discussed with patient and her husband [name])  at bedside, questions answered   Patient is here today for follow-up.  She is accompanied by her husband.  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  Therefore it is difficult for her to provide accurate history.  Our conversation is primarily limited to yes and no questions however her husband supplements the history.  Since discharge from the hospital, she denies any dysuria.  She denies any pelvic pain.  She denies any cough.  She denies any shortness of breath.  She denies any pleurisy.  She does have some mild right leg swelling slightly asymmetric to the left leg however this appears to be a chronic finding.  There is no pitting edema in either leg.  There is no erythema or warmth.  She has now completed potassium.  She has also completed Cipro.  She has been off medications over the weekend.  She has had no residual fever or chills or signs of bacteremia.  She is not on any long-term potassium replacement although she continues to hold lisinopril hydrochlorothiazide.  At the present time she is only on carvedilol for hypertension although they have increased the dose to 25 mg twice daily.  She did not resume amlodipine at discharge from the hospital. 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    Constipation    Degenerative joint disease of knee, right    right knee  Hearing impaired person    wears hearing aids   High cholesterol    History of blood transfusion    Hx: UTI (urinary tract infection)    Hypertension    Kidney stones    Stroke [facility])    speech is effected slightly , right sided weakness  (has complete obstruction left carotid - per husband)   Past Surgical History:  Procedure Laterality Date   APPENDECTOMY     ESOPHAGEAL DILATION N/A 07/11/2018   Procedure: ESOPHAGEAL DILATION;  Surgeon: Rogene Houston, MD;  [facility];  Service: Endoscopy;  Laterality: N/A;   ESOPHAGOGASTRODUODENOSCOPY N/A 04/28/2018   Procedure: ESOPHAGOGASTRODUODENOSCOPY (EGD);  Surgeon: Rogene Houston, MD;  [facility];  Service: Endoscopy;  Laterality: N/A;   ESOPHAGOGASTRODUODENOSCOPY N/A 07/11/2018   Procedure: ESOPHAGOGASTRODUODENOSCOPY (EGD);  Surgeon: Rogene Houston, MD;  [facility];  Service: Endoscopy;  Laterality: N/A;  1200   HARDWARE REMOVAL Right 10/05/2014   Procedure: HARDWARE REMOVAL;  Surgeon: Dorna Leitz, MD;  Location: [place];  Service: Orthopedics;  Laterality: Right;   INTRAMEDULLARY (IM) NAIL INTERTROCHANTERIC Right 06/12/2014   Procedure: INTRAMEDULLARY (IM) NAIL INTERTROCHANTRIC RIGHT HIP;  Surgeon: Alta Corning, MD;  Location: [place];  Service: Orthopedics;  Laterality: Right;   kidney stones     LITHOTRIPSY     PEG PLACEMENT     and removal   PEG TUBE REMOVAL     RADIOLOGY WITH ANESTHESIA  04/22/2012   Procedure: RADIOLOGY WITH ANESTHESIA;  Surgeon: Rob Hickman, MD;  Location: [place];  Service: Radiology;  Laterality: N/A;  VERTEBRAL ARTERY STENT PLACEMENT AND CEREBRAL ANGIOGRAM   TONSILLECTOMY     TYMPANOPLASTY     Current Outpatient Medications on File Prior to Visit  Medication Sig Dispense Refill   carvedilol (COREG) 25 MG tablet Take 1 tablet (25 mg total) by mouth 2 (two) times daily with a meal. 60 tablet 2   clopidogrel (PLAVIX) 75 MG tablet TAKE ONE TABLET BY MOUTH ONCE DAILY. (Patient taking differently: Take 75 mg by mouth daily.) 90 tablet 0   dorzolamide-timolol (COSOPT) 22.3-6.8 MG/ML ophthalmic solution Place 1 drop into both eyes 2 (two) times daily.     Multiple Vitamins-Minerals (CENTRUM ULTRA WOMENS) TABS Take 1 tablet by mouth daily.       ROCKLATAN 0.02-0.005 % SOLN Apply 1 drop to eye daily.     rosuvastatin (CRESTOR) 20 MG tablet TAKE (1) TABLET BY MOUTH AT BEDTIME. (Patient taking differently: Take 20 mg by mouth  daily.) 90 tablet 0   senna (SENOKOT) 8.6 MG tablet Take 1 tablet by mouth at bedtime.      pantoprazole (PROTONIX) 40 MG tablet TAKE (1) TABLET BY MOUTH ONCE DAILY. (Patient not taking: Reported on 01/03/2021) 90 tablet 1   No current facility-administered medications on file prior to visit.   No Known Allergies  Social History   Socioeconomic History   Marital status: Married    Spouse name: Not on file   Number of children: Not on file   Years of education: Not on file   Highest education level: Not on file  Occupational History   Not on file  Tobacco Use   Smoking status: Former    Types: Cigarettes    Quit date: 10/02/1983    Years since quitting: 37.2   Smokeless tobacco: Never  Vaping Use   Vaping Use: Never used  Substance and Sexual Activity   Alcohol use: Not Currently  Comment: occasional wine   Drug use: No   Sexual activity: Not Currently  Other Topics Concern   Not on file  Social History Narrative   Not on file   Social Determinants of Health   Financial Resource Strain: Not on file  Food Insecurity: Not on file  Transportation Needs: Not on file  Physical Activity: Not on file  Stress: Not on file  Social Connections: Not on file  Intimate Partner Violence: Not on file     Review of Systems  All other systems reviewed and are negative.     Objective:   Physical Exam Vitals reviewed.  Constitutional:      General: She is not in acute distress.    Appearance: Normal appearance. She is normal weight. She is not ill-appearing or toxic-appearing.  Cardiovascular:     Rate and Rhythm: Normal rate and regular rhythm.     Pulses: Normal pulses.     Heart sounds: Normal heart sounds. No murmur heard.   No friction rub. No gallop.  Pulmonary:     Effort: Pulmonary effort is normal. No respiratory distress.     Breath sounds: Normal breath sounds. No stridor. No wheezing, rhonchi or rales.  Abdominal:     General: Abdomen is flat. Bowel sounds  are normal.     Palpations: Abdomen is soft.  Musculoskeletal:     Right lower leg: No edema.     Left lower leg: No edema.  Neurological:     Mental Status: She is alert.          Assessment & Plan:  Urinary tract infection without hematuria, site unspecified - Plan: CBC with Differential/Platelet, COMPLETE METABOLIC PANEL WITH GFR, Urine Culture, Culture, blood (single) w Reflex to ID Panel  Stage 3b chronic kidney disease (HCC) - Plan: CBC with Differential/Platelet, COMPLETE METABOLIC PANEL WITH GFR, Urine Culture  History of CVA (cerebrovascular accident)  Essential hypertension  Hypoalbuminemia due to protein-calorie malnutrition (HCC) Clinically, the patient appears to be back to her baseline.  Her blood pressure slightly elevated today.  I would like to see her blood pressure better.  Also given her history of stage IIIb chronic kidney disease, I feel that the patient would benefit from taking an ACE inhibitor.  Therefore I would like her to resume lisinopril albeit at a lower dose 10 mg a day.  Her husband will continue to monitor her blood pressure at home and we will target a systolic blood pressure in the 130's.  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  I will recheck her potassium and if normal today, I do not feel that she requires long-term replacement as I plan to keep her off the hydrochlorothiazide unless her blood pressure dictates otherwise.  She is on Plavix for prevention of stroke.  She is on rosuvastatin for prevention of stroke.  I will repeat a urine culture and a blood culture given her inability to provide a thorough history just to ensure resolution however clinically she appears to be well

## 2021-01-04 LAB — URINE CULTURE
MICRO NUMBER:: 12185636
Result:: NO GROWTH
SPECIMEN QUALITY:: ADEQUATE

## 2021-01-09 LAB — CULTURE, BLOOD (SINGLE): MICRO NUMBER:: 12185629

## 2021-01-09 LAB — COMPLETE METABOLIC PANEL WITH GFR
AG Ratio: 1.3 (calc) (ref 1.0–2.5)
ALT: 10 U/L (ref 6–29)
AST: 18 U/L (ref 10–35)
Albumin: 3.5 g/dL — ABNORMAL LOW (ref 3.6–5.1)
Alkaline phosphatase (APISO): 71 U/L (ref 37–153)
BUN/Creatinine Ratio: 11 (calc) (ref 6–22)
BUN: 18 mg/dL (ref 7–25)
CO2: 24 mmol/L (ref 20–32)
Calcium: 8.5 mg/dL — ABNORMAL LOW (ref 8.6–10.4)
Chloride: 110 mmol/L (ref 98–110)
Creat: 1.68 mg/dL — ABNORMAL HIGH (ref 0.60–0.95)
Globulin: 2.6 g/dL (calc) (ref 1.9–3.7)
Glucose, Bld: 105 mg/dL — ABNORMAL HIGH (ref 65–99)
Potassium: 4.6 mmol/L (ref 3.5–5.3)
Sodium: 143 mmol/L (ref 135–146)
Total Bilirubin: 0.4 mg/dL (ref 0.2–1.2)
Total Protein: 6.1 g/dL (ref 6.1–8.1)
eGFR: 30 mL/min/{1.73_m2} — ABNORMAL LOW (ref >=60)

## 2021-01-09 LAB — CBC WITH DIFFERENTIAL/PLATELET
Absolute Monocytes: 634 cells/uL (ref 200–950)
Basophils Absolute: 130 cells/uL (ref 0–200)
Basophils Relative: 1.8 %
Eosinophils Absolute: 360 cells/uL (ref 15–500)
Eosinophils Relative: 5 %
HCT: 38.8 % (ref 35.0–45.0)
Hemoglobin: 12.2 g/dL (ref 11.7–15.5)
Lymphs Abs: 1397 cells/uL (ref 850–3900)
MCH: 26.3 pg — ABNORMAL LOW (ref 27.0–33.0)
MCHC: 31.4 g/dL — ABNORMAL LOW (ref 32.0–36.0)
MCV: 83.6 fL (ref 80.0–100.0)
MPV: 10.3 fL (ref 7.5–12.5)
Monocytes Relative: 8.8 %
Neutro Abs: 4680 cells/uL (ref 1500–7800)
Neutrophils Relative %: 65 %
Platelets: 345 10*3/uL (ref 140–400)
RBC: 4.64 10*6/uL (ref 3.80–5.10)
RDW: 12.4 % (ref 11.0–15.0)
Total Lymphocyte: 19.4 %
WBC: 7.2 10*3/uL (ref 3.8–10.8)

## 2021-02-01 ENCOUNTER — Ambulatory Visit (INDEPENDENT_AMBULATORY_CARE_PROVIDER_SITE_OTHER): Payer: PPO | Admitting: *Deleted

## 2021-02-01 DIAGNOSIS — Z23 Encounter for immunization: Secondary | ICD-10-CM | POA: Diagnosis not present

## 2021-02-08 ENCOUNTER — Telehealth: Payer: Self-pay

## 2021-02-08 NOTE — Telephone Encounter (Signed)
Patient was seen by PCP after CXR performed and discussed at that time.

## 2021-02-08 NOTE — Telephone Encounter (Signed)
Pt's spouse stated the pt has been in the hospital, and had some xrays done, and since d/c from the hospital has received a letter stating that they would need to discuss results with pcp. Please advise   Cb #: [phone]

## 2021-02-09 ENCOUNTER — Other Ambulatory Visit: Payer: Self-pay | Admitting: Family Medicine

## 2021-02-09 DIAGNOSIS — Z8673 Personal history of transient ischemic attack (TIA), and cerebral infarction without residual deficits: Secondary | ICD-10-CM

## 2021-02-09 DIAGNOSIS — I10 Essential (primary) hypertension: Secondary | ICD-10-CM

## 2021-02-14 DIAGNOSIS — H401112 Primary open-angle glaucoma, right eye, moderate stage: Secondary | ICD-10-CM | POA: Diagnosis not present

## 2021-02-14 DIAGNOSIS — H401123 Primary open-angle glaucoma, left eye, severe stage: Secondary | ICD-10-CM | POA: Diagnosis not present

## 2021-03-04 ENCOUNTER — Telehealth: Payer: Self-pay | Admitting: Family Medicine

## 2021-03-04 NOTE — Chronic Care Management (AMB) (Signed)
  Chronic Care Management   Note  03/04/2021 Name: ADAIRA CENTOLA MRN: [id number] DOB: [birth year]  [name] is a 86 y.o. year old female who is a primary care patient of Pickard, Cammie Mcgee, MD. I reached out to Pasty Spillers by phone today in response to a referral sent by [name]'s PCP, Susy Frizzle, MD.   [name] was given information about Chronic Care Management services today including:  CCM service includes personalized support from designated clinical staff supervised by her physician, including individualized plan of care and coordination with other care providers 24/7 contact phone numbers for assistance for urgent and routine care needs. Service will only be billed when office clinical staff spend 20 minutes or more in a month to coordinate care. Only one practitioner may furnish and bill the service in a calendar month. The patient may stop CCM services at any time (effective at the end of the month) by phone call to the office staff.   [name]  verbally agreed to assistance and services provided by embedded care coordination/care management team today.  Follow up plan:   [place] Secretary/administrator

## 2021-03-15 DIAGNOSIS — L603 Nail dystrophy: Secondary | ICD-10-CM | POA: Diagnosis not present

## 2021-03-18 ENCOUNTER — Other Ambulatory Visit: Payer: Self-pay | Admitting: Family Medicine

## 2021-03-29 ENCOUNTER — Telehealth: Payer: Self-pay | Admitting: Pharmacist

## 2021-03-29 NOTE — Progress Notes (Signed)
Chronic Care Management Pharmacy Note  03/31/2021 Name:  [name] MRN:  [id number] DOB:  [birth year]  Summary: Initial PharmD visit.  Reviewed meds, addressed cost concerns.  All meds up to date and patient reports doing well overall.  Recommendations/Changes made from today's visit: None noted - does request help with Rocklatan copay  Plan: FU 6 months   Subjective: [name] is an 86 y.o. year old female who is a primary patient of Pickard, Priscille Heidelberg, MD.  The CCM team was consulted for assistance with disease management and care coordination needs.    Engaged with patient by telephone for initial visit in response to provider referral for pharmacy case management and/or care coordination services.   Consent to Services:  The patient was given the following information about Chronic Care Management services today, agreed to services, and gave verbal consent: 1. CCM service includes personalized support from designated clinical staff supervised by the primary care provider, including individualized plan of care and coordination with other care providers 2. 24/7 contact phone numbers for assistance for urgent and routine care needs. 3. Service will only be billed when office clinical staff spend 20 minutes or more in a month to coordinate care. 4. Only one practitioner may furnish and bill the service in a calendar month. 5.The patient may stop CCM services at any time (effective at the end of the month) by phone call to the office staff. 6. The patient will be responsible for cost sharing (co-pay) of up to 20% of the service fee (after annual deductible is met). Patient agreed to services and consent obtained.  Patient Care Team: Donita Brooks, MD as PCP - General (Family Medicine) Erroll Luna, [facility] as Pharmacist (Pharmacist)  Recent office visits:  01/03/21 Dr. Tanya Nones For hospital follow-up. STARTED Lisinopril 10 mg daily.    Recent consult visits:   09/28/20 Otolaryngology Susy Frizzle, MD. For Cerumen Impaction.No medication changes.   Hospital visits:  12/19/20 [facility] ( 3 Days) Humberto Leep, MD. For sepsis due to [facility] urinary. STOPPED Cephalexin, Lisinopril, and Lisinopril-Hydrochlorothiazide. CHANGED Carvedilol to 25 mg 1 tablet two times daily.   12/19/20 [facility] ( 5 Hours) Eber Hong, MD. For Lower urinary tract infectious disease. STARTED Cephalexin 500 mg 3 times daily and Potassium Chloride 10 MEQ daily.    Medication History: Rosuvastatin 20 mg 02/10/21 90 DS Lisinopril 10 mg 01/03/21 90 DS   Objective:  Lab Results  Component Value Date   CREATININE 1.68 (H) 01/03/2021   BUN 18 01/03/2021   GFRNONAA 35 (L) 12/22/2020   GFRAA 26 (L) 10/08/2019   NA 143 01/03/2021   K 4.6 01/03/2021   CALCIUM 8.5 (L) 01/03/2021   CO2 24 01/03/2021   GLUCOSE 105 (H) 01/03/2021    Lab Results  Component Value Date/Time   HGBA1C  12/09/2008 05:55 AM    5.4 (NOTE) The ADA recommends the following therapeutic goal for glycemic control related to Hgb A1c measurement: Goal of therapy: <6.5 Hgb A1c  Reference: American Diabetes Association: Clinical Practice Recommendations 2010, Diabetes Care, 2010, 33: (Suppl  1).   HGBA1C  11/24/2008 06:07 AM    5.6 (NOTE) The ADA recommends the following therapeutic goal for glycemic control related to Hgb A1c measurement: Goal of therapy: <6.5 Hgb A1c  Reference: American Diabetes Association: Clinical Practice Recommendations 2010, Diabetes Care, 2010, 33: (Suppl  1).    Last diabetic Eye exam: No results found for: HMDIABEYEEXA  Last diabetic Foot exam: No results found for: HMDIABFOOTEX   Lab Results  Component Value Date   CHOL 163 10/08/2019   HDL 48 (L) 10/08/2019   LDLCALC 88 10/08/2019   TRIG 172 (H) 10/08/2019   CHOLHDL 3.4 10/08/2019    Hepatic Function Latest Ref Rng & Units 01/03/2021 12/20/2020 12/20/2020  Total Protein 6.1 - 8.1  g/dL 6.1 5.3(L) 6.0(L)  Albumin 3.5 - 5.0 g/dL - 2.7(L) 3.0(L)  AST 10 - 35 U/L _0 ALT 6 - 29 U/L _1 Alk Phosphatase 38 - 126 U/L - 61 70  Total Bilirubin 0.2 - 1.2 mg/dL 0.4 0.6 1.0    Lab Results  Component Value Date/Time   TSH 1.54 10/08/2019 08:42 AM   TSH 0.732 04/26/2018 09:21 AM    CBC Latest Ref Rng & Units 01/03/2021 12/22/2020 12/21/2020  WBC 3.8 - 10.8 Thousand/uL 7.2 6.9 11.1(H)  Hemoglobin 11.7 - 15.5 g/dL 12.2 11.1(L) 11.0(L)  Hematocrit 35.0 - 45.0 % 38.8 35.6(L) 35.5(L)  Platelets 140 - 400 Thousand/uL 345 175 169    No results found for: VD25OH  Clinical ASCVD: Yes  The ASCVD Risk score (Arnett DK, et al., 2019) failed to calculate for the following reasons:   The 2019 ASCVD risk score is only valid for ages 52 to 64    Depression screen PHQ 2/9 12/02/2019 10/06/2019  Decreased Interest 0 0  Down, Depressed, Hopeless 0 0  PHQ - 2 Score 0 0      Social History   Tobacco Use  Smoking Status Former   Types: Cigarettes   Quit date: 10/02/1983   Years since quitting: 37.5  Smokeless Tobacco Never   BP Readings from Last 3 Encounters:  01/03/21 (!) 144/80  12/22/20 (!) 182/76  12/19/20 (!) 141/76   Pulse Readings from Last 3 Encounters:  01/03/21 60  12/22/20 74  12/19/20 79   Wt Readings from Last 3 Encounters:  01/03/21 159 lb (72.1 kg)  12/20/20 168 lb 3.4 oz (76.3 kg)  12/19/20 175 lb (79.4 kg)   BMI Readings from Last 3 Encounters:  01/03/21 28.17 kg/m  12/20/20 30.77 kg/m  12/19/20 32.01 kg/m    Assessment/Interventions: Review of patient past medical history, allergies, medications, health status, including review of consultants reports, laboratory and other test data, was performed as part of comprehensive evaluation and provision of chronic care management services.   SDOH:  (Social Determinants of Health) assessments and interventions performed: Yes  Financial Resource Strain: Low Risk    Difficulty of Paying Living  Expenses: Not very hard    SDOH Screenings   Alcohol Screen: Not on file  Depression (PHQ2-9): Not on file  Financial Resource Strain: Low Risk    Difficulty of Paying Living Expenses: Not very hard  Food Insecurity: Not on file  Housing: Not on file  Physical Activity: Not on file  Social Connections: Not on file  Stress: Not on file  Tobacco Use: Medium Risk   Smoking Tobacco Use: Former   Smokeless Tobacco Use: Never   Passive Exposure: Not on file  Transportation Needs: Not on file    [place]  No Known Allergies  Medications Reviewed Today     Reviewed by Six, Eden Lathe, LPN (Licensed Practical Nurse) on 01/03/21 at 42  Med List Status: <None>   Medication Order Taking? Sig Documenting Provider Last Dose Status Informant  carvedilol (COREG) 25 MG tablet [id number] Yes Take 1 tablet (25 mg total)  by mouth 2 (two) times daily with a meal. Shahmehdi, Seyed A, MD Taking Active   clopidogrel (PLAVIX) 75 MG tablet [id number] Yes TAKE ONE TABLET BY MOUTH ONCE DAILY.  Patient taking differently: Take 75 mg by mouth daily.   Susy Frizzle, MD Taking Active   dorzolamide-timolol (COSOPT) 22.3-6.8 MG/ML ophthalmic solution [id number] Yes Place 1 drop into both eyes 2 (two) times daily. [provider] Taking Active Spouse/Significant Other  Multiple Vitamins-Minerals (CENTRUM ULTRA WOMENS) TABS [id number] Yes Take 1 tablet by mouth daily.   [provider] Taking Active Spouse/Significant Other  pantoprazole (PROTONIX) 40 MG tablet [id number] No TAKE (1) TABLET BY MOUTH ONCE DAILY.  Patient not taking: Reported on 01/03/2021   Rogene Houston, MD Not Taking Active Spouse/Significant Other  ROCKLATAN 0.02-0.005 % SOLN [id number] Yes Apply 1 drop to eye daily. [provider] Taking Active Spouse/Significant Other  rosuvastatin (CRESTOR) 20 MG tablet [id number] Yes TAKE (1) TABLET BY MOUTH AT BEDTIME.  Patient taking differently: Take 20 mg by mouth  daily.   Susy Frizzle, MD Taking Active Spouse/Significant Other  senna (SENOKOT) 8.6 MG tablet [id number] Yes Take 1 tablet by mouth at bedtime.  [provider] Taking Active Spouse/Significant Other            Patient Active Problem List   Diagnosis Date Noted   Sepsis due to Escherichia coli (E. coli) -from urinary source--POA 12/21/2020   Hypoalbuminemia due to protein-calorie malnutrition ([place]) 12/20/2020   Obesity (BMI 30.0-34.9) 12/20/2020   CKD (chronic kidney disease), stage III ([place]) 12/20/2020   Prolonged QT interval 12/20/2020   Hyperlipidemia 12/20/2020   Schatzki's ring 05/08/2018   PUD (peptic ulcer disease) 05/08/2018   Duodenal ulcer 04/29/2018   Acute renal failure (ARF) ([place]) 04/26/2018   Dehydration 04/26/2018   Anemia, unspecified 04/26/2018   Altered mental state 04/26/2018   Leukocytosis 04/26/2018   Cerebrovascular disease 04/26/2018   Hypokalemia 04/26/2018   Painful orthopaedic hardware ([place]) 10/05/2014   Right hip pain 10/05/2014   Expressive aphasia    Acute blood loss anemia    Acute renal failure syndrome ([place])    Closed right hip fracture ([place]) 06/12/2014   Hip fracture ([place]) 06/12/2014   UTI (urinary tract infection) 06/12/2014   Fall    Vasovagal syncope 05/15/2011   History of CVA (cerebrovascular accident) 05/15/2011   Hypertension 05/15/2011   LOWER LEG, ARTHRITIS, DEGEN./OSTEO 01/21/2009   JOINT EFFUSION, RIGHT KNEE 01/21/2009   KNEE PAIN 01/21/2009    Immunization History  Administered Date(s) Administered   Influenza, High Dose Seasonal PF 03/28/2018, 03/04/2019   Influenza-Unspecified 04/14/2020   PFIZER(Purple Top)SARS-COV-2 Vaccination 07/03/2019, 07/25/2019, 05/21/2020   Pneumococcal Polysaccharide-23 02/01/2021   Zoster, Live 02/25/2016    Conditions to be addressed/monitored:  HTN, CKD, Hx of CVA, HLD  Care Plan : General Pharmacy (Adult)  Updates made by Edythe Clarity, RPH since 03/31/2021 12:00  AM     Problem: HTN, CKD, Hx of CVA, HLD   Priority: High  Onset Date: 03/31/2021     Long-Range Goal: Patient-Specific Goal   Start Date: 03/31/2021  Expected End Date: 09/29/2021  This Visit's Progress: On track  Priority: High  Note:   Current Barriers:  High copay with Rocklatan  Pharmacist Clinical Goal(s):  Patient will verbalize ability to afford treatment regimen maintain control of BP as evidenced by monitoring  through collaboration with PharmD and provider.   Interventions: 1:1 collaboration with Susy Frizzle, MD regarding development and update  of comprehensive plan of care as evidenced by provider attestation and co-signature Inter-disciplinary care team collaboration (see longitudinal plan of care) Comprehensive medication review performed; medication list updated in electronic medical record  Hypertension (BP goal <130/80) -Controlled -Current treatment: Carvedilol 51m BID Lisinopril 146mdaily -Medications previously tried: none noted -Current home readings:  -Current dietary habits: normal at home, no logs today -Current exercise habits: minimal, husband reports she is in need of knee replacement and the pain limits her ability to exercise -Denies hypotensive/hypertensive symptoms -Educated on BP goals and benefits of medications for prevention of heart attack, stroke and kidney damage; Importance of home blood pressure monitoring; Symptoms of hypotension and importance of maintaining adequate hydration; -Counseled to monitor BP at home periodically, document, and provide log at future appointments -Recommended to continue current medication  Hyperlipidemia/: (LDL goal < 70) -Not ideally controlled -Current treatment: Rosuvastatin 2044maily -Medications previously tried: none noted  -Most recent LDL is 88 in May 2021.  Goal < 70 with Hx of Stroke. -Patient in stage 3b CKD - continue to monitor. -Recommended to continue current medication Could  consider switch to Atorvastatin in the future if renal function continues to decline.  Hx of CVA (Goal: Reduce CV risk) -Controlled -Current treatment  Clopidogrel 6m82mily -Medications previously tried: none noted -BP controlled, patient is on statin, would benefit from LDL < 70. -Denies any abnormal bleeding at this time  -Recommended to continue current medication Continue to monitor and address risk factors.  Patient Goals/Self-Care Activities Patient will:  - take medications as prescribed check blood pressure periodically, document, and provide at future appointments collaborate with provider on medication access solutions  Follow Up Plan: The care management team will reach out to the patient again over the next 180 days.       Medication Assistance: None required.  Patient affirms current coverage meets needs.  Compliance/Adherence/Medication fill history: Care Gaps: Dexa Scan  Star-Rating Drugs: Lisinopril 10mg32m26/22 90ds Rosuvastatin 02/10/21 90ds Patient[facility] [address] [place] [id number]: [phone] pill box? Yes - husband organizes Pt endorses 100% compliance  We discussed: Benefits of medication synchronization, packaging and delivery as well as enhanced pharmacist oversight with [employer]. Patient decided to: Continue current medication management strategy  Care Plan and Follow Up Patient Decision:  Patient agrees to Care Plan and Follow-up.  Plan: The care management team will reach out to the patient again over the next 180 days.  ChrisBeverly MilchrmD Clinical Pharmacist [place])[phone]

## 2021-03-29 NOTE — Progress Notes (Signed)
    Chronic Care Management Pharmacy Assistant   Name: KLOEY CAZAREZ  MRN: [id number] DOB: Jan 24, 1935  [name] is an 86 y.o. year old female who presents for his initial CCM visit with the clinical pharmacist.  Reason for Encounter: Chart Prep    Conditions to be addressed/monitored: HTN, CKD,HLD  Primary concerns for visit include: HTN  Recent office visits:  01/03/21 Dr. Dennard Schaumann For hospital follow-up. STARTED Lisinopril 10 mg daily.   Recent consult visits:  09/28/20 Otolaryngology Beckie Salts, MD. For Cerumen Impaction.No medication changes.  Hospital visits:  12/19/20 [facility] ( 3 Days) Chrissie Noa, MD. For sepsis due to [facility] urinary. STOPPED Cephalexin, Lisinopril, and Lisinopril-Hydrochlorothiazide. CHANGED Carvedilol to 25 mg 1 tablet two times daily.   12/19/20 [facility] ( 5 Hours) Noemi Chapel, MD. For Lower urinary tract infectious disease. STARTED Cephalexin 500 mg 3 times daily and Potassium Chloride 10 MEQ daily.   Medication History: Rosuvastatin 20 mg 02/10/21 90 DS Lisinopril 10 mg 01/03/21 90 DS  Medications: Outpatient Encounter Medications as of 03/29/2021  Medication Sig   carvedilol (COREG) 25 MG tablet TAKE (1) TABLET BY MOUTH TWICE DAILY.   clopidogrel (PLAVIX) 75 MG tablet TAKE ONE TABLET BY MOUTH ONCE DAILY. (Patient taking differently: Take 75 mg by mouth daily.)   dorzolamide-timolol (COSOPT) 22.3-6.8 MG/ML ophthalmic solution Place 1 drop into both eyes 2 (two) times daily.   lisinopril (ZESTRIL) 10 MG tablet Take 1 tablet (10 mg total) by mouth daily.   Multiple Vitamins-Minerals (CENTRUM ULTRA WOMENS) TABS Take 1 tablet by mouth daily.     pantoprazole (PROTONIX) 40 MG tablet TAKE (1) TABLET BY MOUTH ONCE DAILY. (Patient not taking: Reported on 01/03/2021)   ROCKLATAN 0.02-0.005 % SOLN Apply 1 drop to eye daily.   rosuvastatin (CRESTOR) 20 MG tablet TAKE (1) TABLET BY MOUTH AT BEDTIME.    senna (SENOKOT) 8.6 MG tablet Take 1 tablet by mouth at bedtime.    No facility-administered encounter medications on file as of 03/29/2021.    Have you seen any other providers since your last visit? Patients husband stated no.   Any changes in your medications or health? Patients husband stated no.   Any side effects from any medications? Patients husband stated no.   Do you have an symptoms or problems not managed by your medications? Patients husband stated no.   Any concerns about your health right now? Patients husband stated to stop the repeat of UTI's.   Has your provider asked that you check blood pressure, blood sugar, or follow special diet at home? Patients husband checks her blood pressure at home.  Do you get any type of exercise on a regular basis? Patients husband stated no.  Can you think of a goal you would like to reach for your health? Patients husband stated to stop the repeat of UTI's.  Do you have any problems getting your medications? Patients husband stated her Rocklatan has a very expensive copay.   Is there anything that you would like to discuss during the appointment? Patients husband stated no.   Please bring medications and supplements to appointment, patient reminded of her 03/31/21 at 8:30 AM. CALL the husband Cell #.  Follow-Up:Pharmacist Review  Charlann Lange, [place] Pharmacist Assistant [phone]

## 2021-03-31 ENCOUNTER — Ambulatory Visit (INDEPENDENT_AMBULATORY_CARE_PROVIDER_SITE_OTHER): Payer: PPO | Admitting: Pharmacist

## 2021-03-31 DIAGNOSIS — N1832 Chronic kidney disease, stage 3b: Secondary | ICD-10-CM

## 2021-03-31 DIAGNOSIS — E782 Mixed hyperlipidemia: Secondary | ICD-10-CM

## 2021-03-31 DIAGNOSIS — I10 Essential (primary) hypertension: Secondary | ICD-10-CM

## 2021-03-31 NOTE — Patient Instructions (Addendum)
Visit Information   Goals Addressed             This Visit's Progress    Track and Manage My Blood Pressure-Hypertension       Timeframe:  Long-Range Goal Priority:  High Start Date:  03/31/21                           Expected End Date:   09/29/21                    Follow Up Date 07/01/21    - check blood pressure weekly - choose a place to take my blood pressure (home, clinic or office, retail store) - write blood pressure results in a log or diary    Why is this important?   You won't feel high blood pressure, but it can still hurt your blood vessels.  High blood pressure can cause heart or kidney problems. It can also cause a stroke.  Making lifestyle changes like losing a little weight or eating less salt will help.  Checking your blood pressure at home and at different times of the day can help to control blood pressure.  If the doctor prescribes medicine remember to take it the way the doctor ordered.  Call the office if you cannot afford the medicine or if there are questions about it.     Notes:        Patient Care Plan: General Pharmacy (Adult)     Problem Identified: HTN, CKD, Hx of CVA, HLD   Priority: High  Onset Date: 03/31/2021     Long-Range Goal: Patient-Specific Goal   Start Date: 03/31/2021  Expected End Date: 09/29/2021  This Visit's Progress: On track  Priority: High  Note:   Current Barriers:  High copay with Rocklatan  Pharmacist Clinical Goal(s):  Patient will verbalize ability to afford treatment regimen maintain control of BP as evidenced by monitoring  through collaboration with PharmD and provider.   Interventions: 1:1 collaboration with Susy Frizzle, MD regarding development and update of comprehensive plan of care as evidenced by provider attestation and co-signature Inter-disciplinary care team collaboration (see longitudinal plan of care) Comprehensive medication review performed; medication list updated in electronic  medical record  Hypertension (BP goal <130/80) -Controlled -Current treatment: Carvedilol 25mg  BID Lisinopril 10mg  daily -Medications previously tried: none noted -Current home readings:  -Current dietary habits: normal at home, no logs today -Current exercise habits: minimal, husband reports she is in need of knee replacement and the pain limits her ability to exercise -Denies hypotensive/hypertensive symptoms -Educated on BP goals and benefits of medications for prevention of heart attack, stroke and kidney damage; Importance of home blood pressure monitoring; Symptoms of hypotension and importance of maintaining adequate hydration; -Counseled to monitor BP at home periodically, document, and provide log at future appointments -Recommended to continue current medication  Hyperlipidemia/: (LDL goal < 70) -Not ideally controlled -Current treatment: Rosuvastatin 20mg  daily -Medications previously tried: none noted  -Most recent LDL is 88 in May 2021.  Goal < 70 with Hx of Stroke. -Patient in stage 3b CKD - continue to monitor. -Recommended to continue current medication Could consider switch to Atorvastatin in the future if renal function continues to decline.  Hx of CVA (Goal: Reduce CV risk) -Controlled -Current treatment  Clopidogrel 75mg  daily -Medications previously tried: none noted -BP controlled, patient is on statin, would benefit from LDL < 70. -Denies any abnormal bleeding at  this time  -Recommended to continue current medication Continue to monitor and address risk factors.  Patient Goals/Self-Care Activities Patient will:  - take medications as prescribed check blood pressure periodically, document, and provide at future appointments collaborate with provider on medication access solutions  Follow Up Plan: The care management team will reach out to the patient again over the next 180 days.      [name] was given information about Chronic Care Management  services today including:  CCM service includes personalized support from designated clinical staff supervised by her physician, including individualized plan of care and coordination with other care providers 24/7 contact phone numbers for assistance for urgent and routine care needs. Standard insurance, coinsurance, copays and deductibles apply for chronic care management only during months in which we provide at least 20 minutes of these services. Most insurances cover these services at 100%, however patients may be responsible for any copay, coinsurance and/or deductible if applicable. This service may help you avoid the need for more expensive face-to-face services. Only one practitioner may furnish and bill the service in a calendar month. The patient may stop CCM services at any time (effective at the end of the month) by phone call to the office staff.  Patient agreed to services and verbal consent obtained.   The patient verbalized understanding of instructions, educational materials, and care plan provided today and agreed to receive a mailed copy of patient instructions, educational materials, and care plan.  Telephone follow up appointment with pharmacy team member scheduled for: 6 months  Edythe Clarity, [place]

## 2021-04-04 DIAGNOSIS — I10 Essential (primary) hypertension: Secondary | ICD-10-CM | POA: Diagnosis not present

## 2021-04-04 DIAGNOSIS — E782 Mixed hyperlipidemia: Secondary | ICD-10-CM

## 2021-04-21 ENCOUNTER — Other Ambulatory Visit: Payer: Self-pay | Admitting: Family Medicine

## 2021-04-22 DIAGNOSIS — I6522 Occlusion and stenosis of left carotid artery: Secondary | ICD-10-CM | POA: Diagnosis not present

## 2021-04-22 DIAGNOSIS — I69351 Hemiplegia and hemiparesis following cerebral infarction affecting right dominant side: Secondary | ICD-10-CM | POA: Diagnosis not present

## 2021-04-22 DIAGNOSIS — I129 Hypertensive chronic kidney disease with stage 1 through stage 4 chronic kidney disease, or unspecified chronic kidney disease: Secondary | ICD-10-CM | POA: Diagnosis not present

## 2021-04-22 DIAGNOSIS — Z515 Encounter for palliative care: Secondary | ICD-10-CM | POA: Diagnosis not present

## 2021-04-22 DIAGNOSIS — D692 Other nonthrombocytopenic purpura: Secondary | ICD-10-CM | POA: Diagnosis not present

## 2021-04-22 DIAGNOSIS — Z7902 Long term (current) use of antithrombotics/antiplatelets: Secondary | ICD-10-CM | POA: Diagnosis not present

## 2021-04-22 DIAGNOSIS — N184 Chronic kidney disease, stage 4 (severe): Secondary | ICD-10-CM | POA: Diagnosis not present

## 2021-05-09 DIAGNOSIS — E785 Hyperlipidemia, unspecified: Secondary | ICD-10-CM | POA: Diagnosis not present

## 2021-05-09 DIAGNOSIS — N184 Chronic kidney disease, stage 4 (severe): Secondary | ICD-10-CM | POA: Diagnosis not present

## 2021-05-09 DIAGNOSIS — Z515 Encounter for palliative care: Secondary | ICD-10-CM | POA: Diagnosis not present

## 2021-05-10 DIAGNOSIS — N390 Urinary tract infection, site not specified: Secondary | ICD-10-CM | POA: Diagnosis not present

## 2021-05-10 DIAGNOSIS — R062 Wheezing: Secondary | ICD-10-CM | POA: Diagnosis not present

## 2021-05-10 DIAGNOSIS — R059 Cough, unspecified: Secondary | ICD-10-CM | POA: Diagnosis not present

## 2021-05-12 ENCOUNTER — Other Ambulatory Visit: Payer: Self-pay

## 2021-05-12 ENCOUNTER — Encounter: Payer: Self-pay | Admitting: Nurse Practitioner

## 2021-05-12 ENCOUNTER — Ambulatory Visit (INDEPENDENT_AMBULATORY_CARE_PROVIDER_SITE_OTHER): Payer: PPO | Admitting: Nurse Practitioner

## 2021-05-12 VITALS — BP 132/78 | HR 73 | Temp 98.7°F | Ht 62.0 in | Wt 161.4 lb

## 2021-05-12 DIAGNOSIS — R399 Unspecified symptoms and signs involving the genitourinary system: Secondary | ICD-10-CM | POA: Diagnosis not present

## 2021-05-12 DIAGNOSIS — Z8711 Personal history of peptic ulcer disease: Secondary | ICD-10-CM | POA: Diagnosis not present

## 2021-05-12 LAB — URINALYSIS, ROUTINE W REFLEX MICROSCOPIC
Bilirubin Urine: NEGATIVE
Glucose, UA: NEGATIVE
Hyaline Cast: NONE SEEN /LPF
Ketones, ur: NEGATIVE
Nitrite: NEGATIVE
Specific Gravity, Urine: 1.015 (ref 1.001–1.035)
pH: 6 (ref 5.0–8.0)

## 2021-05-12 LAB — MICROSCOPIC MESSAGE

## 2021-05-12 MED ORDER — CEPHALEXIN 500 MG PO CAPS: 500.0000 mg | ORAL_CAPSULE | Freq: Four times a day (QID) | ORAL | 0 refills | Status: DC

## 2021-05-12 MED ORDER — PANTOPRAZOLE SODIUM 40 MG PO TBEC: DELAYED_RELEASE_TABLET | ORAL | 0 refills | Status: DC

## 2021-05-12 NOTE — Progress Notes (Signed)
Subjective:    Patient ID: [name], female    DOB: December 09, 1934, 86 y.o.   MRN: [id number]  HPI: [name] is a 86 y.o. female presenting with husband for urinary tract infection symptoms.  Chief Complaint  Patient presents with   Urinary Tract Infection    Uti currently on cipro but didn't have urine tested. Patients husband reports shes not eating like normal.    URINARY SYMPTOMS Husband provides entire history.  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  Reports symptoms of UTI started Monday night.  She was prescribed Ciprofloxacin by insurance provider and has about 2 days of this left.  Husband is concerned because her symptoms do not appear to be improving.  She is also not eating as well.  She has a history of kidney stones.   Duration: Monday night - days Dysuria: unable to tell Urinary frequency: yes Urgency: yes Small volume voids: no Urinary incontinence: no Foul odor: no Hematuria: no Abdominal pain: no Back pain: no Suprapubic pain/pressure: no Flank pain: no Fever: subjective low grade Vomiting: no Relief with cranberry juice: no Relief with pyridium: no Decreased appetite: yes Status: better Previous urinary tract infection: yes Recurrent urinary tract infection: no  Husband is also concerned because the nurse thought he heard crackles in her lungs earlier this week.  Wanted to get a chest x-ray but this did not end up happening.  Husband denies upper respiratory symptoms including coughing, sneezing, fevers, congestion.   Husband reports several years ago, had a bleeding ulcer.  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  Husband reports she stopped taking Protonix because she ran out of it and did not feel like she needed it anymore.  Husband reports he has been noticing she is being very particular about what she is eating.  Due to her stroke in the  past, she does have difficulty verbalizing any concerns.  She does answer some yes/no questions however husband reports most of the time she will just say that she is fine even if she is not.   No Known Allergies  Outpatient Encounter Medications as of 05/12/2021  Medication Sig   cephALEXin (KEFLEX) 500 MG capsule Take 1 capsule (500 mg total) by mouth every 6 (six) hours.   carvedilol (COREG) 25 MG tablet TAKE (1) TABLET BY MOUTH TWICE DAILY.   ciprofloxacin (CIPRO) 250 MG tablet Take 250 mg by mouth 2 (two) times daily.   clopidogrel (PLAVIX) 75 MG tablet TAKE ONE TABLET BY MOUTH ONCE DAILY. (Patient taking differently: Take 75 mg by mouth daily.)   dorzolamide-timolol (COSOPT) 22.3-6.8 MG/ML ophthalmic solution Place 1 drop into both eyes 2 (two) times daily.   FLUAD QUADRIVALENT 0.5 ML injection    lisinopril (ZESTRIL) 10 MG tablet Take 1 tablet (10 mg total) by mouth daily.   Multiple Vitamins-Minerals (CENTRUM ULTRA WOMENS) TABS Take 1 tablet by mouth daily.     pantoprazole (PROTONIX) 40 MG tablet TAKE (1) TABLET BY MOUTH ONCE DAILY.   ROCKLATAN 0.02-0.005 % SOLN Apply 1 drop to eye daily.   rosuvastatin (CRESTOR) 20 MG tablet TAKE (1) TABLET BY MOUTH AT BEDTIME.   senna (SENOKOT) 8.6 MG tablet Take 1 tablet by mouth at bedtime.    [DISCONTINUED] pantoprazole (PROTONIX) 40 MG tablet TAKE (1) TABLET BY MOUTH ONCE DAILY.   No facility-administered encounter medications on file as of 05/12/2021.  Patient Active Problem List   Diagnosis Date Noted   Sepsis due to Escherichia coli (E. coli) -from urinary source--POA 12/21/2020   Hypoalbuminemia due to protein-calorie malnutrition ([place]) 12/20/2020   Obesity (BMI 30.0-34.9) 12/20/2020   CKD (chronic kidney disease), stage III (HCC) 12/20/2020   Prolonged QT interval 12/20/2020   Hyperlipidemia 12/20/2020   Schatzki's ring 05/08/2018   PUD (peptic ulcer disease) 05/08/2018   Duodenal ulcer 04/29/2018   Acute renal failure (ARF)  ([place]) 04/26/2018   Dehydration 04/26/2018   Anemia, unspecified 04/26/2018   Altered mental state 04/26/2018   Leukocytosis 04/26/2018   Cerebrovascular disease 04/26/2018   Hypokalemia 04/26/2018   Painful orthopaedic hardware ([place]) 10/05/2014   Right hip pain 10/05/2014   Expressive aphasia    Acute blood loss anemia    Acute renal failure syndrome ([place])    Closed right hip fracture ([place]) 06/12/2014   Hip fracture ([place]) 06/12/2014   UTI (urinary tract infection) 06/12/2014   Fall    Vasovagal syncope 05/15/2011   History of CVA (cerebrovascular accident) 05/15/2011   Hypertension 05/15/2011   LOWER LEG, ARTHRITIS, DEGEN./OSTEO 01/21/2009   JOINT EFFUSION, RIGHT KNEE 01/21/2009   KNEE PAIN 01/21/2009    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 Interim medical history since our last visit reviewed.  Review of Systems Per HPI unless specifically indicated above     Objective:    BP 132/78   Pulse 73   Temp 98.7 F (37.1 C) (Oral)   Ht 5\' 2"  (1.575 m)   Wt 161 lb 6.4 oz (73.2 kg)   SpO2 97%   BMI 29.52 kg/m   Wt Readings from Last 3 Encounters:  05/12/21 161 lb 6.4 oz (73.2 kg)  01/03/21 159 lb (72.1 kg)  12/20/20 168 lb 3.4 oz (76.3 kg)    Physical Exam Vitals and nursing note reviewed.  Constitutional:      General: She is not in acute distress.    Appearance: Normal appearance. She is not toxic-appearing.  HENT:     Head: Normocephalic and atraumatic.   Cardiovascular:     Rate and Rhythm: Normal rate and regular rhythm.     Heart sounds: Normal heart sounds. No murmur heard. Pulmonary:     Effort: Pulmonary effort is normal. No respiratory distress.     Breath sounds: Normal breath sounds. No wheezing, rhonchi or rales.  Abdominal:     General: Abdomen is flat. Bowel sounds are normal. There is no distension.     Palpations: Abdomen is soft.     Tenderness: There is no right CVA tenderness or left CVA tenderness.  Skin:    General: Skin is warm and dry.     Coloration: Skin is not jaundiced or pale.     Findings: No erythema.  Neurological:     Mental Status: She is alert and oriented to person, place, and time.  Motor: No weakness.     Gait: Gait normal.  Psychiatric:        Mood and Affect: Mood normal.        Behavior: Behavior normal.        Thought Content: Thought content normal.        Judgment: Judgment normal.      Assessment & Plan:  1. UTI symptoms Acute.  UA today shows 3+ blood, 3+ protein, 2+ leukocyte esterase, 10-20 WBC, 3-10 RBC, moderate bacteria.  I query if UTI has recurred that is resistant to ciprofloxacin.  Stop Ciprofloxacin and start cephalexin 500 mg q6 hours for 5 days.  Will send urine for culture and sensitivity.  If symptoms worsen and/or nausea/vomiting and unable to keep down fluids, go to ER.    Lung sounds are clear today; I think her symptoms stem from the UTI and do not think she needs a chest x-ray today.  - Urinalysis, Routine w reflex microscopic - Urine Culture - cephALEXin (KEFLEX) 500 MG capsule; Take 1 capsule (500 mg total) by mouth every 6 (six) hours.  Dispense: 20 capsule; Refill: 0  2. History of peptic ulcer disease Question if peptic ulcer disease improved with PPI but has worsened.  Difficult to determine because patient is not able to give full history.  Will resume Protonix 40 mg daily and follow up with PCP in ~4-6 weeks to monitor for improvement in symptoms.  Return to  clinic sooner if no benefit.   - pantoprazole (PROTONIX) 40 MG tablet; TAKE (1) TABLET BY MOUTH ONCE DAILY.  Dispense: 90 tablet; Refill: 0    Follow up plan: Return in about 1 month (around 06/12/2021) for f/u with PCP.  20+ minutes spent with the patient and husband discussed plan of care and treatment.

## 2021-05-13 ENCOUNTER — Other Ambulatory Visit: Payer: Self-pay | Admitting: Family Medicine

## 2021-05-13 DIAGNOSIS — I10 Essential (primary) hypertension: Secondary | ICD-10-CM

## 2021-05-13 DIAGNOSIS — Z8673 Personal history of transient ischemic attack (TIA), and cerebral infarction without residual deficits: Secondary | ICD-10-CM

## 2021-05-13 LAB — URINE CULTURE
MICRO NUMBER:: 12732556
SPECIMEN QUALITY:: ADEQUATE

## 2021-05-16 ENCOUNTER — Telehealth: Payer: Self-pay

## 2021-05-16 DIAGNOSIS — Z8709 Personal history of other diseases of the respiratory system: Secondary | ICD-10-CM | POA: Diagnosis not present

## 2021-05-16 DIAGNOSIS — N390 Urinary tract infection, site not specified: Secondary | ICD-10-CM | POA: Diagnosis not present

## 2021-05-16 NOTE — Telephone Encounter (Signed)
-----   Message from Eulogio Bear, NP sent at 05/16/2021  4:00 AM EST ----- Please notify husband that urine culture showed some bacteria but not enough for sensitivities.  She should have stopped the Cipro and started Keflex.  Have him make an in person appointment with [place] if her symptoms are not improving.

## 2021-05-16 NOTE — Telephone Encounter (Signed)
Left message to follow up and discuss results.

## 2021-05-17 ENCOUNTER — Other Ambulatory Visit: Payer: Self-pay | Admitting: Family Medicine

## 2021-05-24 DIAGNOSIS — L603 Nail dystrophy: Secondary | ICD-10-CM | POA: Diagnosis not present

## 2021-05-24 DIAGNOSIS — I739 Peripheral vascular disease, unspecified: Secondary | ICD-10-CM | POA: Diagnosis not present

## 2021-05-31 ENCOUNTER — Other Ambulatory Visit: Payer: Self-pay | Admitting: Family Medicine

## 2021-05-31 DIAGNOSIS — Z8673 Personal history of transient ischemic attack (TIA), and cerebral infarction without residual deficits: Secondary | ICD-10-CM

## 2021-06-17 ENCOUNTER — Telehealth: Payer: Self-pay | Admitting: Pharmacist

## 2021-06-17 NOTE — Progress Notes (Signed)
Chronic Care Management Pharmacy Assistant   Name: [name]  MRN: [id number] DOB: 1934/09/19   Reason for Encounter: Disease State - Hypertension Call     Recent office visits:  04/04/21 Jenna Luo, MD - Family Medicine - Hypertension - No notes available.   Recent consult visits:  None noted.   Hospital visits: 12/19/20 - 12/22/20 Medication Reconciliation was completed by comparing discharge summary, patients EMR and Pharmacy list, and upon discussion with patient.  Admitted to the hospital on 12/19/20 due to Bacteriemia. Discharge date was 12/22/20. Discharged from [facility]?Medications Started at [facility] Discharge:?? ciprofloxacin (Cipro) fluconazole (Diflucan) lactobacillus  Medication Changes at Hospital Discharge: carvedilol (COREG)  Medications Discontinued at Hospital Discharge: cephALEXin 500 MG capsule (KEFLEX) lisinopril 5 MG tablet (ZESTRIL) lisinopril-hydrochlorothiazide 20-12.5 MG tablet (ZESTORETIC)  Medications that remain the same after Hospital Discharge:??  All other medications will remain the same.    Medications: Outpatient Encounter Medications as of 06/17/2021  Medication Sig   carvedilol (COREG) 25 MG tablet TAKE (1) TABLET BY MOUTH TWICE DAILY.   cephALEXin (KEFLEX) 500 MG capsule Take 1 capsule (500 mg total) by mouth every 6 (six) hours.   ciprofloxacin (CIPRO) 250 MG tablet Take 250 mg by mouth 2 (two) times daily.   clopidogrel (PLAVIX) 75 MG tablet TAKE ONE TABLET BY MOUTH ONCE DAILY.   dorzolamide-timolol (COSOPT) 22.3-6.8 MG/ML ophthalmic solution Place 1 drop into both eyes 2 (two) times daily.   FLUAD QUADRIVALENT 0.5 ML injection    lisinopril (ZESTRIL) 10 MG tablet Take 1 tablet (10 mg total) by mouth daily.   Multiple Vitamins-Minerals (CENTRUM ULTRA WOMENS) TABS Take 1 tablet by mouth daily.     pantoprazole (PROTONIX) 40 MG tablet TAKE (1) TABLET BY MOUTH ONCE DAILY.   ROCKLATAN 0.02-0.005 % SOLN  Apply 1 drop to eye daily.   rosuvastatin (CRESTOR) 20 MG tablet TAKE (1) TABLET BY MOUTH AT BEDTIME.   senna (SENOKOT) 8.6 MG tablet Take 1 tablet by mouth at bedtime.    No facility-administered encounter medications on file as of 06/17/2021.  Current antihypertensive regimen:  Carvedilol 25mg  BID Lisinopril 10mg  daily  How often are you checking your Blood Pressure?  Patient is checking her blood pressure once a week.  Current home BP readings: 130/80 yesterday 06/19/21   What recent interventions/DTPs have been made by any provider to improve Blood Pressure control since last CPP Visit:  Patient denied any changes in current regimen.    Any recent hospitalizations or ED visits since last visit with CPP?  Patient had an ED visit on 12/19/20 to  direct Admit due to Bacteremia.Discharged on 12/22/20.   What diet changes have been made to improve Blood Pressure Control?  Patient tries to be mindful of her salt intake.    What exercise is being done to improve your Blood Pressure Control?  Patient reports she tries to remain as active as possible.    Adherence Review: Is the patient currently on ACE/ARB medication? Yes Does the patient have >5 day gap between last estimated fill dates? No    Care Gaps  AWV: overdue Colonoscopy: upper EGD done 07/11/18 DM Eye Exam: N/A DM Foot Exam: N/A Microalbumin: N/A HbgAIC:N/A DEXA: unknown Mammogram: unknown  Star Rating Drugs: lisinopril (ZESTRIL) 10 MG tablet - last filled 03/30/21 90 days rosuvastatin (CRESTOR) 20 MG tablet - last filled 05/13/21 90 days   Future Appointments  Date Time Provider [place]  06/23/2021  3:30  PM Susy Frizzle, MD [facility] None  09/29/2021  2:15 PM [facility] PHARMACIST [facility] None    Jobe Gibbon, [facility] Clinical Pharmacist Assistant  [phone]

## 2021-06-23 ENCOUNTER — Other Ambulatory Visit: Payer: Self-pay

## 2021-06-23 ENCOUNTER — Encounter: Payer: Self-pay | Admitting: Family Medicine

## 2021-06-23 ENCOUNTER — Ambulatory Visit (INDEPENDENT_AMBULATORY_CARE_PROVIDER_SITE_OTHER): Payer: PPO | Admitting: Family Medicine

## 2021-06-23 VITALS — BP 168/82 | HR 77 | Temp 97.5°F | Resp 18 | Ht 62.0 in | Wt 174.0 lb

## 2021-06-23 DIAGNOSIS — I10 Essential (primary) hypertension: Secondary | ICD-10-CM

## 2021-06-23 DIAGNOSIS — N1832 Chronic kidney disease, stage 3b: Secondary | ICD-10-CM

## 2021-06-23 LAB — CBC WITH DIFFERENTIAL/PLATELET
Absolute Monocytes: 707 cells/uL (ref 200–950)
Basophils Absolute: 102 cells/uL (ref 0–200)
Basophils Relative: 1.5 %
Eosinophils Absolute: 340 cells/uL (ref 15–500)
Eosinophils Relative: 5 %
HCT: 37.3 % (ref 35.0–45.0)
Hemoglobin: 11.8 g/dL (ref 11.7–15.5)
Lymphs Abs: 1612 cells/uL (ref 850–3900)
MCH: 26.2 pg — ABNORMAL LOW (ref 27.0–33.0)
MCHC: 31.6 g/dL — ABNORMAL LOW (ref 32.0–36.0)
MCV: 82.7 fL (ref 80.0–100.0)
MPV: 9.8 fL (ref 7.5–12.5)
Monocytes Relative: 10.4 %
Neutro Abs: 4039 cells/uL (ref 1500–7800)
Neutrophils Relative %: 59.4 %
Platelets: 315 10*3/uL (ref 140–400)
RBC: 4.51 10*6/uL (ref 3.80–5.10)
RDW: 12.6 % (ref 11.0–15.0)
Total Lymphocyte: 23.7 %
WBC: 6.8 10*3/uL (ref 3.8–10.8)

## 2021-06-23 LAB — COMPLETE METABOLIC PANEL WITH GFR
AG Ratio: 1.3 (calc) (ref 1.0–2.5)
ALT: 6 U/L (ref 6–29)
AST: 14 U/L (ref 10–35)
Albumin: 3.3 g/dL — ABNORMAL LOW (ref 3.6–5.1)
Alkaline phosphatase (APISO): 83 U/L (ref 37–153)
BUN/Creatinine Ratio: 10 (calc) (ref 6–22)
BUN: 17 mg/dL (ref 7–25)
CO2: 30 mmol/L (ref 20–32)
Calcium: 8.5 mg/dL — ABNORMAL LOW (ref 8.6–10.4)
Chloride: 108 mmol/L (ref 98–110)
Creat: 1.65 mg/dL — ABNORMAL HIGH (ref 0.60–0.95)
Globulin: 2.5 g/dL (calc) (ref 1.9–3.7)
Glucose, Bld: 130 mg/dL — ABNORMAL HIGH (ref 65–99)
Potassium: 3.6 mmol/L (ref 3.5–5.3)
Sodium: 144 mmol/L (ref 135–146)
Total Bilirubin: 0.4 mg/dL (ref 0.2–1.2)
Total Protein: 5.8 g/dL — ABNORMAL LOW (ref 6.1–8.1)
eGFR: 30 mL/min/{1.73_m2} — ABNORMAL LOW (ref >=60)

## 2021-06-23 NOTE — Progress Notes (Signed)
Subjective:    Patient ID: [name], female    DOB: 1934-06-12, 87 y.o.   MRN: [id number]  HPI Patient has a history of stage IIIb chronic kidney disease as well as aphasia secondary to his stroke.  She also has a history of hypertension.  Her blood pressure today is significantly elevated.  I verified the accuracy of his blood pressure in both arms.  Patient's husband states that he is checking her blood pressure at home and his systolic blood pressure is typically around 140.  However today I was reporting blood pressure was near 025 systolic.  The patient is unable to contribute to the history.  She nods in agreement to every question.  She is extremely pleasant and sweet however she is unable to provide additional history due to her aphasia.  Her husband states that she recently had an upper respiratory infection due to a cough and some head congestion.  They deny any fever.  She denies any shortness of breath.  She denies any chest pain.  Her lungs are clear to auscultation bilaterally.  I do not appreciate any wheezes or crackles or rails.  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    Constipation    Degenerative joint disease of knee, right    right knee   Hearing impaired person    wears hearing aids   High cholesterol    History of blood transfusion    Hx: UTI (urinary tract infection)    Hypertension    Kidney stones    Stroke [facility])    speech is effected slightly , right sided weakness  (has complete obstruction left carotid - per husband)   Past Surgical History:  Procedure Laterality Date   APPENDECTOMY     ESOPHAGEAL DILATION N/A 07/11/2018   Procedure: ESOPHAGEAL DILATION;  Surgeon: Rogene Houston, MD;  [facility];  Service: Endoscopy;  Laterality: N/A;   ESOPHAGOGASTRODUODENOSCOPY N/A 04/28/2018   Procedure: ESOPHAGOGASTRODUODENOSCOPY (EGD);  Surgeon: Rogene Houston, MD;  [facility];  Service: Endoscopy;  Laterality: N/A;   ESOPHAGOGASTRODUODENOSCOPY N/A 07/11/2018   Procedure: ESOPHAGOGASTRODUODENOSCOPY (EGD);  Surgeon: Rogene Houston, MD;  [facility];  Service: Endoscopy;  Laterality: N/A;  1200   HARDWARE REMOVAL Right 10/05/2014   Procedure: HARDWARE REMOVAL;  Surgeon: Dorna Leitz, MD;  Location: [place];  Service: Orthopedics;  Laterality: Right;   INTRAMEDULLARY (IM) NAIL INTERTROCHANTERIC Right 06/12/2014   Procedure: INTRAMEDULLARY (IM) NAIL INTERTROCHANTRIC RIGHT HIP;  Surgeon: Alta Corning, MD;  Location: [place];  Service: Orthopedics;  Laterality: Right;   kidney stones     LITHOTRIPSY     PEG PLACEMENT     and removal   PEG TUBE REMOVAL     RADIOLOGY WITH ANESTHESIA  04/22/2012   Procedure: RADIOLOGY WITH ANESTHESIA;  Surgeon: Rob Hickman, MD;  Location: [place];  Service: Radiology;  Laterality: N/A;  VERTEBRAL ARTERY STENT PLACEMENT AND CEREBRAL ANGIOGRAM   TONSILLECTOMY     TYMPANOPLASTY     Current Outpatient Medications on File Prior to Visit  Medication Sig Dispense Refill   carvedilol (COREG) 25 MG tablet TAKE (1) TABLET BY MOUTH TWICE DAILY. 60 tablet 11   clopidogrel (PLAVIX) 75 MG tablet TAKE ONE TABLET BY MOUTH ONCE DAILY. 90 tablet 0   dorzolamide-timolol (COSOPT) 22.3-6.8 MG/ML ophthalmic solution Place 1 drop into both eyes 2 (  two) times daily.     lisinopril (ZESTRIL) 10 MG tablet Take 1 tablet (10 mg total) by mouth daily. 90 tablet 3   Multiple Vitamins-Minerals (CENTRUM ULTRA WOMENS) TABS Take 1 tablet by mouth daily.       pantoprazole (PROTONIX) 40 MG tablet TAKE (1) TABLET BY MOUTH ONCE DAILY. 90 tablet 0   ROCKLATAN 0.02-0.005 % SOLN Apply 1 drop to eye daily.     rosuvastatin (CRESTOR) 20 MG tablet TAKE (1) TABLET BY MOUTH AT BEDTIME. 90 tablet 0   senna (SENOKOT) 8.6 MG tablet Take 1 tablet by mouth at bedtime.      No current facility-administered medications on file prior to  visit.   No Known Allergies  Social History   Socioeconomic History   Marital status: Married    Spouse name: Not on file   Number of children: Not on file   Years of education: Not on file   Highest education level: Not on file  Occupational History   Not on file  Tobacco Use   Smoking status: Former    Types: Cigarettes    Quit date: 10/02/1983    Years since quitting: 37.7   Smokeless tobacco: Never  Vaping Use   Vaping Use: Never used  Substance and Sexual Activity   Alcohol use: Not Currently    Comment: occasional wine   Drug use: No   Sexual activity: Not Currently  Other Topics Concern   Not on file  Social History Narrative   Not on file   Social Determinants of Health   Financial Resource Strain: Low Risk    Difficulty of Paying Living Expenses: Not very hard  Food Insecurity: Not on file  Transportation Needs: Not on file  Physical Activity: Not on file  Stress: Not on file  Social Connections: Not on file  Intimate Partner Violence: Not on file     Review of Systems  All other systems reviewed and are negative.     Objective:   Physical Exam Vitals reviewed.  Constitutional:      General: She is not in acute distress.    Appearance: Normal appearance. She is normal weight. She is not ill-appearing or toxic-appearing.  Cardiovascular:     Rate and Rhythm: Normal rate and regular rhythm.     Pulses: Normal pulses.     Heart sounds: Normal heart sounds. No murmur heard.   No friction rub. No gallop.  Pulmonary:     Effort: Pulmonary effort is normal. No respiratory distress.     Breath sounds: Normal breath sounds. No stridor. No wheezing, rhonchi or rales.  Abdominal:     General: Abdomen is flat. Bowel sounds are normal.     Palpations: Abdomen is soft.  Musculoskeletal:     Right lower leg: No edema.     Left lower leg: No edema.  Neurological:     Mental Status: She is alert.          Assessment & Plan:  Stage 3b chronic kidney  disease ([place])  Primary hypertension - Plan: CBC with Differential/Platelet, COMPLETE METABOLIC PANEL WITH GFR Increase lisinopril to 20 mg a day and check blood pressure daily and notify me of the values in 1 week.  Goal blood pressures less than 140/90.  Check CBC and CMP.  I believe that she had a viral upper respiratory infection which is apparently cleared.  She looks well today.  Await the results of her blood pressure in 1 week

## 2021-07-04 ENCOUNTER — Telehealth: Payer: Self-pay | Admitting: Family Medicine

## 2021-07-04 MED ORDER — LISINOPRIL 10 MG PO TABS: 10.0000 mg | ORAL_TABLET | Freq: Every day | ORAL | 3 refills | Status: DC

## 2021-07-04 NOTE — Telephone Encounter (Signed)
Received call from patient's spouse [name] to follow up on recent visit. Patient's Rx for 20 MG of lisinopril (ZESTRIL) never received by pharmacy.   Pharmacy confirmed as   [facility] [place]  [address], [place] [address]  Phone:  [phone]  Fax:  [phone]   Please advise at [phone]

## 2021-07-04 NOTE — Telephone Encounter (Signed)
Rx sent to pharmacy as requested.

## 2021-07-04 NOTE — Telephone Encounter (Signed)
Patient's spouse dropped off log of recent b/p and pulse readings; listed below.  Placed in hanging folder at nurse's desk.    DATE  TIME  B/P  PULSE  06/23/21 1700  164/99  70  06/24/21 1600  147/83  65  06/25/21 1630  150/88   06/26/21 1700  146/88  68  06/27/21 1715  164/81  71  06/28/21 1700  164/84  64  06/29/21 1900  183/75  70  06/30/21 1200  132/76  68  07/01/21 1700  133/78  65  07/02/21 1700  152/72  67  07/03/21 1715  150/70  65

## 2021-07-05 ENCOUNTER — Other Ambulatory Visit: Payer: Self-pay

## 2021-07-05 MED ORDER — LISINOPRIL 20 MG PO TABS: 20.0000 mg | ORAL_TABLET | Freq: Two times a day (BID) | ORAL | 3 refills | Status: DC

## 2021-07-05 NOTE — Telephone Encounter (Signed)
Spoke with pt's husband an reviewed recommendations. New rx for Lisinopril 20mg  BID sent to pharmacy. He is aware to record BP readings and contact us in 1 month. Nothing further needed at this time.

## 2021-07-06 ENCOUNTER — Telehealth: Payer: Self-pay

## 2021-07-06 NOTE — Telephone Encounter (Signed)
Patient is currently taking lisinopril.  Per patient husband patient has started experiencing increased dry cough.  Could cough be related to lisinopril?  If so can medication be changed.  Pressure does seem to be down but lisinopril 20 bid seems to make cough worse.  Please advise.

## 2021-07-07 MED ORDER — VALSARTAN 320 MG PO TABS: 320.0000 mg | ORAL_TABLET | Freq: Every day | ORAL | 3 refills | Status: DC

## 2021-07-07 NOTE — Telephone Encounter (Signed)
Spoke with pt's husband and advised. Rx sent to pharmacy. He will call us if cough does not improve. Nothing further needed at this time.

## 2021-07-11 ENCOUNTER — Telehealth: Payer: Self-pay

## 2021-07-11 NOTE — Telephone Encounter (Signed)
Patents husband called to report cough is somewhat better but did have to take some medicine the other night to help it.  Advised to continue to monitor and if not better to make an appointment.  Negative covid at this time.

## 2021-08-02 ENCOUNTER — Other Ambulatory Visit: Payer: Self-pay | Admitting: Nurse Practitioner

## 2021-08-02 DIAGNOSIS — I739 Peripheral vascular disease, unspecified: Secondary | ICD-10-CM | POA: Diagnosis not present

## 2021-08-02 DIAGNOSIS — Z8711 Personal history of peptic ulcer disease: Secondary | ICD-10-CM

## 2021-08-02 DIAGNOSIS — L603 Nail dystrophy: Secondary | ICD-10-CM | POA: Diagnosis not present

## 2021-08-08 ENCOUNTER — Other Ambulatory Visit: Payer: Self-pay | Admitting: Family Medicine

## 2021-08-08 DIAGNOSIS — N184 Chronic kidney disease, stage 4 (severe): Secondary | ICD-10-CM | POA: Diagnosis not present

## 2021-08-08 DIAGNOSIS — Z7902 Long term (current) use of antithrombotics/antiplatelets: Secondary | ICD-10-CM | POA: Diagnosis not present

## 2021-08-08 DIAGNOSIS — I10 Essential (primary) hypertension: Secondary | ICD-10-CM

## 2021-08-08 DIAGNOSIS — Z8673 Personal history of transient ischemic attack (TIA), and cerebral infarction without residual deficits: Secondary | ICD-10-CM

## 2021-08-08 DIAGNOSIS — Z515 Encounter for palliative care: Secondary | ICD-10-CM | POA: Diagnosis not present

## 2021-08-08 DIAGNOSIS — E785 Hyperlipidemia, unspecified: Secondary | ICD-10-CM | POA: Diagnosis not present

## 2021-08-08 DIAGNOSIS — I69351 Hemiplegia and hemiparesis following cerebral infarction affecting right dominant side: Secondary | ICD-10-CM | POA: Diagnosis not present

## 2021-08-12 ENCOUNTER — Ambulatory Visit (INDEPENDENT_AMBULATORY_CARE_PROVIDER_SITE_OTHER): Payer: PPO

## 2021-08-12 ENCOUNTER — Telehealth: Payer: Self-pay

## 2021-08-12 ENCOUNTER — Other Ambulatory Visit: Payer: Self-pay | Admitting: Family Medicine

## 2021-08-12 ENCOUNTER — Other Ambulatory Visit: Payer: Self-pay

## 2021-08-12 VITALS — BP 146/88 | HR 64 | Ht 62.0 in | Wt 174.0 lb

## 2021-08-12 DIAGNOSIS — Z0000 Encounter for general adult medical examination without abnormal findings: Secondary | ICD-10-CM | POA: Diagnosis not present

## 2021-08-12 MED ORDER — AMLODIPINE BESYLATE 10 MG PO TABS: 10.0000 mg | ORAL_TABLET | Freq: Every day | ORAL | 3 refills | Status: DC

## 2021-08-12 NOTE — Progress Notes (Signed)
Subjective:   [name] is a 87 y.o. female who presents for Medicare Annual (Subsequent) preventive examination.  Review of Systems     Cardiac Risk Factors include: advanced age (>97mn, >>55women);hypertension;sedentary lifestyle;obesity (BMI >30kg/m2)     Objective:    Today's Vitals   08/12/21 1530  BP: (!) 146/88  Pulse: 64  SpO2: 100%  Weight: 174 lb (78.9 kg)  Height: '5\' 2"'$  (1.575 m)   Body mass index is 31.83 kg/m.  Advanced Directives 08/12/2021 12/19/2020 07/11/2018 04/28/2018 04/26/2018 04/17/2018 08/26/2014  Does Patient Have a Medical Advance Directive? Yes Yes Yes No No No No;Yes  Type of AParamedicof AMissionLiving will HGolfLiving will Living will - - - -  Does patient want to make changes to medical advance directive? - No - Patient declined - - - - -  Copy of HMurdockin Chart? Yes - validated most recent copy scanned in chart (See row information) No - copy requested - - - - -  Would patient like information on creating a medical advance directive? - - - - No - Patient declined No - Patient declined -  Pre-existing out of facility DNR order (yellow form or pink MOST form) - - - - - - -    Current Medications (verified) Outpatient Encounter Medications as of 08/12/2021  Medication Sig   carvedilol (COREG) 25 MG tablet TAKE (1) TABLET BY MOUTH TWICE DAILY.   clopidogrel (PLAVIX) 75 MG tablet TAKE ONE TABLET BY MOUTH ONCE DAILY.   dorzolamide-timolol (COSOPT) 22.3-6.8 MG/ML ophthalmic solution Place 1 drop into both eyes 2 (two) times daily.   Multiple Vitamins-Minerals (CENTRUM ULTRA WOMENS) TABS Take 1 tablet by mouth daily.     pantoprazole (PROTONIX) 40 MG tablet TAKE (1) TABLET BY MOUTH ONCE DAILY.   ROCKLATAN 0.02-0.005 % SOLN Apply 1 drop to eye daily.   rosuvastatin (CRESTOR) 20 MG tablet TAKE (1) TABLET BY MOUTH AT BEDTIME.   senna (SENOKOT) 8.6 MG tablet Take 1 tablet by  mouth at bedtime.    valsartan (DIOVAN) 320 MG tablet Take 1 tablet (320 mg total) by mouth daily.   No facility-administered encounter medications on file as of 08/12/2021.    Allergies (verified) Patient has no known allergies.   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)   Past Surgical History:  Procedure Laterality Date   APPENDECTOMY     ESOPHAGEAL DILATION N/A 07/11/2018   Procedure: ESOPHAGEAL DILATION;  Surgeon: RRogene Houston MD;  [facility];  Service: Endoscopy;  Laterality: N/A;   ESOPHAGOGASTRODUODENOSCOPY N/A 04/28/2018   Procedure: ESOPHAGOGASTRODUODENOSCOPY (EGD);  Surgeon: RRogene Houston MD;  [facility];  Service: Endoscopy;  Laterality: N/A;   ESOPHAGOGASTRODUODENOSCOPY N/A 07/11/2018   Procedure: ESOPHAGOGASTRODUODENOSCOPY (EGD);  Surgeon: RRogene Houston MD;  [facility];  Service: Endoscopy;  Laterality: N/A;  1200   HARDWARE REMOVAL Right 10/05/2014   Procedure:  HARDWARE REMOVAL;  Surgeon: Dorna Leitz, MD;  Location: [place];  Service: Orthopedics;  Laterality: Right;   INTRAMEDULLARY (IM) NAIL INTERTROCHANTERIC Right 06/12/2014   Procedure: INTRAMEDULLARY (IM) NAIL INTERTROCHANTRIC RIGHT HIP;  Surgeon: Alta Corning, MD;  Location: [place];  Service: Orthopedics;  Laterality: Right;   kidney stones     LITHOTRIPSY     PEG PLACEMENT     and removal   PEG TUBE REMOVAL     RADIOLOGY WITH ANESTHESIA  04/22/2012   Procedure: RADIOLOGY WITH ANESTHESIA;  Surgeon: Rob Hickman, MD;  Location: [place];  Service:  Radiology;  Laterality: N/A;  VERTEBRAL ARTERY STENT PLACEMENT AND CEREBRAL ANGIOGRAM   TONSILLECTOMY     TYMPANOPLASTY     Family History  Problem Relation Age of Onset   CVA Father    Social History   Socioeconomic History   Marital status: Married    Spouse name: [name]   Number of children: 0   Years of education: Not on file   Highest education level: Not on file  Occupational History    Comment: Retired Therapist, sports  Tobacco Use   Smoking status: Former    Types: Cigarettes    Quit date: 10/02/1983    Years since quitting: 37.8   Smokeless tobacco: Never  Vaping Use   Vaping Use: Never used  Substance and Sexual Activity   Alcohol use: Not Currently   Drug use: No   Sexual activity: Not Currently  Other Topics Concern   Not on file  Social History Narrative   Lives with husband [name].   2 step children.   Social Determinants of Health   Financial Resource Strain: Low Risk    Difficulty of Paying Living Expenses: Not hard at all  Food Insecurity: No Food Insecurity   Worried About Charity fundraiser in the Last Year: Never true   [place] in the Last Year: Never true  Transportation Needs: No Transportation Needs   Lack of Transportation (Medical): No   Lack of Transportation (Non-Medical): No  Physical Activity: Inactive   Days of Exercise per Week: 0 days   Minutes of Exercise per Session: 0 min  Stress: No Stress Concern Present   Feeling of Stress : Not at all  Social Connections: Moderately Integrated   Frequency of Communication with Friends and Family: More than three times a week   Frequency of Social Gatherings with Friends and Family: More than three times a week   Attends Religious Services: 1 to 4 times per year   Active Member of [employer] or Organizations: No   Attends Music therapist: Never   Marital Status: Married    Tobacco Counseling Counseling given: Not Answered   Clinical Intake:  Pre-visit preparation completed:  Yes  Pain : No/denies pain     BMI - recorded: 31.83 Nutritional Status: BMI > 30  Obese Nutritional Risks: None Diabetes: No  How often do you need to have someone help you when you read instructions, pamphlets, or other written materials from your doctor or pharmacy?: 1 - Never  Diabetic?no  Interpreter Needed?: No  Information entered by :: mj Deepika [place], lpn   Activities of Daily Living In your present state of health, do you have any difficulty performing the following activities: 08/12/2021  Hearing? N  Vision? N  Difficulty concentrating or making decisions? N  Walking or climbing stairs? Y  Dressing or bathing? N  Doing errands, shopping? Y  Conservation officer, nature  and eating ? Y  Using the Toilet? N  In the past six months, have you accidently leaked urine? Y  Comment Wears pads  Do you have problems with loss of bowel control? N  Managing your Medications? Y  Managing your Finances? N  Housekeeping or managing your Housekeeping? Y  Some recent data might be hidden    Patient Care Team: Susy Frizzle, MD as PCP - General (Family Medicine) Edythe Clarity, [facility] as Pharmacist (Pharmacist)  Indicate any recent Medical Services you may have received from other than [facility] providers in the past year (date may be approximate).     Assessment:   This is a routine wellness examination for Tamila.  Hearing/Vision screen Hearing Screening - Comments:: Hearing aids. Vision Screening - Comments:: Glasses. 08/16/21,   Dietary issues and exercise activities discussed: Current Exercise Habits: The patient does not participate in regular exercise at present, Exercise limited by: cardiac condition(s);neurologic condition(s)   Goals Addressed             This Visit's Progress    Exercise 3x per week (30 min per time)       Try to increase exercise.        Depression Screen PHQ 2/9 Scores 08/12/2021 12/02/2019 10/06/2019  PHQ - 2 Score 0 0 0    Fall Risk Fall Risk   08/12/2021 10/06/2019 10/06/2019 01/02/2019 02/15/2015  Falls in the past year? 0 0 0 (No Data) Yes  Comment - - - Emmi Telephone Survey: data to providers prior to load -  Number falls in past yr: 0 0 0 (No Data) 1  Comment - - - Emmi Telephone Survey Actual Response =  -  Injury with Fall? 0 0 0 - Yes  Risk Factor Category  - - - - High Fall Risk  Risk for fall due to : Impaired balance/gait;Impaired mobility - - - History of fall(s)  Follow up Falls prevention discussed - - - Follow up appointment  Comment - - - - Patients husband states patient is followin up closely with orthopedic doctor,  also possible outpatient therapy follow up.     FALL RISK PREVENTION PERTAINING TO THE HOME:  Any stairs in or around the home? Yes  If so, are there any without handrails? No  Home free of loose throw rugs in walkways, pet beds, electrical cords, etc? Yes  Adequate lighting in your home to reduce risk of falls? Yes   ASSISTIVE DEVICES UTILIZED TO PREVENT FALLS:  Life alert? No  Use of a cane, walker or w/c? Yes  Grab bars in the bathroom? Yes  Shower chair or bench in shower? Yes  Elevated toilet seat or a handicapped toilet? Yes   TIMED UP AND GO:  Was the test performed? Yes .  Phone visit.  Cognitive Function: Normal cognitive status assessed by direct observation by this Nurse Health Advisor. No abnormalities found.  Unable to perform 6CIT due to aphasia from CVA.         Immunizations Immunization History  Administered Date(s) Administered   Fluad Quad(high Dose 65+) 03/23/2021   Influenza, High Dose Seasonal PF 03/28/2018, 03/04/2019   Influenza-Unspecified 04/14/2020   PFIZER(Purple Top)SARS-COV-2 Vaccination 07/03/2019, 07/25/2019, 05/21/2020   Pneumococcal Polysaccharide-23 02/01/2021   Zoster, Live 02/25/2016    TDAP status: Due, Education has been provided regarding the importance of this vaccine. Advised may receive this vaccine at local pharmacy or Health Dept. Aware to  provide a copy of the vaccination record  if obtained from local pharmacy or Health Dept. Verbalized acceptance and understanding.  Flu Vaccine status: Up to date  Pneumococcal vaccine status: Up to date  Covid-19 vaccine status: Completed vaccines  Qualifies for Shingles Vaccine? Yes   Zostavax completed No   Shingrix Completed?: No.    Education has been provided regarding the importance of this vaccine. Patient has been advised to call insurance company to determine out of pocket expense if they have not yet received this vaccine. Advised may also receive vaccine at local pharmacy or Health Dept. Verbalized acceptance and understanding.  Screening Tests Health Maintenance  Topic Date Due   TETANUS/TDAP  Never done   Zoster Vaccines- Shingrix (1 of 2) Never done   Pneumonia Vaccine 41+ Years old (2 - PCV) 02/01/2022   INFLUENZA VACCINE  Completed   HPV VACCINES  Aged Out   DEXA SCAN  Discontinued   COVID-19 Vaccine  Discontinued    Health Maintenance  Health Maintenance Due  Topic Date Due   TETANUS/TDAP  Never done   Zoster Vaccines- Shingrix (1 of 2) Never done    Colorectal cancer screening: No longer required.   Mammogram status: No longer required due to age.  Bone Density status: Ordered No longer required due to age and mobility. Pt provided with contact info and advised to call to schedule appt.  Lung Cancer Screening: (Low Dose CT Chest recommended if Age 65-80 years, 30 pack-year currently smoking OR have quit w/in 15years.) does not qualify.    Additional Screening:  Hepatitis C Screening: does not qualify;   Vision Screening: Recommended annual ophthalmology exams for early detection of glaucoma and other disorders of the eye. Is the patient up to date with their annual eye exam?  Yes  Who is the provider or what is the name of the office in which the patient attends annual eye exams? Optometrist on [address] in [place] per pt.  If pt is not  established with a provider, would they like to be referred to a provider to establish care? No .   Dental Screening: Recommended annual dental exams for proper oral hygiene  Community Resource Referral / Chronic Care Management: CRR required this visit?  No   CCM required this visit?  No      Plan:     I have personally reviewed and noted the following in the patients chart:   Medical and social history Use of alcohol, tobacco or illicit drugs  Current medications and supplements including opioid prescriptions.  Functional ability and status Nutritional status Physical activity Advanced directives List of other physicians Hospitalizations, surgeries, and ER visits in previous 12 months Vitals Screenings to include cognitive, depression, and falls Referrals and appointments  In addition, I have reviewed and discussed with patient certain preventive protocols, quality metrics, and best practice recommendations. A written personalized care plan for preventive services as well as general preventive health recommendations were provided to patient.     Chriss Driver, LPN   9/38/1017   Nurse Notes: Visit completed with patient and patient's husband, [name]. Pt has aphasia and has difficulty answering questions. Discussed shingrix vaccine and how to obtain.

## 2021-08-12 NOTE — Patient Instructions (Signed)
[name] , Thank you for taking time to come for your Medicare Wellness Visit. I appreciate your ongoing commitment to your health goals. Please review the following plan we discussed and let me know if I can assist you in the future.   Screening recommendations/referrals: Colonoscopy: No longer required.  Mammogram: No longer required. Bone Density: No longer required.   Recommended yearly ophthalmology/optometry visit for glaucoma screening and checkup Recommended yearly dental visit for hygiene and checkup  Vaccinations: Influenza vaccine: Done 03/23/2021 Repeat annually  Pneumococcal vaccine: Done 02/01/2021 Tdap vaccine: Due Repeat in 10 years  Shingles vaccine: Done 02/25/2016   Covid-19:Done 07/03/2019, 07/25/2019 and 05/21/2020.  Advanced directives: Copy scanned into chart.  Conditions/risks identified: KEEP UP THE GOOD WORK!!  Next appointment: Follow up in one year for your annual wellness visit 2024.   Preventive Care 3 Years and Older, Female Preventive care refers to lifestyle choices and visits with your health care provider that can promote health and wellness. What does preventive care include? A yearly physical exam. This is also called an annual well check. Dental exams once or twice a year. Routine eye exams. Ask your health care provider how often you should have your eyes checked. Personal lifestyle choices, including: Daily care of your teeth and gums. Regular physical activity. Eating a healthy diet. Avoiding tobacco and drug use. Limiting alcohol use. Practicing safe sex. Taking low-dose aspirin every day. Taking vitamin and mineral supplements as recommended by your health care provider. What happens during an annual well check? The services and screenings done by your health care provider during your annual well check will depend on your age, overall health, lifestyle risk factors, and family history of disease. Counseling  Your health care  provider may ask you questions about your: Alcohol use. Tobacco use. Drug use. Emotional well-being. Home and relationship well-being. Sexual activity. Eating habits. History of falls. Memory and ability to understand (cognition). Work and work Statistician. Reproductive health. Screening  You may have the following tests or measurements: Height, weight, and BMI. Blood pressure. Lipid and cholesterol levels. These may be checked every 5 years, or more frequently if you are over 49 years old. Skin check. Lung cancer screening. You may have this screening every year starting at age 27 if you have a 30-pack-year history of smoking and currently smoke or have quit within the past 15 years. Fecal occult blood test (FOBT) of the stool. You may have this test every year starting at age 39. Flexible sigmoidoscopy or colonoscopy. You may have a sigmoidoscopy every 5 years or a colonoscopy every 10 years starting at age 72. Hepatitis C blood test. Hepatitis B blood test. Sexually transmitted disease (STD) testing. Diabetes screening. This is done by checking your blood sugar (glucose) after you have not eaten for a while (fasting). You may have this done every 1-3 years. Bone density scan. This is done to screen for osteoporosis. You may have this done starting at age 22. Mammogram. This may be done every 1-2 years. Talk to your health care provider about how often you should have regular mammograms. Talk with your health care provider about your test results, treatment options, and if necessary, the need for more tests. Vaccines  Your health care provider may recommend certain vaccines, such as: Influenza vaccine. This is recommended every year. Tetanus, diphtheria, and acellular pertussis (Tdap, Td) vaccine. You may need a Td booster every 10 years. Zoster vaccine. You may need this after age 15. Pneumococcal 13-valent conjugate (PCV13)  vaccine. One dose is recommended after age  98. Pneumococcal polysaccharide (PPSV23) vaccine. One dose is recommended after age 58. Talk to your health care provider about which screenings and vaccines you need and how often you need them. This information is not intended to replace advice given to you by your health care provider. Make sure you discuss any questions you have with your health care provider. Document Released: 06/18/2015 Document Revised: 02/09/2016 Document Reviewed: 03/23/2015 Elsevier Interactive Patient Education  2017 [place] Prevention in the Home Falls can cause injuries. They can happen to people of all ages. There are many things you can do to make your home safe and to help prevent falls. What can I do on the outside of my home? Regularly fix the edges of walkways and driveways and fix any cracks. Remove anything that might make you trip as you walk through a door, such as a raised step or threshold. Trim any bushes or trees on the path to your home. Use bright outdoor lighting. Clear any walking paths of anything that might make someone trip, such as rocks or tools. Regularly check to see if handrails are loose or broken. Make sure that both sides of any steps have handrails. Any raised decks and porches should have guardrails on the edges. Have any leaves, snow, or ice cleared regularly. Use sand or salt on walking paths during winter. Clean up any spills in your garage right away. This includes oil or grease spills. What can I do in the bathroom? Use night lights. Install grab bars by the toilet and in the tub and shower. Do not use towel bars as grab bars. Use non-skid mats or decals in the tub or shower. If you need to sit down in the shower, use a plastic, non-slip stool. Keep the floor dry. Clean up any water that spills on the floor as soon as it happens. Remove soap buildup in the tub or shower regularly. Attach bath mats securely with double-sided non-slip rug tape. Do not have throw  rugs and other things on the floor that can make you trip. What can I do in the bedroom? Use night lights. Make sure that you have a light by your bed that is easy to reach. Do not use any sheets or blankets that are too big for your bed. They should not hang down onto the floor. Have a firm chair that has side arms. You can use this for support while you get dressed. Do not have throw rugs and other things on the floor that can make you trip. What can I do in the kitchen? Clean up any spills right away. Avoid walking on wet floors. Keep items that you use a lot in easy-to-reach places. If you need to reach something above you, use a strong step stool that has a grab bar. Keep electrical cords out of the way. Do not use floor polish or wax that makes floors slippery. If you must use wax, use non-skid floor wax. Do not have throw rugs and other things on the floor that can make you trip. What can I do with my stairs? Do not leave any items on the stairs. Make sure that there are handrails on both sides of the stairs and use them. Fix handrails that are broken or loose. Make sure that handrails are as long as the stairways. Check any carpeting to make sure that it is firmly attached to the stairs. Fix any carpet that is loose  or worn. Avoid having throw rugs at the top or bottom of the stairs. If you do have throw rugs, attach them to the floor with carpet tape. Make sure that you have a light switch at the top of the stairs and the bottom of the stairs. If you do not have them, ask someone to add them for you. What else can I do to help prevent falls? Wear shoes that: Do not have high heels. Have rubber bottoms. Are comfortable and fit you well. Are closed at the toe. Do not wear sandals. If you use a stepladder: Make sure that it is fully opened. Do not climb a closed stepladder. Make sure that both sides of the stepladder are locked into place. Ask someone to hold it for you, if  possible. Clearly mark and make sure that you can see: Any grab bars or handrails. First and last steps. Where the edge of each step is. Use tools that help you move around (mobility aids) if they are needed. These include: Canes. Walkers. Scooters. Crutches. Turn on the lights when you go into a dark area. Replace any light bulbs as soon as they burn out. Set up your furniture so you have a clear path. Avoid moving your furniture around. If any of your floors are uneven, fix them. If there are any pets around you, be aware of where they are. Review your medicines with your doctor. Some medicines can make you feel dizzy. This can increase your chance of falling. Ask your doctor what other things that you can do to help prevent falls. This information is not intended to replace advice given to you by your health care provider. Make sure you discuss any questions you have with your health care provider. Document Released: 03/18/2009 Document Revised: 10/28/2015 Document Reviewed: 06/26/2014 Elsevier Interactive Patient Education  2017 [employer].

## 2021-08-12 NOTE — Telephone Encounter (Signed)
Pt's BP was 146/88 in office today. Pt's husband states BP has been running in the 150's/84-86 at home since change in medication to Valsartan 320 mg. Thank you.  ?

## 2021-08-16 DIAGNOSIS — H401123 Primary open-angle glaucoma, left eye, severe stage: Secondary | ICD-10-CM | POA: Diagnosis not present

## 2021-08-16 DIAGNOSIS — H401112 Primary open-angle glaucoma, right eye, moderate stage: Secondary | ICD-10-CM | POA: Diagnosis not present

## 2021-09-05 DIAGNOSIS — L820 Inflamed seborrheic keratosis: Secondary | ICD-10-CM | POA: Diagnosis not present

## 2021-09-05 DIAGNOSIS — D485 Neoplasm of uncertain behavior of skin: Secondary | ICD-10-CM | POA: Diagnosis not present

## 2021-09-15 NOTE — Progress Notes (Signed)
? ?Chronic Care Management ?Pharmacy Note ? ?09/29/2021 ?Name:  [name] MRN:  [id number] DOB:  11-18-1934 ? ?Summary: ?PharmD follow up.  BP doing better on new meds.  No adverse effects.  They have home health coming in tomorrow and will have her BP checked. ? ? ?Subjective: ?[name] is an 87 y.o. year old female who is a primary patient of Pickard, Cammie Mcgee, MD.  The CCM team was consulted for assistance with disease management and care coordination needs.   ? ?Engaged with patient by telephone for initial visit in response to provider referral for pharmacy case management and/or care coordination services.  ? ?Consent to Services:  ?The patient was given the following information about Chronic Care Management services today, agreed to services, and gave verbal consent: 1. CCM service includes personalized support from designated clinical staff supervised by the primary care provider, including individualized plan of care and coordination with other care providers 2. 24/7 contact phone numbers for assistance for urgent and routine care needs. 3. Service will only be billed when office clinical staff spend 20 minutes or more in a month to coordinate care. 4. Only one practitioner may furnish and bill the service in a calendar month. 5.The patient may stop CCM services at any time (effective at the end of the month) by phone call to the office staff. 6. The patient will be responsible for cost sharing (co-pay) of up to 20% of the service fee (after annual deductible is met). Patient agreed to services and consent obtained. ? ?Patient Care Team: ?Susy Frizzle, MD as PCP - General (Family Medicine) ?Edythe Clarity, [facility] as Pharmacist (Pharmacist) ? ?Recent office visits:  ?01/03/21 Dr. Dennard Schaumann For hospital follow-up. STARTED Lisinopril 10 mg daily.  ?  ?Recent consult visits:  ?09/28/20 Otolaryngology Beckie Salts, MD. For Cerumen Impaction.No medication changes. ?  ?Hospital visits:   ?12/19/20 [facility] ( 3 Days) Chrissie Noa, MD. For sepsis due to [facility] urinary. STOPPED Cephalexin, Lisinopril, and Lisinopril-Hydrochlorothiazide. CHANGED Carvedilol to 25 mg 1 tablet two times daily.   ?12/19/20 [facility] ( 5 Hours) Noemi Chapel, MD. For Lower urinary tract infectious disease. STARTED Cephalexin 500 mg 3 times daily and Potassium Chloride 10 MEQ daily.  ?  ?Medication History: ?Rosuvastatin 20 mg 02/10/21 90 DS ?Lisinopril 10 mg 01/03/21 90 DS ? ? ?Objective: ? ?Lab Results  ?Component Value Date  ? CREATININE 1.65 (H) 06/23/2021  ? BUN 17 06/23/2021  ? GFRNONAA 35 (L) 12/22/2020  ? GFRAA 26 (L) 10/08/2019  ? NA 144 06/23/2021  ? K 3.6 06/23/2021  ? CALCIUM 8.5 (L) 06/23/2021  ? CO2 30 06/23/2021  ? GLUCOSE 130 (H) 06/23/2021  ? ? ?Lab Results  ?Component Value Date/Time  ? HGBA1C  12/09/2008 05:55 AM  ?  5.4 ?(NOTE) The ADA recommends the following therapeutic goal for glycemic control related to Hgb A1c measurement: Goal of therapy: <6.5 Hgb A1c  Reference: American Diabetes Association: Clinical Practice Recommendations 2010, Diabetes Care, 2010, 33: (Suppl ? 1).  ? HGBA1C  11/24/2008 06:07 AM  ?  5.6 ?(NOTE) The ADA recommends the following therapeutic goal for glycemic control related to Hgb A1c measurement: Goal of therapy: <6.5 Hgb A1c  Reference: American Diabetes Association: Clinical Practice Recommendations 2010, Diabetes Care, 2010, 33: (Suppl ? 1).  ?  ?Last diabetic Eye exam: No results found for: HMDIABEYEEXA  ?Last diabetic Foot exam: No results found for: HMDIABFOOTEX  ? ?Lab Results  ?  Component Value Date  ? CHOL 163 10/08/2019  ? HDL 48 (L) 10/08/2019  ? [place] 88 10/08/2019  ? TRIG 172 (H) 10/08/2019  ? CHOLHDL 3.4 10/08/2019  ? ? ? ?  Latest Ref Rng & Units 06/23/2021  ?  3:41 PM 01/03/2021  ? 11:40 AM 12/20/2020  ?  4:19 AM  ?Hepatic Function  ?Total Protein 6.1 - 8.1 g/dL 5.8   6.1   5.3    ?Albumin 3.5 - 5.0 g/dL   2.7    ?AST 10 - 35  U/L $Re'14   18   17    'DNy$ ?ALT 6 - 29 U/L $Remo'6   10   10    'VBZIB$ ?Alk Phosphatase 38 - 126 U/L   61    ?Total Bilirubin 0.2 - 1.2 mg/dL 0.4   0.4   0.6    ? ? ?Lab Results  ?Component Value Date/Time  ? TSH 1.54 10/08/2019 08:42 AM  ? TSH 0.732 04/26/2018 09:21 AM  ? ? ? ?  Latest Ref Rng & Units 06/23/2021  ?  3:41 PM 01/03/2021  ? 11:40 AM 12/22/2020  ?  3:32 AM  ?CBC  ?WBC 3.8 - 10.8 Thousand/uL 6.8   7.2   6.9    ?Hemoglobin 11.7 - 15.5 g/dL 11.8   12.2   11.1    ?Hematocrit 35.0 - 45.0 % 37.3   38.8   35.6    ?Platelets 140 - 400 Thousand/uL 315   345   175    ? ? ?No results found for: VD25OH ? ?Clinical ASCVD: Yes  ?The ASCVD Risk score (Arnett DK, et al., 2019) failed to calculate for the following reasons: ?  The 2019 ASCVD risk score is only valid for ages 84 to 55   ? ? ?  08/12/2021  ?  3:44 PM 12/02/2019  ?  8:40 AM 10/06/2019  ?  3:24 PM  ?Depression screen PHQ 2/9  ?Decreased Interest 0 0 0  ?Down, Depressed, Hopeless 0 0 0  ?PHQ - 2 Score 0 0 0  ?  ? ? ?Social History  ? ?Tobacco Use  ?Smoking Status Former  ? Types: Cigarettes  ? Quit date: 10/02/1983  ? Years since quitting: 38.0  ?Smokeless Tobacco Never  ? ?BP Readings from Last 3 Encounters:  ?08/12/21 (!) 146/88  ?06/23/21 (!) 168/82  ?05/12/21 132/78  ? ?Pulse Readings from Last 3 Encounters:  ?08/12/21 64  ?06/23/21 77  ?05/12/21 73  ? ?Wt Readings from Last 3 Encounters:  ?08/12/21 174 lb (78.9 kg)  ?06/23/21 174 lb (78.9 kg)  ?05/12/21 161 lb 6.4 oz (73.2 kg)  ? ?BMI Readings from Last 3 Encounters:  ?08/12/21 31.83 kg/m?  ?06/23/21 31.83 kg/m?  ?05/12/21 29.52 kg/m?  ? ? ?Assessment/Interventions: Review of patient past medical history, allergies, medications, health status, including review of consultants reports, laboratory and other test data, was performed as part of comprehensive evaluation and provision of chronic care management services.  ? ?SDOH:  (Social Determinants of Health) assessments and interventions performed: Yes ? ?Financial Resource Strain:  Low Risk   ? Difficulty of Paying Living Expenses: Not hard at all  ? ? ?SDOH Screenings  ? ?Alcohol Screen: Low Risk   ? Last Alcohol Screening Score (AUDIT): 0  ?Depression (PHQ2-9): Low Risk   ? PHQ-2 Score: 0  ?Financial Resource Strain: Low Risk   ? Difficulty of Paying Living Expenses: Not hard at all  ?Food Insecurity: No Food Insecurity  ? Worried  About Running Out of Food in the Last Year: Never true  ? Ran Out of Food in the Last Year: Never true  ?Housing: Low Risk   ? Last Housing Risk Score: 0  ?Physical Activity: Inactive  ? Days of Exercise per Week: 0 days  ? Minutes of Exercise per Session: 0 min  ?Social Connections: Moderately Integrated  ? Frequency of Communication with Friends and Family: More than three times a week  ? Frequency of Social Gatherings with Friends and Family: More than three times a week  ? Attends Religious Services: 1 to 4 times per year  ? Active Member of Clubs or Organizations: No  ? Attends Archivist Meetings: Never  ? Marital Status: Married  ?Stress: No Stress Concern Present  ? Feeling of Stress : Not at all  ?Tobacco Use: Medium Risk  ? Smoking Tobacco Use: Former  ? Smokeless Tobacco Use: Never  ? Passive Exposure: Not on file  ?Transportation Needs: No Transportation Needs  ? Lack of Transportation (Medical): No  ? Lack of Transportation (Non-Medical): No  ? ? ?CCM Care Plan ? ?No Known Allergies ? ?Medications Reviewed Today   ? ? Reviewed by Edythe Clarity, RPH (Pharmacist) on 09/29/21 at 1434  Med List Status: <None>  ? ?Medication Order Taking? Sig Documenting Provider Last Dose Status Informant  ?amLODipine (NORVASC) 10 MG tablet [id number] Yes Take 1 tablet (10 mg total) by mouth daily. Susy Frizzle, MD Taking Active   ?carvedilol (COREG) 25 MG tablet [id number] Yes TAKE (1) TABLET BY MOUTH TWICE DAILY. Susy Frizzle, MD Taking Active   ?clopidogrel (PLAVIX) 75 MG tablet [id number] Yes TAKE ONE TABLET BY MOUTH ONCE DAILY. Susy Frizzle,  MD Taking Active   ?dorzolamide-timolol (COSOPT) 22.3-6.8 MG/ML ophthalmic solution [id number] Yes Place 1 drop into both eyes 2 (two) times daily. [provider] Taking Active Spouse/Significant Ot

## 2021-09-29 ENCOUNTER — Ambulatory Visit (INDEPENDENT_AMBULATORY_CARE_PROVIDER_SITE_OTHER): Payer: PPO | Admitting: Pharmacist

## 2021-09-29 DIAGNOSIS — I10 Essential (primary) hypertension: Secondary | ICD-10-CM

## 2021-09-29 DIAGNOSIS — E785 Hyperlipidemia, unspecified: Secondary | ICD-10-CM

## 2021-09-29 NOTE — Patient Instructions (Addendum)
Visit Information ? ? Goals Addressed   ? ?  ?  ?  ?  ? This Visit's Progress  ?  Track and Manage My Blood Pressure-Hypertension   On track  ?  Timeframe:  Long-Range Goal ?Priority:  High ?Start Date:  03/31/21                           ?Expected End Date:   09/29/21                   ? ?Follow Up Date 07/01/21  ?  ?- check blood pressure weekly ?- choose a place to take my blood pressure (home, clinic or office, retail store) ?- write blood pressure results in a log or diary  ?  ?Why is this important?   ?You won't feel high blood pressure, but it can still hurt your blood vessels.  ?High blood pressure can cause heart or kidney problems. It can also cause a stroke.  ?Making lifestyle changes like losing a little weight or eating less salt will help.  ?Checking your blood pressure at home and at different times of the day can help to control blood pressure.  ?If the doctor prescribes medicine remember to take it the way the doctor ordered.  ?Call the office if you cannot afford the medicine or if there are questions about it.   ?  ?Notes:  ?  ? ?  ? ?Patient Care Plan: General Pharmacy (Adult)  ?  ? ?Problem Identified: HTN, CKD, Hx of CVA, HLD   ?Priority: High  ?Onset Date: 03/31/2021  ?  ? ?Long-Range Goal: Patient-Specific Goal   ?Start Date: 03/31/2021  ?Expected End Date: 09/29/2021  ?Recent Progress: On track  ?Priority: High  ?Note:   ?Current Barriers:  ?BP fluctuating ? ?Pharmacist Clinical Goal(s):  ?Patient will verbalize ability to afford treatment regimen ?maintain control of BP as evidenced by monitoring  through collaboration with PharmD and provider.  ? ?Interventions: ?1:1 collaboration with Susy Frizzle, MD regarding development and update of comprehensive plan of care as evidenced by provider attestation and co-signature ?Inter-disciplinary care team collaboration (see longitudinal plan of care) ?Comprehensive medication review performed; medication list updated in electronic medical  record ? ?Hypertension (BP goal <130/80) ?-Controlled ?-Current treatment: ?Carvedilol '25mg'$  BID Appropriate, Effective, Safe, Accessible ?Valsartan '320mg'$  daily Appropriate, Effective, Safe, Accessible ?Amlodipine '10mg'$  Appropriate, Effective, Safe, Accessible ?-Medications previously tried: lisinopril ?-Current home readings:  ?-Current dietary habits: normal at home, no logs today ?-Current exercise habits: minimal, husband reports she is in need of knee replacement and the pain limits her ability to exercise ?-Denies hypotensive/hypertensive symptoms ?-Educated on BP goals and benefits of medications for prevention of heart attack, stroke and kidney damage; ?Importance of home blood pressure monitoring; ?Symptoms of hypotension and importance of maintaining adequate hydration; ?-Counseled to monitor BP at home periodically, document, and provide log at future appointments ?-Recommended to continue current medication ? ?Update 09/29/21 ?Home BP readings - 140s/80s per her husband ?They have a home visit set up tomorrow with [facility] where they will be coming out to check her blood pressure. ?Husband prefers her BP to run a little higher.  Has to stop lisinopril recently due to increased dry cough.  Cough has improved on new regimen. ?Will continue to follow BP and adjust meds as necessary with quarterly check ins. ? ?Hyperlipidemia/: (LDL goal < 70) ?-Not ideally controlled ?-Current treatment: ?Rosuvastatin '20mg'$  daily Appropriate, Query effective, , ?-  Medications previously tried: none noted  ?-Most recent LDL is 88 in May 2021.  Goal < 70 with Hx of Stroke. ?-Patient in stage 3b CKD - continue to monitor. ?-Recommended to continue current medication ?Could consider switch to Atorvastatin in the future if renal function continues to decline. ? ?Update 09/29/21 ?No lipid panel on file since 2021.  GFR borderline for Crestor.  Continue to monitor. ?Reminded patient's husband thay she is due for fasting lab work and  he reports he will check and make appointment if necessary.   ?Continue current medications for now - can adjust based on updated GFR and lipid panel. ?Pt goal LDL < 70 due to previous stroke.  Could even shoot for < 55. ? ?Hx of CVA (Goal: Reduce CV risk) ?-Controlled ?-Current treatment  ?Clopidogrel '75mg'$  daily ?-Medications previously tried: none noted ?-BP controlled, patient is on statin, would benefit from LDL < 70. ?-Denies any abnormal bleeding at this time  ?-Recommended to continue current medication ?Continue to monitor and address risk factors. ? ?Patient Goals/Self-Care Activities ?Patient will:  ?- take medications as prescribed ?check blood pressure periodically, document, and provide at future appointments ?collaborate with provider on medication access solutions ? ?Follow Up Plan: The care management team will reach out to the patient again over the next 180 days.  ?  ? ?  ?  ? ?The patient verbalized understanding of instructions, educational materials, and care plan provided today and declined offer to receive copy of patient instructions, educational materials, and care plan.  ?Telephone follow up appointment with pharmacy team member scheduled for: 1 year ? ?Edythe Clarity, [facility]  ?Beverly Milch, PharmD, CPP ?Clinical Pharmacist Practitioner ?[place] ?[phone] ? ?

## 2021-09-30 DIAGNOSIS — I69351 Hemiplegia and hemiparesis following cerebral infarction affecting right dominant side: Secondary | ICD-10-CM | POA: Diagnosis not present

## 2021-09-30 DIAGNOSIS — Z7902 Long term (current) use of antithrombotics/antiplatelets: Secondary | ICD-10-CM | POA: Diagnosis not present

## 2021-09-30 DIAGNOSIS — Z515 Encounter for palliative care: Secondary | ICD-10-CM | POA: Diagnosis not present

## 2021-09-30 DIAGNOSIS — K219 Gastro-esophageal reflux disease without esophagitis: Secondary | ICD-10-CM | POA: Diagnosis not present

## 2021-09-30 DIAGNOSIS — E785 Hyperlipidemia, unspecified: Secondary | ICD-10-CM | POA: Diagnosis not present

## 2021-09-30 DIAGNOSIS — I739 Peripheral vascular disease, unspecified: Secondary | ICD-10-CM | POA: Diagnosis not present

## 2021-09-30 DIAGNOSIS — D692 Other nonthrombocytopenic purpura: Secondary | ICD-10-CM | POA: Diagnosis not present

## 2021-09-30 DIAGNOSIS — I129 Hypertensive chronic kidney disease with stage 1 through stage 4 chronic kidney disease, or unspecified chronic kidney disease: Secondary | ICD-10-CM | POA: Diagnosis not present

## 2021-09-30 DIAGNOSIS — N184 Chronic kidney disease, stage 4 (severe): Secondary | ICD-10-CM | POA: Diagnosis not present

## 2021-10-02 DIAGNOSIS — I10 Essential (primary) hypertension: Secondary | ICD-10-CM

## 2021-10-02 DIAGNOSIS — E785 Hyperlipidemia, unspecified: Secondary | ICD-10-CM

## 2021-10-11 DIAGNOSIS — I739 Peripheral vascular disease, unspecified: Secondary | ICD-10-CM | POA: Diagnosis not present

## 2021-10-11 DIAGNOSIS — L603 Nail dystrophy: Secondary | ICD-10-CM | POA: Diagnosis not present

## 2021-10-20 ENCOUNTER — Telehealth: Payer: Self-pay | Admitting: Pharmacist

## 2021-10-20 NOTE — Progress Notes (Signed)
    Chronic Care Management Pharmacy Assistant   Name: [name]  MRN: [id number] DOB: Nov 04, 1934   Reason for Encounter: Disease State - Hypertension Call     Recent office visits:  None noted.   Recent consult visits:  None noted.   Hospital visits:  None in previous 6 months  Medications: Outpatient Encounter Medications as of 10/20/2021  Medication Sig   amLODipine (NORVASC) 10 MG tablet Take 1 tablet (10 mg total) by mouth daily.   carvedilol (COREG) 25 MG tablet TAKE (1) TABLET BY MOUTH TWICE DAILY.   clopidogrel (PLAVIX) 75 MG tablet TAKE ONE TABLET BY MOUTH ONCE DAILY.   dorzolamide-timolol (COSOPT) 22.3-6.8 MG/ML ophthalmic solution Place 1 drop into both eyes 2 (two) times daily.   Multiple Vitamins-Minerals (CENTRUM ULTRA WOMENS) TABS Take 1 tablet by mouth daily.     pantoprazole (PROTONIX) 40 MG tablet TAKE (1) TABLET BY MOUTH ONCE DAILY.   ROCKLATAN 0.02-0.005 % SOLN Apply 1 drop to eye daily.   rosuvastatin (CRESTOR) 20 MG tablet TAKE (1) TABLET BY MOUTH AT BEDTIME.   senna (SENOKOT) 8.6 MG tablet Take 1 tablet by mouth at bedtime.    valsartan (DIOVAN) 320 MG tablet Take 1 tablet (320 mg total) by mouth daily.   No facility-administered encounter medications on file as of 10/20/2021.    Current antihypertensive regimen:  Carvedilol '25mg'$  BID Lisinopril '10mg'$  daily   How often are you checking your Blood Pressure?  Patient is checking her blood pressure once a week.   Current home BP readings: 140/90     What recent interventions/DTPs have been made by any provider to improve Blood Pressure control since last CPP Visit:  Patient denied any changes in current regimen.      Any recent hospitalizations or ED visits since last visit with CPP?  Patient has not had any hospitalizations or ED visits since last visit with CPP.      What diet changes have been made to improve Blood Pressure Control?  Patient reports trying to be mindful of her salt  intake.      What exercise is being done to improve your Blood Pressure Control?  Patient reports she tries to remain as active as possible but does not have a specific exercise schedule.        Adherence Review: Is the patient currently on ACE/ARB medication? Yes Does the patient have >5 day gap between last estimated fill dates? No       Care Gaps   AWV: done 08/12/21 Colonoscopy: upper EGD done 07/11/18 DM Eye Exam: N/A DM Foot Exam: N/A Microalbumin: N/A HbgAIC:N/A DEXA: unknown Mammogram: unknown   Star Rating Drugs: Lisinopril (ZESTRIL) 10 MG tablet - last filled 07/05/21 90 days Rosuvastatin (CRESTOR) 20 MG tablet - last filled 08/08/21 90 days    Future Appointments  Date Time Provider [place]  08/18/2022  3:30 PM [facility]-NURSE HEALTH ADVISOR [facility]  09/28/2022  2:00 PM [facility]-CCM PHARMACIST [facility]     Jobe Gibbon, [place] Clinical Pharmacist Assistant  [phone]

## 2021-11-08 ENCOUNTER — Other Ambulatory Visit: Payer: Self-pay | Admitting: Family Medicine

## 2021-11-08 DIAGNOSIS — Z8673 Personal history of transient ischemic attack (TIA), and cerebral infarction without residual deficits: Secondary | ICD-10-CM

## 2021-11-08 DIAGNOSIS — I10 Essential (primary) hypertension: Secondary | ICD-10-CM

## 2021-11-08 NOTE — Telephone Encounter (Signed)
Requested medication (s) are due for refill today: yes  Requested medication (s) are on the active medication list: yes  Last refill:  08/08/21 #90/0  Future visit scheduled: no  Notes to clinic:  Unable to refill per protocol due to failed labs, no updated results.      Requested Prescriptions  Pending Prescriptions Disp Refills   rosuvastatin (CRESTOR) 20 MG tablet [Pharmacy Med Name: ROSUVASTATIN '20MG'$  TABLET] 90 tablet 0    Sig: TAKE (1) TABLET BY MOUTH AT BEDTIME.     Cardiovascular:  Antilipid - Statins 2 Failed - 11/08/2021 10:37 AM      Failed - Cr in normal range and within 360 days    Creat  Date Value Ref Range Status  06/23/2021 1.65 (H) 0.60 - 0.95 mg/dL Final         Failed - Lipid Panel in normal range within the last 12 months    Cholesterol  Date Value Ref Range Status  10/08/2019 163 <200 mg/dL Final   LDL Cholesterol (Calc)  Date Value Ref Range Status  10/08/2019 88 mg/dL (calc) Final    Comment:    Reference range: <100 . Desirable range <100 mg/dL for primary prevention;   <70 mg/dL for patients with CHD or diabetic patients  with > or = 2 CHD risk factors. Marland Kitchen LDL-C is now calculated using the Martin-Hopkins  calculation, which is a validated novel method providing  better accuracy than the Friedewald equation in the  estimation of LDL-C.  Cresenciano Genre et al. Annamaria Helling. 6546;503(54): 2061-2068  (http://education.[facility].com/faq/FAQ164)    HDL  Date Value Ref Range Status  10/08/2019 48 (L) > OR = 50 mg/dL Final   Triglycerides  Date Value Ref Range Status  10/08/2019 172 (H) <150 mg/dL Final         Passed - Patient is not pregnant      Passed - Valid encounter within last 12 months    Recent Outpatient Visits           4 months ago Stage 3b chronic kidney disease ([place])   [place] Pickard, Cammie Mcgee, MD   6 months ago UTI symptoms   [place] Eulogio Bear, NP   10 months ago Urinary  tract infection without hematuria, site unspecified   [place] [facility], Cammie Mcgee, MD   1 year ago CKD (chronic kidney disease) stage 4, GFR 15-29 ml/min ([place])   [facility] Annie Main, FNP

## 2021-11-29 ENCOUNTER — Other Ambulatory Visit: Payer: Self-pay | Admitting: Family Medicine

## 2021-11-29 DIAGNOSIS — Z8673 Personal history of transient ischemic attack (TIA), and cerebral infarction without residual deficits: Secondary | ICD-10-CM

## 2021-12-10 ENCOUNTER — Other Ambulatory Visit: Payer: Self-pay | Admitting: Family Medicine

## 2021-12-10 DIAGNOSIS — I10 Essential (primary) hypertension: Secondary | ICD-10-CM

## 2021-12-10 DIAGNOSIS — Z8673 Personal history of transient ischemic attack (TIA), and cerebral infarction without residual deficits: Secondary | ICD-10-CM

## 2021-12-12 NOTE — Telephone Encounter (Signed)
Requested medication (s) are due for refill today: Yes  Requested medication (s) are on the active medication list: Yes  Last refill:  11/09/21  Future visit scheduled: No  Notes to clinic:  Protocol indicates lab work is needed.    Requested Prescriptions  Pending Prescriptions Disp Refills   rosuvastatin (CRESTOR) 20 MG tablet [Pharmacy Med Name: ROSUVASTATIN CALCIUM 20 MG TAB] 30 tablet 0    Sig: TAKE (1) TABLET BY MOUTH AT BEDTIME.     Cardiovascular:  Antilipid - Statins 2 Failed - 12/10/2021 10:13 AM      Failed - Cr in normal range and within 360 days    Creat  Date Value Ref Range Status  06/23/2021 1.65 (H) 0.60 - 0.95 mg/dL Final         Failed - Lipid Panel in normal range within the last 12 months    Cholesterol  Date Value Ref Range Status  10/08/2019 163 <200 mg/dL Final   LDL Cholesterol (Calc)  Date Value Ref Range Status  10/08/2019 88 mg/dL (calc) Final    Comment:    Reference range: <100 . Desirable range <100 mg/dL for primary prevention;   <70 mg/dL for patients with CHD or diabetic patients  with > or = 2 CHD risk factors. Marland Kitchen LDL-C is now calculated using the Martin-Hopkins  calculation, which is a validated novel method providing  better accuracy than the Friedewald equation in the  estimation of LDL-C.  Cresenciano Genre et al. Annamaria Helling. 8101;751(02): 2061-2068  (http://education.[facility].com/faq/FAQ164)    HDL  Date Value Ref Range Status  10/08/2019 48 (L) > OR = 50 mg/dL Final   Triglycerides  Date Value Ref Range Status  10/08/2019 172 (H) <150 mg/dL Final         Passed - Patient is not pregnant      Passed - Valid encounter within last 12 months    Recent Outpatient Visits           5 months ago Stage 3b chronic kidney disease ([place])   [place] Pickard, Cammie Mcgee, MD   7 months ago UTI symptoms   [place] Eulogio Bear, NP   11 months ago Urinary tract infection without hematuria,  site unspecified   [place] Pickard, Cammie Mcgee, MD   2 years ago CKD (chronic kidney disease) stage 4, GFR 15-29 ml/min ([place])   [facility] Annie Main, FNP

## 2021-12-19 ENCOUNTER — Other Ambulatory Visit: Payer: PPO

## 2021-12-19 ENCOUNTER — Other Ambulatory Visit: Payer: Self-pay

## 2021-12-19 ENCOUNTER — Telehealth: Payer: Self-pay

## 2021-12-19 DIAGNOSIS — N390 Urinary tract infection, site not specified: Secondary | ICD-10-CM

## 2021-12-19 NOTE — Telephone Encounter (Signed)
Pt's spouse came into office to request to bring a urine sample in for pt this afternoon at his appt. Pt's spouse states pt possibly has a UTI.   Cb#: [phone]

## 2021-12-20 ENCOUNTER — Other Ambulatory Visit: Payer: Self-pay

## 2021-12-20 ENCOUNTER — Other Ambulatory Visit: Payer: PPO

## 2021-12-20 ENCOUNTER — Other Ambulatory Visit: Payer: Self-pay | Admitting: Family Medicine

## 2021-12-20 LAB — URINALYSIS, ROUTINE W REFLEX MICROSCOPIC
Bilirubin Urine: NEGATIVE
Glucose, UA: NEGATIVE
Hyaline Cast: NONE SEEN /LPF
Ketones, ur: NEGATIVE
Nitrite: NEGATIVE
Specific Gravity, Urine: 1.018 (ref 1.001–1.035)
pH: 5.5 (ref 5.0–8.0)

## 2021-12-20 MED ORDER — CEPHALEXIN 500 MG PO CAPS: 500.0000 mg | ORAL_CAPSULE | Freq: Three times a day (TID) | ORAL | 0 refills | Status: DC

## 2021-12-20 NOTE — Telephone Encounter (Signed)
Already done 12/19/21

## 2021-12-22 LAB — URINE CULTURE
MICRO NUMBER:: 13655754
SPECIMEN QUALITY:: ADEQUATE

## 2022-01-03 DIAGNOSIS — I739 Peripheral vascular disease, unspecified: Secondary | ICD-10-CM | POA: Diagnosis not present

## 2022-01-03 DIAGNOSIS — L603 Nail dystrophy: Secondary | ICD-10-CM | POA: Diagnosis not present

## 2022-02-16 ENCOUNTER — Other Ambulatory Visit: Payer: Self-pay | Admitting: Family Medicine

## 2022-02-16 ENCOUNTER — Other Ambulatory Visit: Payer: PPO

## 2022-02-16 ENCOUNTER — Telehealth: Payer: Self-pay

## 2022-02-16 DIAGNOSIS — N390 Urinary tract infection, site not specified: Secondary | ICD-10-CM

## 2022-02-16 LAB — URINALYSIS, ROUTINE W REFLEX MICROSCOPIC
Bilirubin Urine: NEGATIVE
Casts: NONE SEEN /LPF
Crystals: NONE SEEN /HPF
Glucose, UA: NEGATIVE
Hyaline Cast: NONE SEEN /LPF
Ketones, ur: NEGATIVE
Nitrite: NEGATIVE
Specific Gravity, Urine: 1.011 (ref 1.001–1.035)
WBC, UA: >=60 /HPF — AB (ref 0–5)
Yeast: NONE SEEN /HPF
pH: 7 (ref 5.0–8.0)

## 2022-02-16 LAB — MICROSCOPIC MESSAGE

## 2022-02-16 MED ORDER — CEPHALEXIN 500 MG PO CAPS: 500.0000 mg | ORAL_CAPSULE | Freq: Three times a day (TID) | ORAL | 0 refills | Status: DC

## 2022-02-16 NOTE — Telephone Encounter (Signed)
Pt's husband brought in urine to be checked for a UTI. Pt's husband states that pt was unable to come into office. Pt's husband was given a sterile cup for sample.  Cb#: [phone]

## 2022-02-19 LAB — URINE CULTURE
MICRO NUMBER:: 13917680
SPECIMEN QUALITY:: ADEQUATE

## 2022-02-20 ENCOUNTER — Other Ambulatory Visit: Payer: Self-pay

## 2022-02-20 ENCOUNTER — Telehealth: Payer: Self-pay

## 2022-02-20 DIAGNOSIS — N390 Urinary tract infection, site not specified: Secondary | ICD-10-CM

## 2022-02-20 MED ORDER — SULFAMETHOXAZOLE-TRIMETHOPRIM 800-160 MG PO TABS: 1.0000 | ORAL_TABLET | Freq: Two times a day (BID) | ORAL | 0 refills | Status: DC

## 2022-02-20 NOTE — Telephone Encounter (Signed)
Pt's husband called in to inquire about the change in the med (cephALEXin (KEFLEX) 500 MG capsule that was going to be sent to pharmacy for pt. Pt's spouse stated that he received a call from office stating that a different med would be sent in, pt's husband states that he has checked with pharmacy 2x's and no med has been sent as of yet. Please advise.   Cb#: [phone]

## 2022-02-27 DIAGNOSIS — H401123 Primary open-angle glaucoma, left eye, severe stage: Secondary | ICD-10-CM | POA: Diagnosis not present

## 2022-02-27 DIAGNOSIS — H401112 Primary open-angle glaucoma, right eye, moderate stage: Secondary | ICD-10-CM | POA: Diagnosis not present

## 2022-03-06 ENCOUNTER — Ambulatory Visit (INDEPENDENT_AMBULATORY_CARE_PROVIDER_SITE_OTHER): Payer: PPO | Admitting: Family Medicine

## 2022-03-06 VITALS — BP 132/74 | HR 67 | Temp 97.4°F | Ht 62.0 in | Wt 157.6 lb

## 2022-03-06 DIAGNOSIS — E782 Mixed hyperlipidemia: Secondary | ICD-10-CM

## 2022-03-06 DIAGNOSIS — I10 Essential (primary) hypertension: Secondary | ICD-10-CM

## 2022-03-06 DIAGNOSIS — N1832 Chronic kidney disease, stage 3b: Secondary | ICD-10-CM | POA: Diagnosis not present

## 2022-03-06 DIAGNOSIS — Z8673 Personal history of transient ischemic attack (TIA), and cerebral infarction without residual deficits: Secondary | ICD-10-CM

## 2022-03-06 MED ORDER — ROSUVASTATIN CALCIUM 20 MG PO TABS: ORAL_TABLET | ORAL | 3 refills | Status: DC

## 2022-03-06 NOTE — Progress Notes (Signed)
Subjective:    Patient ID: [name], female    DOB: [birth year], 87 y.o.   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  She also has a history of hypertension.  Recently I treated the patient for urinary tract infection with Keflex however her urine culture showed that she had an extended spectrum beta-lactamase resistant E. coli.  Therefore we had to switch the patient to Bactrim.  She denies any nausea or vomiting or abdominal pain.  She denies any CVA tenderness.  She denies any fevers or chills.  Husband states that her urine odor and color has gone back to normal.  She denies any chest pain or shortness of breath or dyspnea on exertion.  Her blood pressure today is excellent at 132/74 however she has stopped her Crestor.  Husband is not sure how this got discontinued but she has not been on it in some time. 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)   Past Surgical History:  Procedure Laterality Date   APPENDECTOMY     ESOPHAGEAL DILATION N/A 07/11/2018   Procedure: ESOPHAGEAL DILATION;  Surgeon: Rogene Houston, MD;  [facility];  Service: Endoscopy;  Laterality: N/A;   ESOPHAGOGASTRODUODENOSCOPY N/A 04/28/2018   Procedure: ESOPHAGOGASTRODUODENOSCOPY (EGD);  Surgeon: Rogene Houston, MD;  [facility];  Service: Endoscopy;  Laterality: N/A;   ESOPHAGOGASTRODUODENOSCOPY N/A 07/11/2018   Procedure: ESOPHAGOGASTRODUODENOSCOPY (EGD);  Surgeon: Rogene Houston, MD;  [facility];  Service: Endoscopy;  Laterality: N/A;  1200   HARDWARE REMOVAL Right 10/05/2014   Procedure: HARDWARE REMOVAL;  Surgeon: Dorna Leitz, MD;  Location: [place];  Service: Orthopedics;  Laterality: Right;   INTRAMEDULLARY (IM) NAIL INTERTROCHANTERIC Right 06/12/2014   Procedure: INTRAMEDULLARY (IM) NAIL INTERTROCHANTRIC RIGHT HIP;  Surgeon: Alta Corning, MD;  Location: [place];  Service: Orthopedics;  Laterality: Right;   kidney stones     LITHOTRIPSY     PEG PLACEMENT     and removal   PEG TUBE REMOVAL     RADIOLOGY WITH ANESTHESIA  04/22/2012   Procedure: RADIOLOGY WITH ANESTHESIA;  Surgeon: Rob Hickman, MD;  Location: [place];  Service: Radiology;  Laterality: N/A;  VERTEBRAL ARTERY STENT PLACEMENT AND CEREBRAL ANGIOGRAM   TONSILLECTOMY     TYMPANOPLASTY     Current Outpatient Medications on File Prior to Visit  Medication Sig Dispense Refill   amLODipine (NORVASC) 10 MG tablet Take 1 tablet (10 mg total) by mouth daily. 90 tablet 3   carvedilol (COREG) 25 MG tablet TAKE (1) TABLET BY MOUTH TWICE DAILY. 60 tablet 11   clopidogrel (PLAVIX) 75 MG tablet TAKE ONE TABLET BY MOUTH ONCE DAILY. 90 tablet 1   dorzolamide-timolol (COSOPT) 22.3-6.8 MG/ML ophthalmic solution Place 1 drop into both eyes 2 (two) times daily.     Multiple Vitamins-Minerals (CENTRUM ULTRA WOMENS) TABS Take 1 tablet by  mouth daily.       pantoprazole (PROTONIX) 40 MG tablet TAKE (1) TABLET BY MOUTH ONCE DAILY. 90 tablet 3   ROCKLATAN 0.02-0.005 % SOLN Apply 1 drop to eye daily.     senna (SENOKOT) 8.6 MG tablet Take 1 tablet by mouth at bedtime.      sulfamethoxazole-trimethoprim (BACTRIM DS) 800-160 MG tablet Take 1 tablet by mouth 2 (two) times daily. 14 tablet 0   valsartan (DIOVAN) 320 MG tablet Take 1 tablet (320 mg total) by mouth daily. 90 tablet 3   cephALEXin (KEFLEX) 500 MG capsule Take 1 capsule (500 mg total) by mouth 3 (three) times daily. (Patient not taking: Reported on 03/06/2022) 21 capsule 0    rosuvastatin (CRESTOR) 20 MG tablet TAKE (1) TABLET BY MOUTH AT BEDTIME. (Patient not taking: Reported on 03/06/2022) 30 tablet 0   No current facility-administered medications on file prior to visit.   No Known Allergies  Social History   Socioeconomic History   Marital status: Married    Spouse name: [name]   Number of children: 0   Years of education: Not on file   Highest education level: Not on file  Occupational History    Comment: Retired Therapist, sports  Tobacco Use   Smoking status: Former    Types: Cigarettes    Quit date: 10/02/1983    Years since quitting: 38.4   Smokeless tobacco: Never  Vaping Use   Vaping Use: Never used  Substance and Sexual Activity   Alcohol use: Not Currently   Drug use: No   Sexual activity: Not Currently  Other Topics Concern   Not on file  Social History Narrative   Lives with husband [name].   2 step children.   Social Determinants of Health   Financial Resource Strain: Low Risk  (08/12/2021)   Overall Financial Resource Strain (CARDIA)    Difficulty of Paying Living Expenses: Not hard at all  Food Insecurity: No Food Insecurity (08/12/2021)   Hunger Vital Sign    Worried About Running Out of Food in the Last Year: Never true    Ran Out of Food in the Last Year: Never true  Transportation Needs: No Transportation Needs (08/12/2021)   PRAPARE - Hydrologist (Medical): No    Lack of Transportation (Non-Medical): No  Physical Activity: Inactive (08/12/2021)   Exercise Vital Sign    Days of Exercise per Week: 0 days    Minutes of Exercise per Session: 0 min  Stress: No Stress Concern Present (08/12/2021)   [place]    Feeling of Stress : Not at all  Social Connections: Moderately Integrated (08/12/2021)   Social Connection and Isolation Panel [NHANES]    Frequency of Communication with Friends and Family: More than three times a week    Frequency of  Social Gatherings with Friends and Family: More than three times a week    Attends Religious Services: 1 to 4 times per year    Active Member of [employer] or Organizations: No    Attends Archivist Meetings: Never    Marital Status: Married  Human resources officer Violence: Not At Risk (08/12/2021)   Humiliation, Afraid, Rape, and Kick questionnaire    Fear of Current or Ex-Partner: No    Emotionally Abused: No    Physically Abused: No    Sexually Abused: No     Review of Systems  All other systems reviewed and are negative.  Objective:   Physical Exam Vitals reviewed.  Constitutional:      General: She is not in acute distress.    Appearance: Normal appearance. She is normal weight. She is not ill-appearing or toxic-appearing.  Cardiovascular:     Rate and Rhythm: Normal rate and regular rhythm.     Pulses: Normal pulses.     Heart sounds: Normal heart sounds. No murmur heard.    No friction rub. No gallop.  Pulmonary:     Effort: Pulmonary effort is normal. No respiratory distress.     Breath sounds: Normal breath sounds. No stridor. No wheezing, rhonchi or rales.  Abdominal:     General: Abdomen is flat. Bowel sounds are normal.     Palpations: Abdomen is soft.  Musculoskeletal:     Right lower leg: No edema.     Left lower leg: No edema.  Neurological:     Mental Status: She is alert.           Assessment & Plan:   Essential hypertension - Plan: rosuvastatin (CRESTOR) 20 MG tablet  Stage 3b chronic kidney disease ([place]) - Plan: CBC with Differential/Platelet, Lipid panel, COMPLETE METABOLIC PANEL WITH GFR  History of CVA (cerebrovascular accident) - Plan: CBC with Differential/Platelet, Lipid panel, COMPLETE METABOLIC PANEL WITH GFR, rosuvastatin (CRESTOR) 20 MG tablet  Mixed hyperlipidemia - Plan: CBC with Differential/Platelet, Lipid panel, COMPLETE METABOLIC PANEL WITH GFR Very happy with her blood pressure.  I would like to check a CBC a CMP and a  lipid panel.  Her goal LDL cholesterol is less than 70.  I encouraged the patient and her husband to treat her urinary tract infections soon as possible to help prevent further kidney damage given her history of stage IIIb chronic kidney disease.  The patient received her flu shot today.  Resume Crestor 20 mg a day

## 2022-03-07 LAB — LIPID PANEL
Cholesterol: 270 mg/dL — ABNORMAL HIGH (ref <200)
HDL: 35 mg/dL — ABNORMAL LOW (ref >=50)
LDL Cholesterol (Calc): 188 mg/dL (calc) — ABNORMAL HIGH
Non-HDL Cholesterol (Calc): 235 mg/dL (calc) — ABNORMAL HIGH (ref <130)
Total CHOL/HDL Ratio: 7.7 (calc) — ABNORMAL HIGH (ref <5.0)
Triglycerides: 265 mg/dL — ABNORMAL HIGH (ref <150)

## 2022-03-07 LAB — CBC WITH DIFFERENTIAL/PLATELET
Absolute Monocytes: 645 cells/uL (ref 200–950)
Basophils Absolute: 150 cells/uL (ref 0–200)
Basophils Relative: 2 %
Eosinophils Absolute: 413 cells/uL (ref 15–500)
Eosinophils Relative: 5.5 %
HCT: 39.2 % (ref 35.0–45.0)
Hemoglobin: 12.5 g/dL (ref 11.7–15.5)
Lymphs Abs: 1568 cells/uL (ref 850–3900)
MCH: 25.8 pg — ABNORMAL LOW (ref 27.0–33.0)
MCHC: 31.9 g/dL — ABNORMAL LOW (ref 32.0–36.0)
MCV: 80.8 fL (ref 80.0–100.0)
MPV: 9.7 fL (ref 7.5–12.5)
Monocytes Relative: 8.6 %
Neutro Abs: 4725 cells/uL (ref 1500–7800)
Neutrophils Relative %: 63 %
Platelets: 369 10*3/uL (ref 140–400)
RBC: 4.85 10*6/uL (ref 3.80–5.10)
RDW: 12.6 % (ref 11.0–15.0)
Total Lymphocyte: 20.9 %
WBC: 7.5 10*3/uL (ref 3.8–10.8)

## 2022-03-07 LAB — COMPLETE METABOLIC PANEL WITH GFR
AG Ratio: 1.3 (calc) (ref 1.0–2.5)
ALT: 9 U/L (ref 6–29)
AST: 12 U/L (ref 10–35)
Albumin: 3.6 g/dL (ref 3.6–5.1)
Alkaline phosphatase (APISO): 123 U/L (ref 37–153)
BUN/Creatinine Ratio: 12 (calc) (ref 6–22)
BUN: 25 mg/dL (ref 7–25)
CO2: 25 mmol/L (ref 20–32)
Calcium: 9 mg/dL (ref 8.6–10.4)
Chloride: 108 mmol/L (ref 98–110)
Creat: 2.04 mg/dL — ABNORMAL HIGH (ref 0.60–0.95)
Globulin: 2.8 g/dL (calc) (ref 1.9–3.7)
Glucose, Bld: 80 mg/dL (ref 65–99)
Potassium: 4.8 mmol/L (ref 3.5–5.3)
Sodium: 141 mmol/L (ref 135–146)
Total Bilirubin: 0.4 mg/dL (ref 0.2–1.2)
Total Protein: 6.4 g/dL (ref 6.1–8.1)
eGFR: 24 mL/min/{1.73_m2} — ABNORMAL LOW (ref >=60)

## 2022-03-10 ENCOUNTER — Telehealth: Payer: Self-pay | Admitting: Pharmacist

## 2022-03-10 NOTE — Progress Notes (Signed)
    Chronic Care Management Pharmacy Assistant   Name: [name]  MRN: [id number] DOB: August 04, 1934   Reason for Encounter: Disease State - Hypertension Call     Recent office visits:  03/06/22 Jenna Luo, MD - Family Medicine - Hypertension - Labs were ordered. Flu vaccine administered during visit. Resume Crestor 20 mg daily. Follow up as scheduled.   Recent consult visits:  None noted.   Hospital visits:  None in previous 6 months  Medications: Outpatient Encounter Medications as of 03/10/2022  Medication Sig   amLODipine (NORVASC) 10 MG tablet Take 1 tablet (10 mg total) by mouth daily.   carvedilol (COREG) 25 MG tablet TAKE (1) TABLET BY MOUTH TWICE DAILY.   cephALEXin (KEFLEX) 500 MG capsule Take 1 capsule (500 mg total) by mouth 3 (three) times daily. (Patient not taking: Reported on 03/06/2022)   clopidogrel (PLAVIX) 75 MG tablet TAKE ONE TABLET BY MOUTH ONCE DAILY.   dorzolamide-timolol (COSOPT) 22.3-6.8 MG/ML ophthalmic solution Place 1 drop into both eyes 2 (two) times daily.   Multiple Vitamins-Minerals (CENTRUM ULTRA WOMENS) TABS Take 1 tablet by mouth daily.     pantoprazole (PROTONIX) 40 MG tablet TAKE (1) TABLET BY MOUTH ONCE DAILY.   ROCKLATAN 0.02-0.005 % SOLN Apply 1 drop to eye daily.   rosuvastatin (CRESTOR) 20 MG tablet TAKE (1) TABLET BY MOUTH AT BEDTIME.   senna (SENOKOT) 8.6 MG tablet Take 1 tablet by mouth at bedtime.    sulfamethoxazole-trimethoprim (BACTRIM DS) 800-160 MG tablet Take 1 tablet by mouth 2 (two) times daily.   valsartan (DIOVAN) 320 MG tablet Take 1 tablet (320 mg total) by mouth daily.   No facility-administered encounter medications on file as of 03/10/2022.   Current antihypertensive regimen:  Carvedilol '25mg'$  BID Lisinopril '10mg'$  daily  How often are you checking your Blood Pressure?  Patient reported checking blood pressures daily  Current home BP readings: 130/50 (husband reported)    What recent interventions/DTPs  have been made by any provider to improve Blood Pressure control since last CPP Visit:  Patient husband denied any recent changes in medications since last visit with CPP    Any recent hospitalizations or ED visits since last visit with CPP? Patient husband reported has not had any hospitalizations or ED visits since last visit with CPP    What diet changes have been made to improve Blood Pressure Control?  Patient husband reported limiting her sodium intake in her diet.    What exercise is being done to improve your Blood Pressure Control?   Patient husband reported patient is active as possible.    Adherence Review: Is the patient currently on ACE/ARB medication? Yes Does the patient have >5 day gap between last estimated fill dates? No   Care Gaps   AWV: done 08/12/21 Colonoscopy: upper EGD done 07/11/18 DM Eye Exam: N/A DM Foot Exam: N/A Microalbumin: N/A HbgAIC:N/A DEXA: unknown Mammogram: unknown   Star Rating Drugs: Lisinopril (ZESTRIL) 10 MG tablet - last filled 03/06/22 90 days Rosuvastatin (CRESTOR) 20 MG tablet - last filled 08/08/21 90 days     Future Appointments  Date Time Provider [place]  06/07/2022  8:00 AM [facility]-BSUMMIT LAB [facility]  08/18/2022  3:30 PM [facility]-NURSE HEALTH ADVISOR [facility]  09/28/2022  2:00 PM [facility]-CCM PHARMACIST [facility]     Jobe Gibbon, [place] Clinical Pharmacist Assistant  [phone]

## 2022-03-14 DIAGNOSIS — L603 Nail dystrophy: Secondary | ICD-10-CM | POA: Diagnosis not present

## 2022-03-14 DIAGNOSIS — I739 Peripheral vascular disease, unspecified: Secondary | ICD-10-CM | POA: Diagnosis not present

## 2022-03-24 ENCOUNTER — Other Ambulatory Visit: Payer: Self-pay

## 2022-03-24 ENCOUNTER — Other Ambulatory Visit: Payer: PPO

## 2022-03-24 ENCOUNTER — Telehealth: Payer: Self-pay

## 2022-03-24 ENCOUNTER — Other Ambulatory Visit: Payer: Self-pay | Admitting: Family Medicine

## 2022-03-24 DIAGNOSIS — N390 Urinary tract infection, site not specified: Secondary | ICD-10-CM

## 2022-03-24 LAB — URINALYSIS, ROUTINE W REFLEX MICROSCOPIC
Bilirubin Urine: NEGATIVE
Casts: NONE SEEN /LPF
Crystals: NONE SEEN /HPF
Glucose, UA: NEGATIVE
Hyaline Cast: NONE SEEN /LPF
Ketones, ur: NEGATIVE
Nitrite: NEGATIVE
Specific Gravity, Urine: 1.01 (ref 1.001–1.035)
Yeast: NONE SEEN /HPF
pH: 5.5 (ref 5.0–8.0)

## 2022-03-24 LAB — MICROSCOPIC MESSAGE

## 2022-03-24 MED ORDER — SULFAMETHOXAZOLE-TRIMETHOPRIM 800-160 MG PO TABS: 1.0000 | ORAL_TABLET | Freq: Two times a day (BID) | ORAL | 0 refills | Status: DC

## 2022-03-24 NOTE — Telephone Encounter (Signed)
Call husband and advice that Dr. Dennard Schaumann has sent antibiotics to pharmacy. Nothing further.

## 2022-03-24 NOTE — Telephone Encounter (Signed)
Pt's spouse came in to see if pcp would test pt's urine for a UTI. Pt has supplied a urine sample in a sterile cup provided by office. Please advise.  Cb#; [phone]

## 2022-03-28 LAB — URINE CULTURE
MICRO NUMBER:: 14079915
SPECIMEN QUALITY:: ADEQUATE

## 2022-05-23 DIAGNOSIS — L603 Nail dystrophy: Secondary | ICD-10-CM | POA: Diagnosis not present

## 2022-05-23 DIAGNOSIS — I739 Peripheral vascular disease, unspecified: Secondary | ICD-10-CM | POA: Diagnosis not present

## 2022-06-07 ENCOUNTER — Other Ambulatory Visit: Payer: Self-pay | Admitting: Family Medicine

## 2022-06-07 ENCOUNTER — Other Ambulatory Visit: Payer: PPO

## 2022-06-07 DIAGNOSIS — I10 Essential (primary) hypertension: Secondary | ICD-10-CM | POA: Diagnosis not present

## 2022-06-07 DIAGNOSIS — E782 Mixed hyperlipidemia: Secondary | ICD-10-CM | POA: Diagnosis not present

## 2022-06-08 LAB — COMPLETE METABOLIC PANEL WITH GFR
AG Ratio: 1.3 (calc) (ref 1.0–2.5)
ALT: 8 U/L (ref 6–29)
AST: 12 U/L (ref 10–35)
Albumin: 3.5 g/dL — ABNORMAL LOW (ref 3.6–5.1)
Alkaline phosphatase (APISO): 119 U/L (ref 37–153)
BUN/Creatinine Ratio: 7 (calc) (ref 6–22)
BUN: 18 mg/dL (ref 7–25)
CO2: 24 mmol/L (ref 20–32)
Calcium: 8.6 mg/dL (ref 8.6–10.4)
Chloride: 109 mmol/L (ref 98–110)
Creat: 2.49 mg/dL — ABNORMAL HIGH (ref 0.60–0.95)
Globulin: 2.8 g/dL (calc) (ref 1.9–3.7)
Glucose, Bld: 97 mg/dL (ref 65–99)
Potassium: 4.3 mmol/L (ref 3.5–5.3)
Sodium: 145 mmol/L (ref 135–146)
Total Bilirubin: 0.3 mg/dL (ref 0.2–1.2)
Total Protein: 6.3 g/dL (ref 6.1–8.1)
eGFR: 18 mL/min/{1.73_m2} — ABNORMAL LOW (ref >=60)

## 2022-06-08 LAB — CBC WITH DIFFERENTIAL/PLATELET
Absolute Monocytes: 470 cells/uL (ref 200–950)
Basophils Absolute: 108 cells/uL (ref 0–200)
Basophils Relative: 2 %
Eosinophils Absolute: 464 cells/uL (ref 15–500)
Eosinophils Relative: 8.6 %
HCT: 33.8 % — ABNORMAL LOW (ref 35.0–45.0)
Hemoglobin: 10.6 g/dL — ABNORMAL LOW (ref 11.7–15.5)
Lymphs Abs: 1307 cells/uL (ref 850–3900)
MCH: 25.7 pg — ABNORMAL LOW (ref 27.0–33.0)
MCHC: 31.4 g/dL — ABNORMAL LOW (ref 32.0–36.0)
MCV: 81.8 fL (ref 80.0–100.0)
MPV: 9.9 fL (ref 7.5–12.5)
Monocytes Relative: 8.7 %
Neutro Abs: 3051 cells/uL (ref 1500–7800)
Neutrophils Relative %: 56.5 %
Platelets: 267 10*3/uL (ref 140–400)
RBC: 4.13 10*6/uL (ref 3.80–5.10)
RDW: 13.3 % (ref 11.0–15.0)
Total Lymphocyte: 24.2 %
WBC: 5.4 10*3/uL (ref 3.8–10.8)

## 2022-06-08 LAB — LIPID PANEL
Cholesterol: 152 mg/dL (ref <200)
HDL: 48 mg/dL — ABNORMAL LOW (ref >=50)
LDL Cholesterol (Calc): 82 mg/dL (calc)
Non-HDL Cholesterol (Calc): 104 mg/dL (calc) (ref <130)
Total CHOL/HDL Ratio: 3.2 (calc) (ref <5.0)
Triglycerides: 127 mg/dL (ref <150)

## 2022-06-13 ENCOUNTER — Ambulatory Visit (INDEPENDENT_AMBULATORY_CARE_PROVIDER_SITE_OTHER): Payer: PPO | Admitting: Family Medicine

## 2022-06-13 ENCOUNTER — Encounter: Payer: Self-pay | Admitting: Family Medicine

## 2022-06-13 VITALS — BP 130/82 | HR 67 | Ht 62.0 in | Wt 157.0 lb

## 2022-06-13 DIAGNOSIS — R7989 Other specified abnormal findings of blood chemistry: Secondary | ICD-10-CM | POA: Diagnosis not present

## 2022-06-13 NOTE — Progress Notes (Signed)
Subjective:    Patient ID: [name], female    DOB: 05/18/1935, 88 y.o.   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  She also has a history of hypertension.  Recently the creatinine has risen substantially.  Last year her creatinine was typically 1.5-1.6.  In October around the time of urinary tract infection it had risen to 2.  On her most recent lab work it is risen to 2.5.  Her blood pressure has been elevated.  Her goal blood pressure is less than 130/80 given her chronic kidney disease however she refuses to take her amlodipine.  Her blood pressure at home is typically 814-481 systolic.  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)   Past Surgical History:  Procedure Laterality Date   APPENDECTOMY     ESOPHAGEAL DILATION N/A 07/11/2018   Procedure: ESOPHAGEAL DILATION;  Surgeon: Rogene Houston, MD;  [facility];  Service: Endoscopy;  Laterality: N/A;   ESOPHAGOGASTRODUODENOSCOPY N/A 04/28/2018   Procedure: ESOPHAGOGASTRODUODENOSCOPY (EGD);  Surgeon: Rogene Houston, MD;  [facility];  Service: Endoscopy;  Laterality: N/A;   ESOPHAGOGASTRODUODENOSCOPY N/A 07/11/2018   Procedure: ESOPHAGOGASTRODUODENOSCOPY (EGD);  Surgeon: Rogene Houston, MD;  [facility];  Service: Endoscopy;  Laterality: N/A;  1200   HARDWARE REMOVAL Right 10/05/2014   Procedure: HARDWARE REMOVAL;  Surgeon: Dorna Leitz, MD;  Location: [place];   Service: Orthopedics;  Laterality: Right;   INTRAMEDULLARY (IM) NAIL INTERTROCHANTERIC Right 06/12/2014   Procedure: INTRAMEDULLARY (IM) NAIL INTERTROCHANTRIC RIGHT HIP;  Surgeon: Alta Corning, MD;  Location: [place];  Service: Orthopedics;  Laterality: Right;   kidney stones     LITHOTRIPSY     PEG PLACEMENT     and removal   PEG TUBE REMOVAL     RADIOLOGY WITH ANESTHESIA  04/22/2012   Procedure: RADIOLOGY WITH ANESTHESIA;  Surgeon: Rob Hickman, MD;  Location: [place];  Service: Radiology;  Laterality: N/A;  VERTEBRAL ARTERY STENT PLACEMENT AND CEREBRAL ANGIOGRAM   TONSILLECTOMY     TYMPANOPLASTY     Current Outpatient Medications on File Prior to Visit  Medication Sig Dispense Refill   amLODipine (NORVASC) 10 MG tablet Take 1 tablet (10 mg total) by mouth daily. 90 tablet 3   carvedilol (COREG) 25 MG tablet TAKE (1) TABLET BY MOUTH TWICE DAILY. 60 tablet 0   clopidogrel (PLAVIX) 75 MG tablet TAKE ONE TABLET BY MOUTH ONCE DAILY. 90 tablet 1   dorzolamide-timolol (COSOPT) 22.3-6.8 MG/ML ophthalmic solution Place 1 drop into both eyes 2 (two) times daily.     Multiple Vitamins-Minerals (CENTRUM ULTRA WOMENS) TABS Take 1 tablet by mouth daily.       pantoprazole (PROTONIX) 40 MG tablet TAKE (1) TABLET BY MOUTH ONCE DAILY. 90 tablet 3   ROCKLATAN 0.02-0.005 % SOLN Apply 1 drop to  eye daily.     rosuvastatin (CRESTOR) 20 MG tablet TAKE (1) TABLET BY MOUTH AT BEDTIME. 90 tablet 3   senna (SENOKOT) 8.6 MG tablet Take 1 tablet by mouth at bedtime.      sulfamethoxazole-trimethoprim (BACTRIM DS) 800-160 MG tablet Take 1 tablet by mouth 2 (two) times daily. 14 tablet 0   valsartan (DIOVAN) 320 MG tablet Take 1 tablet (320 mg total) by mouth daily. 90 tablet 3   No current facility-administered medications on file prior to visit.   No Known Allergies  Social History   Socioeconomic History   Marital status: Married    Spouse name: [name]   Number of children: 0   Years of education: Not  on file   Highest education level: Not on file  Occupational History    Comment: Retired Therapist, sports  Tobacco Use   Smoking status: Former    Types: Cigarettes    Quit date: 10/02/1983    Years since quitting: 38.7   Smokeless tobacco: Never  Vaping Use   Vaping Use: Never used  Substance and Sexual Activity   Alcohol use: Not Currently   Drug use: No   Sexual activity: Not Currently  Other Topics Concern   Not on file  Social History Narrative   Lives with husband [name].   2 step children.   Social Determinants of Health   Financial Resource Strain: Low Risk  (08/12/2021)   Overall Financial Resource Strain (CARDIA)    Difficulty of Paying Living Expenses: Not hard at all  Food Insecurity: No Food Insecurity (08/12/2021)   Hunger Vital Sign    Worried About Running Out of Food in the Last Year: Never true    Ran Out of Food in the Last Year: Never true  Transportation Needs: No Transportation Needs (08/12/2021)   PRAPARE - Hydrologist (Medical): No    Lack of Transportation (Non-Medical): No  Physical Activity: Inactive (08/12/2021)   Exercise Vital Sign    Days of Exercise per Week: 0 days    Minutes of Exercise per Session: 0 min  Stress: No Stress Concern Present (08/12/2021)   [place]    Feeling of Stress : Not at all  Social Connections: Moderately Integrated (08/12/2021)   Social Connection and Isolation Panel [NHANES]    Frequency of Communication with Friends and Family: More than three times a week    Frequency of Social Gatherings with Friends and Family: More than three times a week    Attends Religious Services: 1 to 4 times per year    Active Member of [employer] or Organizations: No    Attends Archivist Meetings: Never    Marital Status: Married  Human resources officer Violence: Not At Risk (08/12/2021)   Humiliation, Afraid, Rape, and Kick questionnaire    Fear of  Current or Ex-Partner: No    Emotionally Abused: No    Physically Abused: No    Sexually Abused: No     Review of Systems  All other systems reviewed and are negative.      Objective:   Physical Exam Vitals reviewed.  Constitutional:      General: She is not in acute distress.    Appearance: Normal appearance. She is normal weight. She is not ill-appearing or toxic-appearing.  Cardiovascular:     Rate and Rhythm: Normal rate and regular rhythm.     Pulses: Normal pulses.     Heart  sounds: Normal heart sounds. No murmur heard.    No friction rub. No gallop.  Pulmonary:     Effort: Pulmonary effort is normal. No respiratory distress.     Breath sounds: Normal breath sounds. No stridor. No wheezing, rhonchi or rales.  Abdominal:     General: Abdomen is flat. Bowel sounds are normal.     Palpations: Abdomen is soft.  Musculoskeletal:     Right lower leg: No edema.     Left lower leg: No edema.  Neurological:     Mental Status: She is alert.           Assessment & Plan:   Elevated serum creatinine - Plan: Urinalysis, Routine w reflex microscopic, Protein / Creatinine Ratio, Urine Not certain of the cause of her elevated creatinine.  First check a urinalysis for any evidence of a UTI.  Second check a urine protein to creatinine ratio.  If there is no UTI, and her urine protein ratio is elevated, consider [place] however she does have a history of frequent UTIs that this may not be beneficial for her.  The other option would be to more tightly controlled blood pressure to try to keep her systolic blood pressure less than 237 and her diastolic blood pressure less than 80.  I would encourage her to take the amlodipine consistently if this is the case.  Weight loss of frequent test.

## 2022-06-14 ENCOUNTER — Other Ambulatory Visit: Payer: PPO

## 2022-06-14 DIAGNOSIS — R7989 Other specified abnormal findings of blood chemistry: Secondary | ICD-10-CM | POA: Diagnosis not present

## 2022-06-15 LAB — URINALYSIS, ROUTINE W REFLEX MICROSCOPIC
Bilirubin Urine: NEGATIVE
Glucose, UA: NEGATIVE
Hyaline Cast: NONE SEEN /LPF
Ketones, ur: NEGATIVE
Nitrite: POSITIVE — AB
Specific Gravity, Urine: 1.016 (ref 1.001–1.035)
WBC, UA: >=60 /HPF — AB (ref 0–5)
pH: 6 (ref 5.0–8.0)

## 2022-06-15 LAB — PROTEIN / CREATININE RATIO, URINE
Creatinine, Urine: 122 mg/dL (ref 20–275)
Protein/Creat Ratio: 689 mg/g creat — ABNORMAL HIGH (ref 24–184)
Protein/Creatinine Ratio: 0.689 mg/mg creat — ABNORMAL HIGH (ref 0.024–0.184)
Total Protein, Urine: 84 mg/dL — ABNORMAL HIGH (ref 5–24)

## 2022-06-15 LAB — MICROSCOPIC MESSAGE

## 2022-06-16 ENCOUNTER — Other Ambulatory Visit: Payer: Self-pay

## 2022-06-16 DIAGNOSIS — N390 Urinary tract infection, site not specified: Secondary | ICD-10-CM

## 2022-06-16 MED ORDER — CEPHALEXIN 500 MG PO CAPS: 500.0000 mg | ORAL_CAPSULE | Freq: Three times a day (TID) | ORAL | 0 refills | Status: DC

## 2022-06-22 ENCOUNTER — Other Ambulatory Visit: Payer: PPO

## 2022-06-22 DIAGNOSIS — I10 Essential (primary) hypertension: Secondary | ICD-10-CM | POA: Diagnosis not present

## 2022-06-22 DIAGNOSIS — N179 Acute kidney failure, unspecified: Secondary | ICD-10-CM

## 2022-06-22 DIAGNOSIS — N390 Urinary tract infection, site not specified: Secondary | ICD-10-CM

## 2022-06-23 DIAGNOSIS — N179 Acute kidney failure, unspecified: Secondary | ICD-10-CM | POA: Diagnosis not present

## 2022-06-23 DIAGNOSIS — N390 Urinary tract infection, site not specified: Secondary | ICD-10-CM | POA: Diagnosis not present

## 2022-06-23 LAB — BASIC METABOLIC PANEL
BUN/Creatinine Ratio: 11 (calc) (ref 6–22)
BUN: 27 mg/dL — ABNORMAL HIGH (ref 7–25)
CO2: 25 mmol/L (ref 20–32)
Calcium: 8.9 mg/dL (ref 8.6–10.4)
Chloride: 111 mmol/L — ABNORMAL HIGH (ref 98–110)
Creat: 2.36 mg/dL — ABNORMAL HIGH (ref 0.60–0.95)
Glucose, Bld: 97 mg/dL (ref 65–99)
Potassium: 4.5 mmol/L (ref 3.5–5.3)
Sodium: 146 mmol/L (ref 135–146)

## 2022-06-24 LAB — URINALYSIS, ROUTINE W REFLEX MICROSCOPIC
Bilirubin Urine: NEGATIVE
Glucose, UA: NEGATIVE
Hyaline Cast: NONE SEEN /LPF
Ketones, ur: NEGATIVE
Nitrite: NEGATIVE
Specific Gravity, Urine: 1.018 (ref 1.001–1.035)
WBC, UA: >=60 /HPF — AB (ref 0–5)
pH: 6 (ref 5.0–8.0)

## 2022-06-24 LAB — URINE CULTURE
MICRO NUMBER:: 14448193
SPECIMEN QUALITY:: ADEQUATE

## 2022-06-24 LAB — MICROSCOPIC MESSAGE

## 2022-06-26 ENCOUNTER — Other Ambulatory Visit: Payer: Self-pay | Admitting: Family Medicine

## 2022-06-26 ENCOUNTER — Telehealth: Payer: Self-pay

## 2022-06-26 DIAGNOSIS — R531 Weakness: Secondary | ICD-10-CM | POA: Diagnosis not present

## 2022-06-26 DIAGNOSIS — R52 Pain, unspecified: Secondary | ICD-10-CM | POA: Diagnosis not present

## 2022-06-26 DIAGNOSIS — U071 COVID-19: Secondary | ICD-10-CM | POA: Diagnosis not present

## 2022-06-26 DIAGNOSIS — R5381 Other malaise: Secondary | ICD-10-CM | POA: Diagnosis not present

## 2022-06-26 NOTE — Telephone Encounter (Signed)
Pt's step daughter called in to ask if pt could get a referral for [facility]. Pt's daughter states that her dad is currently hospitalized with Covid and bacterial PNA. Pt's daughter is concerned because pt's husband is her primary caregiver and is now home alone. Please advise.  Cb#: Nathaniel Man, [phone]

## 2022-06-27 ENCOUNTER — Telehealth: Payer: Self-pay

## 2022-06-27 ENCOUNTER — Other Ambulatory Visit: Payer: Self-pay | Admitting: Family Medicine

## 2022-06-27 MED ORDER — MOLNUPIRAVIR EUA 200MG CAPSULE: 4.0000 | ORAL_CAPSULE | Freq: Two times a day (BID) | ORAL | 0 refills | Status: AC

## 2022-06-27 NOTE — Telephone Encounter (Signed)
Pt's husband called in from hospital to let pcp know that pt has tested positive for covid. Pt is running a fever, along with some other symptoms. Pt's husband would like to know if Paxlovid is something that pcp would send to pharmacy for pt. Pt's husband would like for nurse to give him a cb please. Please advise.  Cb#: [phone]

## 2022-07-03 ENCOUNTER — Other Ambulatory Visit: Payer: Self-pay

## 2022-07-03 DIAGNOSIS — R4701 Aphasia: Secondary | ICD-10-CM

## 2022-07-03 DIAGNOSIS — Z8673 Personal history of transient ischemic attack (TIA), and cerebral infarction without residual deficits: Secondary | ICD-10-CM

## 2022-07-03 DIAGNOSIS — R55 Syncope and collapse: Secondary | ICD-10-CM

## 2022-07-03 NOTE — Telephone Encounter (Signed)
Pt's step daughter Nathaniel Man called in with info about the [place] she wanted to choose for pt. [facility] is the agency that pt would like to go with for her [facility] needs. Pt's step daughter also stated that pt's husband has been hospitalized again and is helping her with these decisions. Pt's daughter would like a cb from nurse just to get an understanding of what will happen next for pt. Please advise.  Cb#: [phone]

## 2022-07-13 ENCOUNTER — Other Ambulatory Visit: Payer: Self-pay | Admitting: Family Medicine

## 2022-07-14 ENCOUNTER — Telehealth: Payer: Self-pay | Admitting: Pharmacist

## 2022-07-14 NOTE — Progress Notes (Signed)
Care Management & Coordination Services Pharmacy Team   Reason for Encounter: Hypertension   Contacted patient to discuss hypertension disease state. Unsuccessful outreach. Left voicemail for patient to return call.    Current antihypertensive regimen:  Carvedilol '25mg'$  BID Valsartan '320mg'$  Amlodipine '10mg'$   Patient verbally confirms she is taking the above medications as directed.   How often are you checking your Blood Pressure?   she checks her blood pressure  taking her medication.  Current home BP readings:   DATE:             BP               PULSE   Wrist or arm cuff: Caffeine intake: Salt intake: OTC medications including pseudoephedrine or NSAIDs?  Any readings above 180/100?  If yes any symptoms of hypertensive emergency?   What recent interventions/DTPs have been made by any provider to improve Blood Pressure control since last CPP Visit:  Patient was advised to take Amlodipine '10mg'$  more regularly for better control of blood pressure on 06/13/22.  Any recent hospitalizations or ED visits since last visit with CPP? No  What diet changes have been made to improve Blood Pressure Control?  Patient reported  What exercise is being done to improve your Blood Pressure Control?  Patient reported  Adherence Review: Is the patient currently on ACE/ARB medication? Yes Does the patient have >5 day gap between last estimated fill dates? No  Star Rating Drugs:  Medication:   Last Fill: Day Supply  Rosuvastatin 20 MG tablet  06/07/22  90 Valsartan 320 MG tablet  04/07/22  90  Chart Updates: Recent office visits:  06/13/22 Jenna Luo, MD - Family Medicine - Elevated creatine - Labs were ordered.   Recent consult visits:  None noted.   Hospital visits:  None in previous 6 months  Medications: Outpatient Encounter Medications as of 07/14/2022  Medication Sig   amLODipine (NORVASC) 10 MG tablet Take 1 tablet (10 mg total) by mouth daily.   carvedilol (COREG) 25 MG  tablet TAKE (1) TABLET BY MOUTH TWICE DAILY.   cephALEXin (KEFLEX) 500 MG capsule Take 1 capsule (500 mg total) by mouth 3 (three) times daily.   clopidogrel (PLAVIX) 75 MG tablet TAKE ONE TABLET BY MOUTH ONCE DAILY.   dorzolamide-timolol (COSOPT) 22.3-6.8 MG/ML ophthalmic solution Place 1 drop into both eyes 2 (two) times daily.   Multiple Vitamins-Minerals (CENTRUM ULTRA WOMENS) TABS Take 1 tablet by mouth daily.     pantoprazole (PROTONIX) 40 MG tablet TAKE (1) TABLET BY MOUTH ONCE DAILY.   ROCKLATAN 0.02-0.005 % SOLN Apply 1 drop to eye daily.   rosuvastatin (CRESTOR) 20 MG tablet TAKE (1) TABLET BY MOUTH AT BEDTIME.   senna (SENOKOT) 8.6 MG tablet Take 1 tablet by mouth at bedtime.    sulfamethoxazole-trimethoprim (BACTRIM DS) 800-160 MG tablet Take 1 tablet by mouth 2 (two) times daily.   valsartan (DIOVAN) 320 MG tablet Take 1 tablet (320 mg total) by mouth daily.   No facility-administered encounter medications on file as of 07/14/2022.    Recent Office Vitals: BP Readings from Last 3 Encounters:  06/13/22 130/82  03/06/22 132/74  08/12/21 (!) 146/88   Pulse Readings from Last 3 Encounters:  06/13/22 67  03/06/22 67  08/12/21 64    Wt Readings from Last 3 Encounters:  06/13/22 157 lb (71.2 kg)  03/06/22 157 lb 9.6 oz (71.5 kg)  08/12/21 174 lb (78.9 kg)     Kidney Function Lab Results  Component Value Date/Time   CREATININE 2.36 (H) 06/22/2022 08:02 AM   CREATININE 2.49 (H) 06/07/2022 08:27 AM   GFRNONAA 35 (L) 12/22/2020 03:32 AM   GFRNONAA 22 (L) 10/08/2019 08:42 AM   GFRAA 26 (L) 10/08/2019 08:42 AM       Latest Ref Rng & Units 06/22/2022    8:02 AM 06/07/2022    8:27 AM 03/06/2022    2:23 PM  BMP  Glucose 65 - 99 mg/dL 97  97  80   BUN 7 - 25 mg/dL '27  18  25   '$ Creatinine 0.60 - 0.95 mg/dL 2.36  2.49  2.04   BUN/Creat Ratio 6 - 22 (calc) '11  7  12   '$ Sodium 135 - 146 mmol/L 146  145  141   Potassium 3.5 - 5.3 mmol/L 4.5  4.3  4.8   Chloride 98 - 110 mmol/L  111  109  108   CO2 20 - 32 mmol/L '25  24  25   '$ Calcium 8.6 - 10.4 mg/dL 8.9  8.6  9.0      Future Appointments  Date Time Provider [place]  08/18/2022  3:30 PM [facility]-NURSE HEALTH ADVISOR [facility]  09/28/2022  2:00 PM Rosana Hoes, Christian L, [place] None    [facility]

## 2022-07-18 ENCOUNTER — Telehealth: Payer: Self-pay | Admitting: Family Medicine

## 2022-07-18 NOTE — Telephone Encounter (Signed)
Received call from [facility] to follow up on referral they received for the patient to receive care; stated their agency is understaffed and unable to assist the patient at this time. They don't have any information about other agencies in the area able to accept the referral.   Please advise patient at [phone].

## 2022-08-15 ENCOUNTER — Telehealth: Payer: Self-pay

## 2022-08-15 NOTE — Telephone Encounter (Signed)
Per Tammy, Manager of [facility], [name] is being admitted there today and asks if pt is supposed to still be on Amlodipine and Senokot? It is not on the pharmacy profile and they want to make sure she still needs the medications? It looks like the last RF of the Amlodipine was 08/2021. Thanks.

## 2022-08-21 ENCOUNTER — Telehealth: Payer: Self-pay | Admitting: Pharmacist

## 2022-08-21 NOTE — Progress Notes (Signed)
Care Management & Coordination Services Pharmacy Team   Reason for Encounter: Hypertension   Contacted patient to discuss hypertension disease state. Unsuccessful outreach. Left voicemail for patient to return call.    Current antihypertensive regimen:  Carvedilol 25mg  BID Valsartan 320mg  Amlodipine 10mg   Patient verbally confirms she is taking the above medications as directed.   How often are you checking your Blood Pressure?   she checks her blood pressure   taking her medication.  Current home BP readings:   DATE:             BP               PULSE   Wrist or arm cuff: Caffeine intake: Salt intake: OTC medications including pseudoephedrine or NSAIDs?  Any readings above 180/100?  If yes any symptoms of hypertensive emergency?   What recent interventions/DTPs have been made by any provider to improve Blood Pressure control since last CPP Visit:  Patient was advised to take Amlodipine 10mg  more regularly for better control of blood pressure on 06/13/22.    Any recent hospitalizations or ED visits since last visit with CPP? No  What diet changes have been made to improve Blood Pressure Control?  Patient reported  What exercise is being done to improve your Blood Pressure Control?  Patient reported  Adherence Review: Is the patient currently on ACE/ARB medication? Yes Does the patient have >5 day gap between last estimated fill dates? No  Star Rating Drugs:  Medication:   Last Fill: Day Supply  Rosuvastatin 20 MG tablet     08/15/22           90 Valsartan 320 MG tablet         08/15/22     28  Chart Updates: Recent office visits:  06/13/22 Ruth Luo, MD - Family Medicine - Elevated creatine - Labs were ordered.   Recent consult visits:  None noted.  Hospital visits:  None in previous 6 months  Medications: Outpatient Encounter Medications as of 08/21/2022  Medication Sig   amLODipine (NORVASC) 10 MG tablet Take 1 tablet (10 mg total) by mouth daily.    carvedilol (COREG) 25 MG tablet TAKE (1) TABLET BY MOUTH TWICE DAILY.   cephALEXin (KEFLEX) 500 MG capsule Take 1 capsule (500 mg total) by mouth 3 (three) times daily.   clopidogrel (PLAVIX) 75 MG tablet TAKE ONE TABLET BY MOUTH ONCE DAILY.   dorzolamide-timolol (COSOPT) 22.3-6.8 MG/ML ophthalmic solution Place 1 drop into both eyes 2 (two) times daily.   Multiple Vitamins-Minerals (CENTRUM ULTRA WOMENS) TABS Take 1 tablet by mouth daily.     pantoprazole (PROTONIX) 40 MG tablet TAKE (1) TABLET BY MOUTH ONCE DAILY.   ROCKLATAN 0.02-0.005 % SOLN Apply 1 drop to eye daily.   rosuvastatin (CRESTOR) 20 MG tablet TAKE (1) TABLET BY MOUTH AT BEDTIME.   senna (SENOKOT) 8.6 MG tablet Take 1 tablet by mouth at bedtime.    sulfamethoxazole-trimethoprim (BACTRIM DS) 800-160 MG tablet Take 1 tablet by mouth 2 (two) times daily.   valsartan (DIOVAN) 320 MG tablet Take 1 tablet (320 mg total) by mouth daily.   No facility-administered encounter medications on file as of 08/21/2022.    Recent Office Vitals: BP Readings from Last 3 Encounters:  06/13/22 130/82  03/06/22 132/74  08/12/21 (!) 146/88   Pulse Readings from Last 3 Encounters:  06/13/22 67  03/06/22 67  08/12/21 64    Wt Readings from Last 3 Encounters:  06/13/22 157 lb (  71.2 kg)  03/06/22 157 lb 9.6 oz (71.5 kg)  08/12/21 174 lb (78.9 kg)     Kidney Function Lab Results  Component Value Date/Time   CREATININE 2.36 (H) 06/22/2022 08:02 AM   CREATININE 2.49 (H) 06/07/2022 08:27 AM   GFRNONAA 35 (L) 12/22/2020 03:32 AM   GFRNONAA 22 (L) 10/08/2019 08:42 AM   GFRAA 26 (L) 10/08/2019 08:42 AM       Latest Ref Rng & Units 06/22/2022    8:02 AM 06/07/2022    8:27 AM 03/06/2022    2:23 PM  BMP  Glucose 65 - 99 mg/dL 97  97  80   BUN 7 - 25 mg/dL 27  18  25    Creatinine 0.60 - 0.95 mg/dL 2.36  2.49  2.04   BUN/Creat Ratio 6 - 22 (calc) 11  7  12    Sodium 135 - 146 mmol/L 146  145  141   Potassium 3.5 - 5.3 mmol/L 4.5  4.3  4.8    Chloride 98 - 110 mmol/L 111  109  108   CO2 20 - 32 mmol/L 25  24  25    Calcium 8.6 - 10.4 mg/dL 8.9  8.6  9.0      Future Appointments  Date Time Provider [place]  09/28/2022  2:00 PM Edythe Clarity, [place] None    [facility]

## 2022-08-31 ENCOUNTER — Other Ambulatory Visit: Payer: Self-pay | Admitting: Family Medicine

## 2022-08-31 DIAGNOSIS — Z8673 Personal history of transient ischemic attack (TIA), and cerebral infarction without residual deficits: Secondary | ICD-10-CM

## 2022-08-31 DIAGNOSIS — I10 Essential (primary) hypertension: Secondary | ICD-10-CM

## 2022-08-31 DIAGNOSIS — Z8711 Personal history of peptic ulcer disease: Secondary | ICD-10-CM

## 2022-08-31 NOTE — Telephone Encounter (Signed)
Requested Prescriptions  Pending Prescriptions Disp Refills   carvedilol (COREG) 25 MG tablet [Pharmacy Med Name: CARVEDILOL 25 MG TABLET] 180 tablet 1    Sig: TAKE (1) TABLET BY MOUTH TWICE DAILY.     Cardiovascular: Beta Blockers 3 Failed - 08/31/2022 10:17 AM      Failed - Cr in normal range and within 360 days    Creat  Date Value Ref Range Status  06/22/2022 2.36 (H) 0.60 - 0.95 mg/dL Final   Creatinine, Urine  Date Value Ref Range Status  06/14/2022 122 20 - 275 mg/dL Final         Failed - Valid encounter within last 6 months    Recent Outpatient Visits           1 year ago Stage 3b chronic kidney disease ([place])   [place] Pickard, Cammie Mcgee, MD   1 year ago UTI symptoms   [place] Eulogio Bear, NP   1 year ago Urinary tract infection without hematuria, site unspecified   [place] Pickard, Cammie Mcgee, MD   2 years ago CKD (chronic kidney disease) stage 4, GFR 15-29 ml/min ([place])   [place], Crystal A, FNP              Passed - AST in normal range and within 360 days    AST  Date Value Ref Range Status  06/07/2022 12 10 - 35 U/L Final         Passed - ALT in normal range and within 360 days    ALT  Date Value Ref Range Status  06/07/2022 8 6 - 29 U/L Final         Passed - Last BP in normal range    BP Readings from Last 1 Encounters:  06/13/22 130/82         Passed - Last Heart Rate in normal range    Pulse Readings from Last 1 Encounters:  06/13/22 67          clopidogrel (PLAVIX) 75 MG tablet [Pharmacy Med Name: CLOPIDOGREL 75 MG TABLETS] 90 tablet 1    Sig: TAKE (1) TABLET BY MOUTH ONCE DAILY.     Hematology: Antiplatelets - clopidogrel Failed - 08/31/2022 10:17 AM      Failed - HCT in normal range and within 180 days    HCT  Date Value Ref Range Status  06/07/2022 33.8 (L) 35.0 - 45.0 % Final  05/13/2018 32 29 - 41 Final         Failed - HGB in normal  range and within 180 days    Hemoglobin  Date Value Ref Range Status  06/07/2022 10.6 (L) 11.7 - 15.5 g/dL Final         Failed - Cr in normal range and within 360 days    Creat  Date Value Ref Range Status  06/22/2022 2.36 (H) 0.60 - 0.95 mg/dL Final   Creatinine, Urine  Date Value Ref Range Status  06/14/2022 122 20 - 275 mg/dL Final         Failed - Valid encounter within last 6 months    Recent Outpatient Visits           1 year ago Stage 3b chronic kidney disease ([place])   [place] Pickard, Cammie Mcgee, MD   1 year ago UTI symptoms   [place] Eulogio Bear, NP   1  year ago Urinary tract infection without hematuria, site unspecified   [place] Pickard, Cammie Mcgee, MD   2 years ago CKD (chronic kidney disease) stage 4, GFR 15-29 ml/min ([place])   [place], Crystal A, FNP              Passed - PLT in normal range and within 180 days    Platelets  Date Value Ref Range Status  06/07/2022 267 140 - 400 [place]/uL Final          pantoprazole (PROTONIX) 40 MG tablet [Pharmacy Med Name: PANTOPRAZOLE SOD DR 40 MG TAB] 90 tablet 2    Sig: TAKE (1) TABLET BY MOUTH ONCE DAILY.     Gastroenterology: Proton Pump Inhibitors Failed - 08/31/2022 10:17 AM      Failed - Valid encounter within last 12 months    Recent Outpatient Visits           1 year ago Stage 3b chronic kidney disease ([place])   [place] Pickard, Cammie Mcgee, MD   1 year ago UTI symptoms   [place] Eulogio Bear, NP   1 year ago Urinary tract infection without hematuria, site unspecified   [place] Susy Frizzle, MD   2 years ago CKD (chronic kidney disease) stage 4, GFR 15-29 ml/min ([place])   [place], Crystal A, FNP               rosuvastatin (CRESTOR) 20 MG tablet [Pharmacy Med Name: ROSUVASTATIN CALCIUM 20 MG TAB] 90 tablet 2     Sig: TAKE (1) TABLET BY MOUTH ONCE DAILY.     Cardiovascular:  Antilipid - Statins 2 Failed - 08/31/2022 10:17 AM      Failed - Cr in normal range and within 360 days    Creat  Date Value Ref Range Status  06/22/2022 2.36 (H) 0.60 - 0.95 mg/dL Final   Creatinine, Urine  Date Value Ref Range Status  06/14/2022 122 20 - 275 mg/dL Final         Failed - Valid encounter within last 12 months    Recent Outpatient Visits           1 year ago Stage 3b chronic kidney disease ([place])   [place] Pickard, Cammie Mcgee, MD   1 year ago UTI symptoms   [place] Eulogio Bear, NP   1 year ago Urinary tract infection without hematuria, site unspecified   [place] Pickard, Cammie Mcgee, MD   2 years ago CKD (chronic kidney disease) stage 4, GFR 15-29 ml/min ([place])   [place], Crystal A, FNP              Failed - Lipid Panel in normal range within the last 12 months    Cholesterol  Date Value Ref Range Status  06/07/2022 152 <200 mg/dL Final   LDL Cholesterol (Calc)  Date Value Ref Range Status  06/07/2022 82 mg/dL (calc) Final    Comment:    Reference range: <100 . Desirable range <100 mg/dL for primary prevention;   <70 mg/dL for patients with CHD or diabetic patients  with > or = 2 CHD risk factors. Marland Kitchen LDL-C is now calculated using the Martin-Hopkins  calculation, which is a validated novel method providing  better accuracy than the Friedewald equation in the  estimation of LDL-C.  Cresenciano Genre  et al. JAMA. WG:[id number]): 2061-2068  (http://education.[employer].com/faq/FAQ164)    HDL  Date Value Ref Range Status  06/07/2022 48 (L) > OR = 50 mg/dL Final   Triglycerides  Date Value Ref Range Status  06/07/2022 127 <150 mg/dL Final         Passed - Patient is not pregnant       valsartan (DIOVAN) 320 MG tablet [Pharmacy Med Name: VALSARTAN 320MG  TABLET] 90 tablet 1    Sig: TAKE (1)  TABLET BY MOUTH ONCE DAILY.     Cardiovascular:  Angiotensin Receptor Blockers Failed - 08/31/2022 10:17 AM      Failed - Cr in normal range and within 180 days    Creat  Date Value Ref Range Status  06/22/2022 2.36 (H) 0.60 - 0.95 mg/dL Final   Creatinine, Urine  Date Value Ref Range Status  06/14/2022 122 20 - 275 mg/dL Final         Failed - Valid encounter within last 6 months    Recent Outpatient Visits           1 year ago Stage 3b chronic kidney disease ([place])   [place] Pickard, Cammie Mcgee, MD   1 year ago UTI symptoms   [place] Eulogio Bear, NP   1 year ago Urinary tract infection without hematuria, site unspecified   [place] Pickard, Cammie Mcgee, MD   2 years ago CKD (chronic kidney disease) stage 4, GFR 15-29 ml/min ([place])   [place], Crystal A, FNP              Passed - K in normal range and within 180 days    Potassium  Date Value Ref Range Status  06/22/2022 4.5 3.5 - 5.3 mmol/L Final         Passed - Patient is not pregnant      Passed - Last BP in normal range    BP Readings from Last 1 Encounters:  06/13/22 130/82

## 2022-09-19 ENCOUNTER — Telehealth: Payer: Self-pay | Admitting: Family Medicine

## 2022-09-19 NOTE — Telephone Encounter (Signed)
Called patient to schedule Medicare Annual Wellness Visit (AWV). Left message for patient to call back and schedule Medicare Annual Wellness Visit (AWV).  Last date of AWV: 08/12/2021    Please schedule an appointment at any time with Toni Amend, [facility]   If any questions, please contact me at [phone].  Thank you,  Judeth Cornfield,  AMB Clinical Support [facility] AWV Program Direct Dial ??[phone]

## 2022-09-27 ENCOUNTER — Telehealth: Payer: Self-pay | Admitting: Pharmacist

## 2022-09-27 NOTE — Progress Notes (Signed)
Care Management & Coordination Services Pharmacy Team  Reason for Encounter: Appointment Reminder  Contacted patient to confirm telephone appointment with Erskine Emery, PharmD on 09/28/2022 at 2 pm. Unsuccessful outreach. Unable to leave voicemail.   Star Rating Drugs:  Rosuvastatin 20 mg last filled 09/21/2022 30 DS Valsartan 320 mg last filled 09/05/2022 30 DS   Care Gaps: Annual wellness visit in last year? No  I Future Appointments  Date Time Provider Department Center  09/28/2022  2:00 PM Erroll Luna [facility] None   April D Calhoun, [facility] Clinical Pharmacist Assistant [phone]

## 2022-09-28 ENCOUNTER — Encounter: Payer: PPO | Admitting: Pharmacist

## 2022-09-28 NOTE — Progress Notes (Incomplete)
Care Management & Coordination Services Pharmacy Note  09/28/2022 Name:  [name] MRN:  [id number] DOB:  Mar 28, 1935  Summary: ***  Recommendations/Changes made from today's visit: ***  Follow up plan: ***   Subjective: [name] is an 88 y.o. year old female who is a primary patient of Pickard, Priscille Heidelberg, MD.  The care coordination team was consulted for assistance with disease management and care coordination needs.    {CCMTELEPHONEFACETOFACE:21091510} for {CCMINITIALFOLLOWUPCHOICE:21091511}.  Recent office visits: ***  Recent consult visits: ***  Hospital visits: {Hospital DC Yes/No:25215}   Objective:  Lab Results  Component Value Date   CREATININE 2.36 (H) 06/22/2022   BUN 27 (H) 06/22/2022   EGFR 18 (L) 06/07/2022   GFRNONAA 35 (L) 12/22/2020   GFRAA 26 (L) 10/08/2019   NA 146 06/22/2022   K 4.5 06/22/2022   CALCIUM 8.9 06/22/2022   CO2 25 06/22/2022   GLUCOSE 97 06/22/2022    Lab Results  Component Value Date/Time   HGBA1C  12/09/2008 05:55 AM    5.4 (NOTE) The ADA recommends the following therapeutic goal for glycemic control related to Hgb A1c measurement: Goal of therapy: <6.5 Hgb A1c  Reference: American Diabetes Association: Clinical Practice Recommendations 2010, Diabetes Care, 2010, 33: (Suppl  1).   HGBA1C  11/24/2008 06:07 AM    5.6 (NOTE) The ADA recommends the following therapeutic goal for glycemic control related to Hgb A1c measurement: Goal of therapy: <6.5 Hgb A1c  Reference: American Diabetes Association: Clinical Practice Recommendations 2010, Diabetes Care, 2010, 33: (Suppl  1).    Last diabetic Eye exam: No results found for: "HMDIABEYEEXA"  Last diabetic Foot exam: No results found for: "HMDIABFOOTEX"   Lab Results  Component Value Date   CHOL 152 06/07/2022   HDL 48 (L) 06/07/2022   LDLCALC 82 06/07/2022   TRIG 127 06/07/2022   CHOLHDL 3.2 06/07/2022       Latest Ref Rng & Units 06/07/2022    8:27 AM  03/06/2022    2:23 PM 06/23/2021    3:41 PM  Hepatic Function  Total Protein 6.1 - 8.1 g/dL 6.3  6.4  5.8   AST 10 - 35 U/L 12  12  14    ALT 6 - 29 U/L 8  9  6    Total Bilirubin 0.2 - 1.2 mg/dL 0.3  0.4  0.4     Lab Results  Component Value Date/Time   TSH 1.54 10/08/2019 08:42 AM   TSH 0.732 04/26/2018 09:21 AM       Latest Ref Rng & Units 06/07/2022    8:27 AM 03/06/2022    2:23 PM 06/23/2021    3:41 PM  CBC  WBC 3.8 - 10.8 Thousand/uL 5.4  7.5  6.8   Hemoglobin 11.7 - 15.5 g/dL 40.9  81.1  91.4   Hematocrit 35.0 - 45.0 % 33.8  39.2  37.3   Platelets 140 - 400 Thousand/uL 267  369  315     No results found for: "VD25OH", "VITAMINB12"  Clinical ASCVD: {YES/NO:21197} The ASCVD Risk score (Arnett DK, et al., 2019) failed to calculate for the following reasons:   The 2019 ASCVD risk score is only valid for ages 48 to 57   The patient has a prior MI or stroke diagnosis    ***Other: (CHADS2VASc if Afib, MMRC or CAT for COPD, ACT, DEXA)     06/13/2022    9:18 AM 08/12/2021    3:44 PM 12/02/2019    8:40 AM  Depression screen PHQ 2/9  Decreased Interest 0 0 0  Down, Depressed, Hopeless 0 0 0  PHQ - 2 Score 0 0 0     Social History   Tobacco Use  Smoking Status Former   Types: Cigarettes   Quit date: 10/02/1983   Years since quitting: 39.0  Smokeless Tobacco Never   BP Readings from Last 3 Encounters:  06/13/22 130/82  03/06/22 132/74  08/12/21 (!) 146/88   Pulse Readings from Last 3 Encounters:  06/13/22 67  03/06/22 67  08/12/21 64   Wt Readings from Last 3 Encounters:  06/13/22 157 lb (71.2 kg)  03/06/22 157 lb 9.6 oz (71.5 kg)  08/12/21 174 lb (78.9 kg)   BMI Readings from Last 3 Encounters:  06/13/22 28.72 kg/m  03/06/22 28.83 kg/m  08/12/21 31.83 kg/m    No Known Allergies  Medications Reviewed Today     Reviewed by Darral Dash, LPN (Licensed Practical Nurse) on 06/13/22 at [phone]  Med List Status: <None>   Medication Order Taking? Sig  Documenting Provider Last Dose Status Informant  amLODipine (NORVASC) 10 MG tablet [id number] Yes Take 1 tablet (10 mg total) by mouth daily. Donita Brooks, MD Taking Active   carvedilol (COREG) 25 MG tablet [id number] Yes TAKE (1) TABLET BY MOUTH TWICE DAILY. Donita Brooks, MD Taking Active   clopidogrel (PLAVIX) 75 MG tablet [id number] Yes TAKE ONE TABLET BY MOUTH ONCE DAILY. Donita Brooks, MD Taking Active   dorzolamide-timolol (COSOPT) 22.3-6.8 MG/ML ophthalmic solution [id number] Yes Place 1 drop into both eyes 2 (two) times daily. [provider] Taking Active Spouse/Significant Other  Multiple Vitamins-Minerals (CENTRUM ULTRA WOMENS) TABS [id number] Yes Take 1 tablet by mouth daily.   [provider] Taking Active Spouse/Significant Other  pantoprazole (PROTONIX) 40 MG tablet [id number] Yes TAKE (1) TABLET BY MOUTH ONCE DAILY. Donita Brooks, MD Taking Active   ROCKLATAN 0.02-0.005 % Criss Rosales [id number] Yes Apply 1 drop to eye daily. [provider] Taking Active Spouse/Significant Other  rosuvastatin (CRESTOR) 20 MG tablet [id number] Yes TAKE (1) TABLET BY MOUTH AT BEDTIME. Donita Brooks, MD Taking Active   senna (SENOKOT) 8.6 MG tablet [id number] Yes Take 1 tablet by mouth at bedtime.  [provider] Taking Active Spouse/Significant Other  sulfamethoxazole-trimethoprim (BACTRIM DS) 800-160 MG tablet [id number] Yes Take 1 tablet by mouth 2 (two) times daily. Donita Brooks, MD Taking Active   valsartan (DIOVAN) 320 MG tablet [id number] Yes Take 1 tablet (320 mg total) by mouth daily. Donita Brooks, MD Taking Active             SDOH:  (Social Determinants of Health) assessments and interventions performed: {yes/no:20286} SDOH Interventions    Flowsheet Row Clinical Support from 08/12/2021 in [facility]  SDOH Interventions   Food Insecurity Interventions Intervention Not Indicated  Housing Interventions Intervention Not  Indicated  Transportation Interventions Intervention Not Indicated  Financial Strain Interventions Intervention Not Indicated  Physical Activity Interventions Other (Comments)  [Encouraged pt to try chair exercises.]  Stress Interventions Intervention Not Indicated  Social Connections Interventions Intervention Not Indicated       Medication Assistance: {MEDASSISTANCEINFO:25044}  Medication Access: Within the past 30 days, how often has patient missed a dose of medication? *** Is a pillbox or other method used to improve adherence? {YES/NO:21197} Factors that may affect medication adherence? {CHL DESC; BARRIERS:21522} Are meds synced by current pharmacy? {YES/NO:21197} Are meds delivered by current pharmacy? {YES/NO:21197} Does patient experience  delays in picking up medications due to transportation concerns? {YES/NO:21197}  [facility] Reviewed: Is patient disadvantaged to use [facility]?: {YES/NO:21197} Current Rx insurance plan: *** Name and location of Current pharmacy:  [facility] - [place], [place] - [address] [place] [address] Phone: [phone] Fax: [phone]  [facility] [place], [place] - [address] [place] [address] Phone: [phone] Fax: [phone]  [facility] services reviewed with patient today?: {YES/NO:21197} Patient requests to transfer care to [facility]?: {YES/NO:21197} Reason patient declined to change pharmacies: {US patient preference:27474}  Compliance/Adherence/Medication fill history: Rosuvastatin 20 mg last filled 09/21/2022 30 DS Valsartan 320 mg last filled 09/05/2022 30 DS     Care Gaps: Annual wellness visit in last year? No   Assessment/Plan                   Hypertension (BP goal <130/80) -Controlled -Current treatment: Carvedilol  BID Appropriate, Effective, Safe, Accessible Valsartan  daily Appropriate,  Effective, Safe, Accessible Amlodipine  Appropriate, Effective, Safe, Accessible -Medications previously tried: lisinopril -Current home readings:  -Current dietary habits: normal at home, no logs today -Current exercise habits: minimal, husband reports she is in need of knee replacement and the pain limits her ability to exercise -Denies hypotensive/hypertensive symptoms -Educated on BP goals and benefits of medications for prevention of heart attack, stroke and kidney damage; Importance of home blood pressure monitoring; Symptoms of hypotension and importance of maintaining adequate hydration; -Counseled to monitor BP at home periodically, document, and provide log at future appointments -Recommended to continue current medication  Update 09/29/21 Home BP readings - 140s/80s per her husband They have a home visit set up tomorrow with [facility] where they will be coming out to check her blood pressure. Husband prefers her BP to run a little higher.  Has to stop lisinopril recently due to increased dry cough.  Cough has improved on new regimen. Will continue to follow BP and adjust meds as necessary with quarterly check ins.  Hyperlipidemia/: (LDL goal < 70) -Not ideally controlled -Current treatment: Rosuvastatin  daily Appropriate, Query effective, , -Medications previously tried: none noted  -Most recent LDL is 88 in May 2021.  Goal < 70 with Hx of Stroke. -Patient in stage 3b CKD - continue to monitor. -Recommended to continue current medication Could consider switch to Atorvastatin in the future if renal function continues to decline.  Update 09/29/21 No lipid panel on file since 2021.  GFR borderline for Crestor.  Continue to monitor. Reminded patient's husband thay she is due for fasting lab work and he reports he will check and make appointment if necessary.   Continue current medications for now - can adjust based on updated GFR and lipid panel. Pt goal LDL < 70 due  to previous stroke.  Could even shoot for < 55.  Hx of CVA (Goal: Reduce CV risk) -Controlled -Current treatment  Clopidogrel  daily -Medications previously tried: none noted -BP controlled, patient is on statin, would benefit from LDL < 70. -Denies any abnormal bleeding at this time  -Recommended to continue current medication Continue to monitor and address risk factors.        Willa Frater, PharmD, CPP Clinical Pharmacist Practitioner [facility] [phone]

## 2022-11-16 ENCOUNTER — Telehealth: Payer: Self-pay

## 2022-11-16 NOTE — Telephone Encounter (Signed)
Call from Blanket, Charity fundraiser at [employer]. Pt has been having some elevated BP readings for last few days. 150-200-80-90. Made pt an appointment for Monday. No other symptoms. Does she need another medication? Thanks.

## 2022-11-20 ENCOUNTER — Ambulatory Visit (INDEPENDENT_AMBULATORY_CARE_PROVIDER_SITE_OTHER): Payer: PPO | Admitting: Family Medicine

## 2022-11-20 ENCOUNTER — Encounter: Payer: Self-pay | Admitting: Family Medicine

## 2022-11-20 VITALS — BP 180/100 | HR 70 | Temp 97.7°F | Ht 62.0 in | Wt 147.6 lb

## 2022-11-20 DIAGNOSIS — N184 Chronic kidney disease, stage 4 (severe): Secondary | ICD-10-CM

## 2022-11-20 DIAGNOSIS — I129 Hypertensive chronic kidney disease with stage 1 through stage 4 chronic kidney disease, or unspecified chronic kidney disease: Secondary | ICD-10-CM

## 2022-11-20 MED ORDER — AMLODIPINE BESYLATE 10 MG PO TABS: 10.0000 mg | ORAL_TABLET | Freq: Every day | ORAL | 3 refills | Status: DC

## 2022-11-20 NOTE — Progress Notes (Signed)
Subjective:    Patient ID: [name], female    DOB: 1935-02-22, 88 y.o.   MRN: [id number]  Hypertension   Patient has a history of stage IIIb chronic kidney disease as well as aphasia secondary to his stroke.  She also has a history of hypertension.  Her last GFR in January was 18.  Shortly thereafter her husband died from COVID.  The patient is now in a skilled nursing facility.  She has not seen her nephrologist since going to the skilled nursing facility.  Therefore I do not believe that anyone else is checked her creatinine.  She is currently on carvedilol as well as valsartan.  She has not presently taking amlodipine.  I rechecked her blood pressure and found it to be 180/100.  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)   Past Surgical History:  Procedure Laterality Date   APPENDECTOMY     ESOPHAGEAL DILATION N/A 07/11/2018   Procedure: ESOPHAGEAL DILATION;  Surgeon: Malissa Hippo, MD;  [facility];  Service: Endoscopy;  Laterality: N/A;   ESOPHAGOGASTRODUODENOSCOPY N/A 04/28/2018   Procedure: ESOPHAGOGASTRODUODENOSCOPY (EGD);  Surgeon: Malissa Hippo, MD;  Location: [facility];  Service: Endoscopy;  Laterality: N/A;   ESOPHAGOGASTRODUODENOSCOPY N/A 07/11/2018   Procedure: ESOPHAGOGASTRODUODENOSCOPY (EGD);  Surgeon: Malissa Hippo, MD;  Location: [facility];  Service: Endoscopy;  Laterality: N/A;  1200   HARDWARE REMOVAL Right 10/05/2014   Procedure: HARDWARE  REMOVAL;  Surgeon: Jodi Geralds, MD;  Location: [facility] OR;  Service: Orthopedics;  Laterality: Right;   INTRAMEDULLARY (IM) NAIL INTERTROCHANTERIC Right 06/12/2014   Procedure: INTRAMEDULLARY (IM) NAIL INTERTROCHANTRIC RIGHT HIP;  Surgeon: Harvie Junior, MD;  Location: [facility] OR;  Service: Orthopedics;  Laterality: Right;   kidney stones     LITHOTRIPSY     PEG PLACEMENT     and removal   PEG TUBE REMOVAL     RADIOLOGY WITH ANESTHESIA  04/22/2012   Procedure: RADIOLOGY WITH ANESTHESIA;  Surgeon: Oneal Grout, MD;  Location: [facility] OR;  Service: Radiology;  Laterality: N/A;  VERTEBRAL ARTERY STENT PLACEMENT AND CEREBRAL ANGIOGRAM   TONSILLECTOMY     TYMPANOPLASTY     Current Outpatient Medications on File Prior to Visit  Medication Sig Dispense Refill   amLODipine (NORVASC) 10 MG tablet Take 1 tablet (10 mg total) by mouth daily. 90 tablet 3   carvedilol (COREG) 25 MG tablet TAKE (1) TABLET BY MOUTH TWICE DAILY. 180 tablet 1   cephALEXin (KEFLEX) 500 MG capsule Take 1 capsule (500 mg total) by mouth 3 (three) times daily. 21 capsule 0   clopidogrel (PLAVIX) 75 MG tablet TAKE (1) TABLET BY MOUTH ONCE DAILY. 90 tablet 1   dorzolamide-timolol (COSOPT) 22.3-6.8 MG/ML ophthalmic solution Place 1 drop into both eyes 2 (two) times daily.     Multiple Vitamins-Minerals (CENTRUM ULTRA WOMENS) TABS Take 1 tablet by  mouth daily.       pantoprazole (PROTONIX) 40 MG tablet TAKE (1) TABLET BY MOUTH ONCE DAILY. 90 tablet 2   ROCKLATAN 0.02-0.005 % SOLN Apply 1 drop to eye daily.     rosuvastatin (CRESTOR) 20 MG tablet TAKE (1) TABLET BY MOUTH ONCE DAILY. 90 tablet 2   senna (SENOKOT) 8.6 MG tablet Take 1 tablet by mouth at bedtime.      sulfamethoxazole-trimethoprim (BACTRIM DS) 800-160 MG tablet Take 1 tablet by mouth 2 (two) times daily. 14 tablet 0   valsartan (DIOVAN) 320 MG tablet TAKE (1) TABLET BY MOUTH ONCE DAILY. 90 tablet 1   No current facility-administered medications on file prior to visit.   No  Known Allergies  Social History   Socioeconomic History   Marital status: Married    Spouse name: Onalee Hua   Number of children: 0   Years of education: Not on file   Highest education level: Not on file  Occupational History    Comment: Retired Charity fundraiser  Tobacco Use   Smoking status: Former    Types: Cigarettes    Quit date: 10/02/1983    Years since quitting: 39.1   Smokeless tobacco: Never  Vaping Use   Vaping Use: Never used  Substance and Sexual Activity   Alcohol use: Not Currently   Drug use: No   Sexual activity: Not Currently  Other Topics Concern   Not on file  Social History Narrative   Lives with husband Onalee Hua.   2 step children.   Social Determinants of Health   Financial Resource Strain: Low Risk  (08/12/2021)   Overall Financial Resource Strain (CARDIA)    Difficulty of Paying Living Expenses: Not hard at all  Food Insecurity: No Food Insecurity (08/12/2021)   Hunger Vital Sign    Worried About Running Out of Food in the Last Year: Never true    Ran Out of Food in the Last Year: Never true  Transportation Needs: No Transportation Needs (08/12/2021)   PRAPARE - Administrator, Civil Service (Medical): No    Lack of Transportation (Non-Medical): No  Physical Activity: Inactive (08/12/2021)   Exercise Vital Sign    Days of Exercise per Week: 0 days    Minutes of Exercise per Session: 0 min  Stress: No Stress Concern Present (08/12/2021)   [employer] of Occupational Health - Occupational Stress Questionnaire    Feeling of Stress : Not at all  Social Connections: Moderately Integrated (08/12/2021)   Social Connection and Isolation Panel [NHANES]    Frequency of Communication with Friends and Family: More than three times a week    Frequency of Social Gatherings with Friends and Family: More than three times a week    Attends Religious Services: 1 to 4 times per year    Active Member of [employer] or Organizations: No    Attends Banker  Meetings: Never    Marital Status: Married  Catering manager Violence: Not At Risk (08/12/2021)   Humiliation, Afraid, Rape, and Kick questionnaire    Fear of Current or Ex-Partner: No    Emotionally Abused: No    Physically Abused: No    Sexually Abused: No     Review of Systems  All other systems reviewed and are negative.      Objective:   Physical Exam Vitals reviewed.  Constitutional:      General: She is not in acute distress.    Appearance: Normal appearance. She is normal weight. She  is not ill-appearing or toxic-appearing.  Cardiovascular:     Rate and Rhythm: Normal rate and regular rhythm.     Pulses: Normal pulses.     Heart sounds: Normal heart sounds. No murmur heard.    No friction rub. No gallop.  Pulmonary:     Effort: Pulmonary effort is normal. No respiratory distress.     Breath sounds: Normal breath sounds. No stridor. No wheezing, rhonchi or rales.  Abdominal:     General: Abdomen is flat. Bowel sounds are normal.     Palpations: Abdomen is soft.  Musculoskeletal:     Right lower leg: No edema.     Left lower leg: No edema.  Neurological:     Mental Status: She is alert.           Assessment & Plan:   Benign hypertension with CKD (chronic kidney disease) stage IV (HCC) - Plan: COMPLETE METABOLIC PANEL WITH GFR, CBC with Differential/Platelet, Urinalysis, Routine w reflex microscopic Start by getting her blood pressure down.  Add amlodipine 10 mg a day.  Check a CMP today to monitor her creatinine and renal function.  If dramatically worse, we will need to consult her nephrologist.  Check a urinalysis to rule out a urinary tract infection as a reason for her blood pressure to be elevated over the last week.  Recheck blood pressure in 1 week

## 2022-11-21 LAB — CBC WITH DIFFERENTIAL/PLATELET
Absolute Monocytes: 496 cells/uL (ref 200–950)
Basophils Absolute: 81 cells/uL (ref 0–200)
Basophils Relative: 1.3 %
Eosinophils Absolute: 279 cells/uL (ref 15–500)
Eosinophils Relative: 4.5 %
HCT: 37.4 % (ref 35.0–45.0)
Hemoglobin: 11.7 g/dL (ref 11.7–15.5)
Lymphs Abs: 1042 cells/uL (ref 850–3900)
MCH: 26.8 pg — ABNORMAL LOW (ref 27.0–33.0)
MCHC: 31.3 g/dL — ABNORMAL LOW (ref 32.0–36.0)
MCV: 85.6 fL (ref 80.0–100.0)
MPV: 10.6 fL (ref 7.5–12.5)
Monocytes Relative: 8 %
Neutro Abs: 4303 cells/uL (ref 1500–7800)
Neutrophils Relative %: 69.4 %
Platelets: 261 10*3/uL (ref 140–400)
RBC: 4.37 10*6/uL (ref 3.80–5.10)
RDW: 12 % (ref 11.0–15.0)
Total Lymphocyte: 16.8 %
WBC: 6.2 10*3/uL (ref 3.8–10.8)

## 2022-11-21 LAB — URINALYSIS, ROUTINE W REFLEX MICROSCOPIC
Bilirubin Urine: NEGATIVE
Glucose, UA: NEGATIVE
Hyaline Cast: NONE SEEN /LPF
Ketones, ur: NEGATIVE
Nitrite: NEGATIVE
Specific Gravity, Urine: 1.013 (ref 1.001–1.035)
WBC, UA: >=60 /HPF — AB (ref 0–5)
pH: 6.5 (ref 5.0–8.0)

## 2022-11-21 LAB — COMPLETE METABOLIC PANEL WITH GFR
AG Ratio: 1.6 (calc) (ref 1.0–2.5)
ALT: 10 U/L (ref 6–29)
AST: 15 U/L (ref 10–35)
Albumin: 3.9 g/dL (ref 3.6–5.1)
Alkaline phosphatase (APISO): 91 U/L (ref 37–153)
BUN/Creatinine Ratio: 13 (calc) (ref 6–22)
BUN: 34 mg/dL — ABNORMAL HIGH (ref 7–25)
CO2: 26 mmol/L (ref 20–32)
Calcium: 9 mg/dL (ref 8.6–10.4)
Chloride: 107 mmol/L (ref 98–110)
Creat: 2.6 mg/dL — ABNORMAL HIGH (ref 0.60–0.95)
Globulin: 2.5 g/dL (calc) (ref 1.9–3.7)
Glucose, Bld: 95 mg/dL (ref 65–99)
Potassium: 5 mmol/L (ref 3.5–5.3)
Sodium: 140 mmol/L (ref 135–146)
Total Bilirubin: 0.5 mg/dL (ref 0.2–1.2)
Total Protein: 6.4 g/dL (ref 6.1–8.1)
eGFR: 18 mL/min/{1.73_m2} — ABNORMAL LOW (ref >=60)

## 2022-11-21 LAB — MICROSCOPIC MESSAGE

## 2022-11-23 ENCOUNTER — Other Ambulatory Visit: Payer: Self-pay

## 2022-11-23 DIAGNOSIS — N390 Urinary tract infection, site not specified: Secondary | ICD-10-CM

## 2022-11-23 MED ORDER — CEPHALEXIN 500 MG PO CAPS
500.0000 mg | ORAL_CAPSULE | Freq: Three times a day (TID) | ORAL | 0 refills | Status: DC
Start: 2022-11-23 — End: 2023-01-04

## 2022-11-28 ENCOUNTER — Other Ambulatory Visit: Payer: Self-pay

## 2022-11-28 ENCOUNTER — Emergency Department (HOSPITAL_COMMUNITY): Payer: PPO

## 2022-11-28 ENCOUNTER — Emergency Department (HOSPITAL_COMMUNITY)
Admission: EM | Admit: 2022-11-28 | Discharge: 2022-11-28 | Disposition: A | Discharge status: Home / Self Care | Payer: PPO | Attending: Emergency Medicine | Admitting: Emergency Medicine

## 2022-11-28 ENCOUNTER — Encounter (HOSPITAL_COMMUNITY): Payer: Self-pay | Admitting: *Deleted

## 2022-11-28 DIAGNOSIS — M1711 Unilateral primary osteoarthritis, right knee: Secondary | ICD-10-CM | POA: Diagnosis not present

## 2022-11-28 DIAGNOSIS — Z8673 Personal history of transient ischemic attack (TIA), and cerebral infarction without residual deficits: Secondary | ICD-10-CM | POA: Insufficient documentation

## 2022-11-28 DIAGNOSIS — Z7902 Long term (current) use of antithrombotics/antiplatelets: Secondary | ICD-10-CM | POA: Diagnosis not present

## 2022-11-28 DIAGNOSIS — M549 Dorsalgia, unspecified: Secondary | ICD-10-CM | POA: Diagnosis not present

## 2022-11-28 DIAGNOSIS — N3001 Acute cystitis with hematuria: Secondary | ICD-10-CM | POA: Diagnosis not present

## 2022-11-28 DIAGNOSIS — M25561 Pain in right knee: Secondary | ICD-10-CM | POA: Insufficient documentation

## 2022-11-28 DIAGNOSIS — Z043 Encounter for examination and observation following other accident: Secondary | ICD-10-CM | POA: Diagnosis not present

## 2022-11-28 DIAGNOSIS — I10 Essential (primary) hypertension: Secondary | ICD-10-CM | POA: Diagnosis not present

## 2022-11-28 DIAGNOSIS — N1832 Chronic kidney disease, stage 3b: Secondary | ICD-10-CM

## 2022-11-28 DIAGNOSIS — R109 Unspecified abdominal pain: Secondary | ICD-10-CM | POA: Diagnosis present

## 2022-11-28 DIAGNOSIS — M47816 Spondylosis without myelopathy or radiculopathy, lumbar region: Secondary | ICD-10-CM | POA: Diagnosis not present

## 2022-11-28 DIAGNOSIS — Z96698 Presence of other orthopedic joint implants: Secondary | ICD-10-CM | POA: Diagnosis not present

## 2022-11-28 DIAGNOSIS — M8580 Other specified disorders of bone density and structure, unspecified site: Secondary | ICD-10-CM | POA: Diagnosis not present

## 2022-11-28 DIAGNOSIS — R1084 Generalized abdominal pain: Secondary | ICD-10-CM | POA: Diagnosis not present

## 2022-11-28 LAB — URINALYSIS, ROUTINE W REFLEX MICROSCOPIC
Bacteria, UA: NONE SEEN
Bilirubin Urine: NEGATIVE
Glucose, UA: NEGATIVE mg/dL
Ketones, ur: NEGATIVE mg/dL
Nitrite: NEGATIVE
Protein, ur: 30 mg/dL — AB
Specific Gravity, Urine: 1.012 (ref 1.005–1.030)
pH: 6 (ref 5.0–8.0)

## 2022-11-28 LAB — CBC
HCT: 38.3 % (ref 36.0–46.0)
Hemoglobin: 11.7 g/dL — ABNORMAL LOW (ref 12.0–15.0)
MCH: 26.6 pg (ref 26.0–34.0)
MCHC: 30.5 g/dL (ref 30.0–36.0)
MCV: 87 fL (ref 80.0–100.0)
Platelets: 243 10*3/uL (ref 150–400)
RBC: 4.4 MIL/uL (ref 3.87–5.11)
RDW: 12.9 % (ref 11.5–15.5)
WBC: 7.2 10*3/uL (ref 4.0–10.5)
nRBC: 0 % (ref 0.0–0.2)

## 2022-11-28 LAB — BASIC METABOLIC PANEL
Anion gap: 9 (ref 5–15)
BUN: 38 mg/dL — ABNORMAL HIGH (ref 8–23)
CO2: 23 mmol/L (ref 22–32)
Calcium: 8.5 mg/dL — ABNORMAL LOW (ref 8.9–10.3)
Chloride: 109 mmol/L (ref 98–111)
Creatinine, Ser: 2.39 mg/dL — ABNORMAL HIGH (ref 0.44–1.00)
GFR, Estimated: 19 mL/min — ABNORMAL LOW (ref >60)
Glucose, Bld: 126 mg/dL — ABNORMAL HIGH (ref 70–99)
Potassium: 4.1 mmol/L (ref 3.5–5.1)
Sodium: 141 mmol/L (ref 135–145)

## 2022-11-28 MED ORDER — CIPROFLOXACIN HCL 500 MG PO TABS: 500.0000 mg | ORAL_TABLET | Freq: Two times a day (BID) | ORAL | 0 refills | Status: AC

## 2022-11-28 NOTE — ED Provider Notes (Signed)
[place] EMERGENCY DEPARTMENT AT [facility] Provider Note   CSN: [id number] Arrival date & time: 11/28/22  1145     History  Chief Complaint  Patient presents with   Abdominal Pain    [name] is a 88 y.o. female.   Abdominal Pain  This patient is an 88 year old female, presents with normal vital signs but a complaint of right leg pain, lower back pain, states that both of her knees hurt.  When I ask her about her abdomen she states that she absolutely has no abdominal tenderness to palpation or abdominal pain at all.  Evidently she was recently on an antibiotic for a urinary tract infection.  I do not have any results in although there is something in the medical record from her doctor's office about a UTI and orders from June 20.  She had evidently been taking cephalexin at some point over the last week  The patient denies any falls to me, she does have a history of a stroke which has made it difficult for her to speak   Home Medications Prior to Admission medications   Medication Sig Start Date End Date Taking? Authorizing Provider  ciprofloxacin (CIPRO) 500 MG tablet Take 1 tablet (500 mg total) by mouth 2 (two) times daily for 5 days. 11/28/22 12/03/22 Yes Eber Hong, MD  amLODipine (NORVASC) 10 MG tablet Take 1 tablet (10 mg total) by mouth daily. 11/20/22   Donita Brooks, MD  carvedilol (COREG) 25 MG tablet TAKE (1) TABLET BY MOUTH TWICE DAILY. 08/31/22   Donita Brooks, MD  cephALEXin (KEFLEX) 500 MG capsule Take 1 capsule (500 mg total) by mouth 3 (three) times daily. 11/23/22   Donita Brooks, MD  clopidogrel (PLAVIX) 75 MG tablet TAKE (1) TABLET BY MOUTH ONCE DAILY. 08/31/22   Donita Brooks, MD  dorzolamide-timolol (COSOPT) 22.3-6.8 MG/ML ophthalmic solution Place 1 drop into both eyes 2 (two) times daily. 11/25/20   [provider]  Multiple Vitamins-Minerals (CENTRUM ULTRA WOMENS) TABS Take 1 tablet by mouth daily.      [provider]  pantoprazole (PROTONIX) 40 MG tablet TAKE (1) TABLET BY MOUTH ONCE DAILY. 08/31/22   Donita Brooks, MD  ROCKLATAN 0.02-0.005 % SOLN Apply 1 drop to eye daily. 11/25/20   [provider]  rosuvastatin (CRESTOR) 20 MG tablet TAKE (1) TABLET BY MOUTH ONCE DAILY. 08/31/22   Donita Brooks, MD  senna (SENOKOT) 8.6 MG tablet Take 1 tablet by mouth at bedtime.     [provider]  valsartan (DIOVAN) 320 MG tablet TAKE (1) TABLET BY MOUTH ONCE DAILY. 08/31/22   Donita Brooks, MD      Allergies    Patient has no known allergies.    Review of Systems   Review of Systems  Gastrointestinal:  Positive for abdominal pain.  All other systems reviewed and are negative.   Physical Exam Updated Vital Signs BP (!) 164/53 (BP Location: Left Arm)   Pulse 68   Temp 97.7 F (36.5 C) (Oral)   Resp 16   Ht 1.575 m (5\' 2" )   Wt 66.9 kg   SpO2 98%   BMI 26.98 kg/m  Physical Exam Vitals and nursing note reviewed.  Constitutional:      General: She is not in acute distress.    Appearance: She is well-developed.  HENT:     Head: Normocephalic and atraumatic.     Mouth/Throat:     Pharynx:  No oropharyngeal exudate.  Eyes:     General: No scleral icterus.       Right eye: No discharge.        Left eye: No discharge.     Conjunctiva/sclera: Conjunctivae normal.     Pupils: Pupils are equal, round, and reactive to light.  Neck:     Thyroid: No thyromegaly.     Vascular: No JVD.  Cardiovascular:     Rate and Rhythm: Normal rate and regular rhythm.     Heart sounds: Normal heart sounds. No murmur heard.    No friction rub. No gallop.  Pulmonary:     Effort: Pulmonary effort is normal. No respiratory distress.     Breath sounds: Normal breath sounds. No wheezing or rales.  Abdominal:     General: Bowel sounds are normal. There is no distension.     Palpations: Abdomen is soft. There is no mass.     Tenderness: There is no abdominal tenderness.      Comments: Soft and completely nontender abdomen  Musculoskeletal:        General: Tenderness present. No swelling.     Cervical back: Normal range of motion and neck supple.     Right lower leg: No edema.     Left lower leg: No edema.     Comments: Scattered small bruises across the lower extremities.  Many of these are yellowish.  She has crepitance with range of motion of the right knee, left knee appears pretty unremarkable.  She can straight leg raise on the left but not the right.  Range of motion of the right hip with some pain, tenderness with palpation over the right femoral compartment.  Lymphadenopathy:     Cervical: No cervical adenopathy.  Skin:    General: Skin is warm and dry.     Findings: No erythema or rash.  Neurological:     Mental Status: She is alert.     Coordination: Coordination normal.  Psychiatric:        Behavior: Behavior normal.     ED Results / Procedures / Treatments   Labs (all labs ordered are listed, but only abnormal results are displayed) Labs Reviewed  CBC - Abnormal; Notable for the following components:      Result Value   Hemoglobin 11.7 (*)    All other components within normal limits  BASIC METABOLIC PANEL - Abnormal; Notable for the following components:   Glucose, Bld 126 (*)    BUN 38 (*)    Creatinine, Ser 2.39 (*)    Calcium 8.5 (*)    GFR, Estimated 19 (*)    All other components within normal limits  URINALYSIS, ROUTINE W REFLEX MICROSCOPIC - Abnormal; Notable for the following components:   APPearance HAZY (*)    Hgb urine dipstick SMALL (*)    Protein, ur 30 (*)    Leukocytes,Ua LARGE (*)    All other components within normal limits  URINE CULTURE    EKG None  Radiology DG Knee Complete 4 Views Right  Result Date: 11/28/2022 CLINICAL DATA:  Fall on right side. EXAM: RIGHT KNEE - COMPLETE 4+ VIEW; RIGHT FEMUR 2 VIEWS COMPARISON:  Multiple prior radiographs including June 12, 2014 and Oct 05, 2014. FINDINGS: Right  femur: No evidence of acute fracture or dislocation. Intramedullary rod in the right femur for prior intertrochanteric fracture. Right knee: No evidence of fracture, dislocation, or joint effusion. Tricompartmental knee joint space narrowing severe in the medial tibiofemoral and  patellofemoral compartments with marginal osteophytes. IMPRESSION: 1. No evidence of acute fracture or dislocation. 2. Intramedullary rod for prior intertrochanteric fracture with intact hardware. 3. Tricompartmental knee osteoarthritis, severe in the medial tibiofemoral and patellofemoral compartments. Electronically Signed   By: Larose Hires D.O.   On: 11/28/2022 13:51   DG FEMUR, MIN 2 VIEWS RIGHT  Result Date: 11/28/2022 CLINICAL DATA:  Fall on right side. EXAM: RIGHT KNEE - COMPLETE 4+ VIEW; RIGHT FEMUR 2 VIEWS COMPARISON:  Multiple prior radiographs including June 12, 2014 and Oct 05, 2014. FINDINGS: Right femur: No evidence of acute fracture or dislocation. Intramedullary rod in the right femur for prior intertrochanteric fracture. Right knee: No evidence of fracture, dislocation, or joint effusion. Tricompartmental knee joint space narrowing severe in the medial tibiofemoral and patellofemoral compartments with marginal osteophytes. IMPRESSION: 1. No evidence of acute fracture or dislocation. 2. Intramedullary rod for prior intertrochanteric fracture with intact hardware. 3. Tricompartmental knee osteoarthritis, severe in the medial tibiofemoral and patellofemoral compartments. Electronically Signed   By: Larose Hires D.O.   On: 11/28/2022 13:51   DG Pelvis 1-2 Views  Result Date: 11/28/2022 CLINICAL DATA:  Fall. EXAM: PELVIS - 1-2 VIEW COMPARISON:  None Available. FINDINGS: Prior right intramedullary nail and screw fixation of the right hip. No evidence of acute fracture or joint malalignment. Osteopenia. Lower lumbar degenerative change. IMPRESSION: 1. Prior right intramedullary nail and screw fixation of the right hip. No  evidence of acute fracture or joint malalignment. 2. Osteopenia. Electronically Signed   By: Feliberto Harts M.D.   On: 11/28/2022 13:44    Procedures Procedures    Medications Ordered in ED Medications - No data to display  ED Course/ Medical Decision Making/ A&P                             Medical Decision Making Amount and/or Complexity of Data Reviewed Labs: ordered. Radiology: ordered.  Risk Prescription drug management.   The patient is from [facility], vitals unremarkable, exam could be consistent with trauma or injury or fracture or severe arthritis, she is able to follow commands but has some obvious weakness limited by pain of the right lower extremity.  She has normal pulses at the bilateral feet and good capillary refill suggesting this is not a vascular phenomenon.  Neurologically she has normal sensation, she is able to straight leg raise bilaterally but with pain on the right side.  Compartments are soft, lumbar area is nontender, abdomen is completely nontender.  Will proceed with imaging of the right leg including hip femur and knee and labs including urinalysis  No leukocytosis or significant anemia, metabolic panel with baseline creatinine which is better than usual, tested a week ago it was 2.6, today it is 2.4.  No electrolyte abnormalities Urinalysis with large leukocyte Estrace with 21-50 white blood cells and no bacteria were seen a culture will be ordered  X-rays of the patient's knee show that she has severe tricompartmental arthritis, no fractures  I have discussed with the patient at the bedside the results, and the meaning of these results.  They have expressed her understanding to the need for follow-up with primary care physician  Home with Cipro Patient stable and agreeable to the plan        Final Clinical Impression(s) / ED Diagnoses Final diagnoses:  Acute cystitis with hematuria  Arthritis of right knee    Rx / DC  Orders ED  Discharge Orders          Ordered    ciprofloxacin (CIPRO) 500 MG tablet  2 times daily        11/28/22 1457              Eber Hong, MD 11/28/22 1459

## 2022-11-28 NOTE — ED Triage Notes (Signed)
Pt BIB [facility] for c/o abdominal pain and lower back pain; pt was diagnosed with UTI last week and has been taking keflex; pt is on day 5 of 7 of the antibiotics  Pt also c/o bilateral knee pain  Pt told ems that she has not been eating due to abdominal pain

## 2022-11-28 NOTE — ED Notes (Signed)
Called facility and they stated they are waiting for transport to arrive so they can come and pick up pt

## 2022-11-28 NOTE — Discharge Instructions (Signed)
Your testing shows that you have severe arthritis of your knee but it also shows that you have a urinary tract infection of your bladder.  Please take the medication prescribed and follow-up with your doctor within 3 days.  Come back to the ER for severe or worsening pain vomiting fever or any other worsening symptoms.

## 2022-11-30 LAB — URINE CULTURE: Culture: <10000 — AB

## 2022-12-01 ENCOUNTER — Telehealth: Payer: Self-pay

## 2022-12-01 NOTE — Telephone Encounter (Signed)
Call from Marfa, RN with [facility], asking for something to help the patient with her knee pain, like Voltaren or Tylenol Arthritis.  If Rx can be sent in before 6 pm today, please send to [facility]. If after 6 pm, please send to [facility]. Thank you.

## 2022-12-04 ENCOUNTER — Other Ambulatory Visit: Payer: Self-pay | Admitting: Family Medicine

## 2022-12-04 ENCOUNTER — Ambulatory Visit (HOSPITAL_COMMUNITY)
Admission: RE | Admit: 2022-12-04 | Discharge: 2022-12-04 | Disposition: A | Discharge status: Home / Self Care | Payer: PPO | Source: Ambulatory Visit | Attending: Family Medicine | Admitting: Family Medicine

## 2022-12-04 ENCOUNTER — Ambulatory Visit (INDEPENDENT_AMBULATORY_CARE_PROVIDER_SITE_OTHER): Payer: PPO | Admitting: Family Medicine

## 2022-12-04 VITALS — BP 106/54 | HR 65 | Temp 97.8°F

## 2022-12-04 DIAGNOSIS — M549 Dorsalgia, unspecified: Secondary | ICD-10-CM | POA: Diagnosis not present

## 2022-12-04 DIAGNOSIS — M5136 Other intervertebral disc degeneration, lumbar region: Secondary | ICD-10-CM | POA: Diagnosis not present

## 2022-12-04 DIAGNOSIS — M47814 Spondylosis without myelopathy or radiculopathy, thoracic region: Secondary | ICD-10-CM | POA: Diagnosis not present

## 2022-12-04 DIAGNOSIS — M8580 Other specified disorders of bone density and structure, unspecified site: Secondary | ICD-10-CM | POA: Diagnosis not present

## 2022-12-04 DIAGNOSIS — M545 Low back pain, unspecified: Secondary | ICD-10-CM | POA: Diagnosis not present

## 2022-12-04 DIAGNOSIS — M546 Pain in thoracic spine: Secondary | ICD-10-CM | POA: Diagnosis not present

## 2022-12-04 MED ORDER — ACETAMINOPHEN 500 MG PO TABS: 500.0000 mg | ORAL_TABLET | Freq: Four times a day (QID) | ORAL | 5 refills | Status: DC | PRN

## 2022-12-04 NOTE — Telephone Encounter (Addendum)
Call and spoke w/Sylvia, RN with [facility], give pcp's msg ," Cannot take voltaren due to kidney function.  Can take tylenol arthritis. I will send rx." Which nurse is aware and voiced understanding.      However, nurse was referring to the Voltaren Gel? Can pt use the gel? Pls adivice.  Thank you.

## 2022-12-04 NOTE — Progress Notes (Signed)
Subjective:    Patient ID: [name], female    DOB: 26-Dec-1934, 88 y.o.   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  She also has a history of hypertension.  Her last GFR in January was 18.  Shortly thereafter her husband died from COVID.  The patient is now in a skilled nursing facility.  She has not seen her nephrologist since going to the skilled nursing facility.  Therefore I do not believe that anyone else is checked her creatinine.  She is currently on carvedilol as well as valsartan.  She has not presently taking amlodipine.  I rechecked her blood pressure and found it to be 180/100.  She has a history of frequent urinary tract infections but she denies any dysuria today.  At that time, my plan was: Start by getting her blood pressure down.  Add amlodipine 10 mg a day.  Check a CMP today to monitor her creatinine and renal function.  If dramatically worse, we will need to consult her nephrologist.  Check a urinalysis to rule out a urinary tract infection as a reason for her blood pressure to be elevated over the last week.  Recheck blood pressure in 1 week  12/04/22 Patient's creatinine at that visit was up to 2.6.  Urinalysis showed significant pyuria.  I started the patient on Keflex.  Due to severe pain, the patient went to the emergency room on June 25.  In the emergency room they obtained an x-ray of the pelvis the right femur and the right knee.  X-ray of the knee showed severe tricompartment-itis.  X-ray of the pelvis showed no fracture but did show degenerative disc disease in the lower back.  She again had pyuria urine.  She is here today for follow-up.  When I last saw the patient her blood pressure was extremely high.  Today is relatively low.  They have not been checking her blood pressure at the nursing home.  Urine culture from the emergency room showed contamination but no  true urinary tract infection.  The patient continues to report severe pain however she is unable to localize the location.  When I palpate her right knee she denies any pain there.  She also denies any pain when I palpate the spinous processes of her lower back.  However around T7 and T8 she winces in pain and cries when I palpate that area.  Gentle percussion along the spine causes significant pain in that area.  She is not currently taking any pain medication. 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)   Past Surgical History:  Procedure Laterality Date   APPENDECTOMY     ESOPHAGEAL DILATION N/A 07/11/2018   Procedure: ESOPHAGEAL DILATION;  Surgeon: Malissa Hippo, MD;  Location: [facility];  Service: Endoscopy;  Laterality: N/A;   ESOPHAGOGASTRODUODENOSCOPY N/A 04/28/2018   Procedure: ESOPHAGOGASTRODUODENOSCOPY (EGD);  Surgeon:  Malissa Hippo, MD;  Location: [facility];  Service: Endoscopy;  Laterality: N/A;   ESOPHAGOGASTRODUODENOSCOPY N/A 07/11/2018   Procedure: ESOPHAGOGASTRODUODENOSCOPY (EGD);  Surgeon: Malissa Hippo, MD;  Location: [facility];  Service: Endoscopy;  Laterality: N/A;  1200   HARDWARE REMOVAL Right 10/05/2014   Procedure: HARDWARE REMOVAL;  Surgeon: Jodi Geralds, MD;  Location: [facility] OR;  Service: Orthopedics;  Laterality: Right;   INTRAMEDULLARY (IM) NAIL INTERTROCHANTERIC Right 06/12/2014   Procedure: INTRAMEDULLARY (IM) NAIL INTERTROCHANTRIC RIGHT HIP;  Surgeon: Harvie Junior, MD;  Location: [facility] OR;  Service: Orthopedics;  Laterality: Right;   kidney stones      LITHOTRIPSY     PEG PLACEMENT     and removal   PEG TUBE REMOVAL     RADIOLOGY WITH ANESTHESIA  04/22/2012   Procedure: RADIOLOGY WITH ANESTHESIA;  Surgeon: Oneal Grout, MD;  Location: [facility] OR;  Service: Radiology;  Laterality: N/A;  VERTEBRAL ARTERY STENT PLACEMENT AND CEREBRAL ANGIOGRAM   TONSILLECTOMY     TYMPANOPLASTY     Current Outpatient Medications on File Prior to Visit  Medication Sig Dispense Refill   amLODipine (NORVASC) 10 MG tablet Take 1 tablet (10 mg total) by mouth daily. 90 tablet 3   carvedilol (COREG) 25 MG tablet TAKE (1) TABLET BY MOUTH TWICE DAILY. 180 tablet 1   clopidogrel (PLAVIX) 75 MG tablet TAKE (1) TABLET BY MOUTH ONCE DAILY. 90 tablet 1   dorzolamide-timolol (COSOPT) 22.3-6.8 MG/ML ophthalmic solution Place 1 drop into both eyes 2 (two) times daily.     pantoprazole (PROTONIX) 40 MG tablet TAKE (1) TABLET BY MOUTH ONCE DAILY. 90 tablet 2   ROCKLATAN 0.02-0.005 % SOLN Apply 1 drop to eye daily.     rosuvastatin (CRESTOR) 20 MG tablet TAKE (1) TABLET BY MOUTH ONCE DAILY. 90 tablet 2   senna (SENOKOT) 8.6 MG tablet Take 1 tablet by mouth at bedtime.      valsartan (DIOVAN) 320 MG tablet TAKE (1) TABLET BY MOUTH ONCE DAILY. 90 tablet 1   cephALEXin (KEFLEX) 500 MG capsule Take 1 capsule (500 mg total) by mouth 3 (three) times daily. 21 capsule 0   Multiple Vitamins-Minerals (CENTRUM ULTRA WOMENS) TABS Take 1 tablet by mouth daily.   (Patient not taking: Reported on 12/04/2022)     No current facility-administered medications on file prior to visit.   No Known Allergies  Social History   Socioeconomic History   Marital status: Married    Spouse name: Onalee Hua   Number of children: 0   Years of education: Not on file   Highest education level: Not on file  Occupational History    Comment: Retired Charity fundraiser  Tobacco Use   Smoking status: Former    Types: Cigarettes    Quit date: 10/02/1983    Years since quitting: 39.2   Smokeless tobacco: Never  Vaping  Use   Vaping Use: Never used  Substance and Sexual Activity   Alcohol use: Not Currently   Drug use: No   Sexual activity: Not Currently  Other Topics Concern   Not on file  Social History Narrative   Lives with husband Onalee Hua.   2 step children.   Social Determinants of Health   Financial Resource Strain: Low Risk  (08/12/2021)   Overall Financial Resource Strain (CARDIA)    Difficulty of Paying Living Expenses: Not hard at all  Food Insecurity: No Food Insecurity (08/12/2021)   Hunger Vital Sign    Worried About Running Out  of Food in the Last Year: Never true    Ran Out of Food in the Last Year: Never true  Transportation Needs: No Transportation Needs (08/12/2021)   PRAPARE - Administrator, Civil Service (Medical): No    Lack of Transportation (Non-Medical): No  Physical Activity: Inactive (08/12/2021)   Exercise Vital Sign    Days of Exercise per Week: 0 days    Minutes of Exercise per Session: 0 min  Stress: No Stress Concern Present (08/12/2021)   [employer] of Occupational Health - Occupational Stress Questionnaire    Feeling of Stress : Not at all  Social Connections: Moderately Integrated (08/12/2021)   Social Connection and Isolation Panel [NHANES]    Frequency of Communication with Friends and Family: More than three times a week    Frequency of Social Gatherings with Friends and Family: More than three times a week    Attends Religious Services: 1 to 4 times per year    Active Member of [employer] or Organizations: No    Attends Banker Meetings: Never    Marital Status: Married  Catering manager Violence: Not At Risk (08/12/2021)   Humiliation, Afraid, Rape, and Kick questionnaire    Fear of Current or Ex-Partner: No    Emotionally Abused: No    Physically Abused: No    Sexually Abused: No     Review of Systems  All other systems reviewed and are negative.      Objective:   Physical Exam Vitals reviewed.  Constitutional:       General: She is not in acute distress.    Appearance: Normal appearance. She is normal weight. She is not ill-appearing or toxic-appearing.  Cardiovascular:     Rate and Rhythm: Normal rate and regular rhythm.     Pulses: Normal pulses.     Heart sounds: Normal heart sounds. No murmur heard.    No friction rub. No gallop.  Pulmonary:     Effort: Pulmonary effort is normal. No respiratory distress.     Breath sounds: Normal breath sounds. No stridor. No wheezing, rhonchi or rales.  Abdominal:     General: Abdomen is flat. Bowel sounds are normal.     Palpations: Abdomen is soft.  Musculoskeletal:     Thoracic back: Tenderness and bony tenderness present. Decreased range of motion.       Back:     Right lower leg: No edema.     Left lower leg: No edema.  Neurological:     Mental Status: She is alert.           Assessment & Plan:   Mid back pain - Plan: DG Thoracic Spine W/Swimmers, DG Lumbar Spine Complete Patient's urinalysis in the emergency room showed no urinary tract infection.  She continues to have significant pain.  History is very difficult because of her aphasia.  However exam today elicits the most pain with palpation of the spinous processes in the thoracic spine.  Therefore we will get an x-ray of the thoracic spine because of concern about vertebral fracture.  Also x-ray of the lumbar spine while doing this.  Meanwhile start the patient on Tylenol 500 mg scheduled every 6 hours for pain.  Avoid NSAIDs due to chronic kidney disease.  Start checking blood pressure daily at the nursing home.  I has to call me if her blood pressures greater than 1 be over 90 or less than 110/60.  We will adjust her medication based on the  response from the nursing home.  Repeat creatinine in the emergency room was down to 2.38.

## 2022-12-05 DIAGNOSIS — M6281 Muscle weakness (generalized): Secondary | ICD-10-CM | POA: Diagnosis not present

## 2022-12-06 DIAGNOSIS — R2689 Other abnormalities of gait and mobility: Secondary | ICD-10-CM | POA: Diagnosis not present

## 2022-12-12 DIAGNOSIS — M6281 Muscle weakness (generalized): Secondary | ICD-10-CM | POA: Diagnosis not present

## 2022-12-14 DIAGNOSIS — M6281 Muscle weakness (generalized): Secondary | ICD-10-CM | POA: Diagnosis not present

## 2022-12-15 DIAGNOSIS — R2689 Other abnormalities of gait and mobility: Secondary | ICD-10-CM | POA: Diagnosis not present

## 2022-12-18 DIAGNOSIS — R2689 Other abnormalities of gait and mobility: Secondary | ICD-10-CM | POA: Diagnosis not present

## 2022-12-19 DIAGNOSIS — M6281 Muscle weakness (generalized): Secondary | ICD-10-CM | POA: Diagnosis not present

## 2022-12-20 DIAGNOSIS — R2689 Other abnormalities of gait and mobility: Secondary | ICD-10-CM | POA: Diagnosis not present

## 2022-12-21 DIAGNOSIS — M6281 Muscle weakness (generalized): Secondary | ICD-10-CM | POA: Diagnosis not present

## 2022-12-25 DIAGNOSIS — R2689 Other abnormalities of gait and mobility: Secondary | ICD-10-CM | POA: Diagnosis not present

## 2022-12-26 DIAGNOSIS — M6281 Muscle weakness (generalized): Secondary | ICD-10-CM | POA: Diagnosis not present

## 2022-12-26 DIAGNOSIS — N184 Chronic kidney disease, stage 4 (severe): Secondary | ICD-10-CM | POA: Diagnosis not present

## 2022-12-26 DIAGNOSIS — N179 Acute kidney failure, unspecified: Secondary | ICD-10-CM | POA: Diagnosis not present

## 2022-12-26 DIAGNOSIS — I129 Hypertensive chronic kidney disease with stage 1 through stage 4 chronic kidney disease, or unspecified chronic kidney disease: Secondary | ICD-10-CM | POA: Diagnosis not present

## 2022-12-26 DIAGNOSIS — R809 Proteinuria, unspecified: Secondary | ICD-10-CM | POA: Diagnosis not present

## 2022-12-27 DIAGNOSIS — R2689 Other abnormalities of gait and mobility: Secondary | ICD-10-CM | POA: Diagnosis not present

## 2022-12-28 DIAGNOSIS — M6281 Muscle weakness (generalized): Secondary | ICD-10-CM | POA: Diagnosis not present

## 2023-01-01 DIAGNOSIS — R2689 Other abnormalities of gait and mobility: Secondary | ICD-10-CM | POA: Diagnosis not present

## 2023-01-02 DIAGNOSIS — M6281 Muscle weakness (generalized): Secondary | ICD-10-CM | POA: Diagnosis not present

## 2023-01-03 DIAGNOSIS — R2689 Other abnormalities of gait and mobility: Secondary | ICD-10-CM | POA: Diagnosis not present

## 2023-01-03 NOTE — Progress Notes (Unsigned)
Subjective:   [name] is a 88 y.o. female who presents for Medicare Annual (Subsequent) preventive examination.  Visit Complete: {VISITMETHOD:[phone]}  Patient Medicare AWV questionnaire was completed by the patient on ***; I have confirmed that all information answered by patient is correct and no changes since this date.  Review of Systems    ***       Objective:    There were no vitals filed for this visit. There is no height or weight on file to calculate BMI.     11/28/2022   12:02 PM 08/12/2021    3:51 PM 12/19/2020    9:28 AM 07/11/2018   11:01 AM 04/28/2018    9:39 AM 04/26/2018    2:40 PM 04/17/2018   11:43 AM  Advanced Directives  Does Patient Have a Medical Advance Directive? No Yes Yes Yes No No No  Type of Special educational needs teacher of [place];Living will Healthcare Power of [place];Living will Living will     Does patient want to make changes to medical advance directive? No - Patient declined  No - Patient declined      Copy of Healthcare Power of Attorney in Chart?  Yes - validated most recent copy scanned in chart (See row information) No - copy requested      Would patient like information on creating a medical advance directive? No - Patient declined     No - Patient declined No - Patient declined    Current Medications (verified) Outpatient Encounter Medications as of 01/04/2023  Medication Sig   acetaminophen (TYLENOL) 500 MG tablet Take 1 tablet (500 mg total) by mouth every 6 (six) hours as needed.   amLODipine (NORVASC) 10 MG tablet Take 1 tablet (10 mg total) by mouth daily.   carvedilol (COREG) 25 MG tablet TAKE (1) TABLET BY MOUTH TWICE DAILY.   cephALEXin (KEFLEX) 500 MG capsule Take 1 capsule (500 mg total) by mouth 3 (three) times daily.   clopidogrel (PLAVIX) 75 MG tablet TAKE (1) TABLET BY MOUTH ONCE DAILY.   dorzolamide-timolol (COSOPT) 22.3-6.8 MG/ML ophthalmic solution Place 1 drop into both eyes 2 (two) times daily.    Multiple Vitamins-Minerals (CENTRUM ULTRA WOMENS) TABS Take 1 tablet by mouth daily.   (Patient not taking: Reported on 12/04/2022)   pantoprazole (PROTONIX) 40 MG tablet TAKE (1) TABLET BY MOUTH ONCE DAILY.   ROCKLATAN 0.02-0.005 % SOLN Apply 1 drop to eye daily.   rosuvastatin (CRESTOR) 20 MG tablet TAKE (1) TABLET BY MOUTH ONCE DAILY.   senna (SENOKOT) 8.6 MG tablet Take 1 tablet by mouth at bedtime.    valsartan (DIOVAN) 320 MG tablet TAKE (1) TABLET BY MOUTH ONCE DAILY.   No facility-administered encounter medications on file as of 01/04/2023.    Allergies (verified) Patient has no known allergies.   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)   Past Surgical History:  Procedure Laterality Date   APPENDECTOMY     ESOPHAGEAL DILATION N/A  07/11/2018   Procedure: ESOPHAGEAL DILATION;  Surgeon: Malissa Hippo, MD;  [facility];  Service: Endoscopy;  Laterality: N/A;   ESOPHAGOGASTRODUODENOSCOPY N/A 04/28/2018   Procedure: ESOPHAGOGASTRODUODENOSCOPY (EGD);  Surgeon: Malissa Hippo, MD;  [facility];  Service: Endoscopy;  Laterality: N/A;   ESOPHAGOGASTRODUODENOSCOPY N/A 07/11/2018   Procedure: ESOPHAGOGASTRODUODENOSCOPY (EGD);  Surgeon: Malissa Hippo, MD;  [facility]: [facility];  Service: Endoscopy;  Laterality: N/A;  1200   HARDWARE REMOVAL Right 10/05/2014   Procedure: HARDWARE REMOVAL;  Surgeon: Jodi Geralds, MD;  Location: [facility] OR;  Service: Orthopedics;  Laterality: Right;   INTRAMEDULLARY (IM) NAIL INTERTROCHANTERIC Right 06/12/2014   Procedure:  INTRAMEDULLARY (IM) NAIL INTERTROCHANTRIC RIGHT HIP;  Surgeon: Harvie Junior, MD;  Location: [facility] OR;  Service: Orthopedics;  Laterality: Right;   kidney stones     LITHOTRIPSY     PEG PLACEMENT     and removal   PEG TUBE REMOVAL     RADIOLOGY WITH ANESTHESIA  04/22/2012   Procedure: RADIOLOGY WITH ANESTHESIA;  Surgeon: Oneal Grout, MD;  Location: [facility] OR;  Service: Radiology;  Laterality: N/A;  VERTEBRAL ARTERY STENT PLACEMENT AND CEREBRAL ANGIOGRAM   TONSILLECTOMY     TYMPANOPLASTY     Family History  Problem Relation Age of Onset   CVA Father    Social History   Socioeconomic History   Marital status: Married    Spouse name: [name]   Number of children: 0   Years of education: Not on file   Highest education level: Not on file  Occupational History    Comment: Retired Charity fundraiser  Tobacco Use   Smoking status: Former    Current packs/day: 0.00    Types: Cigarettes    Quit date: 10/02/1983    Years since quitting: 39.2   Smokeless tobacco: Never  Vaping Use   Vaping status: Never Used  Substance and Sexual Activity   Alcohol use: Not Currently   Drug use: No   Sexual activity: Not Currently  Other Topics Concern   Not on file  Social History Narrative   Lives with husband [name].   2 step children.   Social Determinants of Health   Financial Resource Strain: Low Risk  (08/12/2021)   Overall Financial Resource Strain (CARDIA)    Difficulty of Paying Living Expenses: Not hard at all  Food Insecurity: No Food Insecurity (08/12/2021)   Hunger Vital Sign    Worried About Running Out of Food in the Last Year: Never true    Ran Out of Food in the Last Year: Never true  Transportation Needs: No Transportation Needs (08/12/2021)   PRAPARE - Administrator, Civil Service (Medical): No    Lack of Transportation (Non-Medical): No  Physical Activity: Inactive (08/12/2021)   Exercise Vital Sign    Days of Exercise per Week: 0 days    Minutes of Exercise per Session: 0  min  Stress: No Stress Concern Present (08/12/2021)   [employer] of Occupational Health - Occupational Stress Questionnaire    Feeling of Stress : Not at all  Social Connections: Moderately Integrated (08/12/2021)   Social Connection and Isolation Panel [NHANES]    Frequency of Communication with Friends and Family: More than three times a week    Frequency of Social Gatherings with Friends and Family: More than three times a week    Attends Religious Services: 1 to 4 times per year    Active Member of [employer] or Organizations:  No    Attends Banker Meetings: Never    Marital Status: Married    Tobacco Counseling Counseling given: Not Answered   Clinical Intake:                        Activities of Daily Living     No data to display          Patient Care Team: Donita Brooks, MD as PCP - General (Family Medicine) Erroll Luna, [facility] (Inactive) as Pharmacist (Pharmacist)  Indicate any recent Medical Services you may have received from other than [facility] providers in the past year (date may be approximate).     Assessment:   This is a routine wellness examination for K[place]a.  Hearing/Vision screen No results found.  Dietary issues and exercise activities discussed:     Goals Addressed   None    Depression Screen    12/04/2022   12:52 PM 11/20/2022   10:23 AM 06/13/2022    9:18 AM 08/12/2021    3:44 PM 12/02/2019    8:40 AM 10/06/2019    3:24 PM  PHQ 2/9 Scores  PHQ - 2 Score 1 0 0 0 0 0  PHQ- 9 Score 2         Fall Risk    12/04/2022   12:51 PM 11/20/2022   10:23 AM 06/13/2022    9:18 AM 08/12/2021    3:52 PM 10/06/2019    3:24 PM  Fall Risk   Falls in the past year? 1 1 0 0 0  Number falls in past yr: 0 0 0 0 0  Injury with Fall? 0 0 0 0 0  Risk for fall due to :  History of fall(s);Impaired balance/gait;Impaired mobility No Fall Risks Impaired balance/gait;Impaired mobility   Follow up  Education provided;Falls prevention  discussed Falls prevention discussed Falls prevention discussed     MEDICARE RISK AT HOME:   TIMED UP AND GO:  Was the test performed?  No    Cognitive Function:        Immunizations Immunization History  Administered Date(s) Administered   Fluad Quad(high Dose 65+) 03/23/2021   Influenza, High Dose Seasonal PF 03/28/2018, 03/04/2019   Influenza-Unspecified 04/14/2020   PFIZER(Purple Top)SARS-COV-2 Vaccination 07/03/2019, 07/25/2019, 05/21/2020   Pneumococcal Polysaccharide-23 02/01/2021   Zoster, Live 02/25/2016    TDAP status: Due, Education has been provided regarding the importance of this vaccine. Advised may receive this vaccine at local pharmacy or Health Dept. Aware to provide a copy of the vaccination record if obtained from local pharmacy or Health Dept. Verbalized acceptance and understanding.  Pneumococcal vaccine status: Due, Education has been provided regarding the importance of this vaccine. Advised may receive this vaccine at local pharmacy or Health Dept. Aware to provide a copy of the vaccination record if obtained from local pharmacy or Health Dept. Verbalized acceptance and understanding.  Covid-19 vaccine status: Information provided on how to obtain vaccines.   Qualifies for Shingles Vaccine? Yes   Zostavax completed Yes   Shingrix Completed?: No.    Education has been provided regarding the importance of this vaccine. Patient has been advised to call insurance company to determine out of pocket expense if they have not yet received this vaccine. Advised may also receive vaccine at local pharmacy or Health Dept. Verbalized acceptance and understanding.  Screening Tests Health Maintenance  Topic Date Due   Medicare Annual Wellness (AWV)  01/22/2023 (Originally 08/13/2022)   Zoster  Vaccines- Shingrix (1 of 2) 02/20/2023 (Originally 03/29/1954)   Pneumonia Vaccine 84+ Years old (2 of 2 - PCV) 06/14/2023 (Originally 02/01/2022)   INFLUENZA VACCINE   01/04/2023   HPV VACCINES  Aged Out   DTaP/Tdap/Td  Discontinued   DEXA SCAN  Discontinued   COVID-19 Vaccine  Discontinued    Health Maintenance  There are no preventive care reminders to display for this patient.  Colorectal cancer screening: No longer required.   Mammogram status: No longer required due to age and preference .  Bone Density status: No longer required  Lung Cancer Screening: (Low Dose CT Chest recommended if Age 20-80 years, 20 pack-year currently smoking OR have quit w/in 15years.) does not qualify.   Lung Cancer Screening Referral: n/a  Additional Screening:  Hepatitis C Screening: does not qualify  Vision Screening: Recommended annual ophthalmology exams for early detection of glaucoma and other disorders of the eye. Is the patient up to date with their annual eye exam?  {YES/NO:21197} Who is the provider or what is the name of the office in which the patient attends annual eye exams? *** If pt is not established with a provider, would they like to be referred to a provider to establish care? {YES/NO:21197}.   Dental Screening: Recommended annual dental exams for proper oral hygiene  Community Resource Referral / Chronic Care Management: CRR required this visit?  {YES/NO:21197}  CCM required this visit?  {CCM Required choices:[phone]}     Plan:     I have personally reviewed and noted the following in the patient's chart:   Medical and social history Use of alcohol, tobacco or illicit drugs  Current medications and supplements including opioid prescriptions. {Opioid Prescriptions:[phone]} Functional ability and status Nutritional status Physical activity Advanced directives List of other physicians Hospitalizations, surgeries, and ER visits in previous 12 months Vitals Screenings to include cognitive, depression, and falls Referrals and appointments  In addition, I have reviewed and discussed with patient certain preventive  protocols, quality metrics, and best practice recommendations. A written personalized care plan for preventive services as well as general preventive health recommendations were provided to patient.     Kandis Fantasia JAARS, [place]   1/61/0960   After Visit Summary: {CHL AMB AWV After Visit Summary:[phone]}  Nurse Notes: ***

## 2023-01-03 NOTE — Patient Instructions (Incomplete)
[name] , Thank you for taking time to come for your Medicare Wellness Visit. I appreciate your ongoing commitment to your health goals. Please review the following plan we discussed and let me know if I can assist you in the future.   Referrals/Orders/Follow-Ups/Clinician Recommendations: Aim for 30 minutes of exercise or brisk walking, 6-8 glasses of water, and 5 servings of fruits and vegetables each day.  This is a list of the screening recommended for you and due dates:  Health Maintenance  Topic Date Due   Medicare Annual Wellness Visit  01/22/2023*   Zoster (Shingles) Vaccine (1 of 2) 02/20/2023*   Pneumonia Vaccine (2 of 2 - PCV) 06/14/2023*   Flu Shot  01/04/2023   HPV Vaccine  Aged Out   DTaP/Tdap/Td vaccine  Discontinued   DEXA scan (bone density measurement)  Discontinued   COVID-19 Vaccine  Discontinued  *Topic was postponed. The date shown is not the original due date.    Advanced directives: (In Chart) A copy of your advanced directives are scanned into your chart should your provider ever need it.  Next Medicare Annual Wellness Visit scheduled for next year: Yes  Preventive Care 14 Years and Older, Female Preventive care refers to lifestyle choices and visits with your health care provider that can promote health and wellness. What does preventive care include? A yearly physical exam. This is also called an annual well check. Dental exams once or twice a year. Routine eye exams. Ask your health care provider how often you should have your eyes checked. Personal lifestyle choices, including: Daily care of your teeth and gums. Regular physical activity. Eating a healthy diet. Avoiding tobacco and drug use. Limiting alcohol use. Practicing safe sex. Taking low-dose aspirin every day. Taking vitamin and mineral supplements as recommended by your health care provider. What happens during an annual well check? The services and screenings done by your health care  provider during your annual well check will depend on your age, overall health, lifestyle risk factors, and family history of disease. Counseling  Your health care provider may ask you questions about your: Alcohol use. Tobacco use. Drug use. Emotional well-being. Home and relationship well-being. Sexual activity. Eating habits. History of falls. Memory and ability to understand (cognition). Work and work Astronomer. Reproductive health. Screening  You may have the following tests or measurements: Height, weight, and BMI. Blood pressure. Lipid and cholesterol levels. These may be checked every 5 years, or more frequently if you are over 37 years old. Skin check. Lung cancer screening. You may have this screening every year starting at age 44 if you have a 30-pack-year history of smoking and currently smoke or have quit within the past 15 years. Fecal occult blood test (FOBT) of the stool. You may have this test every year starting at age 81. Flexible sigmoidoscopy or colonoscopy. You may have a sigmoidoscopy every 5 years or a colonoscopy every 10 years starting at age 61. Hepatitis C blood test. Hepatitis B blood test. Sexually transmitted disease (STD) testing. Diabetes screening. This is done by checking your blood sugar (glucose) after you have not eaten for a while (fasting). You may have this done every 1-3 years. Bone density scan. This is done to screen for osteoporosis. You may have this done starting at age 71. Mammogram. This may be done every 1-2 years. Talk to your health care provider about how often you should have regular mammograms. Talk with your health care provider about your test results, treatment options,  and if necessary, the need for more tests. Vaccines  Your health care provider may recommend certain vaccines, such as: Influenza vaccine. This is recommended every year. Tetanus, diphtheria, and acellular pertussis (Tdap, Td) vaccine. You may need a Td  booster every 10 years. Zoster vaccine. You may need this after age 89. Pneumococcal 13-valent conjugate (PCV13) vaccine. One dose is recommended after age 79. Pneumococcal polysaccharide (PPSV23) vaccine. One dose is recommended after age 107. Talk to your health care provider about which screenings and vaccines you need and how often you need them. This information is not intended to replace advice given to you by your health care provider. Make sure you discuss any questions you have with your health care provider. Document Released: 06/18/2015 Document Revised: 02/09/2016 Document Reviewed: 03/23/2015 Elsevier Interactive Patient Education  2017 [employer].  Fall Prevention in the Home Falls can cause injuries. They can happen to people of all ages. There are many things you can do to make your home safe and to help prevent falls. What can I do on the outside of my home? Regularly fix the edges of walkways and driveways and fix any cracks. Remove anything that might make you trip as you walk through a door, such as a raised step or threshold. Trim any bushes or trees on the path to your home. Use bright outdoor lighting. Clear any walking paths of anything that might make someone trip, such as rocks or tools. Regularly check to see if handrails are loose or broken. Make sure that both sides of any steps have handrails. Any raised decks and porches should have guardrails on the edges. Have any leaves, snow, or ice cleared regularly. Use sand or salt on walking paths during winter. Clean up any spills in your garage right away. This includes oil or grease spills. What can I do in the bathroom? Use night lights. Install grab bars by the toilet and in the tub and shower. Do not use towel bars as grab bars. Use non-skid mats or decals in the tub or shower. If you need to sit down in the shower, use a plastic, non-slip stool. Keep the floor dry. Clean up any water that spills on the floor  as soon as it happens. Remove soap buildup in the tub or shower regularly. Attach bath mats securely with double-sided non-slip rug tape. Do not have throw rugs and other things on the floor that can make you trip. What can I do in the bedroom? Use night lights. Make sure that you have a light by your bed that is easy to reach. Do not use any sheets or blankets that are too big for your bed. They should not hang down onto the floor. Have a firm chair that has side arms. You can use this for support while you get dressed. Do not have throw rugs and other things on the floor that can make you trip. What can I do in the kitchen? Clean up any spills right away. Avoid walking on wet floors. Keep items that you use a lot in easy-to-reach places. If you need to reach something above you, use a strong step stool that has a grab bar. Keep electrical cords out of the way. Do not use floor polish or wax that makes floors slippery. If you must use wax, use non-skid floor wax. Do not have throw rugs and other things on the floor that can make you trip. What can I do with my stairs? Do not leave  any items on the stairs. Make sure that there are handrails on both sides of the stairs and use them. Fix handrails that are broken or loose. Make sure that handrails are as long as the stairways. Check any carpeting to make sure that it is firmly attached to the stairs. Fix any carpet that is loose or worn. Avoid having throw rugs at the top or bottom of the stairs. If you do have throw rugs, attach them to the floor with carpet tape. Make sure that you have a light switch at the top of the stairs and the bottom of the stairs. If you do not have them, ask someone to add them for you. What else can I do to help prevent falls? Wear shoes that: Do not have high heels. Have rubber bottoms. Are comfortable and fit you well. Are closed at the toe. Do not wear sandals. If you use a stepladder: Make sure that it is  fully opened. Do not climb a closed stepladder. Make sure that both sides of the stepladder are locked into place. Ask someone to hold it for you, if possible. Clearly mark and make sure that you can see: Any grab bars or handrails. First and last steps. Where the edge of each step is. Use tools that help you move around (mobility aids) if they are needed. These include: Canes. Walkers. Scooters. Crutches. Turn on the lights when you go into a dark area. Replace any light bulbs as soon as they burn out. Set up your furniture so you have a clear path. Avoid moving your furniture around. If any of your floors are uneven, fix them. If there are any pets around you, be aware of where they are. Review your medicines with your doctor. Some medicines can make you feel dizzy. This can increase your chance of falling. Ask your doctor what other things that you can do to help prevent falls. This information is not intended to replace advice given to you by your health care provider. Make sure you discuss any questions you have with your health care provider. Document Released: 03/18/2009 Document Revised: 10/28/2015 Document Reviewed: 06/26/2014 Elsevier Interactive Patient Education  2017 [employer].

## 2023-01-04 ENCOUNTER — Ambulatory Visit (INDEPENDENT_AMBULATORY_CARE_PROVIDER_SITE_OTHER): Payer: PPO

## 2023-01-04 VITALS — Ht 62.0 in | Wt 147.0 lb

## 2023-01-04 DIAGNOSIS — Z0000 Encounter for general adult medical examination without abnormal findings: Secondary | ICD-10-CM | POA: Diagnosis not present

## 2023-01-04 DIAGNOSIS — M6281 Muscle weakness (generalized): Secondary | ICD-10-CM | POA: Diagnosis not present

## 2023-01-09 ENCOUNTER — Other Ambulatory Visit (HOSPITAL_COMMUNITY): Payer: Self-pay | Admitting: Nephrology

## 2023-01-09 ENCOUNTER — Other Ambulatory Visit: Payer: Self-pay | Admitting: Family Medicine

## 2023-01-09 DIAGNOSIS — N178 Other acute kidney failure: Secondary | ICD-10-CM

## 2023-01-09 DIAGNOSIS — M6281 Muscle weakness (generalized): Secondary | ICD-10-CM | POA: Diagnosis not present

## 2023-01-09 DIAGNOSIS — N184 Chronic kidney disease, stage 4 (severe): Secondary | ICD-10-CM

## 2023-01-09 DIAGNOSIS — I129 Hypertensive chronic kidney disease with stage 1 through stage 4 chronic kidney disease, or unspecified chronic kidney disease: Secondary | ICD-10-CM

## 2023-01-10 DIAGNOSIS — R2689 Other abnormalities of gait and mobility: Secondary | ICD-10-CM | POA: Diagnosis not present

## 2023-01-10 NOTE — Telephone Encounter (Signed)
Request is too soon for refill. Last refill 08/31/22 for 90 and 2 refills.E-Prescribing Status: Receipt confirmed by pharmacy (08/31/2022  4:49 PM EDT).  Requested Prescriptions  Pending Prescriptions Disp Refills   rosuvastatin (CRESTOR) 10 MG tablet [Pharmacy Med Name: ROSUVASTATIN CALCIUM 10 MG TAB] 30 tablet 0    Sig: TAKE (1) TABLET BY MOUTH ONCE DAILY.     Cardiovascular:  Antilipid - Statins 2 Failed - 01/09/2023 10:14 AM      Failed - Cr in normal range and within 360 days    Creat  Date Value Ref Range Status  11/20/2022 2.60 (H) 0.60 - 0.95 mg/dL Final   Creatinine, Ser  Date Value Ref Range Status  11/28/2022 2.39 (H) 0.44 - 1.00 mg/dL Final   Creatinine, Urine  Date Value Ref Range Status  06/14/2022 122 20 - 275 mg/dL Final         Failed - Valid encounter within last 12 months    Recent Outpatient Visits           1 year ago Stage 3b chronic kidney disease (HCC)   [facility] Pickard, Priscille Heidelberg, MD   1 year ago UTI symptoms   [facility] Valentino Nose, NP   2 years ago Urinary tract infection without hematuria, site unspecified   [facility] Pickard, Priscille Heidelberg, MD   3 years ago CKD (chronic kidney disease) stage 4, GFR 15-29 ml/min (HCC)   [facility] Jenne Pane, Crystal A, FNP              Failed - Lipid Panel in normal range within the last 12 months    Cholesterol  Date Value Ref Range Status  06/07/2022 152 <200 mg/dL Final   LDL Cholesterol (Calc)  Date Value Ref Range Status  06/07/2022 82 mg/dL (calc) Final    Comment:    Reference range: <100 . Desirable range <100 mg/dL for primary prevention;   <70 mg/dL for patients with CHD or diabetic patients  with > or = 2 CHD risk factors. Marland Kitchen LDL-C is now calculated using the Martin-Hopkins  calculation, which is a validated novel method providing  better accuracy than the Friedewald equation in the  estimation of LDL-C.  Horald Pollen  et al. Lenox Ahr. 4010;272(53): 2061-2068  (http://education.[facility].com/faq/FAQ164)    HDL  Date Value Ref Range Status  06/07/2022 48 (L) > OR = 50 mg/dL Final   Triglycerides  Date Value Ref Range Status  06/07/2022 127 <150 mg/dL Final         Passed - Patient is not pregnant

## 2023-01-11 DIAGNOSIS — M6281 Muscle weakness (generalized): Secondary | ICD-10-CM | POA: Diagnosis not present

## 2023-01-12 DIAGNOSIS — R2689 Other abnormalities of gait and mobility: Secondary | ICD-10-CM | POA: Diagnosis not present

## 2023-01-15 ENCOUNTER — Other Ambulatory Visit: Payer: Self-pay | Admitting: Family Medicine

## 2023-01-15 DIAGNOSIS — R2689 Other abnormalities of gait and mobility: Secondary | ICD-10-CM | POA: Diagnosis not present

## 2023-01-16 DIAGNOSIS — M6281 Muscle weakness (generalized): Secondary | ICD-10-CM | POA: Diagnosis not present

## 2023-01-17 ENCOUNTER — Telehealth: Payer: Self-pay | Admitting: Family Medicine

## 2023-01-17 NOTE — Telephone Encounter (Signed)
Received call from [facility] from [facility] to follow up on refill request for rosuvastatin (CRESTOR) 20 MG tablet .  Misty stated patient's refill cycle begins on 23rd, and medication needs to be packaged to go out to patient's facility. Requesting for script to be approved by Friday at the very latest to reach the facility by Monday.  Pharmacy:  [facility] [place] - [address] [place], [place] [address] Phone: [phone]  Fax: [phone]   Please advise pharmacist.

## 2023-01-17 NOTE — Telephone Encounter (Signed)
Unable to refill per protocol, Rx request is too soon. Last refill 08/31/22 for 90 and 2 refills.  Requested Prescriptions  Pending Prescriptions Disp Refills   rosuvastatin (CRESTOR) 10 MG tablet [Pharmacy Med Name: ROSUVASTATIN CALCIUM 10 MG TAB] 30 tablet 0    Sig: TAKE (1) TABLET BY MOUTH ONCE DAILY.     Cardiovascular:  Antilipid - Statins 2 Failed - 01/15/2023  2:48 PM      Failed - Cr in normal range and within 360 days    Creat  Date Value Ref Range Status  11/20/2022 2.60 (H) 0.60 - 0.95 mg/dL Final   Creatinine, Ser  Date Value Ref Range Status  11/28/2022 2.39 (H) 0.44 - 1.00 mg/dL Final   Creatinine, Urine  Date Value Ref Range Status  06/14/2022 122 20 - 275 mg/dL Final         Failed - Valid encounter within last 12 months    Recent Outpatient Visits           1 year ago Stage 3b chronic kidney disease (HCC)   [facility] Pickard, Priscille Heidelberg, MD   1 year ago UTI symptoms   [facility] Valentino Nose, NP   2 years ago Urinary tract infection without hematuria, site unspecified   [facility] Pickard, Priscille Heidelberg, MD   3 years ago CKD (chronic kidney disease) stage 4, GFR 15-29 ml/min (HCC)   [facility] Jenne Pane, Crystal A, FNP              Failed - Lipid Panel in normal range within the last 12 months    Cholesterol  Date Value Ref Range Status  06/07/2022 152 <200 mg/dL Final   LDL Cholesterol (Calc)  Date Value Ref Range Status  06/07/2022 82 mg/dL (calc) Final    Comment:    Reference range: <100 . Desirable range <100 mg/dL for primary prevention;   <70 mg/dL for patients with CHD or diabetic patients  with > or = 2 CHD risk factors. Marland Kitchen LDL-C is now calculated using the Martin-Hopkins  calculation, which is a validated novel method providing  better accuracy than the Friedewald equation in the  estimation of LDL-C.  Horald Pollen et al. Lenox Ahr. 0981;191(47): 2061-2068   (http://education.[employer].com/faq/FAQ164)    HDL  Date Value Ref Range Status  06/07/2022 48 (L) > OR = 50 mg/dL Final   Triglycerides  Date Value Ref Range Status  06/07/2022 127 <150 mg/dL Final         Passed - Patient is not pregnant

## 2023-01-18 ENCOUNTER — Other Ambulatory Visit: Payer: Self-pay

## 2023-01-18 DIAGNOSIS — I10 Essential (primary) hypertension: Secondary | ICD-10-CM

## 2023-01-18 DIAGNOSIS — M6281 Muscle weakness (generalized): Secondary | ICD-10-CM | POA: Diagnosis not present

## 2023-01-18 DIAGNOSIS — Z8673 Personal history of transient ischemic attack (TIA), and cerebral infarction without residual deficits: Secondary | ICD-10-CM

## 2023-01-18 MED ORDER — ROSUVASTATIN CALCIUM 20 MG PO TABS
ORAL_TABLET | ORAL | 1 refills | Status: DC
Start: 2023-01-18 — End: 2023-01-18

## 2023-01-19 DIAGNOSIS — R2689 Other abnormalities of gait and mobility: Secondary | ICD-10-CM | POA: Diagnosis not present

## 2023-01-22 DIAGNOSIS — R2689 Other abnormalities of gait and mobility: Secondary | ICD-10-CM | POA: Diagnosis not present

## 2023-01-23 DIAGNOSIS — M6281 Muscle weakness (generalized): Secondary | ICD-10-CM | POA: Diagnosis not present

## 2023-01-24 ENCOUNTER — Ambulatory Visit (HOSPITAL_COMMUNITY)
Admission: RE | Admit: 2023-01-24 | Discharge: 2023-01-24 | Disposition: A | Discharge status: Home / Self Care | Payer: PPO | Source: Ambulatory Visit | Attending: Nephrology | Admitting: Nephrology

## 2023-01-24 DIAGNOSIS — N178 Other acute kidney failure: Secondary | ICD-10-CM | POA: Insufficient documentation

## 2023-01-24 DIAGNOSIS — N184 Chronic kidney disease, stage 4 (severe): Secondary | ICD-10-CM | POA: Diagnosis not present

## 2023-01-24 DIAGNOSIS — I129 Hypertensive chronic kidney disease with stage 1 through stage 4 chronic kidney disease, or unspecified chronic kidney disease: Secondary | ICD-10-CM | POA: Insufficient documentation

## 2023-01-24 DIAGNOSIS — N200 Calculus of kidney: Secondary | ICD-10-CM | POA: Diagnosis not present

## 2023-01-24 DIAGNOSIS — R809 Proteinuria, unspecified: Secondary | ICD-10-CM | POA: Diagnosis not present

## 2023-01-25 DIAGNOSIS — M6281 Muscle weakness (generalized): Secondary | ICD-10-CM | POA: Diagnosis not present

## 2023-01-26 DIAGNOSIS — R2689 Other abnormalities of gait and mobility: Secondary | ICD-10-CM | POA: Diagnosis not present

## 2023-01-29 DIAGNOSIS — R2689 Other abnormalities of gait and mobility: Secondary | ICD-10-CM | POA: Diagnosis not present

## 2023-01-30 DIAGNOSIS — M6281 Muscle weakness (generalized): Secondary | ICD-10-CM | POA: Diagnosis not present

## 2023-01-31 DIAGNOSIS — M6281 Muscle weakness (generalized): Secondary | ICD-10-CM | POA: Diagnosis not present

## 2023-02-02 DIAGNOSIS — I129 Hypertensive chronic kidney disease with stage 1 through stage 4 chronic kidney disease, or unspecified chronic kidney disease: Secondary | ICD-10-CM | POA: Diagnosis not present

## 2023-02-02 DIAGNOSIS — I5032 Chronic diastolic (congestive) heart failure: Secondary | ICD-10-CM | POA: Diagnosis not present

## 2023-02-02 DIAGNOSIS — N2581 Secondary hyperparathyroidism of renal origin: Secondary | ICD-10-CM | POA: Diagnosis not present

## 2023-02-02 DIAGNOSIS — R809 Proteinuria, unspecified: Secondary | ICD-10-CM | POA: Diagnosis not present

## 2023-02-02 DIAGNOSIS — N179 Acute kidney failure, unspecified: Secondary | ICD-10-CM | POA: Diagnosis not present

## 2023-02-02 DIAGNOSIS — N184 Chronic kidney disease, stage 4 (severe): Secondary | ICD-10-CM | POA: Diagnosis not present

## 2023-02-06 DIAGNOSIS — M6281 Muscle weakness (generalized): Secondary | ICD-10-CM | POA: Diagnosis not present

## 2023-02-07 ENCOUNTER — Other Ambulatory Visit: Payer: Self-pay | Admitting: Family Medicine

## 2023-02-08 ENCOUNTER — Other Ambulatory Visit: Payer: Self-pay

## 2023-02-08 ENCOUNTER — Emergency Department (HOSPITAL_COMMUNITY)
Admission: EM | Admit: 2023-02-08 | Discharge: 2023-02-08 | Disposition: A | Discharge status: Home / Self Care | Payer: PPO | Attending: Emergency Medicine | Admitting: Emergency Medicine

## 2023-02-08 ENCOUNTER — Encounter (HOSPITAL_COMMUNITY): Payer: Self-pay | Admitting: Emergency Medicine

## 2023-02-08 DIAGNOSIS — Z7901 Long term (current) use of anticoagulants: Secondary | ICD-10-CM | POA: Diagnosis not present

## 2023-02-08 DIAGNOSIS — N3000 Acute cystitis without hematuria: Secondary | ICD-10-CM | POA: Diagnosis not present

## 2023-02-08 DIAGNOSIS — R7989 Other specified abnormal findings of blood chemistry: Secondary | ICD-10-CM | POA: Diagnosis present

## 2023-02-08 DIAGNOSIS — A499 Bacterial infection, unspecified: Secondary | ICD-10-CM | POA: Insufficient documentation

## 2023-02-08 DIAGNOSIS — I129 Hypertensive chronic kidney disease with stage 1 through stage 4 chronic kidney disease, or unspecified chronic kidney disease: Secondary | ICD-10-CM | POA: Diagnosis not present

## 2023-02-08 DIAGNOSIS — N2581 Secondary hyperparathyroidism of renal origin: Secondary | ICD-10-CM | POA: Diagnosis not present

## 2023-02-08 DIAGNOSIS — N189 Chronic kidney disease, unspecified: Secondary | ICD-10-CM | POA: Insufficient documentation

## 2023-02-08 DIAGNOSIS — Z1612 Extended spectrum beta lactamase (ESBL) resistance: Secondary | ICD-10-CM

## 2023-02-08 DIAGNOSIS — N179 Acute kidney failure, unspecified: Secondary | ICD-10-CM | POA: Diagnosis not present

## 2023-02-08 DIAGNOSIS — R809 Proteinuria, unspecified: Secondary | ICD-10-CM | POA: Diagnosis not present

## 2023-02-08 DIAGNOSIS — N184 Chronic kidney disease, stage 4 (severe): Secondary | ICD-10-CM | POA: Diagnosis not present

## 2023-02-08 DIAGNOSIS — Z1630 Resistance to unspecified antimicrobial drugs: Secondary | ICD-10-CM | POA: Diagnosis not present

## 2023-02-08 LAB — URINALYSIS, W/ REFLEX TO CULTURE (INFECTION SUSPECTED)
Bilirubin Urine: NEGATIVE
Glucose, UA: NEGATIVE mg/dL
Hgb urine dipstick: NEGATIVE
Ketones, ur: NEGATIVE mg/dL
Nitrite: NEGATIVE
Protein, ur: 30 mg/dL — AB
Specific Gravity, Urine: 1.016 (ref 1.005–1.030)
WBC, UA: >50 WBC/hpf (ref 0–5)
pH: 5 (ref 5.0–8.0)

## 2023-02-08 LAB — CBC WITH DIFFERENTIAL/PLATELET
Abs Immature Granulocytes: 0.03 10*3/uL (ref 0.00–0.07)
Basophils Absolute: 0.1 10*3/uL (ref 0.0–0.1)
Basophils Relative: 1 %
Eosinophils Absolute: 0.3 10*3/uL (ref 0.0–0.5)
Eosinophils Relative: 4 %
HCT: 35.2 % — ABNORMAL LOW (ref 36.0–46.0)
Hemoglobin: 10.9 g/dL — ABNORMAL LOW (ref 12.0–15.0)
Immature Granulocytes: 0 %
Lymphocytes Relative: 14 %
Lymphs Abs: 0.9 10*3/uL (ref 0.7–4.0)
MCH: 26.8 pg (ref 26.0–34.0)
MCHC: 31 g/dL (ref 30.0–36.0)
MCV: 86.5 fL (ref 80.0–100.0)
Monocytes Absolute: 0.6 10*3/uL (ref 0.1–1.0)
Monocytes Relative: 9 %
Neutro Abs: 5 10*3/uL (ref 1.7–7.7)
Neutrophils Relative %: 72 %
Platelets: 225 10*3/uL (ref 150–400)
RBC: 4.07 MIL/uL (ref 3.87–5.11)
RDW: 14.6 % (ref 11.5–15.5)
WBC: 6.9 10*3/uL (ref 4.0–10.5)
nRBC: 0 % (ref 0.0–0.2)

## 2023-02-08 LAB — COMPREHENSIVE METABOLIC PANEL
ALT: 17 U/L (ref 0–44)
AST: 17 U/L (ref 15–41)
Albumin: 3.4 g/dL — ABNORMAL LOW (ref 3.5–5.0)
Alkaline Phosphatase: 83 U/L (ref 38–126)
Anion gap: 12 (ref 5–15)
BUN: 39 mg/dL — ABNORMAL HIGH (ref 8–23)
CO2: 23 mmol/L (ref 22–32)
Calcium: 8.6 mg/dL — ABNORMAL LOW (ref 8.9–10.3)
Chloride: 109 mmol/L (ref 98–111)
Creatinine, Ser: 2.96 mg/dL — ABNORMAL HIGH (ref 0.44–1.00)
GFR, Estimated: 15 mL/min — ABNORMAL LOW (ref >60)
Glucose, Bld: 121 mg/dL — ABNORMAL HIGH (ref 70–99)
Potassium: 3.8 mmol/L (ref 3.5–5.1)
Sodium: 144 mmol/L (ref 135–145)
Total Bilirubin: 0.6 mg/dL (ref 0.3–1.2)
Total Protein: 6.2 g/dL — ABNORMAL LOW (ref 6.5–8.1)

## 2023-02-08 MED ORDER — FOSFOMYCIN TROMETHAMINE 3 G PO PACK
3.0000 g | PACK | Freq: Once | ORAL | Status: AC
  Administered 2023-02-08: 3 g via ORAL
  Filled 2023-02-08: qty 3

## 2023-02-08 MED ORDER — SODIUM CHLORIDE 0.9 % IV SOLN
1.0000 g | Freq: Once | INTRAVENOUS | Status: AC
  Administered 2023-02-08: 1 g via INTRAVENOUS
  Filled 2023-02-08 (×2): qty 1000

## 2023-02-08 MED ORDER — SODIUM CHLORIDE 0.9 % IV SOLN: INTRAVENOUS | Status: DC

## 2023-02-08 NOTE — ED Triage Notes (Signed)
Pt sent from nephrologist for UTI that grew E-coli (ESBL) and elevated creatinine. Denies pain.

## 2023-02-08 NOTE — ED Provider Notes (Signed)
[place] EMERGENCY DEPARTMENT AT [facility] Provider Note   CSN: [id number] Arrival date & time: 02/08/23  1030     History  Chief Complaint  Patient presents with   Abnormal Lab    [name] is a 88 y.o. female.   Abnormal Lab  88 year old female, history of renal failure creatinine of around 3, on Diovan, rosuvastatin, hydralazine and Plavix as well as Coreg.  Presenting with a complaint of a abnormal test.  She had been seen by Dr. Wolfgang Phoenix with nephrology earlier in the week and a urinalysis was obtained which showed many bacteria, greater than 60 white blood cells and the culture grew extended spectrum beta-lactamase E. coli greater than 100,000 colony forming units.  The resistance was almost universal except to cefepime, Macrobid, pip-tazo and Bactrim.  Unfortunately because of the patient's renal disease she was unable to take Macrobid or Bactrim.  She was sent here for IV antibiotics by nephrology  Of note the patient had some hypotension on arrival at 90/45 but no tachycardia and according to the caretaker who is with her there has been no issues with breathing, there has been no issues with nausea vomiting fevers chills coughing or shortness of breath.  No swelling of the legs, no diarrhea.    Home Medications Prior to Admission medications   Medication Sig Start Date End Date Taking? Authorizing Provider  acetaminophen (TYLENOL) 500 MG tablet Take 1 tablet (500 mg total) by mouth every 6 (six) hours as needed. 12/04/22  Yes Donita Brooks, MD  carvedilol (COREG) 25 MG tablet TAKE (1) TABLET BY MOUTH TWICE DAILY. Patient taking differently: Take 25 mg by mouth 2 (two) times daily with a meal. 08/31/22  Yes Donita Brooks, MD  clopidogrel (PLAVIX) 75 MG tablet TAKE (1) TABLET BY MOUTH ONCE DAILY. Patient taking differently: Take 75 mg by mouth daily. 08/31/22  Yes Donita Brooks, MD  diclofenac Sodium (VOLTAREN) 1 % GEL Apply topically. 12/12/22  Yes  [provider]  dorzolamide-timolol (COSOPT) 22.3-6.8 MG/ML ophthalmic solution Place 1 drop into both eyes 2 (two) times daily. 11/25/20  Yes [provider]  hydrALAZINE (APRESOLINE) 50 MG tablet Take 50 mg by mouth 3 (three) times daily. 12/26/22  Yes [provider]  pantoprazole (PROTONIX) 40 MG tablet TAKE (1) TABLET BY MOUTH ONCE DAILY. Patient taking differently: Take 40 mg by mouth daily. 08/31/22  Yes PickardPriscille Heidelberg, MD  ROCKLATAN 0.02-0.005 % SOLN Apply 1 drop to eye daily. 11/25/20  Yes [provider]  rosuvastatin (CRESTOR) 10 MG tablet Take 10 mg by mouth at bedtime. 12/28/22  Yes [provider]  senna (SENOKOT) 8.6 MG tablet Take 1 tablet by mouth at bedtime.    Yes [provider]  traMADol (ULTRAM) 50 MG tablet Take 50 mg by mouth every 6 (six) hours as needed. 12/20/22  Yes [provider]      Allergies    Patient has no known allergies.    Review of Systems   Review of Systems  All other systems reviewed and are negative.   Physical Exam Updated Vital Signs BP (!) 132/57   Pulse 64   Temp 98.2 F (36.8 C) (Oral)   Resp 16   SpO2 96%  Physical Exam Vitals and nursing note reviewed.  Constitutional:      General: She is not in acute distress.    Appearance: She is well-developed.  HENT:     Head: Normocephalic and atraumatic.  Mouth/Throat:     Pharynx: No oropharyngeal exudate.  Eyes:     General: No scleral icterus.       Right eye: No discharge.        Left eye: No discharge.     Conjunctiva/sclera: Conjunctivae normal.     Pupils: Pupils are equal, round, and reactive to light.  Neck:     Thyroid: No thyromegaly.     Vascular: No JVD.  Cardiovascular:     Rate and Rhythm: Normal rate and regular rhythm.     Heart sounds: Normal heart sounds. No murmur heard.    No friction rub. No gallop.  Pulmonary:     Effort: Pulmonary effort is normal. No respiratory distress.     Breath  sounds: Normal breath sounds. No wheezing or rales.  Abdominal:     General: Bowel sounds are normal. There is no distension.     Palpations: Abdomen is soft. There is no mass.     Tenderness: There is no abdominal tenderness.  Musculoskeletal:        General: No tenderness. Normal range of motion.     Cervical back: Normal range of motion and neck supple.     Right lower leg: No edema.     Left lower leg: No edema.  Lymphadenopathy:     Cervical: No cervical adenopathy.  Skin:    General: Skin is warm and dry.     Findings: No erythema or rash.  Neurological:     Mental Status: She is alert.     Coordination: Coordination normal.     Comments: Some expressive aphasia at baseline for patient, otherwise moves all 4 extremities  Psychiatric:        Behavior: Behavior normal.     ED Results / Procedures / Treatments   Labs (all labs ordered are listed, but only abnormal results are displayed) Labs Reviewed  CBC WITH DIFFERENTIAL/PLATELET - Abnormal; Notable for the following components:      Result Value   Hemoglobin 10.9 (*)    HCT 35.2 (*)    All other components within normal limits  COMPREHENSIVE METABOLIC PANEL - Abnormal; Notable for the following components:   Glucose, Bld 121 (*)    BUN 39 (*)    Creatinine, Ser 2.96 (*)    Calcium 8.6 (*)    Total Protein 6.2 (*)    Albumin 3.4 (*)    GFR, Estimated 15 (*)    All other components within normal limits  URINALYSIS, W/ REFLEX TO CULTURE (INFECTION SUSPECTED) - Abnormal; Notable for the following components:   APPearance HAZY (*)    Protein, ur 30 (*)    Leukocytes,Ua LARGE (*)    Bacteria, UA FEW (*)    All other components within normal limits  URINE CULTURE    EKG None  Radiology No results found.  Procedures Procedures    Medications Ordered in ED Medications  0.9 %  sodium chloride infusion ( Intravenous New Bag/Given 02/08/23 1156)  fosfomycin (MONUROL) packet 3 g (has no administration in time  range)  ertapenem (INVANZ) 1 g in sodium chloride 0.9 % 100 mL IVPB (0 g Intravenous Stopped 02/08/23 1246)    ED Course/ Medical Decision Making/ A&P                                 Medical Decision Making Amount and/or Complexity of Data Reviewed Labs: ordered.  Risk  Prescription drug management. Decision regarding hospitalization.    This patient presents to the ED for concern of abnormal lab, this involves an extensive number of treatment options, and is a complaint that carries with it a high risk of complications and morbidity.  The differential diagnosis includes UTI, multidrug-resistant organism, she is also hypotensive raising some concern for possible worsening infection or dehydration   Co morbidities that complicate the patient evaluation  Renal failure history   Additional history obtained:  Additional history obtained from medical record External records from outside source obtained and reviewed including labs from nephrology office   Lab Tests:  I Ordered, and personally interpreted labs.  The pertinent results include: BC and metabolic panel unremarkable and unchanged, she does have renal failure but at baseline, no leukocytosis, UA with lots of bacteria and white blood cells    Cardiac Monitoring: / EKG:  The patient was maintained on a cardiac monitor.  I personally viewed and interpreted the cardiac monitored which showed an underlying rhythm of: Sinus rhythm, initially hypotensive but responded to fluid   Consultations Obtained:  I requested consultation with the internal medicine hospitalist Dr. Jarvis Newcomer,  and discussed lab and imaging findings as well as pertinent plan - they recommend: Will discuss with infectious disease and see the patient in consultation to make recommendations of admission versus discharge   Problem List / ED Course / Critical interventions / Medication management  Ertapenem given for ESBL infection I have reviewed the patients  home medicines and have made adjustments as needed   Social Determinants of Health:  Elderly, expressive aphasia, nursing facility, assisted care   Test / Admission - Considered:  Possible admission, however after discussion with internal medicine and involving infectious disease Dr. Ilsa Iha they recommend a single dose of oral fosfomycin, this will be ordered and the patient can follow-up with nephrology         Final Clinical Impression(s) / ED Diagnoses Final diagnoses:  Acute cystitis without hematuria  Infection of drug-resistant bacteria  Chronic renal failure, unspecified CKD stage    Rx / DC Orders ED Discharge Orders     None         Eber Hong, MD 02/08/23 1412

## 2023-02-08 NOTE — ED Notes (Addendum)
Lewis Moccasin called, who is the POA to get an update. POA will attempt to send all paperwork today. Phone number [phone]

## 2023-02-08 NOTE — Discharge Instructions (Signed)
I have spoken with our infectious disease experts and they have recommended that the oral medication which you gave you here as a single dose is enough to treat the infection.  You will need to follow-up with your kidney doctor or your family doctor for a recheck of your urine within 1 week however if you develop severe or worsening symptoms including pain vomiting or fever return to the ER immediately

## 2023-02-08 NOTE — ED Notes (Signed)
Paperwork received from [facility] and handed to charge for proper delivery to have papers scanned into the patients chart.

## 2023-02-08 NOTE — Telephone Encounter (Signed)
Requested Prescriptions  Refused Prescriptions Disp Refills   PAIN RELIEF EXTRA STRENGTH 500 MG tablet [Pharmacy Med Name: ACETAMINOPHEN 500 MG TABLET] 90 tablet 0    Sig: TAKE (1) TABLET BY MOUTH EVERY SIX HOURS WHILE AWAKE.     Over the counter: OTC - acetaminophen Failed - 02/07/2023 12:09 PM      Failed - Cr in normal range and within 360 days    Creat  Date Value Ref Range Status  11/20/2022 2.60 (H) 0.60 - 0.95 mg/dL Final   Creatinine, Ser  Date Value Ref Range Status  02/08/2023 2.96 (H) 0.44 - 1.00 mg/dL Final   Creatinine, Urine  Date Value Ref Range Status  06/14/2022 122 20 - 275 mg/dL Final         Failed - Valid encounter within last 12 months    Recent Outpatient Visits           1 year ago Stage 3b chronic kidney disease (HCC)   [facility] Pickard, Priscille Heidelberg, MD   1 year ago UTI symptoms   [facility] Valentino Nose, NP   2 years ago Urinary tract infection without hematuria, site unspecified   [facility] Medicine Pickard, Priscille Heidelberg, MD   3 years ago CKD (chronic kidney disease) stage 4, GFR 15-29 ml/min (HCC)   [facility] Medicine Jenne Pane, Crystal A, FNP              Passed - ALT in normal range and within 360 days    ALT  Date Value Ref Range Status  02/08/2023 17 0 - 44 U/L Final         Passed - AST in normal range and within 360 days    AST  Date Value Ref Range Status  02/08/2023 17 15 - 41 U/L Final

## 2023-02-08 NOTE — Progress Notes (Signed)
Called by Dr. Hyacinth Meeker to consider admission for this 88yo F with outpatient diagnosis of ESBL E. coli UTI. She does not have evidence of sepsis, and so is a candidate for fosfomycin treatment. I have discussed with with ID, Dr. Drue Second who recommends one dose of fosfomycin 3g po to treat the infection. With this, I believe the patient can avoid admission at this time. Discussed with Dr. Hyacinth Meeker.   Hazeline Junker, MD 02/08/2023 2:02 PM

## 2023-02-09 DIAGNOSIS — R2689 Other abnormalities of gait and mobility: Secondary | ICD-10-CM | POA: Diagnosis not present

## 2023-02-09 LAB — URINE CULTURE: Culture: NO GROWTH

## 2023-02-13 ENCOUNTER — Other Ambulatory Visit: Payer: Self-pay | Admitting: Family Medicine

## 2023-02-13 DIAGNOSIS — M6281 Muscle weakness (generalized): Secondary | ICD-10-CM | POA: Diagnosis not present

## 2023-02-14 DIAGNOSIS — R2689 Other abnormalities of gait and mobility: Secondary | ICD-10-CM | POA: Diagnosis not present

## 2023-02-14 NOTE — Telephone Encounter (Signed)
Requested medication (s) are due for refill today: yes  Requested medication (s) are on the active medication list: yes  Last refill:  12/04/22 #60 5 RF   Future visit scheduled:no  Notes to clinic:  she is due but was previously refused/Cr out of range   Requested Prescriptions  Pending Prescriptions Disp Refills   PAIN RELIEF EXTRA STRENGTH 500 MG tablet [Pharmacy Med Name: ACETAMINOPHEN 500 MG TABLET] 90 tablet 0    Sig: TAKE (1) TABLET BY MOUTH EVERY SIX HOURS WHILE AWAKE.     Over the counter: OTC - acetaminophen Failed - 02/13/2023 10:26 AM      Failed - Cr in normal range and within 360 days    Creat  Date Value Ref Range Status  11/20/2022 2.60 (H) 0.60 - 0.95 mg/dL Final   Creatinine, Ser  Date Value Ref Range Status  02/08/2023 2.96 (H) 0.44 - 1.00 mg/dL Final   Creatinine, Urine  Date Value Ref Range Status  06/14/2022 122 20 - 275 mg/dL Final         Failed - Valid encounter within last 12 months    Recent Outpatient Visits           1 year ago Stage 3b chronic kidney disease (HCC)   [facility] Pickard, Priscille Heidelberg, MD   1 year ago UTI symptoms   [facility] Medicine Valentino Nose, NP   2 years ago Urinary tract infection without hematuria, site unspecified   [facility] Medicine Pickard, Priscille Heidelberg, MD   3 years ago CKD (chronic kidney disease) stage 4, GFR 15-29 ml/min (HCC)   [facility] Medicine Jenne Pane, Crystal A, FNP              Passed - ALT in normal range and within 360 days    ALT  Date Value Ref Range Status  02/08/2023 17 0 - 44 U/L Final         Passed - AST in normal range and within 360 days    AST  Date Value Ref Range Status  02/08/2023 17 15 - 41 U/L Final

## 2023-02-15 DIAGNOSIS — M6281 Muscle weakness (generalized): Secondary | ICD-10-CM | POA: Diagnosis not present

## 2023-02-19 DIAGNOSIS — R2689 Other abnormalities of gait and mobility: Secondary | ICD-10-CM | POA: Diagnosis not present

## 2023-02-20 DIAGNOSIS — M6281 Muscle weakness (generalized): Secondary | ICD-10-CM | POA: Diagnosis not present

## 2023-02-21 DIAGNOSIS — R2689 Other abnormalities of gait and mobility: Secondary | ICD-10-CM | POA: Diagnosis not present

## 2023-02-22 DIAGNOSIS — M6281 Muscle weakness (generalized): Secondary | ICD-10-CM | POA: Diagnosis not present

## 2023-02-23 DIAGNOSIS — R2689 Other abnormalities of gait and mobility: Secondary | ICD-10-CM | POA: Diagnosis not present

## 2023-02-27 DIAGNOSIS — M6281 Muscle weakness (generalized): Secondary | ICD-10-CM | POA: Diagnosis not present

## 2023-02-28 DIAGNOSIS — R2689 Other abnormalities of gait and mobility: Secondary | ICD-10-CM | POA: Diagnosis not present

## 2023-03-02 DIAGNOSIS — R2689 Other abnormalities of gait and mobility: Secondary | ICD-10-CM | POA: Diagnosis not present

## 2023-03-05 DIAGNOSIS — R2689 Other abnormalities of gait and mobility: Secondary | ICD-10-CM | POA: Diagnosis not present

## 2023-03-06 DIAGNOSIS — M6281 Muscle weakness (generalized): Secondary | ICD-10-CM | POA: Diagnosis not present

## 2023-03-07 DIAGNOSIS — R2689 Other abnormalities of gait and mobility: Secondary | ICD-10-CM | POA: Diagnosis not present

## 2023-03-08 DIAGNOSIS — M6281 Muscle weakness (generalized): Secondary | ICD-10-CM | POA: Diagnosis not present

## 2023-03-09 DIAGNOSIS — R2689 Other abnormalities of gait and mobility: Secondary | ICD-10-CM | POA: Diagnosis not present

## 2023-03-12 DIAGNOSIS — R2689 Other abnormalities of gait and mobility: Secondary | ICD-10-CM | POA: Diagnosis not present

## 2023-03-13 DIAGNOSIS — M6281 Muscle weakness (generalized): Secondary | ICD-10-CM | POA: Diagnosis not present

## 2023-03-14 DIAGNOSIS — R2689 Other abnormalities of gait and mobility: Secondary | ICD-10-CM | POA: Diagnosis not present

## 2023-03-15 DIAGNOSIS — M6281 Muscle weakness (generalized): Secondary | ICD-10-CM | POA: Diagnosis not present

## 2023-03-19 DIAGNOSIS — R2689 Other abnormalities of gait and mobility: Secondary | ICD-10-CM | POA: Diagnosis not present

## 2023-03-20 DIAGNOSIS — M6281 Muscle weakness (generalized): Secondary | ICD-10-CM | POA: Diagnosis not present

## 2023-03-21 DIAGNOSIS — R2689 Other abnormalities of gait and mobility: Secondary | ICD-10-CM | POA: Diagnosis not present

## 2023-03-22 DIAGNOSIS — M6281 Muscle weakness (generalized): Secondary | ICD-10-CM | POA: Diagnosis not present

## 2023-03-27 DIAGNOSIS — M6281 Muscle weakness (generalized): Secondary | ICD-10-CM | POA: Diagnosis not present

## 2023-03-28 DIAGNOSIS — R2689 Other abnormalities of gait and mobility: Secondary | ICD-10-CM | POA: Diagnosis not present

## 2023-03-29 DIAGNOSIS — M6281 Muscle weakness (generalized): Secondary | ICD-10-CM | POA: Diagnosis not present

## 2023-04-02 DIAGNOSIS — M6281 Muscle weakness (generalized): Secondary | ICD-10-CM | POA: Diagnosis not present

## 2023-04-03 DIAGNOSIS — M6281 Muscle weakness (generalized): Secondary | ICD-10-CM | POA: Diagnosis not present

## 2023-04-06 DIAGNOSIS — R2689 Other abnormalities of gait and mobility: Secondary | ICD-10-CM | POA: Diagnosis not present

## 2023-04-09 DIAGNOSIS — R2689 Other abnormalities of gait and mobility: Secondary | ICD-10-CM | POA: Diagnosis not present

## 2023-04-11 DIAGNOSIS — N189 Chronic kidney disease, unspecified: Secondary | ICD-10-CM | POA: Diagnosis not present

## 2023-04-11 DIAGNOSIS — N179 Acute kidney failure, unspecified: Secondary | ICD-10-CM | POA: Diagnosis not present

## 2023-04-11 DIAGNOSIS — N184 Chronic kidney disease, stage 4 (severe): Secondary | ICD-10-CM | POA: Diagnosis not present

## 2023-04-11 DIAGNOSIS — N2581 Secondary hyperparathyroidism of renal origin: Secondary | ICD-10-CM | POA: Diagnosis not present

## 2023-04-11 DIAGNOSIS — I129 Hypertensive chronic kidney disease with stage 1 through stage 4 chronic kidney disease, or unspecified chronic kidney disease: Secondary | ICD-10-CM | POA: Diagnosis not present

## 2023-04-11 DIAGNOSIS — N390 Urinary tract infection, site not specified: Secondary | ICD-10-CM | POA: Diagnosis not present

## 2023-04-11 DIAGNOSIS — R809 Proteinuria, unspecified: Secondary | ICD-10-CM | POA: Diagnosis not present

## 2023-04-18 DIAGNOSIS — R809 Proteinuria, unspecified: Secondary | ICD-10-CM | POA: Diagnosis not present

## 2023-04-18 DIAGNOSIS — N184 Chronic kidney disease, stage 4 (severe): Secondary | ICD-10-CM | POA: Diagnosis not present

## 2023-04-18 DIAGNOSIS — I129 Hypertensive chronic kidney disease with stage 1 through stage 4 chronic kidney disease, or unspecified chronic kidney disease: Secondary | ICD-10-CM | POA: Diagnosis not present

## 2023-04-18 DIAGNOSIS — N179 Acute kidney failure, unspecified: Secondary | ICD-10-CM | POA: Diagnosis not present

## 2023-05-04 ENCOUNTER — Other Ambulatory Visit: Payer: Self-pay

## 2023-05-04 ENCOUNTER — Emergency Department (HOSPITAL_COMMUNITY): Payer: PPO

## 2023-05-04 ENCOUNTER — Encounter (HOSPITAL_COMMUNITY): Payer: Self-pay | Admitting: Emergency Medicine

## 2023-05-04 ENCOUNTER — Emergency Department (HOSPITAL_COMMUNITY)
Admission: EM | Admit: 2023-05-04 | Discharge: 2023-05-04 | Payer: PPO | Attending: Emergency Medicine | Admitting: Emergency Medicine

## 2023-05-04 DIAGNOSIS — I10 Essential (primary) hypertension: Secondary | ICD-10-CM | POA: Insufficient documentation

## 2023-05-04 DIAGNOSIS — F0390 Unspecified dementia without behavioral disturbance: Secondary | ICD-10-CM | POA: Diagnosis not present

## 2023-05-04 DIAGNOSIS — S51812A Laceration without foreign body of left forearm, initial encounter: Secondary | ICD-10-CM | POA: Insufficient documentation

## 2023-05-04 DIAGNOSIS — W1830XA Fall on same level, unspecified, initial encounter: Secondary | ICD-10-CM | POA: Diagnosis not present

## 2023-05-04 DIAGNOSIS — W19XXXA Unspecified fall, initial encounter: Secondary | ICD-10-CM

## 2023-05-04 DIAGNOSIS — Z79899 Other long term (current) drug therapy: Secondary | ICD-10-CM | POA: Insufficient documentation

## 2023-05-04 DIAGNOSIS — M25552 Pain in left hip: Secondary | ICD-10-CM | POA: Diagnosis not present

## 2023-05-04 DIAGNOSIS — Z7902 Long term (current) use of antithrombotics/antiplatelets: Secondary | ICD-10-CM | POA: Diagnosis not present

## 2023-05-04 DIAGNOSIS — R58 Hemorrhage, not elsewhere classified: Secondary | ICD-10-CM | POA: Diagnosis not present

## 2023-05-04 DIAGNOSIS — S59912A Unspecified injury of left forearm, initial encounter: Secondary | ICD-10-CM | POA: Diagnosis present

## 2023-05-04 DIAGNOSIS — Z87891 Personal history of nicotine dependence: Secondary | ICD-10-CM | POA: Diagnosis not present

## 2023-05-04 DIAGNOSIS — S199XXA Unspecified injury of neck, initial encounter: Secondary | ICD-10-CM | POA: Diagnosis not present

## 2023-05-04 DIAGNOSIS — M8580 Other specified disorders of bone density and structure, unspecified site: Secondary | ICD-10-CM | POA: Diagnosis not present

## 2023-05-04 DIAGNOSIS — M25551 Pain in right hip: Secondary | ICD-10-CM | POA: Diagnosis not present

## 2023-05-04 DIAGNOSIS — M47816 Spondylosis without myelopathy or radiculopathy, lumbar region: Secondary | ICD-10-CM | POA: Diagnosis not present

## 2023-05-04 DIAGNOSIS — G9389 Other specified disorders of brain: Secondary | ICD-10-CM | POA: Diagnosis not present

## 2023-05-04 DIAGNOSIS — S0990XA Unspecified injury of head, initial encounter: Secondary | ICD-10-CM | POA: Diagnosis not present

## 2023-05-04 NOTE — ED Triage Notes (Signed)
Pt arrived via [facility] from [facility] c/o a fall, denies hitting head, denies LOC, unsure if on blood thinners per EMS but does have a Hx of A fib. Skin tear to L forearm

## 2023-05-04 NOTE — ED Notes (Signed)
Pt rolled into bathroom using a wheel chair.  Bandage applied to L forearm.  RN notified.

## 2023-05-04 NOTE — ED Notes (Signed)
Per EMS, pt was brought with a blanket and per [facility], pt "its her special blanket make sure she comes back with it when she is discharged"

## 2023-05-04 NOTE — ED Notes (Signed)
Pt had a BM, pt cleaned up, peri care performed, brief changed and items placed in bag with pt label on it

## 2023-05-04 NOTE — ED Notes (Signed)
Abrasion noted to L thigh and arm. Bruise noted to R elbow

## 2023-05-04 NOTE — ED Provider Notes (Addendum)
[place] EMERGENCY DEPARTMENT AT [facility] Provider Note   CSN: [id number] Arrival date & time: 05/04/23  0745     History  Chief Complaint  Patient presents with   [name] is a 88 y.o. female.  [facility] sent in [facility].  Patient brought in for fall patient denies any head trauma.  But she does have a history of dementia.  Patient is on Plavix does not seem to be on a significant blood thinner.  Patient has a skin tear to her left forearm which is fairly large.  So anterior part of the forearm.  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  History of urinary tract infections.  Patient is a former smoker quit 1985.       Home Medications Prior to Admission medications   Medication Sig Start Date End Date Taking? Authorizing Provider  acetaminophen (TYLENOL) 500 MG tablet Take 1 tablet (500 mg total) by mouth every 6 (six) hours as needed. 12/04/22   Donita Brooks, MD  carvedilol (COREG) 25 MG tablet TAKE (1) TABLET BY MOUTH TWICE DAILY. Patient taking differently: Take 25 mg by mouth 2 (two) times daily with a meal. 08/31/22   Donita Brooks, MD  clopidogrel (PLAVIX) 75 MG tablet TAKE (1) TABLET BY MOUTH ONCE DAILY. Patient taking differently: Take 75 mg by mouth daily. 08/31/22   Donita Brooks, MD  diclofenac Sodium (VOLTAREN) 1 % GEL Apply topically. 12/12/22   [provider]  dorzolamide-timolol (COSOPT) 22.3-6.8 MG/ML ophthalmic solution Place 1 drop into both eyes 2 (two) times daily. 11/25/20   [provider]  hydrALAZINE (APRESOLINE) 50 MG tablet Take 50 mg by mouth 3 (three) times daily. 12/26/22   [provider]  pantoprazole (PROTONIX) 40 MG tablet TAKE (1) TABLET BY MOUTH ONCE  DAILY. Patient taking differently: Take 40 mg by mouth daily. 08/31/22   Donita Brooks, MD  ROCKLATAN 0.02-0.005 % SOLN Apply 1 drop to eye daily. 11/25/20   [provider]  rosuvastatin (CRESTOR) 10 MG tablet Take 10 mg by mouth at bedtime. 12/28/22   [provider]  senna (SENOKOT) 8.6 MG tablet Take 1 tablet by mouth at bedtime.     [provider]  traMADol (ULTRAM) 50 MG tablet Take 50 mg by mouth every 6 (six) hours as needed. 12/20/22   [provider]      Allergies    Patient has no known allergies.    Review of Systems   Review of Systems  Unable to perform ROS: Dementia  Gastrointestinal:  Negative for vomiting.  Skin:  Negative for color change and rash.    Physical Exam Updated Vital Signs BP (!) 217/86   Pulse 65   Temp 98 F (36.7 C)   Resp 18   SpO2 97%  Physical Exam Vitals and nursing note reviewed.  Constitutional:      General: She is not in acute distress.    Appearance: Normal appearance. She is well-developed.  HENT:     Head: Normocephalic and atraumatic.  Eyes:     Extraocular Movements: Extraocular movements intact.     Conjunctiva/sclera: Conjunctivae normal.     Pupils: Pupils are equal, round, and reactive to light.  Cardiovascular:     Rate  and Rhythm: Normal rate and regular rhythm.     Heart sounds: No murmur heard. Pulmonary:     Effort: Pulmonary effort is normal. No respiratory distress.     Breath sounds: Normal breath sounds.  Abdominal:     Palpations: Abdomen is soft.     Tenderness: There is no abdominal tenderness.  Musculoskeletal:        General: Signs of injury present. No swelling.     Cervical back: Neck supple. No rigidity.     Comments: Large skin tear to left anterior forearm measuring about 5 cm.  Not through the dermis.  Skin:    General: Skin is warm and dry.     Capillary Refill: Capillary refill takes less than 2 seconds.  Neurological:     Mental Status: She is alert.  Mental status is at baseline.     Comments: Patient is alert.  Psychiatric:        Mood and Affect: Mood normal.     ED Results / Procedures / Treatments   Labs (all labs ordered are listed, but only abnormal results are displayed) Labs Reviewed - No data to display  EKG None  Radiology No results found.  Procedures Procedures    Medications Ordered in ED Medications - No data to display  ED Course/ Medical Decision Making/ A&P                                 Medical Decision Making Amount and/or Complexity of Data Reviewed Radiology: ordered.   Patient sent in from nursing facility status post fall.  Patient is on Plavix.  Will go ahead CT head neck and get x-rays of pelvis and both hips.  Patient does have a large skin tear to the left anterior forearm measuring about 5 cm.  No skin to draped back over that.  Antibiotic ointment nonadherent dressing and wound care will be appropriate for the healing of that.  CT head and neck without any acute findings.  CT neck shows evidence of the chronic left MCA infarct which is responsible for some of her right sided weakness.  CT neck without any acute findings.  X-ray of both hips and pelvis without any acute findings.  Patient stable to go back to nursing facility.  Wound care for the skin tears.   Final Clinical Impression(s) / ED Diagnoses Final diagnoses:  Fall, initial encounter  Skin tear of left forearm without complication, initial encounter    Rx / DC Orders ED Discharge Orders     None         Vanetta Mulders, MD 05/04/23 1610    Vanetta Mulders, MD 05/04/23 1101

## 2023-05-04 NOTE — Discharge Instructions (Signed)
CT head and neck and x-rays of both hips and pelvis without any acute abnormalities.  For the left arm skin tear recommend daily dressing changes antibiotic ointment applied and nonadherent dressing.  This will heal on its own.

## 2023-05-04 NOTE — ED Notes (Signed)
MD made aware of high BP of 231/75

## 2023-05-09 DIAGNOSIS — M6281 Muscle weakness (generalized): Secondary | ICD-10-CM | POA: Diagnosis not present

## 2023-05-17 DIAGNOSIS — M6281 Muscle weakness (generalized): Secondary | ICD-10-CM | POA: Diagnosis not present

## 2023-05-23 DIAGNOSIS — M6281 Muscle weakness (generalized): Secondary | ICD-10-CM | POA: Diagnosis not present

## 2023-05-28 DIAGNOSIS — M6281 Muscle weakness (generalized): Secondary | ICD-10-CM | POA: Diagnosis not present

## 2023-06-04 DIAGNOSIS — M6281 Muscle weakness (generalized): Secondary | ICD-10-CM | POA: Diagnosis not present

## 2023-06-11 ENCOUNTER — Telehealth: Payer: Self-pay

## 2023-06-11 ENCOUNTER — Other Ambulatory Visit: Payer: Self-pay

## 2023-06-11 DIAGNOSIS — R0981 Nasal congestion: Secondary | ICD-10-CM

## 2023-06-11 MED ORDER — DM-GUAIFENESIN ER 30-600 MG PO TB12: 1.0000 | ORAL_TABLET | Freq: Two times a day (BID) | ORAL | 1 refills | Status: AC

## 2023-06-11 NOTE — Telephone Encounter (Signed)
 Nettie Elm, RN with [facility] has called stating patient is c/o a sore throat and congestion. Can we send over an order for Coricidin? Thanks!

## 2023-06-13 DIAGNOSIS — M6281 Muscle weakness (generalized): Secondary | ICD-10-CM | POA: Diagnosis not present

## 2023-06-18 ENCOUNTER — Other Ambulatory Visit: Payer: Self-pay | Admitting: Family Medicine

## 2023-06-19 NOTE — Telephone Encounter (Signed)
 Requested medication (s) are due for refill today: Yes  Requested medication (s) are on the active medication list: Yes  Last refill:  01/04/23  Future visit scheduled: No  Notes to clinic:  Historical provider, not delegated.    Requested Prescriptions  Pending Prescriptions Disp Refills   traMADol (ULTRAM) 50 MG tablet [Pharmacy Med Name: TRAMADOL HCL 50 MG TABLET] 28 tablet 0    Sig: TAKE (1) TABLET BY MOUTH EVERY SIX HOURS AS NEEDED FOR BREAKTHROUGH PAIN.     Not Delegated - Analgesics:  Opioid Agonists Failed - 06/19/2023  3:40 PM      Failed - This refill cannot be delegated      Failed - Urine Drug Screen completed in last 360 days      Failed - Valid encounter within last 3 months    Recent Outpatient Visits           1 year ago Stage 3b chronic kidney disease (HCC)   [facility] Pickard, Butler DASEN, MD   2 years ago UTI symptoms   [facility] Chandra Harlene LABOR, NP   2 years ago Urinary tract infection without hematuria, site unspecified   [facility] Medicine Pickard, Butler DASEN, MD   3 years ago CKD (chronic kidney disease) stage 4, GFR 15-29 ml/min (HCC)   [facility] Medicine Carlie Camelia LABOR, FNP

## 2023-06-21 DIAGNOSIS — M6281 Muscle weakness (generalized): Secondary | ICD-10-CM | POA: Diagnosis not present

## 2023-06-29 DIAGNOSIS — M6281 Muscle weakness (generalized): Secondary | ICD-10-CM | POA: Diagnosis not present

## 2023-07-05 DIAGNOSIS — D631 Anemia in chronic kidney disease: Secondary | ICD-10-CM | POA: Diagnosis not present

## 2023-07-05 DIAGNOSIS — N189 Chronic kidney disease, unspecified: Secondary | ICD-10-CM | POA: Diagnosis not present

## 2023-07-05 DIAGNOSIS — R809 Proteinuria, unspecified: Secondary | ICD-10-CM | POA: Diagnosis not present

## 2023-07-11 DIAGNOSIS — I129 Hypertensive chronic kidney disease with stage 1 through stage 4 chronic kidney disease, or unspecified chronic kidney disease: Secondary | ICD-10-CM | POA: Diagnosis not present

## 2023-07-11 DIAGNOSIS — R809 Proteinuria, unspecified: Secondary | ICD-10-CM | POA: Diagnosis not present

## 2023-07-11 DIAGNOSIS — N2581 Secondary hyperparathyroidism of renal origin: Secondary | ICD-10-CM | POA: Diagnosis not present

## 2023-07-11 DIAGNOSIS — N184 Chronic kidney disease, stage 4 (severe): Secondary | ICD-10-CM | POA: Diagnosis not present

## 2023-07-12 ENCOUNTER — Telehealth: Payer: Self-pay | Admitting: Family Medicine

## 2023-07-12 NOTE — Telephone Encounter (Signed)
 [facility] dropped off FL2 for provider to complete and sign. Requesting call when forms completed and ready for pickup at [phone].  Form placed on desk of provider's nurse.

## 2023-07-30 DIAGNOSIS — N184 Chronic kidney disease, stage 4 (severe): Secondary | ICD-10-CM | POA: Diagnosis not present

## 2023-08-06 ENCOUNTER — Other Ambulatory Visit: Payer: PPO

## 2023-08-06 DIAGNOSIS — Z8673 Personal history of transient ischemic attack (TIA), and cerebral infarction without residual deficits: Secondary | ICD-10-CM

## 2023-08-06 DIAGNOSIS — I10 Essential (primary) hypertension: Secondary | ICD-10-CM | POA: Diagnosis not present

## 2023-08-06 DIAGNOSIS — E782 Mixed hyperlipidemia: Secondary | ICD-10-CM

## 2023-08-07 LAB — LIPID PANEL
Cholesterol: 146 mg/dL (ref <200)
HDL: 42 mg/dL — ABNORMAL LOW (ref >=50)
LDL Cholesterol (Calc): 81 mg/dL
Non-HDL Cholesterol (Calc): 104 mg/dL (ref <130)
Total CHOL/HDL Ratio: 3.5 (calc) (ref <5.0)
Triglycerides: 134 mg/dL (ref <150)

## 2023-08-07 LAB — CBC WITH DIFFERENTIAL/PLATELET
Absolute Lymphocytes: 1221 {cells}/uL (ref 850–3900)
Absolute Monocytes: 577 {cells}/uL (ref 200–950)
Basophils Absolute: 78 {cells}/uL (ref 0–200)
Basophils Relative: 1.4 %
Eosinophils Absolute: 246 {cells}/uL (ref 15–500)
Eosinophils Relative: 4.4 %
HCT: 35 % (ref 35.0–45.0)
Hemoglobin: 10.4 g/dL — ABNORMAL LOW (ref 11.7–15.5)
MCH: 26.2 pg — ABNORMAL LOW (ref 27.0–33.0)
MCHC: 29.7 g/dL — ABNORMAL LOW (ref 32.0–36.0)
MCV: 88.2 fL (ref 80.0–100.0)
MPV: 10.5 fL (ref 7.5–12.5)
Monocytes Relative: 10.3 %
Neutro Abs: 3478 {cells}/uL (ref 1500–7800)
Neutrophils Relative %: 62.1 %
Platelets: 227 10*3/uL (ref 140–400)
RBC: 3.97 10*6/uL (ref 3.80–5.10)
RDW: 11.8 % (ref 11.0–15.0)
Total Lymphocyte: 21.8 %
WBC: 5.6 10*3/uL (ref 3.8–10.8)

## 2023-08-07 LAB — COMPREHENSIVE METABOLIC PANEL
AG Ratio: 1.5 (calc) (ref 1.0–2.5)
ALT: 9 U/L (ref 6–29)
AST: 14 U/L (ref 10–35)
Albumin: 3.7 g/dL (ref 3.6–5.1)
Alkaline phosphatase (APISO): 90 U/L (ref 37–153)
BUN/Creatinine Ratio: 12 (calc) (ref 6–22)
BUN: 30 mg/dL — ABNORMAL HIGH (ref 7–25)
CO2: 24 mmol/L (ref 20–32)
Calcium: 8.6 mg/dL (ref 8.6–10.4)
Chloride: 108 mmol/L (ref 98–110)
Creat: 2.5 mg/dL — ABNORMAL HIGH (ref 0.60–0.95)
Globulin: 2.4 g/dL (ref 1.9–3.7)
Glucose, Bld: 134 mg/dL — ABNORMAL HIGH (ref 65–99)
Potassium: 4.5 mmol/L (ref 3.5–5.3)
Sodium: 143 mmol/L (ref 135–146)
Total Bilirubin: 0.5 mg/dL (ref 0.2–1.2)
Total Protein: 6.1 g/dL (ref 6.1–8.1)

## 2023-08-09 DIAGNOSIS — N184 Chronic kidney disease, stage 4 (severe): Secondary | ICD-10-CM | POA: Diagnosis not present

## 2023-08-09 DIAGNOSIS — I129 Hypertensive chronic kidney disease with stage 1 through stage 4 chronic kidney disease, or unspecified chronic kidney disease: Secondary | ICD-10-CM | POA: Diagnosis not present

## 2023-08-09 DIAGNOSIS — N2581 Secondary hyperparathyroidism of renal origin: Secondary | ICD-10-CM | POA: Diagnosis not present

## 2023-08-09 DIAGNOSIS — R809 Proteinuria, unspecified: Secondary | ICD-10-CM | POA: Diagnosis not present

## 2023-08-23 ENCOUNTER — Ambulatory Visit (INDEPENDENT_AMBULATORY_CARE_PROVIDER_SITE_OTHER): Admitting: Family Medicine

## 2023-08-23 ENCOUNTER — Encounter: Payer: Self-pay | Admitting: Family Medicine

## 2023-08-23 VITALS — BP 152/76 | HR 65 | Temp 97.6°F | Ht 62.0 in | Wt 138.8 lb

## 2023-08-23 DIAGNOSIS — N184 Chronic kidney disease, stage 4 (severe): Secondary | ICD-10-CM

## 2023-08-23 DIAGNOSIS — I129 Hypertensive chronic kidney disease with stage 1 through stage 4 chronic kidney disease, or unspecified chronic kidney disease: Secondary | ICD-10-CM

## 2023-08-23 DIAGNOSIS — Z8673 Personal history of transient ischemic attack (TIA), and cerebral infarction without residual deficits: Secondary | ICD-10-CM

## 2023-08-23 DIAGNOSIS — I10 Essential (primary) hypertension: Secondary | ICD-10-CM | POA: Diagnosis not present

## 2023-08-23 MED ORDER — AMLODIPINE BESYLATE 10 MG PO TABS: 10.0000 mg | ORAL_TABLET | Freq: Every day | ORAL | 3 refills | Status: DC

## 2023-08-23 NOTE — Progress Notes (Signed)
 Subjective:    Patient ID: [name], female    DOB: [birth year], 89 y.o.   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  She also has a history of hypertension.  The patient is now in a skilled nursing facility.  Patient is currently on valsartan 40 mg a day carvedilol 25 mg twice daily, and hydralazine 50 mg 3 times a day.  Yet her blood pressure at the nursing facility is between 150 and 170 systolic.  It is elevated here today as well.  The patient denies any chest pain shortness of breath or dyspnea on exertion.  She denies any abdominal pain melena or hematochezia.  She denies any dysuria. 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)   Past Surgical History:  Procedure Laterality Date   APPENDECTOMY     ESOPHAGEAL DILATION N/A 07/11/2018   Procedure: ESOPHAGEAL DILATION;  Surgeon: Malissa Hippo, MD;  Location: [facility];  Service: Endoscopy;  Laterality: N/A;   ESOPHAGOGASTRODUODENOSCOPY N/A 04/28/2018   Procedure: ESOPHAGOGASTRODUODENOSCOPY (EGD);  Surgeon: Malissa Hippo, MD;  Location: [facility];  Service: Endoscopy;  Laterality: N/A;   ESOPHAGOGASTRODUODENOSCOPY N/A 07/11/2018   Procedure: ESOPHAGOGASTRODUODENOSCOPY (EGD);  Surgeon: Malissa Hippo, MD;  Location: [facility];  Service: Endoscopy;  Laterality: N/A;  1200   HARDWARE REMOVAL Right 10/05/2014   Procedure: HARDWARE REMOVAL;  Surgeon: Jodi Geralds, MD;  Location: [facility] OR;  Service: Orthopedics;  Laterality:  Right;   INTRAMEDULLARY (IM) NAIL INTERTROCHANTERIC Right 06/12/2014   Procedure: INTRAMEDULLARY (IM) NAIL INTERTROCHANTRIC RIGHT HIP;  Surgeon: Harvie Junior, MD;  Location: [facility] OR;  Service: Orthopedics;  Laterality: Right;   kidney stones     LITHOTRIPSY     PEG PLACEMENT     and removal   PEG TUBE REMOVAL     RADIOLOGY WITH ANESTHESIA  04/22/2012   Procedure: RADIOLOGY WITH ANESTHESIA;  Surgeon: Oneal Grout, MD;  Location: [facility] OR;  Service: Radiology;  Laterality: N/A;  VERTEBRAL ARTERY STENT PLACEMENT AND CEREBRAL ANGIOGRAM   TONSILLECTOMY     TYMPANOPLASTY     Current Outpatient Medications on File Prior to Visit  Medication Sig Dispense Refill   acetaminophen (TYLENOL) 500 MG tablet Take 500 mg by mouth every 6 (six) hours. Every 6 hours while awake.     calcitRIOL (ROCALTROL) 0.25 MCG capsule Take 0.25 mcg by mouth every Monday, Wednesday, and Friday.     carvedilol (COREG) 25 MG tablet TAKE (1) TABLET BY MOUTH TWICE DAILY. 180 tablet 1   clopidogrel (PLAVIX) 75 MG tablet TAKE (1) TABLET BY MOUTH ONCE DAILY. 90 tablet 1   dextromethorphan-guaiFENesin (MUCINEX DM) 30-600 MG 12hr tablet Take 1 tablet by mouth 2 (two) times daily. 60 tablet 1   diclofenac Sodium (VOLTAREN) 1 % GEL Apply 4 g topically 4 (four) times daily as needed (Affected knee for pain).  dorzolamide-timolol (COSOPT) 22.3-6.8 MG/ML ophthalmic solution Place 1 drop into both eyes 2 (two) times daily.     fosfomycin (MONUROL) 3 g PACK Take 3 g by mouth See admin instructions. Dissolve 1 packet into 3-4 ounces of water & drink every 3 days for 3 doses. (Patient not taking: Reported on 05/04/2023)     hydrALAZINE (APRESOLINE) 50 MG tablet Take 50 mg by mouth 3 (three) times daily.     pantoprazole (PROTONIX) 40 MG tablet TAKE (1) TABLET BY MOUTH ONCE DAILY. 90 tablet 2   ROCKLATAN 0.02-0.005 % SOLN Place 1 drop into both eyes daily.     rosuvastatin (CRESTOR) 10 MG tablet Take 10 mg by mouth at bedtime.     senna  (SENOKOT) 8.6 MG tablet Take 1 tablet by mouth at bedtime as needed for constipation.     traMADol (ULTRAM) 50 MG tablet Take 50 mg by mouth every 6 (six) hours as needed for moderate pain (pain score 4-6).     No current facility-administered medications on file prior to visit.   No Known Allergies  Social History   Socioeconomic History   Marital status: Widowed    Spouse name: Onalee Hua   Number of children: 0   Years of education: Not on file   Highest education level: Not on file  Occupational History    Comment: Retired Charity fundraiser  Tobacco Use   Smoking status: Former    Current packs/day: 0.00    Types: Cigarettes    Quit date: 10/02/1983    Years since quitting: 39.9   Smokeless tobacco: Never  Vaping Use   Vaping status: Never Used  Substance and Sexual Activity   Alcohol use: Not Currently   Drug use: No   Sexual activity: Not Currently  Other Topics Concern   Not on file  Social History Narrative   Resident at [facility]    2 step children.   Social Drivers of Corporate investment banker Strain: Low Risk  (01/04/2023)   Overall Financial Resource Strain (CARDIA)    Difficulty of Paying Living Expenses: Not hard at all  Food Insecurity: No Food Insecurity (01/04/2023)   Hunger Vital Sign    Worried About Running Out of Food in the Last Year: Never true    Ran Out of Food in the Last Year: Never true  Transportation Needs: No Transportation Needs (01/04/2023)   PRAPARE - Administrator, Civil Service (Medical): No    Lack of Transportation (Non-Medical): No  Physical Activity: Inactive (01/04/2023)   Exercise Vital Sign    Days of Exercise per Week: 0 days    Minutes of Exercise per Session: 0 min  Stress: No Stress Concern Present (01/04/2023)   [employer] of Occupational Health - Occupational Stress Questionnaire    Feeling of Stress : Not at all  Social Connections: Socially Isolated (01/04/2023)   Social Connection and Isolation Panel  [NHANES]    Frequency of Communication with Friends and Family: More than three times a week    Frequency of Social Gatherings with Friends and Family: Three times a week    Attends Religious Services: Never    Active Member of Clubs or Organizations: No    Attends Banker Meetings: Never    Marital Status: Widowed  Intimate Partner Violence: Not At Risk (01/04/2023)   Humiliation, Afraid, Rape, and Kick questionnaire    Fear of Current or Ex-Partner: No    Emotionally Abused: No  Physically Abused: No    Sexually Abused: No     Review of Systems  All other systems reviewed and are negative.      Objective:   Physical Exam Vitals reviewed.  Constitutional:      General: She is not in acute distress.    Appearance: Normal appearance. She is normal weight. She is not ill-appearing or toxic-appearing.  Cardiovascular:     Rate and Rhythm: Normal rate and regular rhythm.     Pulses: Normal pulses.     Heart sounds: Normal heart sounds. No murmur heard.    No friction rub. No gallop.  Pulmonary:     Effort: Pulmonary effort is normal. No respiratory distress.     Breath sounds: Normal breath sounds. No stridor. No wheezing, rhonchi or rales.  Abdominal:     General: Abdomen is flat. Bowel sounds are normal.     Palpations: Abdomen is soft.  Musculoskeletal:     Right lower leg: No edema.     Left lower leg: No edema.  Neurological:     Mental Status: She is alert.           Assessment & Plan:  History of CVA (cerebrovascular accident) - Plan: CBC with Differential/Platelet, COMPLETE METABOLIC PANEL WITH GFR, Lipid panel, Protein / Creatinine Ratio, Urine, Iron, TIBC and Ferritin Panel  Essential hypertension - Plan: CBC with Differential/Platelet, COMPLETE METABOLIC PANEL WITH GFR, Lipid panel, Protein / Creatinine Ratio, Urine, Iron, TIBC and Ferritin Panel  Benign hypertension with CKD (chronic kidney disease) stage IV (HCC) - Plan: CBC with  Differential/Platelet, COMPLETE METABOLIC PANEL WITH GFR, Lipid panel, Protein / Creatinine Ratio, Urine, Iron, TIBC and Ferritin Panel Really want to focus on getting her blood pressure under better control.  Add amlodipine 10 mg a day and recheck blood pressure weekly at the nursing facility.  I have asked them to notify us if her blood pressures greater than 150/90.  If consistently greater than 150/90 potentially could increase hydralazine or valsartan dosage.  Meanwhile check CBC CMP lipid panel urine protein creatinine ratio iron and ferritin.  I reviewed her nephrologist recent note and her hemoglobin was 10.8.  I want to evaluate if she has iron deficiency or whether this could be a result of her chronic kidney disease.

## 2023-08-24 ENCOUNTER — Telehealth: Payer: Self-pay

## 2023-08-24 LAB — CBC WITH DIFFERENTIAL/PLATELET
Absolute Lymphocytes: 717 {cells}/uL — ABNORMAL LOW (ref 850–3900)
Absolute Monocytes: 590 {cells}/uL (ref 200–950)
Basophils Absolute: 87 {cells}/uL (ref 0–200)
Basophils Relative: 1.3 %
Eosinophils Absolute: 241 {cells}/uL (ref 15–500)
Eosinophils Relative: 3.6 %
HCT: 35.1 % (ref 35.0–45.0)
Hemoglobin: 10.9 g/dL — ABNORMAL LOW (ref 11.7–15.5)
MCH: 26.8 pg — ABNORMAL LOW (ref 27.0–33.0)
MCHC: 31.1 g/dL — ABNORMAL LOW (ref 32.0–36.0)
MCV: 86.2 fL (ref 80.0–100.0)
MPV: 10.3 fL (ref 7.5–12.5)
Monocytes Relative: 8.8 %
Neutro Abs: 5065 {cells}/uL (ref 1500–7800)
Neutrophils Relative %: 75.6 %
Platelets: 243 10*3/uL (ref 140–400)
RBC: 4.07 10*6/uL (ref 3.80–5.10)
RDW: 11.7 % (ref 11.0–15.0)
Total Lymphocyte: 10.7 %
WBC: 6.7 10*3/uL (ref 3.8–10.8)

## 2023-08-24 LAB — IRON,TIBC AND FERRITIN PANEL
%SAT: 27 % (ref 16–45)
Ferritin: 61 ng/mL (ref 16–288)
Iron: 70 ug/dL (ref 45–160)
TIBC: 263 ug/dL (ref 250–450)

## 2023-08-24 LAB — PROTEIN / CREATININE RATIO, URINE
Creatinine, Urine: 54 mg/dL (ref 20–275)
Protein/Creat Ratio: 463 mg/g{creat} — ABNORMAL HIGH (ref 24–184)
Protein/Creatinine Ratio: 0.463 mg/mg{creat} — ABNORMAL HIGH (ref 0.024–0.184)
Total Protein, Urine: 25 mg/dL — ABNORMAL HIGH (ref 5–24)

## 2023-08-24 LAB — COMPLETE METABOLIC PANEL WITH GFR
AG Ratio: 1.5 (calc) (ref 1.0–2.5)
ALT: 7 U/L (ref 6–29)
AST: 12 U/L (ref 10–35)
Albumin: 3.7 g/dL (ref 3.6–5.1)
Alkaline phosphatase (APISO): 95 U/L (ref 37–153)
BUN/Creatinine Ratio: 13 (calc) (ref 6–22)
BUN: 31 mg/dL — ABNORMAL HIGH (ref 7–25)
CO2: 26 mmol/L (ref 20–32)
Calcium: 8.9 mg/dL (ref 8.6–10.4)
Chloride: 106 mmol/L (ref 98–110)
Creat: 2.42 mg/dL — ABNORMAL HIGH (ref 0.60–0.95)
Globulin: 2.4 g/dL (ref 1.9–3.7)
Glucose, Bld: 101 mg/dL — ABNORMAL HIGH (ref 65–99)
Potassium: 4.4 mmol/L (ref 3.5–5.3)
Sodium: 140 mmol/L (ref 135–146)
Total Bilirubin: 0.6 mg/dL (ref 0.2–1.2)
Total Protein: 6.1 g/dL (ref 6.1–8.1)

## 2023-08-24 LAB — LIPID PANEL
Cholesterol: 141 mg/dL (ref <200)
HDL: 39 mg/dL — ABNORMAL LOW (ref >=50)
LDL Cholesterol (Calc): 77 mg/dL
Non-HDL Cholesterol (Calc): 102 mg/dL (ref <130)
Total CHOL/HDL Ratio: 3.6 (calc) (ref <5.0)
Triglycerides: 145 mg/dL (ref <150)

## 2023-08-24 NOTE — Telephone Encounter (Signed)
 Copied from CRM [id number]. Topic: General - Call Back - No Documentation >> Aug 24, 2023  9:40 AM Higinio Roger wrote: Reason for CRM: Fleet Contras from [facility] states that there is an order for the patient to check blood pressure once weekly but she already does it three times daily. She is wondering if Dr. Tanya Nones is aware.  Callback #: [phone]

## 2023-09-05 NOTE — Telephone Encounter (Signed)
 Updated order for BP checks faxed to [facility]. Mjp,lpn

## 2023-10-10 DIAGNOSIS — D638 Anemia in other chronic diseases classified elsewhere: Secondary | ICD-10-CM | POA: Diagnosis not present

## 2023-10-10 DIAGNOSIS — D539 Nutritional anemia, unspecified: Secondary | ICD-10-CM | POA: Diagnosis not present

## 2023-10-10 DIAGNOSIS — N184 Chronic kidney disease, stage 4 (severe): Secondary | ICD-10-CM | POA: Diagnosis not present

## 2023-10-10 DIAGNOSIS — R809 Proteinuria, unspecified: Secondary | ICD-10-CM | POA: Diagnosis not present

## 2023-10-10 DIAGNOSIS — D7589 Other specified diseases of blood and blood-forming organs: Secondary | ICD-10-CM | POA: Diagnosis not present

## 2023-10-10 DIAGNOSIS — D649 Anemia, unspecified: Secondary | ICD-10-CM | POA: Diagnosis not present

## 2023-10-10 DIAGNOSIS — N189 Chronic kidney disease, unspecified: Secondary | ICD-10-CM | POA: Diagnosis not present

## 2023-10-10 DIAGNOSIS — D631 Anemia in chronic kidney disease: Secondary | ICD-10-CM | POA: Diagnosis not present

## 2023-10-17 DIAGNOSIS — N184 Chronic kidney disease, stage 4 (severe): Secondary | ICD-10-CM | POA: Diagnosis not present

## 2023-10-17 DIAGNOSIS — I129 Hypertensive chronic kidney disease with stage 1 through stage 4 chronic kidney disease, or unspecified chronic kidney disease: Secondary | ICD-10-CM | POA: Diagnosis not present

## 2023-10-17 DIAGNOSIS — N2581 Secondary hyperparathyroidism of renal origin: Secondary | ICD-10-CM | POA: Diagnosis not present

## 2023-10-17 DIAGNOSIS — R809 Proteinuria, unspecified: Secondary | ICD-10-CM | POA: Diagnosis not present

## 2023-12-25 ENCOUNTER — Telehealth: Payer: Self-pay

## 2023-12-25 NOTE — Telephone Encounter (Signed)
 Pt's hearing aids are not working per [place] at [employer]. Can a referral be placed for ENT? Thanks.

## 2023-12-26 ENCOUNTER — Other Ambulatory Visit: Payer: Self-pay

## 2023-12-26 DIAGNOSIS — H9193 Unspecified hearing loss, bilateral: Secondary | ICD-10-CM

## 2023-12-28 ENCOUNTER — Encounter (INDEPENDENT_AMBULATORY_CARE_PROVIDER_SITE_OTHER): Payer: Self-pay

## 2024-01-10 ENCOUNTER — Ambulatory Visit: Payer: PPO | Admitting: *Deleted

## 2024-01-10 VITALS — Wt 138.0 lb

## 2024-01-10 DIAGNOSIS — Z0000 Encounter for general adult medical examination without abnormal findings: Secondary | ICD-10-CM | POA: Diagnosis not present

## 2024-01-10 NOTE — Patient Instructions (Signed)
 [name] , Thank you for taking time to come for your Medicare Wellness Visit. I appreciate your ongoing commitment to your health goals. Please review the following plan we discussed and let me know if I can assist you in the future.   Screening recommendations/referrals: Colonoscopy: no longer required Mammogram: no longer required Bone Density: no longer required Recommended yearly ophthalmology/optometry visit for glaucoma screening and checkup Recommended yearly dental visit for hygiene and checkup  Vaccinations: Influenza vaccine:  Pneumococcal vaccine:  Tdap vaccine:  Shingles vaccine:         Preventive Care 65 Years and Older, Female Preventive care refers to lifestyle choices and visits with your health care provider that can promote health and wellness. What does preventive care include? A yearly physical exam. This is also called an annual well check. Dental exams once or twice a year. Routine eye exams. Ask your health care provider how often you should have your eyes checked. Personal lifestyle choices, including: Daily care of your teeth and gums. Regular physical activity. Eating a healthy diet. Avoiding tobacco and drug use. Limiting alcohol use. Practicing safe sex. Taking low-dose aspirin  every day. Taking vitamin and mineral supplements as recommended by your health care provider. What happens during an annual well check? The services and screenings done by your health care provider during your annual well check will depend on your age, overall health, lifestyle risk factors, and family history of disease. Counseling  Your health care provider may ask you questions about your: Alcohol use. Tobacco use. Drug use. Emotional well-being. Home and relationship well-being. Sexual activity. Eating habits. History of falls. Memory and ability to understand (cognition). Work and work Astronomer. Reproductive health. Screening  You may have the  following tests or measurements: Height, weight, and BMI. Blood pressure. Lipid and cholesterol levels. These may be checked every 5 years, or more frequently if you are over 15 years old. Skin check. Lung cancer screening. You may have this screening every year starting at age 104 if you have a 30-pack-year history of smoking and currently smoke or have quit within the past 15 years. Fecal occult blood test (FOBT) of the stool. You may have this test every year starting at age 76. Flexible sigmoidoscopy or colonoscopy. You may have a sigmoidoscopy every 5 years or a colonoscopy every 10 years starting at age 73. Hepatitis C blood test. Hepatitis B blood test. Sexually transmitted disease (STD) testing. Diabetes screening. This is done by checking your blood sugar (glucose) after you have not eaten for a while (fasting). You may have this done every 1-3 years. Bone density scan. This is done to screen for osteoporosis. You may have this done starting at age 19. Mammogram. This may be done every 1-2 years. Talk to your health care provider about how often you should have regular mammograms. Talk with your health care provider about your test results, treatment options, and if necessary, the need for more tests. Vaccines  Your health care provider may recommend certain vaccines, such as: Influenza vaccine. This is recommended every year. Tetanus, diphtheria, and acellular pertussis (Tdap, Td) vaccine. You may need a Td booster every 10 years. Zoster vaccine. You may need this after age 55. Pneumococcal 13-valent conjugate (PCV13) vaccine. One dose is recommended after age 30. Pneumococcal polysaccharide (PPSV23) vaccine. One dose is recommended after age 67. Talk to your health care provider about which screenings and vaccines you need and how often you need them. This information is not intended to replace  advice given to you by your health care provider. Make sure you discuss any questions you  have with your health care provider. Document Released: 06/18/2015 Document Revised: 02/09/2016 Document Reviewed: 03/23/2015 Elsevier Interactive Patient Education  2017 [employer].  Fall Prevention in the Home Falls can cause injuries. They can happen to people of all ages. There are many things you can do to make your home safe and to help prevent falls. What can I do on the outside of my home? Regularly fix the edges of walkways and driveways and fix any cracks. Remove anything that might make you trip as you walk through a door, such as a raised step or threshold. Trim any bushes or trees on the path to your home. Use bright outdoor lighting. Clear any walking paths of anything that might make someone trip, such as rocks or tools. Regularly check to see if handrails are loose or broken. Make sure that both sides of any steps have handrails. Any raised decks and porches should have guardrails on the edges. Have any leaves, snow, or ice cleared regularly. Use sand or salt on walking paths during winter. Clean up any spills in your garage right away. This includes oil or grease spills. What can I do in the bathroom? Use night lights. Install grab bars by the toilet and in the tub and shower. Do not use towel bars as grab bars. Use non-skid mats or decals in the tub or shower. If you need to sit down in the shower, use a plastic, non-slip stool. Keep the floor dry. Clean up any water  that spills on the floor as soon as it happens. Remove soap buildup in the tub or shower regularly. Attach bath mats securely with double-sided non-slip rug tape. Do not have throw rugs and other things on the floor that can make you trip. What can I do in the bedroom? Use night lights. Make sure that you have a light by your bed that is easy to reach. Do not use any sheets or blankets that are too big for your bed. They should not hang down onto the floor. Have a firm chair that has side arms. You can  use this for support while you get dressed. Do not have throw rugs and other things on the floor that can make you trip. What can I do in the kitchen? Clean up any spills right away. Avoid walking on wet floors. Keep items that you use a lot in easy-to-reach places. If you need to reach something above you, use a strong step stool that has a grab bar. Keep electrical cords out of the way. Do not use floor polish or wax that makes floors slippery. If you must use wax, use non-skid floor wax. Do not have throw rugs and other things on the floor that can make you trip. What can I do with my stairs? Do not leave any items on the stairs. Make sure that there are handrails on both sides of the stairs and use them. Fix handrails that are broken or loose. Make sure that handrails are as long as the stairways. Check any carpeting to make sure that it is firmly attached to the stairs. Fix any carpet that is loose or worn. Avoid having throw rugs at the top or bottom of the stairs. If you do have throw rugs, attach them to the floor with carpet tape. Make sure that you have a light switch at the top of the stairs and the bottom  of the stairs. If you do not have them, ask someone to add them for you. What else can I do to help prevent falls? Wear shoes that: Do not have high heels. Have rubber bottoms. Are comfortable and fit you well. Are closed at the toe. Do not wear sandals. If you use a stepladder: Make sure that it is fully opened. Do not climb a closed stepladder. Make sure that both sides of the stepladder are locked into place. Ask someone to hold it for you, if possible. Clearly mark and make sure that you can see: Any grab bars or handrails. First and last steps. Where the edge of each step is. Use tools that help you move around (mobility aids) if they are needed. These include: Canes. Walkers. Scooters. Crutches. Turn on the lights when you go into a dark area. Replace any light  bulbs as soon as they burn out. Set up your furniture so you have a clear path. Avoid moving your furniture around. If any of your floors are uneven, fix them. If there are any pets around you, be aware of where they are. Review your medicines with your doctor. Some medicines can make you feel dizzy. This can increase your chance of falling. Ask your doctor what other things that you can do to help prevent falls. This information is not intended to replace advice given to you by your health care provider. Make sure you discuss any questions you have with your health care provider. Document Released: 03/18/2009 Document Revised: 10/28/2015 Document Reviewed: 06/26/2014 Elsevier Interactive Patient Education  2017 [employer].

## 2024-01-10 NOTE — Progress Notes (Signed)
 Subjective:   [name] is a 89 y.o. female who presents for Medicare Annual (Subsequent) preventive examination.  Visit Complete: In person Came in from nursing home with aid.  Patient hard of hearing limited speech.      Cardiac Risk Factors include: advanced age (>52men, >47 women);obesity (BMI >30kg/m2);hypertension     Objective:    Today's Vitals   01/10/24 1132  Weight: 138 lb (62.6 kg)   Body mass index is 25.24 kg/m.     01/10/2024   11:40 AM 05/04/2023    7:58 AM 02/08/2023   11:15 AM 01/04/2023   12:30 PM 11/28/2022   12:02 PM 08/12/2021    3:51 PM 12/19/2020    9:28 AM  Advanced Directives  Does Patient Have a Medical Advance Directive? Yes No No Yes No Yes Yes  Type of Clinical research associate of [place];Living will  Healthcare Power of [place];Living will Healthcare Power of [place];Living will  Does patient want to make changes to medical advance directive?    No - Patient declined No - Patient declined  No - Patient declined  Copy of Healthcare Power of Attorney in Chart? No - copy requested   Yes - validated most recent copy scanned in chart (See row information)  Yes - validated most recent copy scanned in chart (See row information) No - copy requested  Would patient like information on creating a medical advance directive?  No - Patient declined   No - Patient declined      Current Medications (verified) Outpatient Encounter Medications as of 01/10/2024  Medication Sig   acetaminophen  (TYLENOL ) 500 MG tablet Take 500 mg by mouth every 6 (six) hours. Every 6 hours while awake.   amLODipine  (NORVASC ) 10 MG tablet Take 1 tablet (10 mg total) by mouth daily.   calcitRIOL (ROCALTROL) 0.25 MCG capsule Take 0.25 mcg by mouth every Monday, Wednesday, and Friday.   carvedilol  (COREG ) 25 MG tablet TAKE (1) TABLET BY MOUTH TWICE DAILY.   clopidogrel  (PLAVIX ) 75 MG tablet TAKE (1) TABLET BY MOUTH ONCE DAILY.    diclofenac Sodium (VOLTAREN) 1 % GEL Apply 4 g topically 4 (four) times daily as needed (Affected knee for pain).   dorzolamide-timolol (COSOPT) 22.3-6.8 MG/ML ophthalmic solution Place 1 drop into both eyes 2 (two) times daily.   hydrALAZINE (APRESOLINE) 50 MG tablet Take 50 mg by mouth 3 (three) times daily.   pantoprazole  (PROTONIX ) 40 MG tablet TAKE (1) TABLET BY MOUTH ONCE DAILY.   ROCKLATAN 0.02-0.005 % SOLN Place 1 drop into both eyes daily.   rosuvastatin  (CRESTOR ) 10 MG tablet Take 10 mg by mouth at bedtime.   senna (SENOKOT) 8.6 MG tablet Take 1 tablet by mouth at bedtime as needed for constipation.   valsartan  (DIOVAN ) 40 MG tablet Take 40 mg by mouth daily.   dextromethorphan-guaiFENesin  (MUCINEX  DM) 30-600 MG 12hr tablet Take 1 tablet by mouth 2 (two) times daily. (Patient not taking: Reported on 01/10/2024)   fosfomycin (MONUROL ) 3 g PACK Take 3 g by mouth See admin instructions. Dissolve 1 packet into 3-4 ounces of water  & drink every 3 days for 3 doses. (Patient not taking: Reported on 01/10/2024)   traMADol (ULTRAM) 50 MG tablet Take 50 mg by mouth every 6 (six) hours as needed for moderate pain (pain score 4-6). (Patient not taking: Reported on 01/10/2024)   No facility-administered encounter medications on file as of 01/10/2024.    Allergies (verified) Patient has  no known allergies.   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)   Past Surgical History:  Procedure Laterality Date   APPENDECTOMY     ESOPHAGEAL DILATION N/A 07/11/2018   Procedure: ESOPHAGEAL  DILATION;  Surgeon: Golda Claudis PENNER, MD;  Location: [facility];  Service: Endoscopy;  Laterality: N/A;   ESOPHAGOGASTRODUODENOSCOPY N/A 04/28/2018   Procedure: ESOPHAGOGASTRODUODENOSCOPY (EGD);  Surgeon: Golda Claudis PENNER, MD;  Location: [facility];  Service: Endoscopy;  Laterality: N/A;   ESOPHAGOGASTRODUODENOSCOPY N/A 07/11/2018   Procedure: ESOPHAGOGASTRODUODENOSCOPY (EGD);  Surgeon: Golda Claudis PENNER, MD;  Location: [facility];  Service: Endoscopy;  Laterality: N/A;  1200   HARDWARE REMOVAL Right 10/05/2014   Procedure: HARDWARE REMOVAL;  Surgeon: Norleen Gavel, MD;  Location: [facility] OR;  Service: Orthopedics;  Laterality: Right;   INTRAMEDULLARY (IM) NAIL INTERTROCHANTERIC Right 06/12/2014   Procedure: INTRAMEDULLARY (IM) NAIL INTERTROCHANTRIC RIGHT HIP;  Surgeon: Norleen LITTIE Gavel, MD;  Location: [facility] OR;  Service: Orthopedics;  Laterality: Right;   kidney stones     LITHOTRIPSY     PEG PLACEMENT     and removal   PEG TUBE REMOVAL     RADIOLOGY WITH ANESTHESIA  04/22/2012   Procedure: RADIOLOGY WITH ANESTHESIA;  Surgeon: Thyra MARLA Nash, MD;  Location: [facility] OR;  Service: Radiology;  Laterality: N/A;  VERTEBRAL ARTERY STENT PLACEMENT AND CEREBRAL ANGIOGRAM   TONSILLECTOMY     TYMPANOPLASTY     Family History  Problem Relation Age of Onset   CVA Father    Social History   Socioeconomic History   Marital status: Widowed    Spouse name: Alm   Number of children: 0   Years of education: Not on file   Highest education level: Not on file  Occupational History    Comment: Retired Charity fundraiser  Tobacco Use   Smoking status: Former    Current packs/day: 0.00    Types: Cigarettes    Quit date: 10/02/1983    Years since quitting: 40.3   Smokeless tobacco: Never  Vaping Use   Vaping status: Never Used  Substance and Sexual Activity   Alcohol use: Not Currently   Drug use: No   Sexual activity: Not Currently  Other Topics Concern   Not on file  Social History Narrative   Resident at  [facility]    2 step children.   Social Drivers of Corporate investment banker Strain: Low Risk  (01/10/2024)   Overall Financial Resource Strain (CARDIA)    Difficulty of Paying Living Expenses: Not hard at all  Food Insecurity: No Food Insecurity (01/10/2024)   Hunger Vital Sign    Worried About Running Out of Food in the Last Year: Never true    Ran Out of Food in the Last Year: Never true  Transportation Needs: No Transportation Needs (01/10/2024)   PRAPARE - Administrator, Civil Service (Medical): No    Lack of Transportation (Non-Medical): No  Physical Activity: Insufficiently  Active (01/10/2024)   Exercise Vital Sign    Days of Exercise per Week: 2 days    Minutes of Exercise per Session: 20 min  Stress: No Stress Concern Present (01/10/2024)   [employer] of Occupational Health - Occupational Stress Questionnaire    Feeling of Stress: Not at all  Social Connections: Moderately Isolated (01/10/2024)   Social Connection and Isolation Panel    Frequency of Communication with Friends and Family: Never    Frequency of Social Gatherings with Friends and Family: Twice a week    Attends Religious Services: More than 4 times per year    Active Member of [employer] or Organizations: Yes    Attends Banker Meetings: More than 4 times per year    Marital Status: Widowed    Tobacco Counseling Counseling given: Not Answered   Clinical Intake:  Pre-visit preparation completed: Yes  Pain : No/denies pain     Diabetes: No  How often do you need to have someone help you when you read instructions, pamphlets, or other written materials from your doctor or pharmacy?: 4 - Often  Interpreter Needed?: No  Information entered by :: Mliss Graff LPN   Activities of Daily Living    01/10/2024   11:31 AM  In your present state of health, do you have any difficulty performing the following activities:  Hearing? 1  Vision? 0  Difficulty concentrating  or making decisions? 1  Walking or climbing stairs? 1  Dressing or bathing? 1  Doing errands, shopping? 1  Preparing Food and eating ? Y  Using the Toilet? N  In the past six months, have you accidently leaked urine? N  Do you have problems with loss of bowel control? N  Managing your Medications? Y  Managing your Finances? Y  Housekeeping or managing your Housekeeping? Y    Patient Care Team: Duanne Butler DASEN, MD as PCP - General (Family Medicine) Nicholaus Sherlean CROME, [facility] (Inactive) as Pharmacist (Pharmacist) Rachele Gaynell RAMAN, MD as Consulting Physician (Nephrology)  Indicate any recent Medical Services you may have received from other than [facility] providers in the past year (date may be approximate).     Assessment:   This is a routine wellness examination for [place].  Hearing/Vision screen Hearing Screening - Comments:: Hard of hearing  Does have hearing aids Does not wear them Vision Screening - Comments:: [facility] Up to date   Goals Addressed             This Visit's Progress    Exercise 3x per week (30 min per time)   Not on track    Try to increase exercise.      Patient Stated       Continue current lifestyle        Depression Screen    01/10/2024   11:33 AM 01/04/2023   12:31 PM 12/04/2022   12:52 PM 11/20/2022   10:23 AM 06/13/2022    9:18 AM 08/12/2021    3:44 PM 12/02/2019    8:40 AM  PHQ 2/9 Scores  PHQ - 2 Score 0 1 1 0 0 0 0  PHQ- 9 Score 0 1 2        Fall Risk    01/10/2024   11:34 AM 01/04/2023   12:29 PM 12/04/2022   12:51 PM 11/20/2022   10:23 AM 06/13/2022    9:18 AM  Fall Risk   Falls in the past year? 0 1 1 1  0  Number  falls in past yr: 0 0 0 0 0  Injury with Fall? 0 0 0 0 0  Risk for fall due to :  History of fall(s);Impaired balance/gait;Impaired mobility  History of fall(s);Impaired balance/gait;Impaired mobility No Fall Risks  Follow up Falls evaluation completed;Education provided;Falls prevention discussed Falls prevention  discussed;Education provided;Falls evaluation completed  Education provided;Falls prevention discussed Falls prevention discussed      Data saved with a previous flowsheet row definition    MEDICARE RISK AT HOME: Medicare Risk at Home Any stairs in or around the home?: No If so, are there any without handrails?: No Home free of loose throw rugs in walkways, pet beds, electrical cords, etc?: Yes Adequate lighting in your home to reduce risk of falls?: Yes Life alert?: No Use of a cane, walker or w/c?: Yes Grab bars in the bathroom?: Yes Shower chair or bench in shower?: Yes Elevated toilet seat or a handicapped toilet?: Yes  TIMED UP AND GO:  Was the test performed?  Yes  Length of time to ambulate 10 feet:  sec Gait slow and steady with assistive device    Cognitive Function:    Patient came in with care giver from nursing home.  Very hard of hearing and limited speech.   Unable to complete   6CIT    01/04/2023   12:30 PM  6CIT Screen  What Year? 0 points  What month? 0 points  What time? 0 points  Count back from 20 0 points  Months in reverse 4 points  Repeat phrase 4 points  Total Score 8 points    Immunizations Immunization History  Administered Date(s) Administered   Fluad Quad(high Dose 65+) 03/23/2021   Influenza, High Dose Seasonal PF 03/28/2018, 03/04/2019   Influenza-Unspecified 04/14/2020   PFIZER(Purple Top)SARS-COV-2 Vaccination 07/03/2019, 07/25/2019, 05/21/2020   Pneumococcal Polysaccharide-23 02/01/2021   Zoster, Live 02/25/2016    TDAP status: Due, Education has been provided regarding the importance of this vaccine. Advised may receive this vaccine at local pharmacy or Health Dept. Aware to provide a copy of the vaccination record if obtained from local pharmacy or Health Dept. Verbalized acceptance and understanding.  Flu Vaccine status: Due, Education has been provided regarding the importance of this vaccine. Advised may receive this vaccine at  local pharmacy or Health Dept. Aware to provide a copy of the vaccination record if obtained from local pharmacy or Health Dept. Verbalized acceptance and understanding.  Pneumococcal vaccine status: Due, Education has been provided regarding the importance of this vaccine. Advised may receive this vaccine at local pharmacy or Health Dept. Aware to provide a copy of the vaccination record if obtained from local pharmacy or Health Dept. Verbalized acceptance and understanding.  Covid-19 vaccine status: Information provided on how to obtain vaccines.   Qualifies for Shingles Vaccine? Yes   Zostavax completed Yes   Shingrix Completed?: No.    Education has been provided regarding the importance of this vaccine. Patient has been advised to call insurance company to determine out of pocket expense if they have not yet received this vaccine. Advised may also receive vaccine at local pharmacy or Health Dept. Verbalized acceptance and understanding.  Screening Tests Health Maintenance  Topic Date Due   Zoster Vaccines- Shingrix (1 of 2) 03/30/1987   Pneumococcal Vaccine: 50+ Years (2 of 2 - PCV) 02/01/2022   INFLUENZA VACCINE  01/04/2024   Medicare Annual Wellness (AWV)  01/09/2025   Hepatitis B Vaccines  Aged Out   HPV VACCINES  Aged Out  Meningococcal B Vaccine  Aged Out   DTaP/Tdap/Td  Discontinued   DEXA SCAN  Discontinued   COVID-19 Vaccine  Discontinued    Health Maintenance  Health Maintenance Due  Topic Date Due   Zoster Vaccines- Shingrix (1 of 2) 03/30/1987   Pneumococcal Vaccine: 50+ Years (2 of 2 - PCV) 02/01/2022   INFLUENZA VACCINE  01/04/2024    Colorectal cancer screening: No longer required.   Mammogram status: No longer required due to  .  Bone Density   no longer required  Lung Cancer Screening: (Low Dose CT Chest recommended if Age 82-80 years, 20 pack-year currently smoking OR have quit w/in 15years.) does not qualify.   Lung Cancer Screening Referral:    Additional Screening:  Hepatitis C Screening: does not qualify;   Vision Screening: Recommended annual ophthalmology exams for early detection of glaucoma and other disorders of the eye. Is the patient up to date with their annual eye exam?  Yes  Who is the provider or what is the name of the office in which the patient attends annual eye exams? [facility] If pt is not established with a provider, would they like to be referred to a provider to establish care? No .   Dental Screening: Recommended annual dental exams for proper oral hygiene    Community Resource Referral / Chronic Care Management: CRR required this visit?  No   CCM required this visit?  No     Plan:     I have personally reviewed and noted the following in the patient's chart:   Medical and social history Use of alcohol, tobacco or illicit drugs  Current medications and supplements including opioid prescriptions. Patient is not currently taking opioid prescriptions. Functional ability and status Nutritional status Physical activity Advanced directives List of other physicians Hospitalizations, surgeries, and ER visits in previous 12 months Vitals Screenings to include cognitive, depression, and falls Referrals and appointments  In addition, I have reviewed and discussed with patient certain preventive protocols, quality metrics, and best practice recommendations. A written personalized care plan for preventive services as well as general preventive health recommendations were provided to patient.     Mliss Graff, LPN   06/06/7972   After Visit Summary: (MyChart) Due to this being a telephonic visit, the after visit summary with patients personalized plan was offered to patient via MyChart   Nurse Notes:

## 2024-01-15 DIAGNOSIS — R809 Proteinuria, unspecified: Secondary | ICD-10-CM | POA: Diagnosis not present

## 2024-01-15 DIAGNOSIS — D631 Anemia in chronic kidney disease: Secondary | ICD-10-CM | POA: Diagnosis not present

## 2024-01-15 DIAGNOSIS — E211 Secondary hyperparathyroidism, not elsewhere classified: Secondary | ICD-10-CM | POA: Diagnosis not present

## 2024-01-15 DIAGNOSIS — N189 Chronic kidney disease, unspecified: Secondary | ICD-10-CM | POA: Diagnosis not present

## 2024-01-17 DIAGNOSIS — H04123 Dry eye syndrome of bilateral lacrimal glands: Secondary | ICD-10-CM | POA: Diagnosis not present

## 2024-01-17 DIAGNOSIS — H40033 Anatomical narrow angle, bilateral: Secondary | ICD-10-CM | POA: Diagnosis not present

## 2024-01-23 DIAGNOSIS — I129 Hypertensive chronic kidney disease with stage 1 through stage 4 chronic kidney disease, or unspecified chronic kidney disease: Secondary | ICD-10-CM | POA: Diagnosis not present

## 2024-01-23 DIAGNOSIS — N184 Chronic kidney disease, stage 4 (severe): Secondary | ICD-10-CM | POA: Diagnosis not present

## 2024-01-23 DIAGNOSIS — N179 Acute kidney failure, unspecified: Secondary | ICD-10-CM | POA: Diagnosis not present

## 2024-01-23 DIAGNOSIS — R809 Proteinuria, unspecified: Secondary | ICD-10-CM | POA: Diagnosis not present

## 2024-02-14 DIAGNOSIS — E119 Type 2 diabetes mellitus without complications: Secondary | ICD-10-CM | POA: Diagnosis not present

## 2024-02-14 DIAGNOSIS — D631 Anemia in chronic kidney disease: Secondary | ICD-10-CM | POA: Diagnosis not present

## 2024-02-14 DIAGNOSIS — N189 Chronic kidney disease, unspecified: Secondary | ICD-10-CM | POA: Diagnosis not present

## 2024-02-14 DIAGNOSIS — R809 Proteinuria, unspecified: Secondary | ICD-10-CM | POA: Diagnosis not present

## 2024-02-14 DIAGNOSIS — I10 Essential (primary) hypertension: Secondary | ICD-10-CM | POA: Diagnosis not present

## 2024-02-14 DIAGNOSIS — E211 Secondary hyperparathyroidism, not elsewhere classified: Secondary | ICD-10-CM | POA: Diagnosis not present

## 2024-02-20 ENCOUNTER — Inpatient Hospital Stay (HOSPITAL_COMMUNITY)
Admission: EM | Admit: 2024-02-20 | Discharge: 2024-02-25 | DRG: 689 | Disposition: A | Discharge status: Skilled Nursing Facility | Source: Other Acute Inpatient Hospital | Attending: Family Medicine | Admitting: Family Medicine

## 2024-02-20 ENCOUNTER — Inpatient Hospital Stay (HOSPITAL_COMMUNITY)

## 2024-02-20 ENCOUNTER — Emergency Department (HOSPITAL_COMMUNITY)

## 2024-02-20 ENCOUNTER — Other Ambulatory Visit: Payer: Self-pay

## 2024-02-20 ENCOUNTER — Encounter (HOSPITAL_COMMUNITY): Payer: Self-pay

## 2024-02-20 DIAGNOSIS — I69391 Dysphagia following cerebral infarction: Secondary | ICD-10-CM | POA: Diagnosis not present

## 2024-02-20 DIAGNOSIS — E7800 Pure hypercholesterolemia, unspecified: Secondary | ICD-10-CM | POA: Diagnosis present

## 2024-02-20 DIAGNOSIS — Z1611 Resistance to penicillins: Secondary | ICD-10-CM | POA: Diagnosis present

## 2024-02-20 DIAGNOSIS — B961 Klebsiella pneumoniae [K. pneumoniae] as the cause of diseases classified elsewhere: Secondary | ICD-10-CM | POA: Diagnosis present

## 2024-02-20 DIAGNOSIS — R4701 Aphasia: Secondary | ICD-10-CM | POA: Diagnosis not present

## 2024-02-20 DIAGNOSIS — N184 Chronic kidney disease, stage 4 (severe): Secondary | ICD-10-CM | POA: Diagnosis not present

## 2024-02-20 DIAGNOSIS — D631 Anemia in chronic kidney disease: Secondary | ICD-10-CM | POA: Diagnosis present

## 2024-02-20 DIAGNOSIS — Z8673 Personal history of transient ischemic attack (TIA), and cerebral infarction without residual deficits: Secondary | ICD-10-CM | POA: Diagnosis not present

## 2024-02-20 DIAGNOSIS — I69351 Hemiplegia and hemiparesis following cerebral infarction affecting right dominant side: Secondary | ICD-10-CM

## 2024-02-20 DIAGNOSIS — Z87891 Personal history of nicotine dependence: Secondary | ICD-10-CM | POA: Diagnosis not present

## 2024-02-20 DIAGNOSIS — N179 Acute kidney failure, unspecified: Secondary | ICD-10-CM | POA: Diagnosis not present

## 2024-02-20 DIAGNOSIS — I6523 Occlusion and stenosis of bilateral carotid arteries: Secondary | ICD-10-CM | POA: Diagnosis not present

## 2024-02-20 DIAGNOSIS — G9341 Metabolic encephalopathy: Secondary | ICD-10-CM | POA: Diagnosis not present

## 2024-02-20 DIAGNOSIS — Z79899 Other long term (current) drug therapy: Secondary | ICD-10-CM

## 2024-02-20 DIAGNOSIS — Z66 Do not resuscitate: Secondary | ICD-10-CM | POA: Diagnosis present

## 2024-02-20 DIAGNOSIS — G928 Other toxic encephalopathy: Secondary | ICD-10-CM | POA: Diagnosis present

## 2024-02-20 DIAGNOSIS — Z974 Presence of external hearing-aid: Secondary | ICD-10-CM

## 2024-02-20 DIAGNOSIS — I700 Atherosclerosis of aorta: Secondary | ICD-10-CM | POA: Diagnosis not present

## 2024-02-20 DIAGNOSIS — I69320 Aphasia following cerebral infarction: Secondary | ICD-10-CM

## 2024-02-20 DIAGNOSIS — F0390 Unspecified dementia without behavioral disturbance: Secondary | ICD-10-CM | POA: Diagnosis present

## 2024-02-20 DIAGNOSIS — N390 Urinary tract infection, site not specified: Principal | ICD-10-CM | POA: Diagnosis present

## 2024-02-20 DIAGNOSIS — R0989 Other specified symptoms and signs involving the circulatory and respiratory systems: Secondary | ICD-10-CM | POA: Diagnosis not present

## 2024-02-20 DIAGNOSIS — Z7902 Long term (current) use of antithrombotics/antiplatelets: Secondary | ICD-10-CM | POA: Diagnosis not present

## 2024-02-20 DIAGNOSIS — Z823 Family history of stroke: Secondary | ICD-10-CM

## 2024-02-20 DIAGNOSIS — G9340 Encephalopathy, unspecified: Secondary | ICD-10-CM | POA: Diagnosis not present

## 2024-02-20 DIAGNOSIS — Z8711 Personal history of peptic ulcer disease: Secondary | ICD-10-CM | POA: Diagnosis not present

## 2024-02-20 DIAGNOSIS — N189 Chronic kidney disease, unspecified: Secondary | ICD-10-CM | POA: Diagnosis not present

## 2024-02-20 DIAGNOSIS — K279 Peptic ulcer, site unspecified, unspecified as acute or chronic, without hemorrhage or perforation: Secondary | ICD-10-CM | POA: Diagnosis present

## 2024-02-20 DIAGNOSIS — I6782 Cerebral ischemia: Secondary | ICD-10-CM | POA: Diagnosis not present

## 2024-02-20 DIAGNOSIS — I517 Cardiomegaly: Secondary | ICD-10-CM | POA: Diagnosis not present

## 2024-02-20 DIAGNOSIS — I10 Essential (primary) hypertension: Secondary | ICD-10-CM | POA: Diagnosis present

## 2024-02-20 DIAGNOSIS — I129 Hypertensive chronic kidney disease with stage 1 through stage 4 chronic kidney disease, or unspecified chronic kidney disease: Secondary | ICD-10-CM | POA: Diagnosis present

## 2024-02-20 DIAGNOSIS — R4182 Altered mental status, unspecified: Secondary | ICD-10-CM | POA: Diagnosis not present

## 2024-02-20 DIAGNOSIS — I959 Hypotension, unspecified: Secondary | ICD-10-CM | POA: Diagnosis not present

## 2024-02-20 DIAGNOSIS — H9193 Unspecified hearing loss, bilateral: Secondary | ICD-10-CM | POA: Diagnosis present

## 2024-02-20 DIAGNOSIS — R001 Bradycardia, unspecified: Secondary | ICD-10-CM | POA: Diagnosis not present

## 2024-02-20 DIAGNOSIS — G9389 Other specified disorders of brain: Secondary | ICD-10-CM | POA: Diagnosis not present

## 2024-02-20 LAB — CBC
HCT: 29.3 % — ABNORMAL LOW (ref 36.0–46.0)
Hemoglobin: 9.2 g/dL — ABNORMAL LOW (ref 12.0–15.0)
MCH: 27.8 pg (ref 26.0–34.0)
MCHC: 31.4 g/dL (ref 30.0–36.0)
MCV: 88.5 fL (ref 80.0–100.0)
Platelets: 167 K/uL (ref 150–400)
RBC: 3.31 MIL/uL — ABNORMAL LOW (ref 3.87–5.11)
RDW: 13.5 % (ref 11.5–15.5)
WBC: 4.8 K/uL (ref 4.0–10.5)
nRBC: 0 % (ref 0.0–0.2)

## 2024-02-20 LAB — URINALYSIS, ROUTINE W REFLEX MICROSCOPIC
Bilirubin Urine: NEGATIVE
Glucose, UA: NEGATIVE mg/dL
Hgb urine dipstick: NEGATIVE
Ketones, ur: NEGATIVE mg/dL
Nitrite: NEGATIVE
Protein, ur: NEGATIVE mg/dL
Specific Gravity, Urine: 1.011 (ref 1.005–1.030)
WBC, UA: >50 WBC/hpf (ref 0–5)
pH: 6 (ref 5.0–8.0)

## 2024-02-20 LAB — COMPREHENSIVE METABOLIC PANEL WITH GFR
ALT: 13 U/L (ref 0–44)
AST: 16 U/L (ref 15–41)
Albumin: 3.4 g/dL — ABNORMAL LOW (ref 3.5–5.0)
Alkaline Phosphatase: 106 U/L (ref 38–126)
Anion gap: 13 (ref 5–15)
BUN: 51 mg/dL — ABNORMAL HIGH (ref 8–23)
CO2: 23 mmol/L (ref 22–32)
Calcium: 9.1 mg/dL (ref 8.9–10.3)
Chloride: 108 mmol/L (ref 98–111)
Creatinine, Ser: 3.14 mg/dL — ABNORMAL HIGH (ref 0.44–1.00)
GFR, Estimated: 14 mL/min — ABNORMAL LOW (ref >60)
Glucose, Bld: 110 mg/dL — ABNORMAL HIGH (ref 70–99)
Potassium: 3.9 mmol/L (ref 3.5–5.1)
Sodium: 144 mmol/L (ref 135–145)
Total Bilirubin: 0.8 mg/dL (ref 0.0–1.2)
Total Protein: 6.5 g/dL (ref 6.5–8.1)

## 2024-02-20 LAB — LACTIC ACID, PLASMA: Lactic Acid, Venous: 0.7 mmol/L (ref 0.5–1.9)

## 2024-02-20 MED ORDER — HYDRALAZINE HCL 50 MG PO TABS
50.0000 mg | ORAL_TABLET | Freq: Three times a day (TID) | ORAL | Status: DC
Start: 2024-02-20 — End: 2024-02-23
  Administered 2024-02-20 – 2024-02-22 (×5): 50 mg via ORAL
  Filled 2024-02-20: qty 1
  Filled 2024-02-20 (×3): qty 2
  Filled 2024-02-20: qty 1
  Filled 2024-02-20 (×3): qty 2

## 2024-02-20 MED ORDER — POLYETHYLENE GLYCOL 3350 17 G PO PACK: 17.0000 g | PACK | Freq: Every day | ORAL | Status: DC | PRN

## 2024-02-20 MED ORDER — CARVEDILOL 12.5 MG PO TABS
25.0000 mg | ORAL_TABLET | Freq: Two times a day (BID) | ORAL | Status: DC
  Administered 2024-02-21 – 2024-02-25 (×9): 25 mg via ORAL
  Filled 2024-02-20 (×9): qty 2

## 2024-02-20 MED ORDER — ACETAMINOPHEN 650 MG RE SUPP: 650.0000 mg | Freq: Four times a day (QID) | RECTAL | Status: DC | PRN

## 2024-02-20 MED ORDER — SODIUM CHLORIDE 0.9 % IV SOLN
1.0000 g | Freq: Once | INTRAVENOUS | Status: AC
  Administered 2024-02-20: 1 g via INTRAVENOUS
  Filled 2024-02-20: qty 10

## 2024-02-20 MED ORDER — PANTOPRAZOLE SODIUM 40 MG PO TBEC
40.0000 mg | DELAYED_RELEASE_TABLET | Freq: Every day | ORAL | Status: DC
  Administered 2024-02-21 – 2024-02-25 (×5): 40 mg via ORAL
  Filled 2024-02-20 (×5): qty 1

## 2024-02-20 MED ORDER — HEPARIN SODIUM (PORCINE) 5000 UNIT/ML IJ SOLN
5000.0000 [IU] | Freq: Three times a day (TID) | INTRAMUSCULAR | Status: DC
Start: 2024-02-20 — End: 2024-02-23
  Administered 2024-02-20 – 2024-02-23 (×8): 5000 [IU] via SUBCUTANEOUS
  Filled 2024-02-20 (×8): qty 1

## 2024-02-20 MED ORDER — CEFTRIAXONE SODIUM 1 G IJ SOLR
1.0000 g | INTRAMUSCULAR | Status: DC
  Administered 2024-02-21 – 2024-02-23 (×3): 1 g via INTRAVENOUS
  Filled 2024-02-20 (×3): qty 10

## 2024-02-20 MED ORDER — AMLODIPINE BESYLATE 5 MG PO TABS
10.0000 mg | ORAL_TABLET | Freq: Every day | ORAL | Status: DC
Start: 2024-02-21 — End: 2024-02-23
  Administered 2024-02-21 – 2024-02-22 (×2): 10 mg via ORAL
  Filled 2024-02-20 (×3): qty 2

## 2024-02-20 MED ORDER — ONDANSETRON HCL 4 MG PO TABS: 4.0000 mg | ORAL_TABLET | Freq: Four times a day (QID) | ORAL | Status: DC | PRN

## 2024-02-20 MED ORDER — CLOPIDOGREL BISULFATE 75 MG PO TABS
75.0000 mg | ORAL_TABLET | Freq: Every day | ORAL | Status: DC
Start: 2024-02-21 — End: 2024-02-25
  Administered 2024-02-21 – 2024-02-25 (×5): 75 mg via ORAL
  Filled 2024-02-20 (×5): qty 1

## 2024-02-20 MED ORDER — LACTATED RINGERS IV BOLUS
1000.0000 mL | Freq: Once | INTRAVENOUS | Status: AC
  Administered 2024-02-20: 1000 mL via INTRAVENOUS

## 2024-02-20 MED ORDER — LACTATED RINGERS IV SOLN: INTRAVENOUS | Status: DC

## 2024-02-20 MED ORDER — ACETAMINOPHEN 325 MG PO TABS: 650.0000 mg | ORAL_TABLET | Freq: Four times a day (QID) | ORAL | Status: DC | PRN

## 2024-02-20 MED ORDER — ONDANSETRON HCL 4 MG/2ML IJ SOLN: 4.0000 mg | Freq: Four times a day (QID) | INTRAMUSCULAR | Status: DC | PRN

## 2024-02-20 MED ORDER — ROSUVASTATIN CALCIUM 10 MG PO TABS
10.0000 mg | ORAL_TABLET | Freq: Every day | ORAL | Status: DC
Start: 2024-02-20 — End: 2024-02-25
  Administered 2024-02-20 – 2024-02-24 (×5): 10 mg via ORAL
  Filled 2024-02-20 (×5): qty 1

## 2024-02-20 NOTE — Assessment & Plan Note (Addendum)
 With baseline aphasia and right-sided deficits.  Nursing home resident. - Resume Plavix , Crestor 

## 2024-02-20 NOTE — ED Notes (Signed)
 Hospitalist at bedside

## 2024-02-20 NOTE — ED Notes (Signed)
 Patient transported to CT

## 2024-02-20 NOTE — Assessment & Plan Note (Addendum)
 Creatinine 3.14, recent baseline 2.4-2.5.  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 - Hold Valsartan  - 1 L bolus given, continue LR 75cc/hr x 20hrs

## 2024-02-20 NOTE — ED Triage Notes (Signed)
 Pt arrived via [facility] from [facility] for evaluation of AMS. Per EMS, they were called out due to Pt not wanting to participate in an Ice Cream Party today and per facility, Pt has not been acting like herself. Pt has previous stroke and has right side weakness at baseline.

## 2024-02-20 NOTE — H&P (Signed)
 History and Physical    [name] FMW:[id number] DOB: [birth year] DOA: 02/20/2024  PCP: Duanne Butler DASEN, MD   Patient coming from: [facility]  I have personally briefly reviewed patient's old medical records in [facility]  Chief Complaint: Change in behaviour  HPI: [name] is a 89 y.o. female with medical history significant for CKD 3, hypertension, stroke right-sided deficits and aphasia, peptic ulcer disease. Patient was brought to the ED from nursing home with reports of generalized weakness, and not participating in activities as she would normally do-this was unusual for her.  Daughter reported that patient has aphasia, and this was usually consistent with her UTI infections.  On my evaluation, patient is awake and alert and talking but speech is incomprehensible.  ED Course: Temperature 97.1.  Heart rate 59-70, respiratory 12-17.  Blood pressure systolic 111-130.  O2 sats greater than 92% on room air. UA suggestive of UTI.  Head CT without contrast generalized cerebral atrophy with chronic white matter small ischemic vessel changes. IV ceftriaxone , 1 L bolus given.  Blood cultures obtained.  Review of Systems: As per HPI all other systems reviewed and negative.  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    Constipation    Degenerative joint disease of knee, right    right knee   Hearing impaired person    wears hearing aids   High cholesterol    History of blood transfusion    Hx: UTI (urinary tract infection)    Hypertension    Kidney stones    Stroke [facility])    speech is effected slightly , right sided weakness  (has complete obstruction left carotid - per husband)    Past Surgical History:  Procedure Laterality Date   APPENDECTOMY     ESOPHAGEAL DILATION N/A 07/11/2018   Procedure: ESOPHAGEAL DILATION;  Surgeon: Golda Claudis PENNER, MD;  Location: [facility];  Service: Endoscopy;  Laterality: N/A;    ESOPHAGOGASTRODUODENOSCOPY N/A 04/28/2018   Procedure: ESOPHAGOGASTRODUODENOSCOPY (EGD);  Surgeon: Golda Claudis PENNER, MD;  Location: [facility];  Service: Endoscopy;  Laterality: N/A;   ESOPHAGOGASTRODUODENOSCOPY N/A 07/11/2018   Procedure: ESOPHAGOGASTRODUODENOSCOPY (EGD);  Surgeon: Golda Claudis PENNER, MD;  Location: [facility];  Service: Endoscopy;  Laterality: N/A;  1200   HARDWARE REMOVAL Right 10/05/2014   Procedure: HARDWARE REMOVAL;  Surgeon: Norleen Gavel, MD;  Location: [facility] OR;  Service: Orthopedics;  Laterality: Right;   INTRAMEDULLARY (IM) NAIL INTERTROCHANTERIC Right 06/12/2014   Procedure: INTRAMEDULLARY (IM) NAIL INTERTROCHANTRIC RIGHT HIP;  Surgeon: Norleen LITTIE Gavel, MD;  Location: [facility] OR;  Service: Orthopedics;  Laterality: Right;   kidney stones     LITHOTRIPSY     PEG PLACEMENT     and removal   PEG TUBE REMOVAL     RADIOLOGY WITH ANESTHESIA  04/22/2012   Procedure: RADIOLOGY WITH ANESTHESIA;  Surgeon: Thyra MARLA Nash, MD;  Location: [facility] OR;  Service: Radiology;  Laterality: N/A;  VERTEBRAL ARTERY STENT PLACEMENT AND CEREBRAL ANGIOGRAM   TONSILLECTOMY     TYMPANOPLASTY       reports that she quit smoking about 40 years ago. Her smoking use included cigarettes. She has never used smokeless tobacco. She reports that she does not currently use alcohol. She reports that she does not use drugs.  No Known Allergies  Family History  Problem Relation Age of Onset   CVA Father     Prior to Admission medications   Medication Sig  Start Date End Date Taking? Authorizing Provider  acetaminophen  (TYLENOL ) 500 MG tablet Take 500 mg by mouth every 6 (six) hours. Every 6 hours while awake.   Yes [provider]  amLODipine  (NORVASC ) 10 MG tablet Take 1 tablet (10 mg total) by mouth daily. 08/23/23  Yes Duanne Butler DASEN, MD  calcitRIOL (ROCALTROL) 0.25 MCG capsule Take 0.25 mcg by mouth every Monday, Wednesday, and Friday. 04/10/23  Yes [provider]  carvedilol  (COREG )  25 MG tablet TAKE (1) TABLET BY MOUTH TWICE DAILY. 08/31/22  Yes Duanne Butler DASEN, MD  clopidogrel  (PLAVIX ) 75 MG tablet TAKE (1) TABLET BY MOUTH ONCE DAILY. 08/31/22  Yes Duanne Butler DASEN, MD  dextromethorphan-guaiFENesin  (MUCINEX  DM) 30-600 MG 12hr tablet Take 1 tablet by mouth 2 (two) times daily. 06/11/23  Yes Duanne Butler DASEN, MD  diclofenac Sodium (VOLTAREN) 1 % GEL Apply 4 g topically 4 (four) times daily as needed (Affected knee for pain). 12/12/22  Yes [provider]  dorzolamide-timolol (COSOPT) 22.3-6.8 MG/ML ophthalmic solution Place 1 drop into both eyes 2 (two) times daily. 11/25/20  Yes [provider]  hydrALAZINE  (APRESOLINE ) 50 MG tablet Take 50 mg by mouth 3 (three) times daily. 12/26/22  Yes [provider]  pantoprazole  (PROTONIX ) 40 MG tablet TAKE (1) TABLET BY MOUTH ONCE DAILY. 08/31/22  Yes Pickard, Butler DASEN, MD  ROCKLATAN 0.02-0.005 % SOLN Place 1 drop into both eyes daily. 11/25/20  Yes [provider]  rosuvastatin  (CRESTOR ) 10 MG tablet Take 10 mg by mouth at bedtime. 12/28/22  Yes [provider]  senna (SENOKOT) 8.6 MG tablet Take 1 tablet by mouth at bedtime as needed for constipation.   Yes [provider]  valsartan  (DIOVAN ) 40 MG tablet Take 40 mg by mouth daily.   Yes [provider]    Physical Exam: Vitals:   02/20/24 1200 02/20/24 1405 02/20/24 1430 02/20/24 1530  BP: (!) 117/54 (!) 113/47 (!) 111/56 125/63  Pulse: 61 61 66 65  Resp: 16 17 12 15   Temp:      TempSrc:      SpO2: 96% 96% 95% 92%  Weight:      Height:        Constitutional: NAD, calm, comfortable Vitals:   02/20/24 1200 02/20/24 1405 02/20/24 1430 02/20/24 1530  BP: (!) 117/54 (!) 113/47 (!) 111/56 125/63  Pulse: 61 61 66 65  Resp: 16 17 12 15   Temp:      TempSrc:      SpO2: 96% 96% 95% 92%  Weight:      Height:       Eyes: PERRL, lids and conjunctivae normal ENMT: Mucous membranes are moist.  Neck: normal, supple, no  masses, no thyromegaly Respiratory: clear to auscultation bilaterally, no wheezing, no crackles. Normal respiratory effort. No accessory muscle use.  Cardiovascular: Regular rate and rhythm, no murmurs / rubs / gallops. No extremity edema.  Extremities warm. Abdomen: no tenderness, no masses palpated. No hepatosplenomegaly. Bowel sounds positive.  Musculoskeletal: no clubbing / cyanosis. No joint deformity upper and lower extremities.  Skin: no rashes, lesions, ulcers. No induration Neurologic: No facial asymmetry.  Aphasic.  Psychiatric: Awake and alert.  Labs on Admission: I have personally reviewed following labs and imaging studies  CBC: Recent Labs  Lab 02/20/24 1401  WBC 4.8  HGB 9.2*  HCT 29.3*  MCV 88.5  PLT 167   Basic Metabolic Panel: Recent Labs  Lab 02/20/24 1401  NA 144  K 3.9  CL 108  CO2 23  GLUCOSE 110*  BUN 51*  CREATININE 3.14*  CALCIUM  9.1   GFR: Estimated Creatinine Clearance: 11.2 mL/min (A) (by C-G formula based on SCr of 3.14 mg/dL (H)). Liver Function Tests: Recent Labs  Lab 02/20/24 1401  AST 16  ALT 13  ALKPHOS 106  BILITOT 0.8  PROT 6.5  ALBUMIN 3.4*       Component Value Date/Time   COLORURINE YELLOW 02/20/2024 1334   APPEARANCEUR HAZY (A) 02/20/2024 1334   LABSPEC 1.011 02/20/2024 1334   PHURINE 6.0 02/20/2024 1334   GLUCOSEU NEGATIVE 02/20/2024 1334   HGBUR NEGATIVE 02/20/2024 1334   BILIRUBINUR NEGATIVE 02/20/2024 1334   KETONESUR NEGATIVE 02/20/2024 1334   PROTEINUR NEGATIVE 02/20/2024 1334   UROBILINOGEN 0.2 06/12/2014 1137   NITRITE NEGATIVE 02/20/2024 1334   LEUKOCYTESUR LARGE (A) 02/20/2024 1334    Radiological Exams on Admission: CT Head Wo Contrast Result Date: 02/20/2024 CLINICAL DATA:  Altered mental status. EXAM: CT HEAD WITHOUT CONTRAST TECHNIQUE: Contiguous axial images were obtained from the base of the skull through the vertex without intravenous contrast. RADIATION DOSE REDUCTION: This exam was performed  according to the departmental dose-optimization program which includes automated exposure control, adjustment of the mA and/or kV according to patient size and/or use of iterative reconstruction technique. COMPARISON:  May 04, 2023 FINDINGS: Brain: There is generalized cerebral atrophy with widening of the extra-axial spaces and ventricular dilatation. There are areas of decreased attenuation within the white matter tracts of the supratentorial brain, consistent with microvascular disease changes. Left basal ganglia, left temporal lobe, left frontal lobe and left parietal lobe encephalomalacia is seen within the left MCA territory. A chronic right basal ganglia lacunar infarct is noted. Vascular: Marked severity bilateral cavernous carotid artery calcification is noted. Skull: Normal. Negative for fracture or focal lesion. Sinuses/Orbits: A 10 mm posterior right maxillary sinus polyp versus mucous retention cyst is seen. Other: None. IMPRESSION: 1. Generalized cerebral atrophy with chronic white matter small vessel ischemic changes. 2. Chronic left MCA territory infarct. 3. Chronic right basal ganglia lacunar infarct. 4. No acute intracranial abnormality. Electronically Signed   By: Suzen Dials M.D.   On: 02/20/2024 15:52   EKG: Independently reviewed.  Sinus rhythm, rate 65.  QTC 464.  Artifacts present.  Assessment/Plan Principal Problem:   Acute metabolic encephalopathy Active Problems:   Acute kidney injury superimposed on CKD (HCC)   History of CVA (cerebrovascular accident)   Hypertension   Expressive aphasia   PUD (peptic ulcer disease)   Assessment and Plan: * Acute metabolic encephalopathy Presenting with change in behavior, generalized weakness. Aphasia right-sided deficits from prior stroke.  UA suggestive of UTI.  Head CT negative for acute abnormality.  Afebrile.  WBC 4.8.  Lactic acid 0.7. -IV ceftriaxone  1 g daily -Follow-up blood and urine cultures -Obtain chest  x-ray  Acute kidney injury superimposed on CKD (HCC) Creatinine 3.14, recent baseline 2.4-2.5.  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 - Hold Valsartan  - 1 L bolus given, continue LR 75cc/hr x 20hrs  Hypertension Stable. -Resume Norvasc , carvedilol , hydralazine  - Hold valsartan  with AKI  History of CVA (cerebrovascular accident) With baseline aphasia and right-sided deficits.  Nursing home resident. - Resume Plavix , Crestor    DVT prophylaxis: Heparin  Code Status: FULL code Family Communication: None at bedside Disposition Plan: ~ 2 days Consults called: None Admission status: Inpt Med surg I certify that at the point of admission it is my clinical judgment that the patient will require inpatient hospital care spanning  beyond 2 midnights from the point of admission due to high intensity of service, high risk for further deterioration and high frequency of surveillance required.   Author: Tully FORBES Carwin, MD 02/20/2024 6:26 PM  For on call review [email or web address].

## 2024-02-20 NOTE — Assessment & Plan Note (Signed)
 Presenting with change in behavior, generalized weakness. Aphasia right-sided deficits from prior stroke.  UA suggestive of UTI.  Head CT negative for acute abnormality.  Afebrile.  WBC 4.8.  Lactic acid 0.7. -IV ceftriaxone  1 g daily -Follow-up blood and urine cultures -Obtain chest x-ray

## 2024-02-20 NOTE — Assessment & Plan Note (Signed)
 Stable. -Resume Norvasc , carvedilol , hydralazine  - Hold valsartan  with AKI

## 2024-02-20 NOTE — ED Notes (Signed)
Pt returned from CT Scan 

## 2024-02-20 NOTE — ED Provider Notes (Signed)
 [place] EMERGENCY DEPARTMENT AT [facility] Provider Note   CSN: [id number] Arrival date & time: 02/20/24  1138     Patient presents with: Altered Mental Status   [name] is a 89 y.o. female.  She has history of prior CVA with baseline right-sided weakness and expressive aphasia.,  CKD, anemia.  She resides at [facility] skilled nursing facility.  EMS was called today as patient has not been acting like herself, reportedly today she did not want to attend extreme party as well furthering their concern.  Patient's daughter called and reported to nursing staff that she has history of UTIs which sometimes cause her to act differently.  Due to the patient's expressive aphasia she is not able to provide history.  Her stepdaughter Randine Gentry was able to provide some collaborative information, states that nursing home noticed today that she was generally weak and not wanting to join into activities and blood pressure was 103/49.        Altered Mental Status      Prior to Admission medications   Medication Sig Start Date End Date Taking? Authorizing Provider  acetaminophen  (TYLENOL ) 500 MG tablet Take 500 mg by mouth every 6 (six) hours. Every 6 hours while awake.   Yes [provider]  amLODipine  (NORVASC ) 10 MG tablet Take 1 tablet (10 mg total) by mouth daily. 08/23/23  Yes Duanne Butler DASEN, MD  calcitRIOL (ROCALTROL) 0.25 MCG capsule Take 0.25 mcg by mouth every Monday, Wednesday, and Friday. 04/10/23  Yes [provider]  carvedilol  (COREG ) 25 MG tablet TAKE (1) TABLET BY MOUTH TWICE DAILY. 08/31/22  Yes Duanne Butler DASEN, MD  clopidogrel  (PLAVIX ) 75 MG tablet TAKE (1) TABLET BY MOUTH ONCE DAILY. 08/31/22  Yes Duanne Butler DASEN, MD  dextromethorphan-guaiFENesin  (MUCINEX  DM) 30-600 MG 12hr tablet Take 1 tablet by mouth 2 (two) times daily. 06/11/23  Yes Duanne Butler DASEN, MD  diclofenac Sodium (VOLTAREN) 1 % GEL Apply 4 g topically 4 (four) times daily  as needed (Affected knee for pain). 12/12/22  Yes [provider]  dorzolamide-timolol (COSOPT) 22.3-6.8 MG/ML ophthalmic solution Place 1 drop into both eyes 2 (two) times daily. 11/25/20  Yes [provider]  hydrALAZINE  (APRESOLINE ) 50 MG tablet Take 50 mg by mouth 3 (three) times daily. 12/26/22  Yes [provider]  pantoprazole  (PROTONIX ) 40 MG tablet TAKE (1) TABLET BY MOUTH ONCE DAILY. 08/31/22  Yes Pickard, Butler DASEN, MD  ROCKLATAN 0.02-0.005 % SOLN Place 1 drop into both eyes daily. 11/25/20  Yes [provider]  rosuvastatin  (CRESTOR ) 10 MG tablet Take 10 mg by mouth at bedtime. 12/28/22  Yes [provider]  senna (SENOKOT) 8.6 MG tablet Take 1 tablet by mouth at bedtime as needed for constipation.   Yes [provider]  valsartan  (DIOVAN ) 40 MG tablet Take 40 mg by mouth daily.   Yes [provider]    Allergies: Patient has no known allergies.    Review of Systems  Updated Vital Signs BP 125/63   Pulse 65   Temp (!) 97.1 F (36.2 C) (Temporal)   Resp 15   Ht 5' 2 (1.575 m)   Wt 62.6 kg   SpO2 92%   BMI 25.24 kg/m   Physical Exam Vitals and nursing note reviewed.  Constitutional:      General: She is not in acute distress.    Appearance: She is well-developed.  HENT:     Head: Normocephalic and atraumatic.  Eyes:     Conjunctiva/sclera: Conjunctivae normal.  Cardiovascular:     Rate and Rhythm: Normal rate and regular rhythm.     Heart sounds: No murmur heard. Pulmonary:     Effort: Pulmonary effort is normal. No respiratory distress.     Breath sounds: Normal breath sounds.  Abdominal:     Palpations: Abdomen is soft.     Tenderness: There is no abdominal tenderness. There is no guarding or rebound.  Musculoskeletal:        General: No swelling.     Cervical back: Neck supple.  Skin:    General: Skin is warm and dry.     Capillary Refill: Capillary refill takes less than 2 seconds.  Neurological:      General: No focal deficit present.     Mental Status: She is alert and oriented to person, place, and time.  Psychiatric:        Mood and Affect: Mood normal.     (all labs ordered are listed, but only abnormal results are displayed) Labs Reviewed  COMPREHENSIVE METABOLIC PANEL WITH GFR - Abnormal; Notable for the following components:      Result Value   Glucose, Bld 110 (*)    BUN 51 (*)    Creatinine, Ser 3.14 (*)    Albumin 3.4 (*)    GFR, Estimated 14 (*)    All other components within normal limits  CBC - Abnormal; Notable for the following components:   RBC 3.31 (*)    Hemoglobin 9.2 (*)    HCT 29.3 (*)    All other components within normal limits  URINALYSIS, ROUTINE W REFLEX MICROSCOPIC - Abnormal; Notable for the following components:   APPearance HAZY (*)    Leukocytes,Ua LARGE (*)    Bacteria, UA FEW (*)    All other components within normal limits  CULTURE, BLOOD (ROUTINE X 2)  CULTURE, BLOOD (ROUTINE X 2)  URINE CULTURE  LACTIC ACID, PLASMA  LACTIC ACID, PLASMA  CBG MONITORING, ED    EKG: EKG Interpretation Date/Time:  Wednesday February 20 2024 13:48:30 EDT Ventricular Rate:  65 PR Interval:  66 QRS Duration:  102 QT Interval:  446 QTC Calculation: 464 R Axis:   62  Text Interpretation: Sinus rhythm Atrial premature complex Short PR interval Baseline artifact Minimal ST depression, lateral leads Confirmed by Gennaro Bouchard ([id number]) on 02/20/2024 4:27:21 PM  Radiology: CT Head Wo Contrast Result Date: 02/20/2024 CLINICAL DATA:  Altered mental status. EXAM: CT HEAD WITHOUT CONTRAST TECHNIQUE: Contiguous axial images were obtained from the base of the skull through the vertex without intravenous contrast. RADIATION DOSE REDUCTION: This exam was performed according to the departmental dose-optimization program which includes automated exposure control, adjustment of the mA and/or kV according to patient size and/or use of iterative reconstruction  technique. COMPARISON:  May 04, 2023 FINDINGS: Brain: There is generalized cerebral atrophy with widening of the extra-axial spaces and ventricular dilatation. There are areas of decreased attenuation within the white matter tracts of the supratentorial brain, consistent with microvascular disease changes. Left basal ganglia, left temporal lobe, left frontal lobe and left parietal lobe encephalomalacia is seen within the left MCA territory. A chronic right basal ganglia lacunar infarct is noted. Vascular: Marked severity bilateral cavernous carotid artery calcification is noted. Skull: Normal. Negative for fracture or focal lesion. Sinuses/Orbits: A 10 mm posterior right maxillary sinus polyp versus mucous retention cyst is seen. Other: None. IMPRESSION: 1. Generalized cerebral atrophy with chronic white matter small vessel  ischemic changes. 2. Chronic left MCA territory infarct. 3. Chronic right basal ganglia lacunar infarct. 4. No acute intracranial abnormality. Electronically Signed   By: Suzen Dials M.D.   On: 02/20/2024 15:52     Procedures   Medications Ordered in the ED  cefTRIAXone  (ROCEPHIN ) 1 g in sodium chloride  0.9 % 100 mL IVPB (0 g Intravenous Stopped 02/20/24 1620)  lactated ringers  bolus 1,000 mL (1,000 mLs Intravenous New Bag/Given 02/20/24 1620)                                    Medical Decision Making This patient presents to the ED for concern of generalized weakness, this involves an extensive number of treatment options, and is a complaint that carries with it a high risk of complications and morbidity.  The differential diagnosis includes dehydration, UTI, metabolic encephalopathy, electrolyte derangement, intracranial hemorrhage, CVA, other   Co morbidities that complicate the patient evaluation :   CVA with residual expressive aphasia and right-sided weakness   Additional history obtained:  Additional history obtained from EMR External records from outside  source obtained and reviewed including prior notes, labs, prior urine cultures   Lab Tests:  I Ordered, and personally interpreted labs.  The pertinent results include: CBC with chronic anemia no leukocytosis, CMP with increase in baseline BUN and creatinine   Imaging Studies ordered:  I ordered imaging studies including CT head which shows chronic infarctions, no hemorrhage or mass effect I independently visualized and interpreted imaging within scope of identifying emergent findings  I agree with the radiologist interpretation   Cardiac Monitoring: / EKG:  The patient was maintained on a cardiac monitor.  I personally viewed and interpreted the cardiac monitored which showed an underlying rhythm of: Sinus rhythm   Consultations Obtained:  I requested consultation with the hospitalist Dr. Pearlean,  and discussed lab and imaging findings as well as pertinent plan - they recommend: Admission   Problem List / ED Course / Critical interventions / Medication management  Patient with generalized weakness today-she is ambulatory at baseline.  Not acting like herself per nursing home staff, did not want to attend a group ice cream social which is out of her usual.  On exam she appears nontoxic but has some generalized weakness with no focal findings, she has no abdominal tenderness, initially had slightly low diastolic blood pressure, improved with IV fluids, labs show AKI with UTI.  She has not had a positive urine culture in the past couple of years for guidance, several years ago she had ESBL E. coli, will give Rocephin , IV fluids and admit to the hospital. I ordered medication including IV fluids for dehydration Reevaluation of the patient after these medicines showed that the patient improved I have reviewed the patients home medicines and have made adjustments as needed   Social Determinants of Health:  Patient lives in a nursing home       Amount and/or Complexity of Data  Reviewed Independent Historian:     Details: Step-daughter  Labs: ordered. Radiology: ordered.  Risk Decision regarding hospitalization.        Final diagnoses:  None    ED Discharge Orders     None          Suellen Sherran DELENA DEVONNA 02/20/24 1636    Suzette Pac, MD 02/21/24 [phone]

## 2024-02-20 NOTE — ED Notes (Signed)
 Pts Daughter Randine [facility]) updated on Pt admission to the hospital.

## 2024-02-21 ENCOUNTER — Telehealth: Payer: Self-pay | Admitting: Family Medicine

## 2024-02-21 DIAGNOSIS — G9341 Metabolic encephalopathy: Secondary | ICD-10-CM | POA: Diagnosis not present

## 2024-02-21 MED ORDER — LACTATED RINGERS IV SOLN: INTRAVENOUS | Status: DC

## 2024-02-21 NOTE — Telephone Encounter (Signed)
 Erroneous encounter. Please disregard.

## 2024-02-21 NOTE — TOC Initial Note (Signed)
 Transition of Care [facility]) - Initial/Assessment Note   Patient Details  Name: [name] MRN: [id number] Date of Birth: 21-Jun-1934  Transition of Care [facility]) CM/SW Contact:    Nena LITTIE Coffee, RN Phone Number: 02/21/2024, 12:56 PM  Clinical Narrative:                 Pt is from [facility] in [place]. Per staff at [facility] pt is very hard of hearing and has difficulty c/speech r/t past CVA. She is ambulatory c/a walker and needs assistance c/showers and toileting.  [facility] will accept her back at dc.   Expected Discharge Plan: Long Term Nursing Home Barriers to Discharge: Continued Medical Work up  Patient Goals and CMS Choice   Expected Discharge Plan and Services In-house Referral: Clinical Social Work Discharge Planning Services: CM Consult Post Acute Care Choice: Nursing Home Living arrangements for the past 2 months: Assisted Living Facility       Prior Living Arrangements/Services Living arrangements for the past 2 months: Assisted Living Facility Lives with:: Facility Resident Patient language and need for interpreter reviewed:: Yes Do you feel safe going back to the place where you live?: Yes      Need for Family Participation in Patient Care: Yes (Comment) Care giver support system in place?: Yes (comment) Current home services: DME (walker) Criminal Activity/Legal Involvement Pertinent to Current Situation/Hospitalization: No - Comment as needed  Activities of Daily Living   Permission Sought/Granted    Emotional Assessment Appearance:: Appears stated age Attitude/Demeanor/Rapport: Other (comment) (somnolent) Affect (typically observed): Calm   Alcohol / Substance Use: Not Applicable Psych Involvement: No (comment)  Admission diagnosis:  Metabolic encephalopathy [G93.41] Urinary tract infection without hematuria, site unspecified [N39.0] Acute metabolic encephalopathy [G93.41] Patient Active Problem List   Diagnosis Date Noted    Acute metabolic encephalopathy 02/20/2024   Sepsis due to Escherichia coli (E. coli) -from urinary source--POA 12/21/2020   Hypoalbuminemia due to protein-calorie malnutrition (HCC) 12/20/2020   Obesity (BMI 30.0-34.9) 12/20/2020   CKD (chronic kidney disease), stage III (HCC) 12/20/2020   Prolonged QT interval 12/20/2020   Hyperlipidemia 12/20/2020   Presbycusis of both ears 09/28/2020   Impacted cerumen of left ear 09/28/2020   Schatzki's ring 05/08/2018   PUD (peptic ulcer disease) 05/08/2018   Duodenal ulcer 04/29/2018   Acute kidney injury superimposed on CKD (HCC) 04/26/2018   Dehydration 04/26/2018   Anemia, unspecified 04/26/2018   Altered mental state 04/26/2018   Leukocytosis 04/26/2018   Cerebrovascular disease 04/26/2018   Hypokalemia 04/26/2018   Painful orthopaedic hardware (HCC) 10/05/2014   Right hip pain 10/05/2014   Expressive aphasia    Acute blood loss anemia    Acute renal failure syndrome (HCC)    Closed right hip fracture (HCC) 06/12/2014   Hip fracture (HCC) 06/12/2014   UTI (urinary tract infection) 06/12/2014   Fall    Vasovagal syncope 05/15/2011   History of CVA (cerebrovascular accident) 05/15/2011   Hypertension 05/15/2011   LOWER LEG, ARTHRITIS, DEGEN./OSTEO 01/21/2009   JOINT EFFUSION, RIGHT KNEE 01/21/2009   KNEE PAIN 01/21/2009   PCP:  Duanne Butler DASEN, MD Pharmacy:   [facility] [phone] - [place], [place] - [address] AT [facility] [address] [place] [address] Phone: [phone] Fax: [phone]  Social Drivers of Health (SDOH) Social History: SDOH Screenings   Food Insecurity: Patient Unable To Answer (02/21/2024)  Housing: Patient Unable To Answer (02/21/2024)  Transportation Needs: Patient Unable To  Answer (02/21/2024)  Utilities: Patient Unable To Answer (02/21/2024)  Alcohol Screen: Low Risk  (01/10/2024)  Depression (PHQ2-9): Low Risk  (01/10/2024)  Financial Resource Strain: Low Risk   (01/10/2024)  Physical Activity: Insufficiently Active (01/10/2024)  Social Connections: Patient Unable To Answer (02/21/2024)  Recent Concern: Social Connections - Moderately Isolated (01/10/2024)  Stress: No Stress Concern Present (01/10/2024)  Tobacco Use: Medium Risk (02/20/2024)  Health Literacy: Inadequate Health Literacy (01/10/2024)   SDOH Interventions:  Readmission Risk Interventions    02/21/2024   12:48 PM  Readmission Risk Prevention Plan  Transportation Screening Complete  PCP or Specialist Appt within 5-7 Days Complete  Home Care Screening Complete  Medication Review (RN CM) Complete

## 2024-02-21 NOTE — Plan of Care (Signed)
°  Problem: Clinical Measurements: Goal: Ability to maintain clinical measurements within normal limits will improve Outcome: Progressing Goal: Will remain free from infection Outcome: Progressing Goal: Diagnostic test results will improve Outcome: Progressing Goal: Respiratory complications will improve Outcome: Progressing Goal: Cardiovascular complication will be avoided Outcome: Progressing   Problem: Coping: Goal: Level of anxiety will decrease Outcome: Progressing   Problem: Activity: Goal: Risk for activity intolerance will decrease Outcome: Progressing

## 2024-02-21 NOTE — Plan of Care (Signed)
   Problem: Education: Goal: Knowledge of General Education information will improve Description: Including pain rating scale, medication(s)/side effects and non-pharmacologic comfort measures Outcome: Not Progressing   Problem: Health Behavior/Discharge Planning: Goal: Ability to manage health-related needs will improve Outcome: Not Progressing

## 2024-02-21 NOTE — Progress Notes (Signed)
 PROGRESS NOTE    Patient: [name]                            PCP: Duanne Butler DASEN, MD                    DOB: 23-Mar-1935            DOA: 02/20/2024 FMW:[id number]             DOS: 02/21/2024, 1:09 PM   LOS: 1 day   Date of Service: The patient was seen and examined on 02/21/2024  Subjective:   The patient was seen and examined this morning. Hemodynamically stable. No issues overnight .  Brief Narrative:    [name] is a 89 y.o. female with medical history significant for CKD 3, hypertension, stroke right-sided deficits and aphasia, peptic ulcer disease. Patient was brought to the ED from nursing home with reports of generalized weakness, and not participating in activities as she would normally do-this was unusual for her.  Daughter reported that patient has aphasia, and this was usually consistent with her UTI infections.  On my evaluation, patient is awake and alert and talking but speech is incomprehensible.   ED Course: Temperature 97.1.  Heart rate 59-70, respiratory 12-17.  Blood pressure systolic 111-130.  O2 sats greater than 92% on room air. UA suggestive of UTI.  Head CT without contrast generalized cerebral atrophy with chronic white matter small ischemic vessel changes. IV ceftriaxone , 1 L bolus given.  Blood cultures obtained.      Assessment & Plan:   Principal Problem:   Acute metabolic encephalopathy Active Problems:   Acute kidney injury superimposed on CKD (HCC)   History of CVA (cerebrovascular accident)   Hypertension   Expressive aphasia   PUD (peptic ulcer disease)     Assessment and Plan: * Acute toxic metabolic encephalopathy -Hemodynamically stable, satting 97% on room air -Toxic metabolic encephalopathy superimposed with UTI infection and underlying dementia, complicated by aphasia due to her stroke -Patient is hard of hearing, remain confused-mumbles her words unable to communicate - Laying in bed comfortably, moving  extremities   Presenting with change in behavior, generalized weakness. Aphasia right-sided deficits from prior stroke.   UA suggestive of UTI.   Head CT negative for acute abnormality.  Afebrile.  WBC 4.8.  Lactic acid 0.7. - Continue with IV ceftriaxone  1 g daily -Follow-up blood and urine cultures - Chest x-ray mild vascular congestion, negative for any infiltrate  Acute kidney injury superimposed on CKD (HCC) Creatinine 3.14, recent baseline 2.4-2.5.  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 - Hold Valsartan  - 1 L bolus given, continue LR 75cc/hr x 20hrs - Will follow-up with labs  Hypertension Stable. -Resume Norvasc , carvedilol , hydralazine  - Hold valsartan  with AKI  History of CVA (cerebrovascular accident)-with aphasia With baseline aphasia and right-sided deficits.  Nursing home resident. - Resume Plavix , Crestor       --------------------------------------------------------------------------------------------------------------------------------------------- Nutritional status:  The patient's BMI is: Body mass index is 24.8 kg/m. I agree with the assessment and plan as outlined ------------------------------------------------------------------------------------------------------------------------------------------ Cultures; Blood Cultures x 2 >> Urine Culture  >>>  ------------------------------------------------------------------------------------------------------------------------------------------  DVT prophylaxis:  heparin  injection 5,000 Units Start: 02/20/24 2200   Code Status:   Code Status: Full Code  Family Communication: No family member present at bedside-  -Advance care planning has been discussed.   Admission status:   Status is: Inpatient Remains inpatient appropriate because: Needing IV  fluid, IV antibiotics   Disposition: From  - home             Planning for discharge in 1-2 days   Procedures:   No admission procedures for hospital  encounter.   Antimicrobials:  Anti-infectives (From admission, onward)    Start     Dose/Rate Route Frequency Ordered Stop   02/21/24 1500  cefTRIAXone  (ROCEPHIN ) 1 g in sodium chloride  0.9 % 100 mL IVPB        1 g 200 mL/hr over 30 Minutes Intravenous Every 24 hours 02/20/24 1825     02/20/24 1545  cefTRIAXone  (ROCEPHIN ) 1 g in sodium chloride  0.9 % 100 mL IVPB        1 g 200 mL/hr over 30 Minutes Intravenous  Once 02/20/24 1536 02/20/24 1620        Medication:   amLODipine   10 mg Oral Daily   carvedilol   25 mg Oral BID WC   clopidogrel   75 mg Oral Daily   heparin   5,000 Units Subcutaneous Q8H   hydrALAZINE   50 mg Oral TID   pantoprazole   40 mg Oral Daily   rosuvastatin   10 mg Oral QHS    acetaminophen  **OR** acetaminophen , ondansetron  **OR** ondansetron  (ZOFRAN ) IV, polyethylene glycol   Objective:   Vitals:   02/20/24 2322 02/21/24 0417 02/21/24 0813 02/21/24 0928  BP: 113/72 (!) 135/58 (!) 143/63 (!) 143/63  Pulse: 73 75 76   Resp: 16 16 20    Temp: (!) 97.5 F (36.4 C) (!) 97.5 F (36.4 C) 97.7 F (36.5 C)   TempSrc: Oral Oral Oral   SpO2: 97% 95% 96%   Weight:      Height:        Intake/Output Summary (Last 24 hours) at 02/21/2024 1309 Last data filed at 02/21/2024 0259 Gross per 24 hour  Intake 1214.59 ml  Output --  Net 1214.59 ml   Filed Weights   02/20/24 1146 02/20/24 2014  Weight: 62.6 kg 61.5 kg     Physical examination:   General:  Confused, but awake, comfortable   HEENT:  Hard of hearing, normocephalic, PERRL, otherwise with in Normal limits   Neuro:  Limited exam patient is confused, hard of hearing CNII-XII intact. , normal motor and sensation, reflexes intact   Lungs:   Clear to auscultation BL, Respirations unlabored,  No wheezes / crackles  Cardio:    S1/S2, RRR, No murmure, No Rubs or Gallops   Abdomen:  Soft, non-tender, bowel sounds active all four quadrants, no guarding or peritoneal signs.  Muscular  skeletal:  Limited  exam -global generalized weaknesses - in bed, able to move all 4 extremities,   2+ pulses,  symmetric, No pitting edema  Skin:  Dry, warm to touch, negative for any Rashes,  Wounds: Please see nursing documentation       ------------------------------------------------------------------------------------------------------------------------------------------    LABs:     Latest Ref Rng & Units 02/20/2024    2:01 PM 08/23/2023    8:55 AM 08/06/2023    9:17 AM  CBC  WBC 4.0 - 10.5 K/uL 4.8  6.7  5.6   Hemoglobin 12.0 - 15.0 g/dL 9.2  89.0  89.5   Hematocrit 36.0 - 46.0 % 29.3  35.1  35.0   Platelets 150 - 400 K/uL 167  243  227       Latest Ref Rng & Units 02/20/2024    2:01 PM 08/23/2023    8:55 AM 08/06/2023    9:17 AM  CMP  Glucose 70 - 99 mg/dL 889  898  865   BUN 8 - 23 mg/dL 51  31  30   Creatinine 0.44 - 1.00 mg/dL 6.85  7.57  7.49   Sodium 135 - 145 mmol/L 144  140  143   Potassium 3.5 - 5.1 mmol/L 3.9  4.4  4.5   Chloride 98 - 111 mmol/L 108  106  108   CO2 22 - 32 mmol/L 23  26  24    Calcium  8.9 - 10.3 mg/dL 9.1  8.9  8.6   Total Protein 6.5 - 8.1 g/dL 6.5  6.1  6.1   Total Bilirubin 0.0 - 1.2 mg/dL 0.8  0.6  0.5   Alkaline Phos 38 - 126 U/L 106     AST 15 - 41 U/L 16  12  14    ALT 0 - 44 U/L 13  7  9         Micro Results Recent Results (from the past 240 hours)  Blood culture (routine x 2)     Status: None (Preliminary result)   Collection Time: 02/20/24  2:24 PM   Specimen: BLOOD  Result Value Ref Range Status   Specimen Description BLOOD BLOOD LEFT ARM  Final   Special Requests   Final    BOTTLES DRAWN AEROBIC AND ANAEROBIC Blood Culture adequate volume   Culture   Final    NO GROWTH < 24 HOURS Performed at [facility], [address] [place], [place] [address]    Report Status PENDING  Incomplete  Blood culture (routine x 2)     Status: None (Preliminary result)   Collection Time: 02/20/24  2:24 PM   Specimen: BLOOD  Result Value Ref Range Status    Specimen Description BLOOD BLOOD RIGHT HAND  Final   Special Requests   Final    BOTTLES DRAWN AEROBIC ONLY Blood Culture adequate volume   Culture   Final    NO GROWTH < 24 HOURS Performed at [facility], [address] [place], [place] [address]    Report Status PENDING  Incomplete    Radiology Reports DG CHEST PORT 1 VIEW Result Date: 02/20/2024 CLINICAL DATA:  Altered mental status EXAM: PORTABLE CHEST 1 VIEW COMPARISON:  12/19/2020 FINDINGS: Cardiac shadow is enlarged. Aortic calcifications are seen. The lungs are well aerated bilaterally. Mild central vascular congestion is noted without edema. No focal confluent infiltrate is seen. No bony abnormality is noted. IMPRESSION: Mild central vascular congestion is noted. Electronically Signed   By: Oneil Devonshire M.D.   On: 02/20/2024 19:05   CT Head Wo Contrast Result Date: 02/20/2024 CLINICAL DATA:  Altered mental status. EXAM: CT HEAD WITHOUT CONTRAST TECHNIQUE: Contiguous axial images were obtained from the base of the skull through the vertex without intravenous contrast. RADIATION DOSE REDUCTION: This exam was performed according to the departmental dose-optimization program which includes automated exposure control, adjustment of the mA and/or kV according to patient size and/or use of iterative reconstruction technique. COMPARISON:  May 04, 2023 FINDINGS: Brain: There is generalized cerebral atrophy with widening of the extra-axial spaces and ventricular dilatation. There are areas of decreased attenuation within the white matter tracts of the supratentorial brain, consistent with microvascular disease changes. Left basal ganglia, left temporal lobe, left frontal lobe and left parietal lobe encephalomalacia is seen within the left MCA territory. A chronic right basal ganglia lacunar infarct is noted. Vascular: Marked severity bilateral cavernous carotid artery calcification is noted. Skull: Normal. Negative for  fracture or focal  lesion. Sinuses/Orbits: A 10 mm posterior right maxillary sinus polyp versus mucous retention cyst is seen. Other: None. IMPRESSION: 1. Generalized cerebral atrophy with chronic white matter small vessel ischemic changes. 2. Chronic left MCA territory infarct. 3. Chronic right basal ganglia lacunar infarct. 4. No acute intracranial abnormality. Electronically Signed   By: Suzen Dials M.D.   On: 02/20/2024 15:52    SIGNED: Adriana DELENA Grams, MD, FHM. FAAFP. Jolynn Pack - [facility] Time spent - 55 min.  In seeing, evaluating and examining the patient. Reviewing medical records, labs, drawn plan of care. [facility],  Pager (please use [email or web address] to page/ text) Please use Epic Secure Chat for non-urgent communication (7AM-7PM)  If 7PM-7AM, please contact night-coverage [email or web address], 02/21/2024, 1:09 PM

## 2024-02-21 NOTE — Hospital Course (Addendum)
 [name] is a 89 y.o. female with medical history significant for CKD 3, hypertension, stroke right-sided deficits and aphasia, peptic ulcer disease. Patient was brought to the ED from nursing home with reports of generalized weakness, and not participating in activities as she would normally do-this was unusual for her.  Daughter reported that patient has aphasia, and this was usually consistent with her UTI infections.  On my evaluation, patient is awake and alert and talking but speech is incomprehensible.   ED Course: Temperature 97.1.  Heart rate 59-70, respiratory 12-17.  Blood pressure systolic 111-130.  O2 sats greater than 92% on room air. UA suggestive of UTI.  Head CT without contrast generalized cerebral atrophy with chronic white matter small ischemic vessel changes. IV ceftriaxone , 1 L bolus given.  Blood cultures obtained.

## 2024-02-22 DIAGNOSIS — G9341 Metabolic encephalopathy: Secondary | ICD-10-CM | POA: Diagnosis not present

## 2024-02-22 LAB — CBC
HCT: 29.2 % — ABNORMAL LOW (ref 36.0–46.0)
Hemoglobin: 9.3 g/dL — ABNORMAL LOW (ref 12.0–15.0)
MCH: 27.9 pg (ref 26.0–34.0)
MCHC: 31.8 g/dL (ref 30.0–36.0)
MCV: 87.7 fL (ref 80.0–100.0)
Platelets: 167 K/uL (ref 150–400)
RBC: 3.33 MIL/uL — ABNORMAL LOW (ref 3.87–5.11)
RDW: 13.7 % (ref 11.5–15.5)
WBC: 6.1 K/uL (ref 4.0–10.5)
nRBC: 0 % (ref 0.0–0.2)

## 2024-02-22 LAB — BASIC METABOLIC PANEL WITH GFR
Anion gap: 11 (ref 5–15)
BUN: 33 mg/dL — ABNORMAL HIGH (ref 8–23)
CO2: 21 mmol/L — ABNORMAL LOW (ref 22–32)
Calcium: 8.3 mg/dL — ABNORMAL LOW (ref 8.9–10.3)
Chloride: 112 mmol/L — ABNORMAL HIGH (ref 98–111)
Creatinine, Ser: 2.47 mg/dL — ABNORMAL HIGH (ref 0.44–1.00)
GFR, Estimated: 19 mL/min — ABNORMAL LOW (ref >60)
Glucose, Bld: 83 mg/dL (ref 70–99)
Potassium: 4.1 mmol/L (ref 3.5–5.1)
Sodium: 144 mmol/L (ref 135–145)

## 2024-02-22 MED ORDER — LACTATED RINGERS IV SOLN: INTRAVENOUS | Status: AC

## 2024-02-22 NOTE — Plan of Care (Signed)
  Problem: Acute Rehab PT Goals(only PT should resolve) Goal: Pt Will Go Supine/Side To Sit Outcome: Progressing Flowsheets (Taken 02/22/2024 1432) Pt will go Supine/Side to Sit: with supervision Goal: Patient Will Transfer Sit To/From Stand Outcome: Progressing Flowsheets (Taken 02/22/2024 1432) Patient will transfer sit to/from stand: with supervision Goal: Pt Will Ambulate Outcome: Progressing Flowsheets (Taken 02/22/2024 1432) Pt will Ambulate:  25 feet  with supervision  with rolling walker   Lacinda Fass, PT, DPT

## 2024-02-22 NOTE — Progress Notes (Signed)
  Contacted patient's POA: Stepdaughter [name] Her acute on chronic medical issues were discussed in detail.  Plan of care was discussed in detail Patient baseline mental status, mobility were confirmed-based on her previous stroke dysphagia and limited mobility recurrent UTIs.  Also CODE STATUS was discussed in detail patient is confirmed DNR/DNI CODE STATUS was changed from full code to DNR/DNI  Will change the orders accordingly We will continue to treat urinary tract infection, will assess with PT OT.  Disposition: Back to SNF facility once medically stable likely Monday, 02/25/2024.    SIGNED: Adriana DELENA Grams, MD, FHM. FAAFP [facility],  Pager (please use [email or web address] to page/text)  Please use Epic Secure Chat for non-urgent communication (7AM-7PM) If 7PM-7AM, please contact night-coverage [email or web address],  02/22/2024, 1:55 PM

## 2024-02-22 NOTE — Evaluation (Signed)
 Physical Therapy Evaluation Patient Details Name: [name] MRN: [id number] DOB: 15-Dec-1934 Today's Date: 02/22/2024  History of Present Illness  [name] is a 89 y.o. female with medical history significant for CKD 3, hypertension, stroke right-sided deficits and aphasia, peptic ulcer disease.  Patient was brought to the ED from nursing home with reports of generalized weakness, and not participating in activities as she would normally do-this was unusual for her.  Daughter reported that patient has aphasia, and this was usually consistent with her UTI infections.  On my evaluation, patient is awake and alert and talking but speech is incomprehensible.   Clinical Impression  Pt exhibited cognitive deficits and impaired communication, at baseline. She was able to transfers from sitting to standing, bed mobility, and ambulation with minA. She required the use of the RW for safety to ambulate and complete a STS transfers. She was left in bed with the call bell within reach, bed alarm set, and the RN and LPN present. Patient will benefit from continued skilled physical therapy in hospital and recommended venue below to increase strength, balance, endurance for safe ADLs and gait.         If plan is discharge home, recommend the following: A little help with walking and/or transfers;A lot of help with bathing/dressing/bathroom;Assistance with cooking/housework;Assist for transportation;Help with stairs or ramp for entrance   Can travel by private vehicle        Equipment Recommendations None recommended by PT  Recommendations for Other Services       Functional Status Assessment Patient has had a recent decline in their functional status and demonstrates the ability to make significant improvements in function in a reasonable and predictable amount of time.     Precautions / Restrictions Precautions Precautions: Fall Recall of Precautions/Restrictions:  Impaired Restrictions Weight Bearing Restrictions Per Provider Order: No      Mobility  Bed Mobility Overal bed mobility: Needs Assistance Bed Mobility: Sit to Supine       Sit to supine: Min assist, HOB elevated        Transfers Overall transfer level: Needs assistance Equipment used: Rolling walker (2 wheels) Transfers: Sit to/from Stand Sit to Stand: Min assist                Ambulation/Gait Ambulation/Gait assistance: Min assist Gait Distance (Feet): 8 Feet Assistive device: Rolling walker (2 wheels) Gait Pattern/deviations: Step-through pattern, Decreased stride length Gait velocity: slow        Stairs            Wheelchair Mobility     Tilt Bed    Modified Rankin (Stroke Patients Only)       Balance Overall balance assessment: Needs assistance Sitting-balance support: Feet supported, No upper extremity supported Sitting balance-Leahy Scale: Fair Sitting balance - Comments: seated EOB   Standing balance support: Bilateral upper extremity supported, During functional activity, Reliant on assistive device for balance Standing balance-Leahy Scale: Poor Standing balance comment: with RW                             Pertinent Vitals/Pain Pain Assessment Pain Assessment: Faces Faces Pain Scale: No hurt    Home Living Family/patient expects to be discharged to:: Assisted living                        Prior Function Prior Level of Function : Needs assist  Physical Assist : ADLs (physical)   ADLs (physical): Bathing;Toileting Mobility Comments: Pt ambulates with RW within assisted living facility, per facility ADLs Comments: Pt required assistance with bathing and toileting, per facility     Extremity/Trunk Assessment   Upper Extremity Assessment Upper Extremity Assessment: Overall WFL for tasks assessed    Lower Extremity Assessment Lower Extremity Assessment: Generalized weakness    Cervical /  Trunk Assessment Cervical / Trunk Assessment: Kyphotic  Communication   Communication Communication: Impaired Factors Affecting Communication: Hearing impaired;Difficulty expressing self;Reduced clarity of speech    Cognition Arousal: Alert Behavior During Therapy: WFL for tasks assessed/performed   PT - Cognitive impairments: History of cognitive impairments, Difficult to assess Difficult to assess due to: Hard of hearing/deaf, Impaired communication                       Following commands: Intact       Cueing Cueing Techniques: Verbal cues, Gestural cues, Tactile cues, Visual cues     General Comments      Exercises     Assessment/Plan    PT Assessment Patient needs continued PT services  PT Problem List Decreased strength;Decreased activity tolerance;Decreased balance;Decreased mobility       PT Treatment Interventions DME instruction;Gait training;Functional mobility training;Therapeutic activities;Therapeutic exercise;Balance training;Patient/family education    PT Goals (Current goals can be found in the Care Plan section)  Acute Rehab PT Goals Patient Stated Goal: none stated PT Goal Formulation: Patient unable to participate in goal setting    Frequency Min 3X/week     Co-evaluation               AM-PAC PT 6 Clicks Mobility  Outcome Measure Help needed turning from your back to your side while in a flat bed without using bedrails?: A Little Help needed moving from lying on your back to sitting on the side of a flat bed without using bedrails?: A Little Help needed moving to and from a bed to a chair (including a wheelchair)?: A Little Help needed standing up from a chair using your arms (e.g., wheelchair or bedside chair)?: A Little Help needed to walk in hospital room?: A Little Help needed climbing 3-5 steps with a railing? : A Lot 6 Click Score: 17    End of Session   Activity Tolerance: Patient tolerated treatment well Patient  left: in bed;with call bell/phone within reach;with bed alarm set;with nursing/sitter in room Nurse Communication: Mobility status;Precautions PT Visit Diagnosis: Unsteadiness on feet (R26.81);Difficulty in walking, not elsewhere classified (R26.2);Muscle weakness (generalized) (M62.81)    Time: 8640-8588 PT Time Calculation (min) (ACUTE ONLY): 12 min   Charges:   PT Evaluation $PT Eval Low Complexity: 1 Low PT Treatments $Therapeutic Activity: 8-22 mins PT General Charges $$ ACUTE PT VISIT: 1 Visit         Lacinda Fass, PT, DPT  02/22/2024, 2:31 PM

## 2024-02-22 NOTE — Progress Notes (Signed)
 PROGRESS NOTE    Patient: [name]                            PCP: Duanne Butler DASEN, MD                    DOB: [birth year]            DOA: 02/20/2024 FMW:[id number]             DOS: 02/22/2024, 11:15 AM   LOS: 2 days   Date of Service: The patient was seen and examined on 02/22/2024  Subjective:   The patient was seen and examined this morning, hemodynamically stable, more awake, follows some commands, pleasantly confused. No signs of agitation.  Brief Narrative:    [name] is a 89 y.o. female with medical history significant for CKD 3, hypertension, stroke right-sided deficits and aphasia, peptic ulcer disease. Patient was brought to the ED from nursing home with reports of generalized weakness, and not participating in activities as she would normally do-this was unusual for her.  Daughter reported that patient has aphasia, and this was usually consistent with her UTI infections.  On my evaluation, patient is awake and alert and talking but speech is incomprehensible.   ED Course: Temperature 97.1.  Heart rate 59-70, respiratory 12-17.  Blood pressure systolic 111-130.  O2 sats greater than 92% on room air. UA suggestive of UTI.  Head CT without contrast generalized cerebral atrophy with chronic white matter small ischemic vessel changes. IV ceftriaxone , 1 L bolus given.  Blood cultures obtained.      Assessment & Plan:   Principal Problem:   Acute metabolic encephalopathy Active Problems:   Acute kidney injury superimposed on CKD (HCC)   History of CVA (cerebrovascular accident)   Hypertension   Expressive aphasia   PUD (peptic ulcer disease)     Assessment and Plan: * Acute toxic metabolic encephalopathy -  SIRS -with source of infection UTI -Remained hemodynamically stable, much more awake, following command today, satting 93% on room air -Remains pleasantly confused -Toxic metabolic encephalopathy superimposed with UTI infection and underlying  dementia, complicated by aphasia due to her stroke  - Laying in bed comfortably, moving extremities   -POA: Change in behavior, interaction, progressive generalized weakness Chronic aphasia right-sided deficits from prior stroke.   UA suggestive of UTI.   Lactic acid 0.7, WBC 4.8, 6.1, Head CT negative for acute abnormality.  - Continue with IV ceftriaxone  1 g daily -Urine cultures >>  - Chest x-ray mild vascular congestion, negative for any infiltrate  Acute kidney injury superimposed on CKD (HCC) Creatinine 3.14, recent baseline 2.4-2.5.  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 Improving - Hold Valsartan  -Avoid nephrotoxins - Continue with IV maintenance fluids BUN 51, 33, GFR 14, 19, Lab Results  Component Value Date   CREATININE 2.47 (H) 02/22/2024   CREATININE 3.14 (H) 02/20/2024   CREATININE 2.42 (H) 08/23/2023     Hypertension Remains stable -Resume Norvasc , carvedilol , hydralazine  - Hold valsartan  with AKI  History of CVA (cerebrovascular accident)-with aphasia With baseline aphasia and right-sided deficits.  Nursing home resident. - Resume Plavix , Crestor   Ethics: Full code-consulting palliative care team to determine goals of care and CODE STATUS  Prognosis remain poor due to acute infection-multiple comorbidities including stroke, aphasia, immobility, poor p.o. intake   --------------------------------------------------------------------------------------------------------------------------------------------- Nutritional status:  The patient's BMI is: Body mass index is 24.8 kg/m. I agree with the assessment  and plan as outlined ------------------------------------------------------------------------------------------------------------------------------------------ Cultures; Blood Cultures x 2 >> Urine Culture  >>>  ------------------------------------------------------------------------------------------------------------------------------------------  DVT prophylaxis:   heparin  injection 5,000 Units Start: 02/20/24 2200   Code Status:   Code Status: Full Code  Family Communication: No family member present at bedside-  -Advance care planning has been discussed.   Admission status:   Status is: Inpatient Remains inpatient appropriate because: Needing IV fluid, IV antibiotics   Disposition: From  - home             Planning for discharge in 1-2 days   Procedures:   No admission procedures for hospital encounter.   Antimicrobials:  Anti-infectives (From admission, onward)    Start     Dose/Rate Route Frequency Ordered Stop   02/21/24 1500  cefTRIAXone  (ROCEPHIN ) 1 g in sodium chloride  0.9 % 100 mL IVPB        1 g 200 mL/hr over 30 Minutes Intravenous Every 24 hours 02/20/24 1825     02/20/24 1545  cefTRIAXone  (ROCEPHIN ) 1 g in sodium chloride  0.9 % 100 mL IVPB        1 g 200 mL/hr over 30 Minutes Intravenous  Once 02/20/24 1536 02/20/24 1620        Medication:   amLODipine   10 mg Oral Daily   carvedilol   25 mg Oral BID WC   clopidogrel   75 mg Oral Daily   heparin   5,000 Units Subcutaneous Q8H   hydrALAZINE   50 mg Oral TID   pantoprazole   40 mg Oral Daily   rosuvastatin   10 mg Oral QHS    acetaminophen  **OR** acetaminophen , ondansetron  **OR** ondansetron  (ZOFRAN ) IV, polyethylene glycol   Objective:   Vitals:   02/21/24 1950 02/22/24 0557 02/22/24 0842 02/22/24 0843  BP: (!) 131/51 (!) 134/51 (!) 131/50 (!) 131/50  Pulse: 76 82    Resp: 18 18    Temp: 98.7 F (37.1 C) 99.2 F (37.3 C)    TempSrc: Oral Oral    SpO2: 96% 93%    Weight:      Height:        Intake/Output Summary (Last 24 hours) at 02/22/2024 1115 Last data filed at 02/22/2024 0751 Gross per 24 hour  Intake 1242.14 ml  Output --  Net 1242.14 ml   Filed Weights   02/20/24 1146 02/20/24 2014  Weight: 62.6 kg 61.5 kg     Physical examination:        General:  Awake, pleasantly confused  HEENT:  Normocephalic, PERRL, otherwise with in Normal  limits   Neuro:  CNII-XII intact. , normal motor and sensation, reflexes intact   Lungs:   Clear to auscultation BL, Respirations unlabored,  No wheezes / crackles  Cardio:    S1/S2, RRR, No murmure, No Rubs or Gallops   Abdomen:  Soft, non-tender, bowel sounds active all four quadrants, no guarding or peritoneal signs.  Muscular  skeletal:  Limited exam -severe global generalized weaknesses Seems to be bedbound - in bed, able to move all 4 extremities,   2+ pulses,  symmetric, No pitting edema  Skin:  Dry, warm to touch, negative for any Rashes,  Wounds: Please see nursing documentation         ------------------------------------------------------------------------------------------------------------------------------------------    LABs:     Latest Ref Rng & Units 02/22/2024    4:57 AM 02/20/2024    2:01 PM 08/23/2023    8:55 AM  CBC  WBC 4.0 - 10.5 K/uL 6.1  4.8  6.7  Hemoglobin 12.0 - 15.0 g/dL 9.3  9.2  89.0   Hematocrit 36.0 - 46.0 % 29.2  29.3  35.1   Platelets 150 - 400 K/uL 167  167  243       Latest Ref Rng & Units 02/22/2024    4:57 AM 02/20/2024    2:01 PM 08/23/2023    8:55 AM  CMP  Glucose 70 - 99 mg/dL 83  889  898   BUN 8 - 23 mg/dL 33  51  31   Creatinine 0.44 - 1.00 mg/dL 7.52  6.85  7.57   Sodium 135 - 145 mmol/L 144  144  140   Potassium 3.5 - 5.1 mmol/L 4.1  3.9  4.4   Chloride 98 - 111 mmol/L 112  108  106   CO2 22 - 32 mmol/L 21  23  26    Calcium  8.9 - 10.3 mg/dL 8.3  9.1  8.9   Total Protein 6.5 - 8.1 g/dL  6.5  6.1   Total Bilirubin 0.0 - 1.2 mg/dL  0.8  0.6   Alkaline Phos 38 - 126 U/L  106    AST 15 - 41 U/L  16  12   ALT 0 - 44 U/L  13  7        Micro Results Recent Results (from the past 240 hours)  Urine Culture     Status: Abnormal (Preliminary result)   Collection Time: 02/20/24  1:34 PM   Specimen: Urine, Clean Catch  Result Value Ref Range Status   Specimen Description   Final    URINE, CLEAN CATCH Performed at [facility], [address] [place], [place] [address]    Special Requests   Final    NONE Performed at [facility], [address], [place], [place] [address]    Culture (A)  Final    >=100,000 COLONIES/mL GRAM NEGATIVE RODS IDENTIFICATION AND SUSCEPTIBILITIES TO FOLLOW Performed at [facility], [address] [place], [place] [address]    Report Status PENDING  Incomplete  Blood culture (routine x 2)     Status: None (Preliminary result)   Collection Time: 02/20/24  2:24 PM   Specimen: BLOOD  Result Value Ref Range Status   Specimen Description BLOOD BLOOD LEFT ARM  Final   Special Requests   Final    BOTTLES DRAWN AEROBIC AND ANAEROBIC Blood Culture adequate volume   Culture   Final    NO GROWTH 2 DAYS Performed at [facility], [address], [place], [place] [address]    Report Status PENDING  Incomplete  Blood culture (routine x 2)     Status: None (Preliminary result)   Collection Time: 02/20/24  2:24 PM   Specimen: BLOOD  Result Value Ref Range Status   Specimen Description BLOOD BLOOD RIGHT HAND  Final   Special Requests   Final    BOTTLES DRAWN AEROBIC ONLY Blood Culture adequate volume   Culture   Final    NO GROWTH 2 DAYS Performed at [facility], [address] [place], [place] [address]    Report Status PENDING  Incomplete    Radiology Reports No results found.   SIGNED: Adriana DELENA Grams, MD, FHM. FAAFP. Jol[facility] - [facility] Time spent - 55 min.  In seeing, evaluating and examining the patient. Reviewing medical records, labs, drawn plan of care. [facility],  Pager (please use [email or web address] to page/ text) Please use Epic Secure Chat for non-urgent communication (7AM-7PM)  If 7PM-7AM, please contact night-coverage [email or web address], 02/22/2024, 11:15 AM

## 2024-02-22 NOTE — Plan of Care (Signed)

## 2024-02-23 DIAGNOSIS — G9341 Metabolic encephalopathy: Secondary | ICD-10-CM | POA: Diagnosis not present

## 2024-02-23 LAB — CBC
HCT: 26.4 % — ABNORMAL LOW (ref 36.0–46.0)
Hemoglobin: 8.1 g/dL — ABNORMAL LOW (ref 12.0–15.0)
MCH: 27.2 pg (ref 26.0–34.0)
MCHC: 30.7 g/dL (ref 30.0–36.0)
MCV: 88.6 fL (ref 80.0–100.0)
Platelets: 133 K/uL — ABNORMAL LOW (ref 150–400)
RBC: 2.98 MIL/uL — ABNORMAL LOW (ref 3.87–5.11)
RDW: 13.6 % (ref 11.5–15.5)
WBC: 6 K/uL (ref 4.0–10.5)
nRBC: 0 % (ref 0.0–0.2)

## 2024-02-23 LAB — BASIC METABOLIC PANEL WITH GFR
Anion gap: 12 (ref 5–15)
BUN: 35 mg/dL — ABNORMAL HIGH (ref 8–23)
CO2: 22 mmol/L (ref 22–32)
Calcium: 8 mg/dL — ABNORMAL LOW (ref 8.9–10.3)
Chloride: 111 mmol/L (ref 98–111)
Creatinine, Ser: 2.52 mg/dL — ABNORMAL HIGH (ref 0.44–1.00)
GFR, Estimated: 18 mL/min — ABNORMAL LOW (ref >60)
Glucose, Bld: 83 mg/dL (ref 70–99)
Potassium: 4 mmol/L (ref 3.5–5.1)
Sodium: 145 mmol/L (ref 135–145)

## 2024-02-23 LAB — IRON AND TIBC
Iron: 26 ug/dL — ABNORMAL LOW (ref 28–170)
Saturation Ratios: 13 % (ref 10.4–31.8)
TIBC: 209 ug/dL — ABNORMAL LOW (ref 250–450)
UIBC: 183 ug/dL

## 2024-02-23 LAB — URINE CULTURE: Culture: >=100000 — AB

## 2024-02-23 LAB — VITAMIN B12: Vitamin B-12: 205 pg/mL (ref 180–914)

## 2024-02-23 MED ORDER — DOCUSATE SODIUM 100 MG PO CAPS
200.0000 mg | ORAL_CAPSULE | Freq: Every day | ORAL | Status: DC
  Administered 2024-02-23 – 2024-02-25 (×3): 200 mg via ORAL
  Filled 2024-02-23 (×3): qty 2

## 2024-02-23 MED ORDER — AMLODIPINE BESYLATE 5 MG PO TABS
5.0000 mg | ORAL_TABLET | Freq: Every day | ORAL | Status: DC
  Administered 2024-02-23 – 2024-02-25 (×3): 5 mg via ORAL
  Filled 2024-02-23 (×2): qty 1

## 2024-02-23 MED ORDER — LACTATED RINGERS IV SOLN: INTRAVENOUS | Status: DC

## 2024-02-23 MED ORDER — FERROUS SULFATE 325 (65 FE) MG PO TABS
325.0000 mg | ORAL_TABLET | Freq: Two times a day (BID) | ORAL | Status: DC
  Administered 2024-02-23 – 2024-02-25 (×5): 325 mg via ORAL
  Filled 2024-02-23 (×5): qty 1

## 2024-02-23 MED ORDER — HEPARIN SODIUM (PORCINE) 5000 UNIT/ML IJ SOLN
5000.0000 [IU] | Freq: Three times a day (TID) | INTRAMUSCULAR | Status: DC
  Administered 2024-02-24 – 2024-02-25 (×4): 5000 [IU] via SUBCUTANEOUS
  Filled 2024-02-23 (×4): qty 1

## 2024-02-23 NOTE — Plan of Care (Signed)

## 2024-02-23 NOTE — Progress Notes (Signed)
 PROGRESS NOTE    Patient: [name]                            PCP: Duanne Butler DASEN, MD                    DOB: [birth year]            DOA: 02/20/2024 FMW:[id number]             DOS: 02/23/2024, 11:10 AM   LOS: 3 days   Date of Service: The patient was seen and examined on 02/23/2024  Subjective:   Patient was seen and examined this morning, more awake, following some commands Speech remained garbled possibly at baseline, trying to verbalize her name Hemodynamically stable No issues overnight  Brief Narrative:    [name] is a 89 y.o. female with medical history significant for CKD 3, hypertension, stroke right-sided deficits and aphasia, peptic ulcer disease. Patient was brought to the ED from nursing home with reports of generalized weakness, and not participating in activities as she would normally do-this was unusual for her.  Daughter reported that patient has aphasia, and this was usually consistent with her UTI infections.  On my evaluation, patient is awake and alert and talking but speech is incomprehensible.   ED Course: Temperature 97.1.  Heart rate 59-70, respiratory 12-17.  Blood pressure systolic 111-130.  O2 sats greater than 92% on room air. UA suggestive of UTI.  Head CT without contrast generalized cerebral atrophy with chronic white matter small ischemic vessel changes. IV ceftriaxone , 1 L bolus given.  Blood cultures obtained.      Assessment & Plan:   Principal Problem:   Acute metabolic encephalopathy Active Problems:   Acute kidney injury superimposed on CKD (HCC)   History of CVA (cerebrovascular accident)   Hypertension   Expressive aphasia   PUD (peptic ulcer disease)     Assessment and Plan: * Acute toxic metabolic encephalopathy -  SIRS -with source of infection UTI Toxic metabolic encephalopathy superimposed with UTI infection and underlying dementia,  -Hemodynamically stable, mentation improving Discussed with POA,  patient has some ambulation, speech always garbled post her stroke-this is possibly her baseline  -Remains pleasantly confused  complicated by aphasia due to her stroke  - Laying in bed comfortably, moving extremities   -POA: Change in behavior, interaction, progressive generalized weakness Chronic aphasia right-sided deficits from prior stroke.   UA suggestive of UTI.   Lactic acid 0.7, WBC 4.8, 6.1, Head CT negative for acute abnormality.   - Continue with IV ceftriaxone  1 g daily>>> switching to p.o. Keflex  02/23/2024 -Urine cultures >> Klebsiella (resistant to ampicillin otherwise pansensitive)  - Chest x-ray mild vascular congestion, negative for any infiltrate  Acute kidney injury superimposed on CKD (HCC) Creatinine 3.14, recent baseline 2.4-2.5.  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 -Worsening kidney function Monitoring - Hold Valsartan  -Avoid nephrotoxins - Continue with IV maintenance fluids BUN 51, 33, GFR 14, 19, Lab Results  Component Value Date   CREATININE 2.52 (H) 02/23/2024   CREATININE 2.47 (H) 02/22/2024   CREATININE 3.14 (H) 02/20/2024     Hypertension Remains stable -Resume reducing Norvasc  to 5 mg daily, continue Coreg  at 25 mg discontinuing hydralazine  - Hold valsartan  with AKI  History of CVA (cerebrovascular accident)-with aphasia With baseline aphasia and right-sided deficits.  Nursing home resident. - Resume Plavix , Crestor   Acute on chronic anemia -anemia of chronic disease  -  Monitoring H&H closely  - Mild drop in hemoglobin noted - Currently stable, will transfuse if Hgb <7.0   Ethics: DNR/DNI- Called and discussed with POA [name], confirmed CODE STATUS, also updated regarding her medical issues, confirming baseline function.  And goals of care were discussed in detail.  Prognosis remain poor due to acute infection-multiple comorbidities including stroke, aphasia, immobility, poor p.o.  intake   --------------------------------------------------------------------------------------------------------------------------------------------- Nutritional status:  The patient's BMI is: Body mass index is 24.8 kg/m. I agree with the assessment and plan as outlined ------------------------------------------------------------------------------------------------------------------------------------------ Cultures; Blood Cultures x 2 >> no growth to date  Urine Culture  >>> Klebsiella pneumoniae ------------------------------------------------------------------------------------------------------------------------------------------  DVT prophylaxis:  heparin  injection 5,000 Units Start: 02/20/24 2200   Code Status:   Code Status: Limited: Do not attempt resuscitation (DNR) -DNR-LIMITED -Do Not Intubate/DNI   Family Communication: No family member present at bedside-  -Advance care planning has been discussed.   Admission status:   Status is: Inpatient Remains inpatient appropriate because: Needing IV fluid, IV antibiotics   Disposition: From  - home             Planning for discharge in 1-2 days   Procedures:   No admission procedures for hospital encounter.   Antimicrobials:  Anti-infectives (From admission, onward)    Start     Dose/Rate Route Frequency Ordered Stop   02/21/24 1500  cefTRIAXone  (ROCEPHIN ) 1 g in sodium chloride  0.9 % 100 mL IVPB        1 g 200 mL/hr over 30 Minutes Intravenous Every 24 hours 02/20/24 1825     02/20/24 1545  cefTRIAXone  (ROCEPHIN ) 1 g in sodium chloride  0.9 % 100 mL IVPB        1 g 200 mL/hr over 30 Minutes Intravenous  Once 02/20/24 1536 02/20/24 1620        Medication:   amLODipine   5 mg Oral Daily   carvedilol   25 mg Oral BID WC   clopidogrel   75 mg Oral Daily   heparin   5,000 Units Subcutaneous Q8H   pantoprazole   40 mg Oral Daily   rosuvastatin   10 mg Oral QHS    acetaminophen  **OR** acetaminophen , ondansetron   **OR** ondansetron  (ZOFRAN ) IV, polyethylene glycol   Objective:   Vitals:   02/22/24 1454 02/22/24 2109 02/23/24 0521 02/23/24 0905  BP: (!) 116/53 (!) 117/41 (!) 127/45 (!) 133/49  Pulse: 60 64 66 65  Resp: 18 16 15    Temp: 98 F (36.7 C) 98 F (36.7 C) 98.3 F (36.8 C)   TempSrc: Oral Oral Oral   SpO2: 97% 92% 95%   Weight:      Height:        Intake/Output Summary (Last 24 hours) at 02/23/2024 1110 Last data filed at 02/23/2024 0900 Gross per 24 hour  Intake 1978.98 ml  Output --  Net 1978.98 ml   Filed Weights   02/20/24 1146 02/20/24 2014  Weight: 62.6 kg 61.5 kg     Physical examination:     General:  More awake, follows some commands, trying to verbalize her name  HEENT:  Normocephalic, PERRL, otherwise with in Normal limits   Neuro:  Limited exam due to previous stroke, aphasic with hard of hearing CNII-XII intact. , normal motor and sensation, reflexes intact   Lungs:   Clear to auscultation BL, Respirations unlabored,  No wheezes / crackles  Cardio:    S1/S2, RRR, No murmure, No Rubs or Gallops   Abdomen:  Soft, non-tender, bowel sounds active  all four quadrants, no guarding or peritoneal signs.  Muscular  skeletal:  Limited exam -severe global generalized weaknesses - in bed, able to move all 4 extremities,   2+ pulses,  symmetric, No pitting edema  Skin:  Dry, warm to touch, negative for any Rashes,  Wounds: Please see nursing documentation       ------------------------------------------------------------------------------------------------------------------------------------------    LABs:     Latest Ref Rng & Units 02/23/2024    3:49 AM 02/22/2024    4:57 AM 02/20/2024    2:01 PM  CBC  WBC 4.0 - 10.5 K/uL 6.0  6.1  4.8   Hemoglobin 12.0 - 15.0 g/dL 8.1  9.3  9.2   Hematocrit 36.0 - 46.0 % 26.4  29.2  29.3   Platelets 150 - 400 K/uL 133  167  167       Latest Ref Rng & Units 02/23/2024    3:49 AM 02/22/2024    4:57 AM 02/20/2024    2:01  PM  CMP  Glucose 70 - 99 mg/dL 83  83  889   BUN 8 - 23 mg/dL 35  33  51   Creatinine 0.44 - 1.00 mg/dL 7.47  7.52  6.85   Sodium 135 - 145 mmol/L 145  144  144   Potassium 3.5 - 5.1 mmol/L 4.0  4.1  3.9   Chloride 98 - 111 mmol/L 111  112  108   CO2 22 - 32 mmol/L 22  21  23    Calcium  8.9 - 10.3 mg/dL 8.0  8.3  9.1   Total Protein 6.5 - 8.1 g/dL   6.5   Total Bilirubin 0.0 - 1.2 mg/dL   0.8   Alkaline Phos 38 - 126 U/L   106   AST 15 - 41 U/L   16   ALT 0 - 44 U/L   13        Micro Results Recent Results (from the past 240 hours)  Urine Culture     Status: Abnormal   Collection Time: 02/20/24  1:34 PM   Specimen: Urine, Clean Catch  Result Value Ref Range Status   Specimen Description   Final    URINE, CLEAN CATCH Performed at [facility], [address], [place], [place] [address]    Special Requests   Final    NONE Performed at [facility], [address], [place], [place] [address]    Culture (A)  Final    >=100,000 COLONIES/mL KLEBSIELLA PNEUMONIAE 50,000 COLONIES/mL ESCHERICHIA COLI    Report Status 02/23/2024 FINAL  Final   Organism ID, Bacteria KLEBSIELLA PNEUMONIAE (A)  Final   Organism ID, Bacteria ESCHERICHIA COLI (A)  Final      Susceptibility   Escherichia coli - MIC*    AMPICILLIN >=32 RESISTANT Resistant     CEFAZOLIN  (URINE) Value in next row Sensitive      2 SENSITIVEThis is a modified FDA-approved test that has been validated and its performance characteristics determined by the reporting laboratory.  This laboratory is certified under the Clinical Laboratory Improvement Amendments CLIA as qualified to perform high complexity clinical laboratory testing.    CEFEPIME Value in next row Sensitive      2 SENSITIVEThis is a modified FDA-approved test that has been validated and its performance characteristics determined by the reporting laboratory.  This laboratory is certified under the Clinical Laboratory Improvement Amendments CLIA as qualified to  perform high complexity clinical laboratory testing.    ERTAPENEM  Value in next row  Sensitive      2 SENSITIVEThis is a modified FDA-approved test that has been validated and its performance characteristics determined by the reporting laboratory.  This laboratory is certified under the Clinical Laboratory Improvement Amendments CLIA as qualified to perform high complexity clinical laboratory testing.    CEFTRIAXONE  Value in next row Sensitive      2 SENSITIVEThis is a modified FDA-approved test that has been validated and its performance characteristics determined by the reporting laboratory.  This laboratory is certified under the Clinical Laboratory Improvement Amendments CLIA as qualified to perform high complexity clinical laboratory testing.    CIPROFLOXACIN  Value in next row Resistant      2 SENSITIVEThis is a modified FDA-approved test that has been validated and its performance characteristics determined by the reporting laboratory.  This laboratory is certified under the Clinical Laboratory Improvement Amendments CLIA as qualified to perform high complexity clinical laboratory testing.    GENTAMICIN Value in next row Sensitive      2 SENSITIVEThis is a modified FDA-approved test that has been validated and its performance characteristics determined by the reporting laboratory.  This laboratory is certified under the Clinical Laboratory Improvement Amendments CLIA as qualified to perform high complexity clinical laboratory testing.    NITROFURANTOIN Value in next row Sensitive      2 SENSITIVEThis is a modified FDA-approved test that has been validated and its performance characteristics determined by the reporting laboratory.  This laboratory is certified under the Clinical Laboratory Improvement Amendments CLIA as qualified to perform high complexity clinical laboratory testing.    TRIMETH /SULFA  Value in next row Sensitive      2 SENSITIVEThis is a modified FDA-approved test that has been  validated and its performance characteristics determined by the reporting laboratory.  This laboratory is certified under the Clinical Laboratory Improvement Amendments CLIA as qualified to perform high complexity clinical laboratory testing.    AMPICILLIN/SULBACTAM Value in next row Intermediate      2 SENSITIVEThis is a modified FDA-approved test that has been validated and its performance characteristics determined by the reporting laboratory.  This laboratory is certified under the Clinical Laboratory Improvement Amendments CLIA as qualified to perform high complexity clinical laboratory testing.    PIP/TAZO Value in next row Sensitive      <=4 SENSITIVEThis is a modified FDA-approved test that has been validated and its performance characteristics determined by the reporting laboratory.  This laboratory is certified under the Clinical Laboratory Improvement Amendments CLIA as qualified to perform high complexity clinical laboratory testing.    MEROPENEM Value in next row Sensitive      <=4 SENSITIVEThis is a modified FDA-approved test that has been validated and its performance characteristics determined by the reporting laboratory.  This laboratory is certified under the Clinical Laboratory Improvement Amendments CLIA as qualified to perform high complexity clinical laboratory testing.    * 50,000 COLONIES/mL ESCHERICHIA COLI   Klebsiella pneumoniae - MIC*    AMPICILLIN Value in next row Resistant      <=4 SENSITIVEThis is a modified FDA-approved test that has been validated and its performance characteristics determined by the reporting laboratory.  This laboratory is certified under the Clinical Laboratory Improvement Amendments CLIA as qualified to perform high complexity clinical laboratory testing.    CEFAZOLIN  (URINE) Value in next row Sensitive      2 SENSITIVEThis is a modified FDA-approved test that has been validated and its performance characteristics determined by the reporting  laboratory.  This laboratory is certified under the  Clinical Laboratory Improvement Amendments CLIA as qualified to perform high complexity clinical laboratory testing.    CEFEPIME Value in next row Sensitive      2 SENSITIVEThis is a modified FDA-approved test that has been validated and its performance characteristics determined by the reporting laboratory.  This laboratory is certified under the Clinical Laboratory Improvement Amendments CLIA as qualified to perform high complexity clinical laboratory testing.    ERTAPENEM  Value in next row Sensitive      2 SENSITIVEThis is a modified FDA-approved test that has been validated and its performance characteristics determined by the reporting laboratory.  This laboratory is certified under the Clinical Laboratory Improvement Amendments CLIA as qualified to perform high complexity clinical laboratory testing.    CEFTRIAXONE  Value in next row Sensitive      2 SENSITIVEThis is a modified FDA-approved test that has been validated and its performance characteristics determined by the reporting laboratory.  This laboratory is certified under the Clinical Laboratory Improvement Amendments CLIA as qualified to perform high complexity clinical laboratory testing.    CIPROFLOXACIN  Value in next row Sensitive      2 SENSITIVEThis is a modified FDA-approved test that has been validated and its performance characteristics determined by the reporting laboratory.  This laboratory is certified under the Clinical Laboratory Improvement Amendments CLIA as qualified to perform high complexity clinical laboratory testing.    GENTAMICIN Value in next row Sensitive      2 SENSITIVEThis is a modified FDA-approved test that has been validated and its performance characteristics determined by the reporting laboratory.  This laboratory is certified under the Clinical Laboratory Improvement Amendments CLIA as qualified to perform high complexity clinical laboratory testing.     NITROFURANTOIN Value in next row Sensitive      2 SENSITIVEThis is a modified FDA-approved test that has been validated and its performance characteristics determined by the reporting laboratory.  This laboratory is certified under the Clinical Laboratory Improvement Amendments CLIA as qualified to perform high complexity clinical laboratory testing.    TRIMETH /SULFA  Value in next row Sensitive      2 SENSITIVEThis is a modified FDA-approved test that has been validated and its performance characteristics determined by the reporting laboratory.  This laboratory is certified under the Clinical Laboratory Improvement Amendments CLIA as qualified to perform high complexity clinical laboratory testing.    AMPICILLIN/SULBACTAM Value in next row Sensitive      2 SENSITIVEThis is a modified FDA-approved test that has been validated and its performance characteristics determined by the reporting laboratory.  This laboratory is certified under the Clinical Laboratory Improvement Amendments CLIA as qualified to perform high complexity clinical laboratory testing.    PIP/TAZO Value in next row Sensitive      <=4 SENSITIVEThis is a modified FDA-approved test that has been validated and its performance characteristics determined by the reporting laboratory.  This laboratory is certified under the Clinical Laboratory Improvement Amendments CLIA as qualified to perform high complexity clinical laboratory testing.    MEROPENEM Value in next row Sensitive      <=4 SENSITIVEThis is a modified FDA-approved test that has been validated and its performance characteristics determined by the reporting laboratory.  This laboratory is certified under the Clinical Laboratory Improvement Amendments CLIA as qualified to perform high complexity clinical laboratory testing.    * >=100,000 COLONIES/mL KLEBSIELLA PNEUMONIAE  Blood culture (routine x 2)     Status: None (Preliminary result)   Collection Time: 02/20/24  2:24 PM    Specimen: BLOOD  Result Value Ref Range Status   Specimen Description BLOOD BLOOD LEFT ARM  Final   Special Requests   Final    BOTTLES DRAWN AEROBIC AND ANAEROBIC Blood Culture adequate volume   Culture   Final    NO GROWTH 3 DAYS Performed at [facility], [address], [place], [place] [address]    Report Status PENDING  Incomplete  Blood culture (routine x 2)     Status: None (Preliminary result)   Collection Time: 02/20/24  2:24 PM   Specimen: BLOOD  Result Value Ref Range Status   Specimen Description BLOOD BLOOD RIGHT HAND  Final   Special Requests   Final    BOTTLES DRAWN AEROBIC ONLY Blood Culture adequate volume   Culture   Final    NO GROWTH 3 DAYS Performed at [facility], [address] [place], [place] [address]    Report Status PENDING  Incomplete    Radiology Reports No results found.   SIGNED: Adriana DELENA Grams, MD, FHM. FAAFP. Jolynn Pack - [facility] Time spent - 55 min.  In seeing, evaluating and examining the patient. Reviewing medical records, labs, drawn plan of care. [facility],  Pager (please use [email or web address] to page/ text) Please use Epic Secure Chat for non-urgent communication (7AM-7PM)  If 7PM-7AM, please contact night-coverage [email or web address], 02/23/2024, 11:10 AM

## 2024-02-24 DIAGNOSIS — G9341 Metabolic encephalopathy: Secondary | ICD-10-CM | POA: Diagnosis not present

## 2024-02-24 LAB — BASIC METABOLIC PANEL WITH GFR
Anion gap: 9 (ref 5–15)
BUN: 34 mg/dL — ABNORMAL HIGH (ref 8–23)
CO2: 23 mmol/L (ref 22–32)
Calcium: 7.9 mg/dL — ABNORMAL LOW (ref 8.9–10.3)
Chloride: 111 mmol/L (ref 98–111)
Creatinine, Ser: 2.42 mg/dL — ABNORMAL HIGH (ref 0.44–1.00)
GFR, Estimated: 19 mL/min — ABNORMAL LOW (ref >60)
Glucose, Bld: 89 mg/dL (ref 70–99)
Potassium: 4 mmol/L (ref 3.5–5.1)
Sodium: 143 mmol/L (ref 135–145)

## 2024-02-24 LAB — CBC
HCT: 25 % — ABNORMAL LOW (ref 36.0–46.0)
Hemoglobin: 7.8 g/dL — ABNORMAL LOW (ref 12.0–15.0)
MCH: 27.8 pg (ref 26.0–34.0)
MCHC: 31.2 g/dL (ref 30.0–36.0)
MCV: 89 fL (ref 80.0–100.0)
Platelets: 122 K/uL — ABNORMAL LOW (ref 150–400)
RBC: 2.81 MIL/uL — ABNORMAL LOW (ref 3.87–5.11)
RDW: 13.4 % (ref 11.5–15.5)
WBC: 5.2 K/uL (ref 4.0–10.5)
nRBC: 0 % (ref 0.0–0.2)

## 2024-02-24 MED ORDER — CEPHALEXIN 500 MG PO CAPS
500.0000 mg | ORAL_CAPSULE | Freq: Two times a day (BID) | ORAL | Status: DC
  Administered 2024-02-24 – 2024-02-25 (×3): 500 mg via ORAL
  Filled 2024-02-24 (×3): qty 1

## 2024-02-24 NOTE — Plan of Care (Signed)
  Problem: Health Behavior/Discharge Planning: Goal: Ability to manage health-related needs will improve Outcome: Progressing   Problem: Clinical Measurements: Goal: Ability to maintain clinical measurements within normal limits will improve Outcome: Progressing Goal: Will remain free from infection Outcome: Progressing Goal: Diagnostic test results will improve Outcome: Progressing Goal: Respiratory complications will improve Outcome: Progressing Goal: Cardiovascular complication will be avoided Outcome: Progressing   Problem: Activity: Goal: Risk for activity intolerance will decrease Outcome: Progressing   Problem: Nutrition: Goal: Adequate nutrition will be maintained Outcome: Progressing   Problem: Coping: Goal: Level of anxiety will decrease Outcome: Progressing   Problem: Elimination: Goal: Will not experience complications related to urinary retention Outcome: Progressing   Problem: Safety: Goal: Ability to remain free from injury will improve Outcome: Progressing   Problem: Skin Integrity: Goal: Risk for impaired skin integrity will decrease Outcome: Progressing

## 2024-02-24 NOTE — Progress Notes (Signed)
 PROGRESS NOTE    Patient: [name]                            PCP: Duanne Butler DASEN, MD                    DOB: 07-13-1934            DOA: 02/20/2024 FMW:[id number]             DOS: 02/24/2024, 11:18 AM   LOS: 4 days   Date of Service: The patient was seen and examined on 02/24/2024  Subjective:   The patient was seen and examined this morning, much more awake alert, following command, chronic dysarthria, speech garbled. Hemodynamically stable  Brief Narrative:    [name] is a 89 y.o. female with medical history significant for CKD 3, hypertension, stroke right-sided deficits and aphasia, peptic ulcer disease. Patient was brought to the ED from nursing home with reports of generalized weakness, and not participating in activities as she would normally do-this was unusual for her.  Daughter reported that patient has aphasia, and this was usually consistent with her UTI infections.  On my evaluation, patient is awake and alert and talking but speech is incomprehensible.   ED Course: Temperature 97.1.  Heart rate 59-70, respiratory 12-17.  Blood pressure systolic 111-130.  O2 sats greater than 92% on room air. UA suggestive of UTI.  Head CT without contrast generalized cerebral atrophy with chronic white matter small ischemic vessel changes. IV ceftriaxone , 1 L bolus given.  Blood cultures obtained.      Assessment & Plan:   Principal Problem:   Acute metabolic encephalopathy Active Problems:   Acute kidney injury superimposed on CKD (HCC)   History of CVA (cerebrovascular accident)   Hypertension   Expressive aphasia   PUD (peptic ulcer disease)     Assessment and Plan: * Acute toxic metabolic encephalopathy -  SIRS -with source of infection UTI Toxic metabolic encephalopathy superimposed with UTI infection and underlying dementia, - Improved mentation, dysarthric at baseline -Hemodynamically stable Discussed with POA, patient has some ambulation,  speech always garbled post her stroke-this is possibly her baseline  - Remains pleasantly confused, otherwise comfortable awake cooperative in bed    -POA: Change in behavior, interaction, progressive generalized weakness Chronic aphasia right-sided deficits from prior stroke.   UA suggestive of UTI Lactic acid 0.7, WBC 4.8, 6.1, Head CT negative for acute abnormality.   - Continue with IV ceftriaxone  1 g daily>>> switching to p.o. Keflex  02/24/2024 -Urine cultures >> Klebsiella (resistant to ampicillin otherwise pansensitive)  - Chest x-ray mild vascular congestion, negative for any infiltrate  Acute kidney injury superimposed on CKD (HCC) Creatinine 3.14, recent baseline 2.4-2.5.  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 -Worsening kidney function Monitoring - Hold Valsartan  -Avoid nephrotoxins - Continue with IV maintenance fluids BUN 51, 33, GFR 14, 19, Lab Results  Component Value Date   CREATININE 2.42 (H) 02/24/2024   CREATININE 2.52 (H) 02/23/2024   CREATININE 2.47 (H) 02/22/2024     Hypertension Remains stable -Resume reducing Norvasc  to 5 mg daily, continue Coreg  at 25 mg discontinuing hydralazine  - Hold valsartan  with AKI  History of CVA (cerebrovascular accident)-with aphasia With baseline dysarthric/aphasic and right-sided deficits.   Nursing home resident. - Resume Plavix , Crestor   Acute on chronic anemia -anemia of chronic disease  - Monitoring H&H closely  - Mild drop in hemoglobin noted - Currently stable,  will transfuse if Hgb <7.0   Ethics: DNR/DNI- Called and discussed with POA [name], confirmed CODE STATUS, also updated regarding her medical issues, confirming baseline function.  And goals of care were discussed in detail.  Prognosis remain poor due to acute infection-multiple comorbidities including stroke, aphasia, immobility, poor p.o.  intake   --------------------------------------------------------------------------------------------------------------------------------------------- Nutritional status:  The patient's BMI is: Body mass index is 24.8 kg/m. I agree with the assessment and plan as outlined ------------------------------------------------------------------------------------------------------------------------------------------ Cultures; Blood Cultures x 2 >> no growth to date  Urine Culture  >>> Klebsiella pneumoniae ------------------------------------------------------------------------------------------------------------------------------------------  DVT prophylaxis:  heparin  injection 5,000 Units Start: 02/24/24 0600 Place and maintain sequential compression device Start: 02/23/24 1115   Code Status:   Code Status: Limited: Do not attempt resuscitation (DNR) -DNR-LIMITED -Do Not Intubate/DNI   Family Communication: Discussed with stepdaughter POA on the phone-updated -Advance care planning has been discussed.   Admission status:   Status is: Inpatient Remains inpatient appropriate because: Needing IV fluid, IV antibiotics   Disposition: From  - home             Planning for discharge back to SNF in a.m.  Procedures:   No admission procedures for hospital encounter.   Antimicrobials:  Anti-infectives (From admission, onward)    Start     Dose/Rate Route Frequency Ordered Stop   02/21/24 1500  cefTRIAXone  (ROCEPHIN ) 1 g in sodium chloride  0.9 % 100 mL IVPB        1 g 200 mL/hr over 30 Minutes Intravenous Every 24 hours 02/20/24 1825     02/20/24 1545  cefTRIAXone  (ROCEPHIN ) 1 g in sodium chloride  0.9 % 100 mL IVPB        1 g 200 mL/hr over 30 Minutes Intravenous  Once 02/20/24 1536 02/20/24 1620        Medication:   amLODipine   5 mg Oral Daily   carvedilol   25 mg Oral BID WC   clopidogrel   75 mg Oral Daily   docusate sodium   200 mg Oral Daily   ferrous sulfate   325 mg Oral BID  WC   heparin   5,000 Units Subcutaneous Q8H   pantoprazole   40 mg Oral Daily   rosuvastatin   10 mg Oral QHS    acetaminophen  **OR** acetaminophen , ondansetron  **OR** ondansetron  (ZOFRAN ) IV, polyethylene glycol   Objective:   Vitals:   02/23/24 1723 02/23/24 1725 02/23/24 1941 02/24/24 0500  BP: (!) 121/58  (!) 129/53 130/62  Pulse: (!) 58 60 60 66  Resp:   16 20  Temp:   97.7 F (36.5 C) 98.1 F (36.7 C)  TempSrc:   Oral Oral  SpO2:   92% 92%  Weight:      Height:        Intake/Output Summary (Last 24 hours) at 02/24/2024 1118 Last data filed at 02/24/2024 9066 Gross per 24 hour  Intake 2421.48 ml  Output 800 ml  Net 1621.48 ml   Filed Weights   02/20/24 1146 02/20/24 2014  Weight: 62.6 kg 61.5 kg     Physical examination:         General:  Much more awake, following command, speech garbled  HEENT:  Normocephalic, PERRL, otherwise with in Normal limits   Neuro:  Limited exam - awake alert, dysarthric -from previous CVA CNII-XII intact. , normal motor and sensation, reflexes intact   Lungs:   Clear to auscultation BL, Respirations unlabored,  No wheezes / crackles  Cardio:    S1/S2, RRR, No murmure, No  Rubs or Gallops   Abdomen:  Soft, non-tender, bowel sounds active all four quadrants, no guarding or peritoneal signs.  Muscular  skeletal:  Limited exam -in bed, chronic asymmetric weakness due to previous CVA  global generalized weaknesses - in bed, able to move all 4 extremities,   2+ pulses,  symmetric, No pitting edema  Skin:  Dry, warm to touch, negative for any Rashes,  Wounds: Please see nursing documentation            ------------------------------------------------------------------------------------------------------------------------------------------    LABs:     Latest Ref Rng & Units 02/24/2024    4:00 AM 02/23/2024    3:49 AM 02/22/2024    4:57 AM  CBC  WBC 4.0 - 10.5 K/uL 5.2  6.0  6.1   Hemoglobin 12.0 - 15.0 g/dL 7.8  8.1  9.3    Hematocrit 36.0 - 46.0 % 25.0  26.4  29.2   Platelets 150 - 400 K/uL 122  133  167       Latest Ref Rng & Units 02/24/2024    4:00 AM 02/23/2024    3:49 AM 02/22/2024    4:57 AM  CMP  Glucose 70 - 99 mg/dL 89  83  83   BUN 8 - 23 mg/dL 34  35  33   Creatinine 0.44 - 1.00 mg/dL 7.57  7.47  7.52   Sodium 135 - 145 mmol/L 143  145  144   Potassium 3.5 - 5.1 mmol/L 4.0  4.0  4.1   Chloride 98 - 111 mmol/L 111  111  112   CO2 22 - 32 mmol/L 23  22  21    Calcium  8.9 - 10.3 mg/dL 7.9  8.0  8.3        Micro Results Recent Results (from the past 240 hours)  Urine Culture     Status: Abnormal   Collection Time: 02/20/24  1:34 PM   Specimen: Urine, Clean Catch  Result Value Ref Range Status   Specimen Description   Final    URINE, CLEAN CATCH Performed at [facility], [address] [place], [place] [address]    Special Requests   Final    NONE Performed at [facility], [address], [place], [place] [address]    Culture (A)  Final    >=100,000 COLONIES/mL KLEBSIELLA PNEUMONIAE 50,000 COLONIES/mL ESCHERICHIA COLI    Report Status 02/23/2024 FINAL  Final   Organism ID, Bacteria KLEBSIELLA PNEUMONIAE (A)  Final   Organism ID, Bacteria ESCHERICHIA COLI (A)  Final      Susceptibility   Escherichia coli - MIC*    AMPICILLIN >=32 RESISTANT Resistant     CEFAZOLIN  (URINE) Value in next row Sensitive      2 SENSITIVEThis is a modified FDA-approved test that has been validated and its performance characteristics determined by the reporting laboratory.  This laboratory is certified under the Clinical Laboratory Improvement Amendments CLIA as qualified to perform high complexity clinical laboratory testing.    CEFEPIME Value in next row Sensitive      2 SENSITIVEThis is a modified FDA-approved test that has been validated and its performance characteristics determined by the reporting laboratory.  This laboratory is certified under the Clinical Laboratory Improvement Amendments CLIA as  qualified to perform high complexity clinical laboratory testing.    ERTAPENEM  Value in next row Sensitive      2 SENSITIVEThis is a modified FDA-approved test that has been validated and its performance characteristics determined by the reporting laboratory.  This laboratory is certified under the Clinical Laboratory Improvement Amendments CLIA as qualified to perform high complexity clinical laboratory testing.    CEFTRIAXONE  Value in next row Sensitive      2 SENSITIVEThis is a modified FDA-approved test that has been validated and its performance characteristics determined by the reporting laboratory.  This laboratory is certified under the Clinical Laboratory Improvement Amendments CLIA as qualified to perform high complexity clinical laboratory testing.    CIPROFLOXACIN  Value in next row Resistant      2 SENSITIVEThis is a modified FDA-approved test that has been validated and its performance characteristics determined by the reporting laboratory.  This laboratory is certified under the Clinical Laboratory Improvement Amendments CLIA as qualified to perform high complexity clinical laboratory testing.    GENTAMICIN Value in next row Sensitive      2 SENSITIVEThis is a modified FDA-approved test that has been validated and its performance characteristics determined by the reporting laboratory.  This laboratory is certified under the Clinical Laboratory Improvement Amendments CLIA as qualified to perform high complexity clinical laboratory testing.    NITROFURANTOIN Value in next row Sensitive      2 SENSITIVEThis is a modified FDA-approved test that has been validated and its performance characteristics determined by the reporting laboratory.  This laboratory is certified under the Clinical Laboratory Improvement Amendments CLIA as qualified to perform high complexity clinical laboratory testing.    TRIMETH /SULFA  Value in next row Sensitive      2 SENSITIVEThis is a modified FDA-approved test that  has been validated and its performance characteristics determined by the reporting laboratory.  This laboratory is certified under the Clinical Laboratory Improvement Amendments CLIA as qualified to perform high complexity clinical laboratory testing.    AMPICILLIN/SULBACTAM Value in next row Intermediate      2 SENSITIVEThis is a modified FDA-approved test that has been validated and its performance characteristics determined by the reporting laboratory.  This laboratory is certified under the Clinical Laboratory Improvement Amendments CLIA as qualified to perform high complexity clinical laboratory testing.    PIP/TAZO Value in next row Sensitive      <=4 SENSITIVEThis is a modified FDA-approved test that has been validated and its performance characteristics determined by the reporting laboratory.  This laboratory is certified under the Clinical Laboratory Improvement Amendments CLIA as qualified to perform high complexity clinical laboratory testing.    MEROPENEM Value in next row Sensitive      <=4 SENSITIVEThis is a modified FDA-approved test that has been validated and its performance characteristics determined by the reporting laboratory.  This laboratory is certified under the Clinical Laboratory Improvement Amendments CLIA as qualified to perform high complexity clinical laboratory testing.    * 50,000 COLONIES/mL ESCHERICHIA COLI   Klebsiella pneumoniae - MIC*    AMPICILLIN Value in next row Resistant      <=4 SENSITIVEThis is a modified FDA-approved test that has been validated and its performance characteristics determined by the reporting laboratory.  This laboratory is certified under the Clinical Laboratory Improvement Amendments CLIA as qualified to perform high complexity clinical laboratory testing.    CEFAZOLIN  (URINE) Value in next row Sensitive      2 SENSITIVEThis is a modified FDA-approved test that has been validated and its performance characteristics determined by the reporting  laboratory.  This laboratory is certified under the Clinical Laboratory Improvement Amendments CLIA as qualified to perform high complexity clinical laboratory testing.    CEFEPIME Value in next row Sensitive  2 SENSITIVEThis is a modified FDA-approved test that has been validated and its performance characteristics determined by the reporting laboratory.  This laboratory is certified under the Clinical Laboratory Improvement Amendments CLIA as qualified to perform high complexity clinical laboratory testing.    ERTAPENEM  Value in next row Sensitive      2 SENSITIVEThis is a modified FDA-approved test that has been validated and its performance characteristics determined by the reporting laboratory.  This laboratory is certified under the Clinical Laboratory Improvement Amendments CLIA as qualified to perform high complexity clinical laboratory testing.    CEFTRIAXONE  Value in next row Sensitive      2 SENSITIVEThis is a modified FDA-approved test that has been validated and its performance characteristics determined by the reporting laboratory.  This laboratory is certified under the Clinical Laboratory Improvement Amendments CLIA as qualified to perform high complexity clinical laboratory testing.    CIPROFLOXACIN  Value in next row Sensitive      2 SENSITIVEThis is a modified FDA-approved test that has been validated and its performance characteristics determined by the reporting laboratory.  This laboratory is certified under the Clinical Laboratory Improvement Amendments CLIA as qualified to perform high complexity clinical laboratory testing.    GENTAMICIN Value in next row Sensitive      2 SENSITIVEThis is a modified FDA-approved test that has been validated and its performance characteristics determined by the reporting laboratory.  This laboratory is certified under the Clinical Laboratory Improvement Amendments CLIA as qualified to perform high complexity clinical laboratory testing.     NITROFURANTOIN Value in next row Sensitive      2 SENSITIVEThis is a modified FDA-approved test that has been validated and its performance characteristics determined by the reporting laboratory.  This laboratory is certified under the Clinical Laboratory Improvement Amendments CLIA as qualified to perform high complexity clinical laboratory testing.    TRIMETH /SULFA  Value in next row Sensitive      2 SENSITIVEThis is a modified FDA-approved test that has been validated and its performance characteristics determined by the reporting laboratory.  This laboratory is certified under the Clinical Laboratory Improvement Amendments CLIA as qualified to perform high complexity clinical laboratory testing.    AMPICILLIN/SULBACTAM Value in next row Sensitive      2 SENSITIVEThis is a modified FDA-approved test that has been validated and its performance characteristics determined by the reporting laboratory.  This laboratory is certified under the Clinical Laboratory Improvement Amendments CLIA as qualified to perform high complexity clinical laboratory testing.    PIP/TAZO Value in next row Sensitive      <=4 SENSITIVEThis is a modified FDA-approved test that has been validated and its performance characteristics determined by the reporting laboratory.  This laboratory is certified under the Clinical Laboratory Improvement Amendments CLIA as qualified to perform high complexity clinical laboratory testing.    MEROPENEM Value in next row Sensitive      <=4 SENSITIVEThis is a modified FDA-approved test that has been validated and its performance characteristics determined by the reporting laboratory.  This laboratory is certified under the Clinical Laboratory Improvement Amendments CLIA as qualified to perform high complexity clinical laboratory testing.    * >=100,000 COLONIES/mL KLEBSIELLA PNEUMONIAE  Blood culture (routine x 2)     Status: None (Preliminary result)   Collection Time: 02/20/24  2:24 PM    Specimen: BLOOD  Result Value Ref Range Status   Specimen Description BLOOD BLOOD LEFT ARM  Final   Special Requests   Final    BOTTLES DRAWN  AEROBIC AND ANAEROBIC Blood Culture adequate volume   Culture   Final    NO GROWTH 4 DAYS Performed at [facility], [address] [place], [place] [address]    Report Status PENDING  Incomplete  Blood culture (routine x 2)     Status: None (Preliminary result)   Collection Time: 02/20/24  2:24 PM   Specimen: BLOOD  Result Value Ref Range Status   Specimen Description BLOOD BLOOD RIGHT HAND  Final   Special Requests   Final    BOTTLES DRAWN AEROBIC ONLY Blood Culture adequate volume   Culture   Final    NO GROWTH 4 DAYS Performed at [facility], [address], [place], [place] [address]    Report Status PENDING  Incomplete    Radiology Reports No results found.   SIGNED: Adriana DELENA Grams, MD, FHM. FAAFP. Jolynn Pack - [facility] Time spent - 55 min.  In seeing, evaluating and examining the patient. Reviewing medical records, labs, drawn plan of care. [facility],  Pager (please use [email or web address] to page/ text) Please use Epic Secure Chat for non-urgent communication (7AM-7PM)  If 7PM-7AM, please contact night-coverage [email or web address], 02/24/2024, 11:18 AM

## 2024-02-25 DIAGNOSIS — G9341 Metabolic encephalopathy: Secondary | ICD-10-CM | POA: Diagnosis not present

## 2024-02-25 LAB — BASIC METABOLIC PANEL WITH GFR
Anion gap: 6 (ref 5–15)
BUN: 36 mg/dL — ABNORMAL HIGH (ref 8–23)
CO2: 23 mmol/L (ref 22–32)
Calcium: 8 mg/dL — ABNORMAL LOW (ref 8.9–10.3)
Chloride: 112 mmol/L — ABNORMAL HIGH (ref 98–111)
Creatinine, Ser: 2.15 mg/dL — ABNORMAL HIGH (ref 0.44–1.00)
GFR, Estimated: 22 mL/min — ABNORMAL LOW (ref >60)
Glucose, Bld: 79 mg/dL (ref 70–99)
Potassium: 4 mmol/L (ref 3.5–5.1)
Sodium: 141 mmol/L (ref 135–145)

## 2024-02-25 LAB — CBC
HCT: 24.2 % — ABNORMAL LOW (ref 36.0–46.0)
Hemoglobin: 7.5 g/dL — ABNORMAL LOW (ref 12.0–15.0)
MCH: 27.5 pg (ref 26.0–34.0)
MCHC: 31 g/dL (ref 30.0–36.0)
MCV: 88.6 fL (ref 80.0–100.0)
Platelets: 116 K/uL — ABNORMAL LOW (ref 150–400)
RBC: 2.73 MIL/uL — ABNORMAL LOW (ref 3.87–5.11)
RDW: 13.2 % (ref 11.5–15.5)
WBC: 4.4 K/uL (ref 4.0–10.5)
nRBC: 0 % (ref 0.0–0.2)

## 2024-02-25 LAB — CULTURE, BLOOD (ROUTINE X 2)
Culture: NO GROWTH
Culture: NO GROWTH
Special Requests: ADEQUATE
Special Requests: ADEQUATE

## 2024-02-25 MED ORDER — AMLODIPINE BESYLATE 5 MG PO TABS: 5.0000 mg | ORAL_TABLET | Freq: Every day | ORAL | 1 refills | Status: DC

## 2024-02-25 MED ORDER — CEPHALEXIN 500 MG PO CAPS: 500.0000 mg | ORAL_CAPSULE | Freq: Two times a day (BID) | ORAL | 0 refills | Status: AC

## 2024-02-25 MED ORDER — LACTINEX PO CHEW: 1.0000 | CHEWABLE_TABLET | Freq: Three times a day (TID) | ORAL | 0 refills | Status: AC

## 2024-02-25 MED ORDER — NA FERRIC GLUC CPLX IN SUCROSE 12.5 MG/ML IV SOLN
125.0000 mg | Freq: Once | INTRAVENOUS | Status: AC
  Administered 2024-02-25: 125 mg via INTRAVENOUS
  Filled 2024-02-25: qty 10

## 2024-02-25 MED ORDER — FERROUS SULFATE 325 (65 FE) MG PO TABS: 325.0000 mg | ORAL_TABLET | Freq: Two times a day (BID) | ORAL | 0 refills | Status: DC

## 2024-02-25 NOTE — TOC Transition Note (Addendum)
 Transition of Care [facility]) - Discharge Note   Patient Details  Name: [name] MRN: [id number] Date of Birth: [birth year]  [facility] CM/SW Contact:  Lucie Lunger, LCSWA Phone Number: 02/25/2024, 10:38 AM  Clinical Narrative:    CSW updated that pt is medically stable for D/C back to [facility]. CSW spoke with Kyra at ALF who states they are ready for pt to return, D/C clinicals sent for review. Call back received, Kyra states they look good and they will come to transport pt back to facility. CSW spoke with Kyra about [facility] PT being recommended, they will arrange and have services set up to arrive in the home. CSW updated pts daughter to provide update on plan for D/C today. TOC singing off.   Final next level of care: Assisted Living Barriers to Discharge: Barriers Resolved   Patient Goals and CMS Choice Patient states their goals for this hospitalization and ongoing recovery are:: return home CMS Medicare.gov Compare Post Acute Care list provided to:: Patient Represenative (must comment) Choice offered to / list presented to : Adult Children      Discharge Placement                Patient to be transferred to facility by: Facility staff Name of family member notified: Daughter Patient and family notified of of transfer: 02/25/24  Discharge Plan and Services Additional resources added to the After Visit Summary for   In-house Referral: Clinical Social Work Discharge Planning Services: CM Consult Post Acute Care Choice: Nursing Home                               Social Drivers of Health (SDOH) Interventions SDOH Screenings   Food Insecurity: Patient Unable To Answer (02/21/2024)  Housing: Patient Unable To Answer (02/21/2024)  Transportation Needs: Patient Unable To Answer (02/21/2024)  Utilities: Patient Unable To Answer (02/21/2024)  Alcohol Screen: Low Risk  (01/10/2024)  Depression (PHQ2-9): Low Risk  (01/10/2024)  Financial  Resource Strain: Low Risk  (01/10/2024)  Physical Activity: Insufficiently Active (01/10/2024)  Social Connections: Patient Unable To Answer (02/21/2024)  Recent Concern: Social Connections - Moderately Isolated (01/10/2024)  Stress: No Stress Concern Present (01/10/2024)  Tobacco Use: Medium Risk (02/20/2024)  Health Literacy: Inadequate Health Literacy (01/10/2024)     Readmission Risk Interventions    02/21/2024   12:48 PM  Readmission Risk Prevention Plan  Transportation Screening Complete  PCP or Specialist Appt within 5-7 Days Complete  Home Care Screening Complete  Medication Review (RN CM) Complete

## 2024-02-25 NOTE — Plan of Care (Signed)
  Problem: Health Behavior/Discharge Planning: Goal: Ability to manage health-related needs will improve Outcome: Progressing   Problem: Clinical Measurements: Goal: Ability to maintain clinical measurements within normal limits will improve Outcome: Progressing Goal: Will remain free from infection Outcome: Progressing Goal: Diagnostic test results will improve Outcome: Progressing Goal: Respiratory complications will improve Outcome: Progressing Goal: Cardiovascular complication will be avoided Outcome: Progressing   Problem: Activity: Goal: Risk for activity intolerance will decrease Outcome: Progressing   Problem: Nutrition: Goal: Adequate nutrition will be maintained Outcome: Progressing   Problem: Coping: Goal: Level of anxiety will decrease Outcome: Progressing   Problem: Elimination: Goal: Will not experience complications related to urinary retention Outcome: Progressing   Problem: Safety: Goal: Ability to remain free from injury will improve Outcome: Progressing   Problem: Skin Integrity: Goal: Risk for impaired skin integrity will decrease Outcome: Progressing

## 2024-02-25 NOTE — [place] FL2 (Signed)
 [place]  MEDICAID FL2 LEVEL OF CARE FORM     IDENTIFICATION  Patient Name: [name] Birthdate: 1934/07/27 Sex: female Admission Date (Current Location): 02/20/2024  [facility] and [place] Number:  [employer] and Address:  [facility],  [address], [place] [address]      Provider Number: [id number]  Attending Physician Name and Address:  Willette Adriana LABOR, MD  Relative Name and Phone Number:  Loreli Sora (daughter)  [phone]    Current Level of Care: Hospital Recommended Level of Care: Assisted Living Facility Prior Approval Number:    Date Approved/Denied:   PASRR Number:    Discharge Plan: Other (Comment) (ALF, Long-term care)    Current Diagnoses: Patient Active Problem List   Diagnosis Date Noted   Acute metabolic encephalopathy 02/20/2024   Sepsis due to Escherichia coli (E. coli) -from urinary source--POA 12/21/2020   Hypoalbuminemia due to protein-calorie malnutrition (HCC) 12/20/2020   Obesity (BMI 30.0-34.9) 12/20/2020   CKD (chronic kidney disease), stage III (HCC) 12/20/2020   Prolonged QT interval 12/20/2020   Hyperlipidemia 12/20/2020   Presbycusis of both ears 09/28/2020   Impacted cerumen of left ear 09/28/2020   Schatzki's ring 05/08/2018   PUD (peptic ulcer disease) 05/08/2018   Duodenal ulcer 04/29/2018   Acute kidney injury superimposed on CKD (HCC) 04/26/2018   Dehydration 04/26/2018   Anemia, unspecified 04/26/2018   Altered mental state 04/26/2018   Leukocytosis 04/26/2018   Cerebrovascular disease 04/26/2018   Hypokalemia 04/26/2018   Painful orthopaedic hardware (HCC) 10/05/2014   Right hip pain 10/05/2014   Expressive aphasia    Acute blood loss anemia    Acute renal failure syndrome (HCC)    Closed right hip fracture (HCC) 06/12/2014   Hip fracture (HCC) 06/12/2014   UTI (urinary tract infection) 06/12/2014   Fall    Vasovagal syncope 05/15/2011   History of CVA (cerebrovascular accident)  05/15/2011   Hypertension 05/15/2011   LOWER LEG, ARTHRITIS, DEGEN./OSTEO 01/21/2009   JOINT EFFUSION, RIGHT KNEE 01/21/2009   KNEE PAIN 01/21/2009    Orientation RESPIRATION BLADDER Height & Weight     Self  Normal Incontinent Weight: 135 lb 9.3 oz (61.5 kg) Height:  5' 2 (157.5 cm)  BEHAVIORAL SYMPTOMS/MOOD NEUROLOGICAL BOWEL NUTRITION STATUS      Incontinent Diet (see dc summary)  AMBULATORY STATUS COMMUNICATION OF NEEDS Skin   Extensive Assist Verbally                         Personal Care Assistance Level of Assistance  Bathing, Feeding, Dressing Bathing Assistance: Maximum assistance Feeding assistance: Independent Dressing Assistance: Limited assistance     Functional Limitations Info  Sight, Hearing, Speech Sight Info: Adequate Hearing Info: Impaired Speech Info: Impaired    SPECIAL CARE FACTORS FREQUENCY  PT (By licensed PT)     PT Frequency: 3x weekly              Contractures Contractures Info: Not present    Additional Factors Info  Code Status, Allergies Code Status Info: Full Allergies Info: None known           Current Medications (02/25/2024):  This is the current hospital active medication list Current Facility-Administered Medications  Medication Dose Route Frequency Provider Last Rate Last Admin   acetaminophen  (TYLENOL ) tablet 650 mg  650 mg Oral Q6H PRN Emokpae, Ejiroghene E, MD       Or   acetaminophen  (TYLENOL ) suppository 650 mg  650  mg Rectal Q6H PRN Emokpae, Ejiroghene E, MD       amLODipine  (NORVASC ) tablet 5 mg  5 mg Oral Daily Shahmehdi, Seyed A, MD   5 mg at 02/24/24 9080   carvedilol  (COREG ) tablet 25 mg  25 mg Oral BID WC Emokpae, Ejiroghene E, MD   25 mg at 02/24/24 1711   cephALEXin  (KEFLEX ) capsule 500 mg  500 mg Oral Q12H Shahmehdi, Seyed A, MD   500 mg at 02/24/24 2300   clopidogrel  (PLAVIX ) tablet 75 mg  75 mg Oral Daily Emokpae, Ejiroghene E, MD   75 mg at 02/24/24 0900   docusate sodium  (COLACE) capsule 200 mg   200 mg Oral Daily Shahmehdi, Seyed A, MD   200 mg at 02/24/24 0917   ferric gluconate (FERRLECIT) 125 mg in sodium chloride  0.9 % 100 mL IVPB  125 mg Intravenous Once Shahmehdi, Seyed A, MD       ferrous sulfate  tablet 325 mg  325 mg Oral BID WC Shahmehdi, Seyed A, MD   325 mg at 02/24/24 1711   ondansetron  (ZOFRAN ) tablet 4 mg  4 mg Oral Q6H PRN Emokpae, Ejiroghene E, MD       Or   ondansetron  (ZOFRAN ) injection 4 mg  4 mg Intravenous Q6H PRN Emokpae, Ejiroghene E, MD       pantoprazole  (PROTONIX ) EC tablet 40 mg  40 mg Oral Daily Emokpae, Ejiroghene E, MD   40 mg at 02/24/24 0900   polyethylene glycol (MIRALAX  / GLYCOLAX ) packet 17 g  17 g Oral Daily PRN Emokpae, Ejiroghene E, MD       rosuvastatin  (CRESTOR ) tablet 10 mg  10 mg Oral QHS Emokpae, Ejiroghene E, MD   10 mg at 02/24/24 2300     Discharge Medications: Allergies as of 02/25/2024   No Known Allergies      Medication List     STOP taking these medications    hydrALAZINE  50 MG tablet Commonly known as: APRESOLINE    valsartan  40 MG tablet Commonly known as: DIOVAN        TAKE these medications    acetaminophen  500 MG tablet Commonly known as: TYLENOL  Take 500 mg by mouth every 6 (six) hours. Every 6 hours while awake.   amLODipine  5 MG tablet Commonly known as: NORVASC  Take 1 tablet (5 mg total) by mouth daily. What changed:  medication strength how much to take   calcitRIOL 0.25 MCG capsule Commonly known as: ROCALTROL Take 0.25 mcg by mouth every Monday, Wednesday, and Friday.   carvedilol  25 MG tablet Commonly known as: COREG  TAKE (1) TABLET BY MOUTH TWICE DAILY.   cephALEXin  500 MG capsule Commonly known as: KEFLEX  Take 1 capsule (500 mg total) by mouth every 12 (twelve) hours for 5 days.   clopidogrel  75 MG tablet Commonly known as: PLAVIX  TAKE (1) TABLET BY MOUTH ONCE DAILY.   dextromethorphan-guaiFENesin  30-600 MG 12hr tablet Commonly known as: MUCINEX  DM Take 1 tablet by mouth 2 (two)  times daily.   diclofenac Sodium 1 % Gel Commonly known as: VOLTAREN Apply 4 g topically 4 (four) times daily as needed (Affected knee for pain).   dorzolamide-timolol 2-0.5 % ophthalmic solution Commonly known as: COSOPT Place 1 drop into both eyes 2 (two) times daily.   ferrous sulfate  325 (65 FE) MG tablet Take 1 tablet (325 mg total) by mouth 2 (two) times daily with a meal.   lactobacillus acidophilus & bulgar chewable tablet Chew 1 tablet by mouth 3 (three) times daily with  meals for 10 days.   pantoprazole  40 MG tablet Commonly known as: PROTONIX  TAKE (1) TABLET BY MOUTH ONCE DAILY.   Rocklatan 0.02-0.005 % Soln Generic drug: Netarsudil-Latanoprost  Place 1 drop into both eyes daily.   rosuvastatin  10 MG tablet Commonly known as: CRESTOR  Take 10 mg by mouth at bedtime.   senna 8.6 MG tablet Commonly known as: SENOKOT Take 1 tablet by mouth at bedtime as needed for constipation.         Relevant Imaging Results:  Relevant Lab Results:   Additional Information SSN: [id number]  Lucie Lunger, LCSWA

## 2024-02-25 NOTE — Care Management Important Message (Signed)
 Important Message  Patient Details  Name: [name] MRN: [id number] Date of Birth: July 09, 1934   Important Message Given:  Yes - Medicare IM     Mavric Cortright L Shantera Monts 02/25/2024, 11:26 AM

## 2024-02-25 NOTE — Discharge Summary (Signed)
 Physician Discharge Summary   Patient: [name] MRN: [id number] DOB: [birth year]  Admit date:     02/20/2024  Discharge date: 02/25/24  Discharge Physician: Adriana DELENA Grams   PCP: Duanne Butler DASEN, MD   Recommendations at discharge:   Follow-up with PCP in 1 week CBC CMP, UA in 1 week results to PCP Continue oral hydration Continue with outpatient follow-up palliative care team  Discharge Diagnoses: Principal Problem:   Acute metabolic encephalopathy Active Problems:   Acute kidney injury superimposed on CKD (HCC)   History of CVA (cerebrovascular accident)   Hypertension   Expressive aphasia   PUD (peptic ulcer disease)  Resolved Problems:   * No resolved hospital problems. *  Hospital Course:  [name] is a 89 y.o. female with medical history significant for CKD 3, hypertension, stroke right-sided deficits and aphasia, peptic ulcer disease. Patient was brought to the ED from nursing home with reports of generalized weakness, and not participating in activities as she would normally do-this was unusual for her.  Daughter reported that patient has aphasia, and this was usually consistent with her UTI infections.  On my evaluation, patient is awake and alert and talking but speech is incomprehensible.   ED Course: Temperature 97.1.  Heart rate 59-70, respiratory 12-17.  Blood pressure systolic 111-130.  O2 sats greater than 92% on room air. UA suggestive of UTI.  Head CT without contrast generalized cerebral atrophy with chronic white matter small ischemic vessel changes. IV ceftriaxone , 1 L bolus given.  Blood cultures obtained.    Acute toxic metabolic encephalopathy -  SIRS -with source of infection UTI Toxic metabolic encephalopathy superimposed with UTI infection and underlying dementia, - Improved mentation, dysarthric at baseline -Hemodynamically stable  Discussed with POA, patient has some ambulation, speech always garbled post her stroke-this  is possibly her baseline   - Remains pleasantly confused, otherwise comfortable awake cooperative in bed    Head CT negative for acute abnorm   -POA: Change in behavior, interaction, progressive generalized weakness Chronic aphasia right-sided deficits from prior stroke.    Urine tract infection  UA suggestive of UTI Lactic acid 0.7, WBC 4.8, 6.1,    - Continue with IV ceftriaxone  1 g daily>>> switching to p.o. Keflex  02/24/2024 -Urine cultures >> Klebsiella (resistant to ampicillin otherwise pansensitive)   - Chest x-ray mild vascular congestion, negative for any infiltrate   Acute kidney injury superimposed on CKD (HCC) Creatinine 3.14, recent baseline 2.4-2.5.  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 -Worsening kidney function Monitoring - Hold Valsartan  -Avoid nephrotoxins - Continue with IV maintenance fluids BUN 51, 33, GFR 14, 19, Lab Results  Component Value Date   CREATININE 2.15 (H) 02/25/2024   CREATININE 2.42 (H) 02/24/2024   CREATININE 2.52 (H) 02/23/2024       Hypertension Remains stable -Resume reducing Norvasc  to 5 mg daily, continue Coreg  at 25 mg -  Discontinuing hydralazin and valsartan  with hypotension and AKI   History of CVA (cerebrovascular accident)-with aphasia With baseline dysarthric/aphasic and right-sided deficits.   Nursing home resident. - Resume Plavix , Crestor    Acute on chronic anemia -anemia of chronic disease  - Monitoring H&H closely  - Currently stable, will transfuse if Hgb <7.0    Ethics: DNR/DNI- Called and discussed with POA [name], confirmed CODE STATUS, also updated regarding her medical issues, confirming baseline function.  And goals of care were discussed in detail.   Prognosis remain poor due to acute infection-multiple comorbidities including stroke, aphasia, immobility, poor  p.o. intake      --------------------------------------------------------------------------------------------------------------------------------------------- Nutritional status:  The patient's BMI is: Body mass index is 24.8 kg/m. I agree with the assessment and plan as outlined ------------------------------------------------------------------------------------------------------------------------------------------ Cultures; Blood Cultures x 2 >> no growth to date  Urine Culture  >>> Klebsiella pneumoniae  Place and maintain sequential compression device Start: 02/23/24 1115     Code Status:   Code Status: Limited: Do not attempt resuscitation (DNR) -DNR-LIMITED -Do Not Intubate/DNI    Family Communication: Discussed with stepdaughter POA on the phone-updated          Disposition: Skilled nursing facility Diet recommendation:  Discharge Diet Orders (From admission, onward)     Start     Ordered   02/25/24 0000  Diet - low sodium heart healthy        02/25/24 0819           Regular diet DISCHARGE MEDICATION: Allergies as of 02/25/2024   No Known Allergies      Medication List     STOP taking these medications    hydrALAZINE  50 MG tablet Commonly known as: APRESOLINE    valsartan  40 MG tablet Commonly known as: DIOVAN        TAKE these medications    acetaminophen  500 MG tablet Commonly known as: TYLENOL  Take 500 mg by mouth every 6 (six) hours. Every 6 hours while awake.   amLODipine  5 MG tablet Commonly known as: NORVASC  Take 1 tablet (5 mg total) by mouth daily. What changed:  medication strength how much to take   calcitRIOL 0.25 MCG capsule Commonly known as: ROCALTROL Take 0.25 mcg by mouth every Monday, Wednesday, and Friday.   carvedilol  25 MG tablet Commonly known as: COREG  TAKE (1) TABLET BY MOUTH TWICE DAILY.   cephALEXin  500 MG capsule Commonly known as: KEFLEX  Take 1 capsule (500 mg total) by mouth every 12 (twelve) hours for 5 days.    clopidogrel  75 MG tablet Commonly known as: PLAVIX  TAKE (1) TABLET BY MOUTH ONCE DAILY.   dextromethorphan-guaiFENesin  30-600 MG 12hr tablet Commonly known as: MUCINEX  DM Take 1 tablet by mouth 2 (two) times daily.   diclofenac Sodium 1 % Gel Commonly known as: VOLTAREN Apply 4 g topically 4 (four) times daily as needed (Affected knee for pain).   dorzolamide-timolol 2-0.5 % ophthalmic solution Commonly known as: COSOPT Place 1 drop into both eyes 2 (two) times daily.   ferrous sulfate  325 (65 FE) MG tablet Take 1 tablet (325 mg total) by mouth 2 (two) times daily with a meal.   lactobacillus acidophilus & bulgar chewable tablet Chew 1 tablet by mouth 3 (three) times daily with meals for 10 days.   pantoprazole  40 MG tablet Commonly known as: PROTONIX  TAKE (1) TABLET BY MOUTH ONCE DAILY.   Rocklatan 0.02-0.005 % Soln Generic drug: Netarsudil-Latanoprost  Place 1 drop into both eyes daily.   rosuvastatin  10 MG tablet Commonly known as: CRESTOR  Take 10 mg by mouth at bedtime.   senna 8.6 MG tablet Commonly known as: SENOKOT Take 1 tablet by mouth at bedtime as needed for constipation.        Discharge Exam: Filed Weights   02/20/24 1146 02/20/24 2014  Weight: 62.6 kg 61.5 kg        General:  Awake alert oriented x 1, dysarthric at baseline;   HEENT:  Normocephalic, PERRL, otherwise with in Normal limits   Neuro:  CNII-XII intact. , normal motor and sensation, reflexes intact   Lungs:   Clear to auscultation BL,  Respirations unlabored,  No wheezes / crackles  Cardio:    S1/S2, RRR, No murmure, No Rubs or Gallops   Abdomen:  Soft, non-tender, bowel sounds active all four quadrants, no guarding or peritoneal signs.  Muscular  skeletal:  Limited exam - sever global generalized weaknesses - in bed, able to move all 4 extremities,   2+ pulses,  symmetric, No pitting edema  Skin:  Dry, warm to touch, negative for any Rashes,  Wounds: Please see nursing  documentation          Condition at discharge: fair  The results of significant diagnostics from this hospitalization (including imaging, microbiology, ancillary and laboratory) are listed below for reference.   Imaging Studies: DG CHEST PORT 1 VIEW Result Date: 02/20/2024 CLINICAL DATA:  Altered mental status EXAM: PORTABLE CHEST 1 VIEW COMPARISON:  12/19/2020 FINDINGS: Cardiac shadow is enlarged. Aortic calcifications are seen. The lungs are well aerated bilaterally. Mild central vascular congestion is noted without edema. No focal confluent infiltrate is seen. No bony abnormality is noted. IMPRESSION: Mild central vascular congestion is noted. Electronically Signed   By: Oneil Devonshire M.D.   On: 02/20/2024 19:05   CT Head Wo Contrast Result Date: 02/20/2024 CLINICAL DATA:  Altered mental status. EXAM: CT HEAD WITHOUT CONTRAST TECHNIQUE: Contiguous axial images were obtained from the base of the skull through the vertex without intravenous contrast. RADIATION DOSE REDUCTION: This exam was performed according to the departmental dose-optimization program which includes automated exposure control, adjustment of the mA and/or kV according to patient size and/or use of iterative reconstruction technique. COMPARISON:  May 04, 2023 FINDINGS: Brain: There is generalized cerebral atrophy with widening of the extra-axial spaces and ventricular dilatation. There are areas of decreased attenuation within the white matter tracts of the supratentorial brain, consistent with microvascular disease changes. Left basal ganglia, left temporal lobe, left frontal lobe and left parietal lobe encephalomalacia is seen within the left MCA territory. A chronic right basal ganglia lacunar infarct is noted. Vascular: Marked severity bilateral cavernous carotid artery calcification is noted. Skull: Normal. Negative for fracture or focal lesion. Sinuses/Orbits: A 10 mm posterior right maxillary sinus polyp versus mucous  retention cyst is seen. Other: None. IMPRESSION: 1. Generalized cerebral atrophy with chronic white matter small vessel ischemic changes. 2. Chronic left MCA territory infarct. 3. Chronic right basal ganglia lacunar infarct. 4. No acute intracranial abnormality. Electronically Signed   By: Suzen Dials M.D.   On: 02/20/2024 15:52    Microbiology: Results for orders placed or performed during the hospital encounter of 02/20/24  Urine Culture     Status: Abnormal   Collection Time: 02/20/24  1:34 PM   Specimen: Urine, Clean Catch  Result Value Ref Range Status   Specimen Description   Final    URINE, CLEAN CATCH Performed at [facility], [address], [place], [place] [address]    Special Requests   Final    NONE Performed at [facility], [address], [place], [place] [address]    Culture (A)  Final    >=100,000 COLONIES/mL KLEBSIELLA PNEUMONIAE 50,000 COLONIES/mL ESCHERICHIA COLI    Report Status 02/23/2024 FINAL  Final   Organism ID, Bacteria KLEBSIELLA PNEUMONIAE (A)  Final   Organism ID, Bacteria ESCHERICHIA COLI (A)  Final      Susceptibility   Escherichia coli - MIC*    AMPICILLIN >=32 RESISTANT Resistant     CEFAZOLIN  (URINE) Value in next row Sensitive      2 SENSITIVEThis is  a modified FDA-approved test that has been validated and its performance characteristics determined by the reporting laboratory.  This laboratory is certified under the Clinical Laboratory Improvement Amendments CLIA as qualified to perform high complexity clinical laboratory testing.    CEFEPIME Value in next row Sensitive      2 SENSITIVEThis is a modified FDA-approved test that has been validated and its performance characteristics determined by the reporting laboratory.  This laboratory is certified under the Clinical Laboratory Improvement Amendments CLIA as qualified to perform high complexity clinical laboratory testing.    ERTAPENEM  Value in next row Sensitive      2 SENSITIVEThis is a  modified FDA-approved test that has been validated and its performance characteristics determined by the reporting laboratory.  This laboratory is certified under the Clinical Laboratory Improvement Amendments CLIA as qualified to perform high complexity clinical laboratory testing.    CEFTRIAXONE  Value in next row Sensitive      2 SENSITIVEThis is a modified FDA-approved test that has been validated and its performance characteristics determined by the reporting laboratory.  This laboratory is certified under the Clinical Laboratory Improvement Amendments CLIA as qualified to perform high complexity clinical laboratory testing.    CIPROFLOXACIN  Value in next row Resistant      2 SENSITIVEThis is a modified FDA-approved test that has been validated and its performance characteristics determined by the reporting laboratory.  This laboratory is certified under the Clinical Laboratory Improvement Amendments CLIA as qualified to perform high complexity clinical laboratory testing.    GENTAMICIN Value in next row Sensitive      2 SENSITIVEThis is a modified FDA-approved test that has been validated and its performance characteristics determined by the reporting laboratory.  This laboratory is certified under the Clinical Laboratory Improvement Amendments CLIA as qualified to perform high complexity clinical laboratory testing.    NITROFURANTOIN Value in next row Sensitive      2 SENSITIVEThis is a modified FDA-approved test that has been validated and its performance characteristics determined by the reporting laboratory.  This laboratory is certified under the Clinical Laboratory Improvement Amendments CLIA as qualified to perform high complexity clinical laboratory testing.    TRIMETH /SULFA  Value in next row Sensitive      2 SENSITIVEThis is a modified FDA-approved test that has been validated and its performance characteristics determined by the reporting laboratory.  This laboratory is certified under the  Clinical Laboratory Improvement Amendments CLIA as qualified to perform high complexity clinical laboratory testing.    AMPICILLIN/SULBACTAM Value in next row Intermediate      2 SENSITIVEThis is a modified FDA-approved test that has been validated and its performance characteristics determined by the reporting laboratory.  This laboratory is certified under the Clinical Laboratory Improvement Amendments CLIA as qualified to perform high complexity clinical laboratory testing.    PIP/TAZO Value in next row Sensitive      <=4 SENSITIVEThis is a modified FDA-approved test that has been validated and its performance characteristics determined by the reporting laboratory.  This laboratory is certified under the Clinical Laboratory Improvement Amendments CLIA as qualified to perform high complexity clinical laboratory testing.    MEROPENEM Value in next row Sensitive      <=4 SENSITIVEThis is a modified FDA-approved test that has been validated and its performance characteristics determined by the reporting laboratory.  This laboratory is certified under the Clinical Laboratory Improvement Amendments CLIA as qualified to perform high complexity clinical laboratory testing.    * 50,000 COLONIES/mL ESCHERICHIA COLI  Klebsiella pneumoniae - MIC*    AMPICILLIN Value in next row Resistant      <=4 SENSITIVEThis is a modified FDA-approved test that has been validated and its performance characteristics determined by the reporting laboratory.  This laboratory is certified under the Clinical Laboratory Improvement Amendments CLIA as qualified to perform high complexity clinical laboratory testing.    CEFAZOLIN  (URINE) Value in next row Sensitive      2 SENSITIVEThis is a modified FDA-approved test that has been validated and its performance characteristics determined by the reporting laboratory.  This laboratory is certified under the Clinical Laboratory Improvement Amendments CLIA as qualified to perform high  complexity clinical laboratory testing.    CEFEPIME Value in next row Sensitive      2 SENSITIVEThis is a modified FDA-approved test that has been validated and its performance characteristics determined by the reporting laboratory.  This laboratory is certified under the Clinical Laboratory Improvement Amendments CLIA as qualified to perform high complexity clinical laboratory testing.    ERTAPENEM  Value in next row Sensitive      2 SENSITIVEThis is a modified FDA-approved test that has been validated and its performance characteristics determined by the reporting laboratory.  This laboratory is certified under the Clinical Laboratory Improvement Amendments CLIA as qualified to perform high complexity clinical laboratory testing.    CEFTRIAXONE  Value in next row Sensitive      2 SENSITIVEThis is a modified FDA-approved test that has been validated and its performance characteristics determined by the reporting laboratory.  This laboratory is certified under the Clinical Laboratory Improvement Amendments CLIA as qualified to perform high complexity clinical laboratory testing.    CIPROFLOXACIN  Value in next row Sensitive      2 SENSITIVEThis is a modified FDA-approved test that has been validated and its performance characteristics determined by the reporting laboratory.  This laboratory is certified under the Clinical Laboratory Improvement Amendments CLIA as qualified to perform high complexity clinical laboratory testing.    GENTAMICIN Value in next row Sensitive      2 SENSITIVEThis is a modified FDA-approved test that has been validated and its performance characteristics determined by the reporting laboratory.  This laboratory is certified under the Clinical Laboratory Improvement Amendments CLIA as qualified to perform high complexity clinical laboratory testing.    NITROFURANTOIN Value in next row Sensitive      2 SENSITIVEThis is a modified FDA-approved test that has been validated and its  performance characteristics determined by the reporting laboratory.  This laboratory is certified under the Clinical Laboratory Improvement Amendments CLIA as qualified to perform high complexity clinical laboratory testing.    TRIMETH /SULFA  Value in next row Sensitive      2 SENSITIVEThis is a modified FDA-approved test that has been validated and its performance characteristics determined by the reporting laboratory.  This laboratory is certified under the Clinical Laboratory Improvement Amendments CLIA as qualified to perform high complexity clinical laboratory testing.    AMPICILLIN/SULBACTAM Value in next row Sensitive      2 SENSITIVEThis is a modified FDA-approved test that has been validated and its performance characteristics determined by the reporting laboratory.  This laboratory is certified under the Clinical Laboratory Improvement Amendments CLIA as qualified to perform high complexity clinical laboratory testing.    PIP/TAZO Value in next row Sensitive      <=4 SENSITIVEThis is a modified FDA-approved test that has been validated and its performance characteristics determined by the reporting laboratory.  This laboratory is certified under the Clinical Laboratory  Improvement Amendments CLIA as qualified to perform high complexity clinical laboratory testing.    MEROPENEM Value in next row Sensitive      <=4 SENSITIVEThis is a modified FDA-approved test that has been validated and its performance characteristics determined by the reporting laboratory.  This laboratory is certified under the Clinical Laboratory Improvement Amendments CLIA as qualified to perform high complexity clinical laboratory testing.    * >=100,000 COLONIES/mL KLEBSIELLA PNEUMONIAE  Blood culture (routine x 2)     Status: None (Preliminary result)   Collection Time: 02/20/24  2:24 PM   Specimen: BLOOD  Result Value Ref Range Status   Specimen Description BLOOD BLOOD LEFT ARM  Final   Special Requests   Final     BOTTLES DRAWN AEROBIC AND ANAEROBIC Blood Culture adequate volume   Culture   Final    NO GROWTH 4 DAYS Performed at [facility], [address] [place], [place] [address]    Report Status PENDING  Incomplete  Blood culture (routine x 2)     Status: None (Preliminary result)   Collection Time: 02/20/24  2:24 PM   Specimen: BLOOD  Result Value Ref Range Status   Specimen Description BLOOD BLOOD RIGHT HAND  Final   Special Requests   Final    BOTTLES DRAWN AEROBIC ONLY Blood Culture adequate volume   Culture   Final    NO GROWTH 4 DAYS Performed at [facility], [address], [place], [place] [address]    Report Status PENDING  Incomplete    Labs: CBC: Recent Labs  Lab 02/20/24 1401 02/22/24 0457 02/23/24 0349 02/24/24 0400 02/25/24 0450  WBC 4.8 6.1 6.0 5.2 4.4  HGB 9.2* 9.3* 8.1* 7.8* 7.5*  HCT 29.3* 29.2* 26.4* 25.0* 24.2*  MCV 88.5 87.7 88.6 89.0 88.6  PLT 167 167 133* 122* 116*   Basic Metabolic Panel: Recent Labs  Lab 02/20/24 1401 02/22/24 0457 02/23/24 0349 02/24/24 0400 02/25/24 0450  NA 144 144 145 143 141  K 3.9 4.1 4.0 4.0 4.0  CL 108 112* 111 111 112*  CO2 23 21* 22 23 23   GLUCOSE 110* 83 83 89 79  BUN 51* 33* 35* 34* 36*  CREATININE 3.14* 2.47* 2.52* 2.42* 2.15*  CALCIUM  9.1 8.3* 8.0* 7.9* 8.0*   Liver Function Tests: Recent Labs  Lab 02/20/24 1401  AST 16  ALT 13  ALKPHOS 106  BILITOT 0.8  PROT 6.5  ALBUMIN 3.4*   CBG: No results for input(s): GLUCAP in the last 168 hours.  Discharge time spent: greater than 30 minutes.  Signed: Adriana DELENA Grams, MD [facility] 02/25/2024

## 2024-02-29 ENCOUNTER — Encounter: Payer: Self-pay | Admitting: Family Medicine

## 2024-02-29 ENCOUNTER — Ambulatory Visit (INDEPENDENT_AMBULATORY_CARE_PROVIDER_SITE_OTHER): Admitting: Family Medicine

## 2024-02-29 VITALS — BP 116/62 | HR 61 | Temp 97.1°F | Ht 62.0 in | Wt 139.0 lb

## 2024-02-29 DIAGNOSIS — N179 Acute kidney failure, unspecified: Secondary | ICD-10-CM

## 2024-02-29 DIAGNOSIS — Z8673 Personal history of transient ischemic attack (TIA), and cerebral infarction without residual deficits: Secondary | ICD-10-CM | POA: Diagnosis not present

## 2024-02-29 DIAGNOSIS — R4182 Altered mental status, unspecified: Secondary | ICD-10-CM | POA: Diagnosis not present

## 2024-02-29 DIAGNOSIS — Z23 Encounter for immunization: Secondary | ICD-10-CM | POA: Diagnosis not present

## 2024-02-29 DIAGNOSIS — I679 Cerebrovascular disease, unspecified: Secondary | ICD-10-CM

## 2024-02-29 LAB — CBC WITH DIFFERENTIAL/PLATELET
Absolute Lymphocytes: 1047 {cells}/uL (ref 850–3900)
Absolute Monocytes: 612 {cells}/uL (ref 200–950)
Basophils Absolute: 68 {cells}/uL (ref 0–200)
Basophils Relative: 1 %
Eosinophils Absolute: 211 {cells}/uL (ref 15–500)
Eosinophils Relative: 3.1 %
HCT: 28.6 % — ABNORMAL LOW (ref 35.0–45.0)
Hemoglobin: 9 g/dL — ABNORMAL LOW (ref 11.7–15.5)
MCH: 27.8 pg (ref 27.0–33.0)
MCHC: 31.5 g/dL — ABNORMAL LOW (ref 32.0–36.0)
MCV: 88.3 fL (ref 80.0–100.0)
MPV: 9.9 fL (ref 7.5–12.5)
Monocytes Relative: 9 %
Neutro Abs: 4862 {cells}/uL (ref 1500–7800)
Neutrophils Relative %: 71.5 %
Platelets: 271 Thousand/uL (ref 140–400)
RBC: 3.24 Million/uL — ABNORMAL LOW (ref 3.80–5.10)
RDW: 12.7 % (ref 11.0–15.0)
Total Lymphocyte: 15.4 %
WBC: 6.8 Thousand/uL (ref 3.8–10.8)

## 2024-02-29 LAB — COMPREHENSIVE METABOLIC PANEL WITH GFR
AG Ratio: 1.5 (calc) (ref 1.0–2.5)
ALT: 10 U/L (ref 6–29)
AST: 15 U/L (ref 10–35)
Albumin: 3.7 g/dL (ref 3.6–5.1)
Alkaline phosphatase (APISO): 108 U/L (ref 37–153)
BUN/Creatinine Ratio: 15 (calc) (ref 6–22)
BUN: 41 mg/dL — ABNORMAL HIGH (ref 7–25)
CO2: 25 mmol/L (ref 20–32)
Calcium: 9.1 mg/dL (ref 8.6–10.4)
Chloride: 109 mmol/L (ref 98–110)
Creat: 2.71 mg/dL — ABNORMAL HIGH (ref 0.60–0.95)
Globulin: 2.4 g/dL (ref 1.9–3.7)
Glucose, Bld: 90 mg/dL (ref 65–99)
Potassium: 4.5 mmol/L (ref 3.5–5.3)
Sodium: 143 mmol/L (ref 135–146)
Total Bilirubin: 0.3 mg/dL (ref 0.2–1.2)
Total Protein: 6.1 g/dL (ref 6.1–8.1)
eGFR: 17 mL/min/1.73m2 — ABNORMAL LOW (ref >=60)

## 2024-02-29 NOTE — Progress Notes (Signed)
 Subjective:    Patient ID: [name], female    DOB: [birth year], 89 y.o.   MRN: [id number]  Admit date:     02/20/2024  Discharge date: 02/25/24  Discharge Physician: [name]    PCP: Ruth Butler DASEN, MD    Recommendations at discharge:    Follow-up with PCP in 1 week CBC CMP, UA in 1 week results to PCP Continue oral hydration Continue with outpatient follow-up palliative care team   Discharge Diagnoses: Principal Problem:   Acute metabolic encephalopathy Active Problems:   Acute kidney injury superimposed on CKD (HCC)   History of CVA (cerebrovascular accident)   Hypertension   Expressive aphasia   PUD (peptic ulcer disease)   Resolved Problems:   * No resolved hospital problems. *   Hospital Course:  [name] is a 89 y.o. female with medical history significant for CKD 3, hypertension, stroke right-sided deficits and aphasia, peptic ulcer disease. Patient was brought to the ED from nursing home with reports of generalized weakness, and not participating in activities as she would normally do-this was unusual for her.  Daughter reported that patient has aphasia, and this was usually consistent with her UTI infections.  On my evaluation, patient is awake and alert and talking but speech is incomprehensible.   ED Course: Temperature 97.1.  Heart rate 59-70, respiratory 12-17.  Blood pressure systolic 111-130.  O2 sats greater than 92% on room air. UA suggestive of UTI.  Head CT without contrast generalized cerebral atrophy with chronic white matter small ischemic vessel changes. IV ceftriaxone , 1 L bolus given.  Blood cultures obtained.      Acute toxic metabolic encephalopathy -  SIRS -with source of infection UTI Toxic metabolic encephalopathy superimposed with UTI infection and underlying dementia, - Improved mentation, dysarthric at baseline -Hemodynamically stable   Discussed with POA, patient has some ambulation, speech always garbled  post her stroke-this is possibly her baseline   - Remains pleasantly confused, otherwise comfortable awake cooperative in bed    Head CT negative for acute abnorm   -POA: Change in behavior, interaction, progressive generalized weakness Chronic aphasia right-sided deficits from prior stroke.     Urine tract infection   UA suggestive of UTI Lactic acid 0.7, WBC 4.8, 6.1,     - Continue with IV ceftriaxone  1 g daily>>> switching to p.o. Keflex  02/24/2024 -Urine cultures >> Klebsiella (resistant to ampicillin otherwise pansensitive)   - Chest x-ray mild vascular congestion, negative for any infiltrate   Acute kidney injury superimposed on CKD (HCC) Creatinine 3.14, recent baseline 2.4-2.5.  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 -Worsening kidney function Monitoring - Hold Valsartan  -Avoid nephrotoxins - Continue with IV maintenance fluids BUN 51, 33, GFR 14, 19, Recent Labs       Lab Results  Component Value Date    CREATININE 2.15 (H) 02/25/2024    CREATININE 2.42 (H) 02/24/2024    CREATININE 2.52 (H) 02/23/2024          Hypertension Remains stable -Resume reducing Norvasc  to 5 mg daily, continue Coreg  at 25 mg -  Discontinuing hydralazin and valsartan  with hypotension and AKI   History of CVA (cerebrovascular accident)-with aphasia With baseline dysarthric/aphasic and right-sided deficits.   Nursing home resident. - Resume Plavix , Crestor    Acute on chronic anemia -anemia of chronic disease  - Monitoring H&H closely  - Currently stable, will transfuse if Hgb <7.0    Ethics: DNR/DNI- Called and discussed with POA [name], confirmed  CODE STATUS, also updated regarding her medical issues, confirming baseline function.  And goals of care were discussed in detail.   Prognosis remain poor due to acute infection-multiple comorbidities including stroke, aphasia, immobility, poor p.o. intake  02/29/24 Patient is here today with a caregiver from her nursing home.  She is smiling.   She appears happy.  She is nontoxic-appearing.  She does not appear to be in any pain.  However she is unable to converse with me due to her aphasia.  Her speech is garbled although she is smiling while she is talking.  Specifically she has no CVA tenderness on exam.  She has no tenderness to palpation in her abdomen.  She appears well-hydrated and well-nourished.  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    Constipation    Degenerative joint disease of knee, right    right knee   Hearing impaired person    wears hearing aids   High cholesterol    History of blood transfusion    Hx: UTI (urinary tract infection)    Hypertension    Kidney stones    Stroke [facility])    speech is effected slightly , right sided weakness  (has complete obstruction left carotid - per husband)   Past Surgical History:  Procedure Laterality Date   APPENDECTOMY     ESOPHAGEAL DILATION N/A 07/11/2018   Procedure: ESOPHAGEAL DILATION;  Surgeon: Ruth Claudis PENNER, MD;  Location: [facility];  Service: Endoscopy;  Laterality: N/A;   ESOPHAGOGASTRODUODENOSCOPY N/A 04/28/2018   Procedure: ESOPHAGOGASTRODUODENOSCOPY (EGD);  Surgeon: Ruth Claudis PENNER, MD;  Location: [facility];  Service: Endoscopy;  Laterality: N/A;   ESOPHAGOGASTRODUODENOSCOPY N/A 07/11/2018   Procedure: ESOPHAGOGASTRODUODENOSCOPY (EGD);  Surgeon: Ruth Claudis PENNER, MD;  Location: [facility];  Service: Endoscopy;  Laterality: N/A;  1200   HARDWARE REMOVAL Right 10/05/2014   Procedure: HARDWARE REMOVAL;  Surgeon: Ruth Gavel, MD;  Location: [facility] OR;  Service: Orthopedics;  Laterality: Right;   INTRAMEDULLARY (IM) NAIL INTERTROCHANTERIC Right 06/12/2014   Procedure: INTRAMEDULLARY (IM) NAIL INTERTROCHANTRIC RIGHT HIP;  Surgeon: Ruth LITTIE Gavel, MD;  Location: [facility] OR;  Service: Orthopedics;  Laterality: Right;   kidney stones     LITHOTRIPSY     PEG PLACEMENT     and removal   PEG TUBE REMOVAL      RADIOLOGY WITH ANESTHESIA  04/22/2012   Procedure: RADIOLOGY WITH ANESTHESIA;  Surgeon: Ruth MARLA Nash, MD;  Location: [facility] OR;  Service: Radiology;  Laterality: N/A;  VERTEBRAL ARTERY STENT PLACEMENT AND CEREBRAL ANGIOGRAM   TONSILLECTOMY     TYMPANOPLASTY     Current Outpatient Medications on File Prior to Visit  Medication Sig Dispense Refill   acetaminophen  (TYLENOL ) 500 MG tablet Take 500 mg by mouth every 6 (six) hours. Every 6 hours while awake.     amLODipine  (NORVASC ) 5 MG tablet Take 1 tablet (5 mg total) by mouth daily. 30 tablet 1   calcitRIOL (ROCALTROL) 0.25 MCG capsule Take 0.25 mcg by mouth every Monday, Wednesday, and Friday.     carvedilol  (COREG ) 25 MG tablet TAKE (1) TABLET BY MOUTH TWICE DAILY. 180 tablet 1   cephALEXin  (KEFLEX ) 500 MG capsule Take 1 capsule (500 mg total) by mouth every 12 (twelve) hours for 5 days. 10 capsule 0   clopidogrel  (PLAVIX ) 75 MG tablet TAKE (1) TABLET BY MOUTH ONCE DAILY. 90 tablet 1   dextromethorphan-guaiFENesin  (MUCINEX  DM)  30-600 MG 12hr tablet Take 1 tablet by mouth 2 (two) times daily. 60 tablet 1   diclofenac Sodium (VOLTAREN) 1 % GEL Apply 4 g topically 4 (four) times daily as needed (Affected knee for pain).     dorzolamide-timolol (COSOPT) 22.3-6.8 MG/ML ophthalmic solution Place 1 drop into both eyes 2 (two) times daily.     ferrous sulfate  325 (65 FE) MG tablet Take 1 tablet (325 mg total) by mouth 2 (two) times daily with a meal. 60 tablet 0   lactobacillus acidophilus & bulgar (LACTINEX) chewable tablet Chew 1 tablet by mouth 3 (three) times daily with meals for 10 days. 30 tablet 0   pantoprazole  (PROTONIX ) 40 MG tablet TAKE (1) TABLET BY MOUTH ONCE DAILY. 90 tablet 2   ROCKLATAN 0.02-0.005 % SOLN Place 1 drop into both eyes daily.     rosuvastatin  (CRESTOR ) 10 MG tablet Take 10 mg by mouth at bedtime.     senna (SENOKOT) 8.6 MG tablet Take 1 tablet by mouth at bedtime as needed for constipation.     No current  facility-administered medications on file prior to visit.   No Known Allergies  Social History   Socioeconomic History   Marital status: Widowed    Spouse name: Alm   Number of children: 0   Years of education: Not on file   Highest education level: Not on file  Occupational History    Comment: Retired Charity fundraiser  Tobacco Use   Smoking status: Former    Current packs/day: 0.00    Types: Cigarettes    Quit date: 10/02/1983    Years since quitting: 40.4   Smokeless tobacco: Never  Vaping Use   Vaping status: Never Used  Substance and Sexual Activity   Alcohol use: Not Currently   Drug use: No   Sexual activity: Not Currently  Other Topics Concern   Not on file  Social History Narrative   Resident at [facility]    2 step children.   Social Drivers of Corporate investment banker Strain: Low Risk  (01/10/2024)   Overall Financial Resource Strain (CARDIA)    Difficulty of Paying Living Expenses: Not hard at all  Food Insecurity: Patient Unable To Answer (02/21/2024)   Hunger Vital Sign    Worried About Running Out of Food in the Last Year: Patient unable to answer    Ran Out of Food in the Last Year: Patient unable to answer  Transportation Needs: Patient Unable To Answer (02/21/2024)   PRAPARE - Transportation    Lack of Transportation (Medical): Patient unable to answer    Lack of Transportation (Non-Medical): Patient unable to answer  Physical Activity: Insufficiently Active (01/10/2024)   Exercise Vital Sign    Days of Exercise per Week: 2 days    Minutes of Exercise per Session: 20 min  Stress: No Stress Concern Present (01/10/2024)   [employer] of Occupational Health - Occupational Stress Questionnaire    Feeling of Stress: Not at all  Social Connections: Patient Unable To Answer (02/21/2024)   Social Connection and Isolation Panel    Frequency of Communication with Friends and Family: Patient unable to answer    Frequency of Social Gatherings with  Friends and Family: Patient unable to answer    Attends Religious Services: Patient unable to answer    Active Member of Clubs or Organizations: Patient unable to answer    Attends Banker Meetings: Patient unable to answer    Marital Status: Patient unable  to answer  Recent Concern: Social Connections - Moderately Isolated (01/10/2024)   Social Connection and Isolation Panel    Frequency of Communication with Friends and Family: Never    Frequency of Social Gatherings with Friends and Family: Twice a week    Attends Religious Services: More than 4 times per year    Active Member of [employer] or Organizations: Yes    Attends Banker Meetings: More than 4 times per year    Marital Status: Widowed  Intimate Partner Violence: Patient Unable To Answer (02/21/2024)   Humiliation, Afraid, Rape, and Kick questionnaire    Fear of Current or Ex-Partner: Patient unable to answer    Emotionally Abused: Patient unable to answer    Physically Abused: Patient unable to answer    Sexually Abused: Patient unable to answer     Review of Systems  All other systems reviewed and are negative.      Objective:   Physical Exam Vitals reviewed.  Constitutional:      General: She is not in acute distress.    Appearance: Normal appearance. She is normal weight. She is not ill-appearing or toxic-appearing.  Cardiovascular:     Rate and Rhythm: Normal rate and regular rhythm.     Pulses: Normal pulses.     Heart sounds: Normal heart sounds. No murmur heard.    No friction rub. No gallop.  Pulmonary:     Effort: Pulmonary effort is normal. No respiratory distress.     Breath sounds: Normal breath sounds. No stridor. No wheezing, rhonchi or rales.  Abdominal:     General: Abdomen is flat. Bowel sounds are normal.     Palpations: Abdomen is soft.  Musculoskeletal:     Right lower leg: No edema.     Left lower leg: No edema.  Neurological:     Mental Status: She is alert.            Assessment & Plan:  Altered mental status, unspecified altered mental status type - Plan: CBC with Differential/Platelet, Comprehensive metabolic panel with GFR, Urine Culture, Urinalysis, Routine w reflex microscopic, Flu vaccine HIGH DOSE PF(Fluzone Trivalent)  Cerebrovascular disease - Plan: CBC with Differential/Platelet, Comprehensive metabolic panel with GFR, Urine Culture, Urinalysis, Routine w reflex microscopic, Flu vaccine HIGH DOSE PF(Fluzone Trivalent)  History of CVA (cerebrovascular accident) - Plan: CBC with Differential/Platelet, Comprehensive metabolic panel with GFR, Urine Culture, Urinalysis, Routine w reflex microscopic  Acute renal failure, unspecified acute renal failure type - Plan: CBC with Differential/Platelet, Comprehensive metabolic panel with GFR, Urine Culture, Urinalysis, Routine w reflex microscopic, Flu vaccine HIGH DOSE PF(Fluzone Trivalent)  Flu vaccine need - Plan: Flu vaccine HIGH DOSE PF(Fluzone Trivalent) Patient appears to be back to her baseline mental status.  She does not appear to be in any pain.  She appears to be well-nourished and well-hydrated.  I will check a CBC and a CMP today.  I will also check a urinalysis and a urine culture to ensure resolution of her urinary tract infection.  Patient received flu shot today

## 2024-03-01 LAB — URINALYSIS, ROUTINE W REFLEX MICROSCOPIC
Bacteria, UA: NONE SEEN /HPF
Bilirubin Urine: NEGATIVE
Glucose, UA: NEGATIVE
Hgb urine dipstick: NEGATIVE
Ketones, ur: NEGATIVE
Nitrite: NEGATIVE
RBC / HPF: NONE SEEN /HPF (ref 0–2)
Specific Gravity, Urine: 1.015 (ref 1.001–1.035)
Squamous Epithelial / HPF: NONE SEEN /HPF (ref <=5)
WBC, UA: >=60 /HPF — AB (ref 0–5)
pH: 5.5 (ref 5.0–8.0)

## 2024-03-01 LAB — URINE CULTURE
MICRO NUMBER:: 17022667
SPECIMEN QUALITY:: ADEQUATE

## 2024-03-01 LAB — MICROSCOPIC MESSAGE

## 2024-03-03 ENCOUNTER — Ambulatory Visit: Payer: Self-pay | Admitting: Family Medicine

## 2024-03-04 ENCOUNTER — Other Ambulatory Visit: Payer: Self-pay | Admitting: Family Medicine

## 2024-03-11 ENCOUNTER — Ambulatory Visit (INDEPENDENT_AMBULATORY_CARE_PROVIDER_SITE_OTHER): Admitting: Audiology

## 2024-03-11 ENCOUNTER — Institutional Professional Consult (permissible substitution) (INDEPENDENT_AMBULATORY_CARE_PROVIDER_SITE_OTHER): Admitting: Otolaryngology

## 2024-03-25 NOTE — Progress Notes (Signed)
   03/25/2024  Patient ID: [name], female   DOB: Aug 03, 1934, 89 y.o.   MRN: [id number]  Pharmacy Quality Measure Review  This patient is appearing on a report for being at risk of failing the adherence measure for hypertension (ACEi/ARB) medications this calendar year.   Medication: valsartan  40mg   Last fill date: 8/5 for 30 day supply  Stopped d/t kidney function, was on low dose 40mg . Likely will fail adherence measure, if 8/5 was true last fill date.  Lang Sieve, PharmD, BCGP Clinical Pharmacist  [phone]

## 2024-03-27 DIAGNOSIS — H6993 Unspecified Eustachian tube disorder, bilateral: Secondary | ICD-10-CM | POA: Diagnosis not present

## 2024-03-27 DIAGNOSIS — H906 Mixed conductive and sensorineural hearing loss, bilateral: Secondary | ICD-10-CM | POA: Diagnosis not present

## 2024-03-27 DIAGNOSIS — Z974 Presence of external hearing-aid: Secondary | ICD-10-CM | POA: Diagnosis not present

## 2024-03-27 DIAGNOSIS — H6123 Impacted cerumen, bilateral: Secondary | ICD-10-CM | POA: Diagnosis not present

## 2024-03-28 ENCOUNTER — Telehealth: Payer: Self-pay | Admitting: Family Medicine

## 2024-03-28 NOTE — Telephone Encounter (Signed)
 [place] dropped off FL2 forms for provider to complete and sign.   Requesting call when forms completed and ready for pickup.   Forms placed on desk of provider's nurse.

## 2024-04-03 NOTE — Progress Notes (Signed)
   04/03/2024  Patient ID: [name], female   DOB: [birth year], 89 y.o.   MRN: [id number]  Pharmacy Quality Measure Review  This patient is appearing on a report for being at risk of failing the adherence measure for hypertension (ACEi/ARB) medications this calendar year.   Medication: valsartan  40mg   Last fill date: August 2025 for 30 day supply  Medication had been d/c d/t decline in renal function. No longer taking, expected to fail the measure for 2025.   Lang Sieve, PharmD, BCGP Clinical Pharmacist  [phone]

## 2024-05-02 ENCOUNTER — Other Ambulatory Visit: Payer: Self-pay | Admitting: Family Medicine

## 2024-07-07 ENCOUNTER — Other Ambulatory Visit: Payer: Self-pay | Admitting: Family Medicine

## 2025-01-15 ENCOUNTER — Encounter
